# Patient Record
Sex: Female | Born: 1945 | Hispanic: No | Marital: Married | Smoking: Never smoker
Health system: Southern US, Community
[De-identification: ages and names within clinical notes are randomized; demographics above are authoritative.]

## PROBLEM LIST (undated history)

## (undated) DIAGNOSIS — J45909 Unspecified asthma, uncomplicated: Secondary | ICD-10-CM

## (undated) DIAGNOSIS — E119 Type 2 diabetes mellitus without complications: Secondary | ICD-10-CM

## (undated) DIAGNOSIS — E871 Hypo-osmolality and hyponatremia: Secondary | ICD-10-CM

## (undated) DIAGNOSIS — J449 Chronic obstructive pulmonary disease, unspecified: Secondary | ICD-10-CM

## (undated) DIAGNOSIS — D649 Anemia, unspecified: Secondary | ICD-10-CM

## (undated) DIAGNOSIS — I517 Cardiomegaly: Secondary | ICD-10-CM

## (undated) DIAGNOSIS — I10 Essential (primary) hypertension: Secondary | ICD-10-CM

## (undated) HISTORY — DX: Chronic obstructive pulmonary disease, unspecified: J44.9

## (undated) HISTORY — PX: TUBAL LIGATION: SHX77

## (undated) HISTORY — DX: Anemia, unspecified: D64.9

---

## 2012-12-24 ENCOUNTER — Inpatient Hospital Stay: Payer: Self-pay | Admitting: Internal Medicine

## 2012-12-24 LAB — BASIC METABOLIC PANEL
BUN: 32 mg/dL — ABNORMAL HIGH (ref 7–18)
BUN: 32 mg/dL — ABNORMAL HIGH (ref 7–18)
Calcium, Total: 8.5 mg/dL (ref 8.5–10.1)
Calcium, Total: 8 mg/dL — ABNORMAL LOW (ref 8.5–10.1)
Chloride: 91 mmol/L — ABNORMAL LOW (ref 98–107)
Chloride: 90 mmol/L — ABNORMAL LOW (ref 98–107)
Co2: 19 mmol/L — ABNORMAL LOW (ref 21–32)
Creatinine: 1.48 mg/dL — ABNORMAL HIGH (ref 0.60–1.30)
Creatinine: 1.49 mg/dL — ABNORMAL HIGH (ref 0.60–1.30)
EGFR (Non-African Amer.): 36 — ABNORMAL LOW
Potassium: 5.7 mmol/L — ABNORMAL HIGH (ref 3.5–5.1)
Sodium: 119 mmol/L — CL (ref 136–145)
Sodium: 117 mmol/L — CL (ref 136–145)

## 2012-12-24 LAB — CBC
HCT: 35.7 % (ref 35.0–47.0)
HGB: 12.1 g/dL (ref 12.0–16.0)
MCHC: 33.9 g/dL (ref 32.0–36.0)
Platelet: 294 10*3/uL (ref 150–440)
RBC: 4.57 10*6/uL (ref 3.80–5.20)
RDW: 13.8 % (ref 11.5–14.5)
WBC: 11.3 10*3/uL — ABNORMAL HIGH (ref 3.6–11.0)

## 2012-12-24 LAB — CK TOTAL AND CKMB (NOT AT ARMC): CK-MB: 26 ng/mL — ABNORMAL HIGH (ref 0.5–3.6)

## 2012-12-24 LAB — PRO B NATRIURETIC PEPTIDE: B-Type Natriuretic Peptide: 97 pg/mL (ref 0–125)

## 2012-12-25 LAB — CHLORIDE, URINE, RANDOM: Chloride, Urine Random: 30 mmol/L — ABNORMAL LOW (ref 55–125)

## 2012-12-25 LAB — BASIC METABOLIC PANEL
Anion Gap: 8 (ref 7–16)
BUN: 32 mg/dL — ABNORMAL HIGH (ref 7–18)
Creatinine: 1.56 mg/dL — ABNORMAL HIGH (ref 0.60–1.30)
EGFR (African American): 39 — ABNORMAL LOW
EGFR (Non-African Amer.): 34 — ABNORMAL LOW
Potassium: 5.7 mmol/L — ABNORMAL HIGH (ref 3.5–5.1)
Sodium: 118 mmol/L — CL (ref 136–145)

## 2012-12-25 LAB — TSH: Thyroid Stimulating Horm: 1.15 u[IU]/mL

## 2012-12-25 LAB — CK TOTAL AND CKMB (NOT AT ARMC)
CK, Total: 2984 U/L — ABNORMAL HIGH (ref 21–215)
CK-MB: 39.9 ng/mL — ABNORMAL HIGH (ref 0.5–3.6)

## 2012-12-25 LAB — URINALYSIS, COMPLETE
Bilirubin,UR: NEGATIVE
Ketone: NEGATIVE
RBC,UR: <1 /HPF (ref 0–5)
WBC UR: NONE SEEN /HPF (ref 0–5)

## 2012-12-25 LAB — LIPID PANEL
HDL Cholesterol: 26 mg/dL — ABNORMAL LOW (ref 40–60)
Ldl Cholesterol, Calc: 36 mg/dL (ref 0–100)
VLDL Cholesterol, Calc: 29 mg/dL (ref 5–40)

## 2012-12-25 LAB — CBC WITH DIFFERENTIAL/PLATELET
Eosinophil #: 0 10*3/uL (ref 0.0–0.7)
HCT: 29.6 % — ABNORMAL LOW (ref 35.0–47.0)
Lymphocyte %: 11.5 %
MCHC: 32 g/dL (ref 32.0–36.0)
Neutrophil #: 5 10*3/uL (ref 1.4–6.5)
Neutrophil %: 85.9 %
RBC: 3.77 10*6/uL — ABNORMAL LOW (ref 3.80–5.20)

## 2012-12-25 LAB — HEMOGLOBIN A1C: Hemoglobin A1C: 7.9 % — ABNORMAL HIGH (ref 4.2–6.3)

## 2012-12-25 LAB — PROTEIN / CREATININE RATIO, URINE
Creatinine, Urine: 31.2 mg/dL (ref 30.0–125.0)
Protein, Random Urine: 6 mg/dL (ref 0–12)

## 2012-12-25 LAB — CREATININE, URINE, RANDOM: Creatinine, Urine Random: 23.5 mg/dL — ABNORMAL LOW (ref 30.0–125.0)

## 2012-12-25 LAB — TROPONIN I: Troponin-I: <0.02 ng/mL

## 2012-12-25 LAB — SODIUM, URINE, RANDOM: Sodium, Urine Random: 26 mmol/L (ref 20–110)

## 2012-12-26 LAB — BASIC METABOLIC PANEL
BUN: 24 mg/dL — ABNORMAL HIGH (ref 7–18)
Calcium, Total: 6.9 mg/dL — CL (ref 8.5–10.1)
Co2: 16 mmol/L — ABNORMAL LOW (ref 21–32)
EGFR (African American): 56 — ABNORMAL LOW
Glucose: 389 mg/dL — ABNORMAL HIGH (ref 65–99)
Osmolality: 287 (ref 275–301)
Potassium: 4.7 mmol/L (ref 3.5–5.1)
Sodium: 133 mmol/L — ABNORMAL LOW (ref 136–145)

## 2012-12-29 LAB — CULTURE, BLOOD (SINGLE)

## 2012-12-31 LAB — CULTURE, BLOOD (SINGLE)

## 2014-10-01 NOTE — Discharge Summary (Signed)
PATIENT NAMEEEVA, Whitney Fleming MR#:  [id number] DATE OF BIRTH:  06/23/1945  DATE OF ADMISSION:  12/24/2012 DATE OF DISCHARGE:  12/27/2012  DISCHARGE DIAGNOSES:  1. Acute respiratory failure.  2. Acute tracheobronchitis.  3. Hyponatremia.  4. Acute renal failure.  5. Hyperkalemia.  6. Diabetes mellitus.  7. Hypertension.  8. Metabolic acidosis.   CONSULTANTS: Dr. Mortimer Fries of pulmonology and Dr. Candiss Norse of nephrology.   IMAGING STUDIES DONE: Include a CT scan of the chest without contrast showed interstitial fibrosis, mild bronchiectasis in right middle lobe.   Chest x-ray, PA and lateral, showed interstitial markings with COPD. No focal pneumonia.   ADMITTING HISTORY AND PHYSICAL: Please see detailed H and P dictated by me on 12/24/2012. In brief, 69 year old female patient with history of hypertension and diabetes presented to the hospital complaining of shortness of breath. The patient was found to have acute bronchitis with acute respiratory failure. Admitted to the hospitalist service.   HOSPITAL COURSE:  Acute bronchitis. The patient had acute tracheobronchitis. Was started on nebulizers, steroids, oxygen support with which she improved well and by the day of discharge on lung examination, the patient does not have any wheezing and is saturating 94% on room air. With the patient's history of tuberculosis, she had AFB ordered but with no sputum could not obtained. Dr. Mortimer Fries of pulmonology was consulted who saw the patient. Thought tuberculosis was extremely unlikely. A QuantiFERON the test has been ordered as outpatient as this is only done once a week in [facility].   The patient's blood sugar were severely high secondary to IV steroids. She is being transitioned to oral prednisone which should improve her blood sugars. Her home medication was continued. HbA1c was well controlled at 6.9.   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   The patient during her hospital stay, secondary to her concerns with medical insurance, has refused multiple medications. The patient was supposed to get an echocardiogram which she refused on the day of discharge. The patient has requested to be discharged back home. She would like to travel back to [place] as soon as possible and is being discharged home. I have discussed the case with the patient and her daughter at length, to follow up with local physician and they have mentioned they will follow up with Dr. Humphrey Rolls at [facility]. She has been given a lab slip to check a repeat BMP and QuantiFERON in 4 days after discharge.   DISCHARGE MEDICATIONS: Include: 1. Prednisone taper over 12 days.  2. Metformin 500 mg oral 2 times a day.  3. Atorvastatin 20 mg oral once a day.  4. Glimepiride 2 mg oral 2 times a day.  5. Aspirin 81 mg oral once a day.  6. Combivent Respimat 1 puff inhaled 4 times a day.  7. Albuterol 2 puffs inhaled every 4 hours as needed.  8. Levaquin 750 mg oral once a day for 5 days.  9. Lisinopril 10 mg oral once a day.   DISCHARGE INSTRUCTIONS: Low-sodium carbohydrate-controlled diet. Activity as tolerated. Follow up with [facility], Dr. Humphrey Rolls, in 1 week. The patient has been advised to return to the Emergency Room if her breathing worsens or has any fever.   Time spent on day of discharge in discharge activity was 49 minutes.   ____________________________ Leia Alf Dazani Norby, MD srs:gb D: 12/29/2012 16:27:56 ET T: 12/30/2012 04:35:14 ET JOB#:  [id number]  cc: Whitney Codd R. Laylah Riga, MD, <Dictator> Dr. Humphrey Rolls, [facility] Neita Carp MD ELECTRONICALLY SIGNED 12/30/2012 15:44

## 2014-10-01 NOTE — H&P (Signed)
PATIENT NAMEANWITA, Whitney Fleming MR#:  [id number] DATE OF BIRTH:  06/23/1945  DATE OF ADMISSION:  12/24/2012  PRIMARY CARE PHYSICIAN:  None.   HISTORY OF PRESENT ILLNESS:  A 69 year old [place] female patient, who recently traveled here a week back, and history of hypertension, diabetes, cardiomegaly and tuberculosis, presents to the Emergency Room complaining of 3 days of worsening shortness of breath, cough with clear white sputum. The patient has history of TB, was treated for one year in 2006. The patient just felt weak back then, and minimal shortness of breath, and was diagnosed with TB at that time. Presently she feels worse and different from then. She did have a case of severe bronchitis 5 years back, which resolved well. The patient does not have any congestive heart failure. She mentions she had cardiomegaly, chose not to get that treated.   She has significant diaphoresis at this time, complains of some chest tightness on coughing, and pleuritic chest pain in the lower part of the chest.   In the Emergency Room, the patient's chest x-ray shows changes of COPD, bilateral basilar atelectasis versus infiltrate. Sodium is significantly low at 117, creatinine 1.49, potassium 5.5, and patient is being admitted to the hospitalist service.  PAST MEDICAL HISTORY:   1.  Hypertension.  2.  Diabetes mellitus type 2.  3.  Cardiomegaly.  4.  TB.    PAST SURGICAL HISTORY:  None.   FAMILY HISTORY:  Family history of  TB.    SOCIAL HISTORY:  The patient does not smoke. No alcohol. No illicit drugs. Traveled to [place] from [place] one week back. She used to cook on wooden stoves many years back.   CODE STATUS:  FULL CODE.   REVIEW OF SYSTEMS:   CONSTITUTIONAL:  No fever, but complains of fatigue and weakness.  EYES:  No blurred vision, pain or redness.  EARS, NOSE, THROAT:   No tinnitus, ear pain, hearing loss.  RESPIRATORY:  Has cough, white sputum.  CARDIOVASCULAR:  Has pleuritic chest pain. No edema.   GASTROINTESTINAL:  No nausea, vomiting, diarrhea, abdominal pain.  GENITOURINARY:  No dysuria, hematuria, frequency.  ENDOCRINE:  No polyuria, nocturia, thyroid problems. HEMATOLOGY/LYMPHATIC:  No anemia, easy bruising, bleeding.  INTEGUMENTARY:  No acne, rash, lesions.  MUSCULOSKELETAL:  No back pain, arthritis.  NEUROLOGIC:  No focal numbness, weakness, dysarthria, seizures.  PSYCHIATRIC:  No anxiety or depression.   ALLERGIES:  No known drug allergies.   HOME MEDICATIONS INCLUDE: 1.  Atorvastatin 20 mg oral daily. 2.  Glimepiride  2 mg oral once a day.  3.  Hydrochlorothiazide/telmisartan 12.5/40, 1 tablet oral once a day.  4.  Metformin 500 mg oral 2 times a day.   PHYSICAL EXAMINATION: VITAL SIGNS: Temperature 98.3, pulse of 106, respirations 24, blood pressure 127/56, saturating 96% on room air.  GENERAL:  Morbidly obese female patient lying in bed in significant respiratory distress, with 1 to 2 word conversational dyspnea and continuous coughing.  PSYCHIATRIC:  Alert, oriented x 3, anxious.  HEENT: Atraumatic, normocephalic. Oral mucosa dry and pink. No oral ulcers or thrush. External ears and nose normal. No pallor. No icterus. Pupils are  equal and react to light.  NECK:  Supple. No thyromegaly. No palpable lymph nodes. Trachea midline. No carotid bruit, JVD.  CARDIOVASCULAR: S1, S2, tachycardic, without any murmurs. Peripheral pulses 2+. No edema.  RESPIRATORY:  Bilateral coarse inspiratory and expiratory breath sounds with wheezing. Good air entry on both sides.  GASTROINTESTINAL: Soft abdomen, nontender. Bowel sounds  present. No hepatosplenomegaly palpable.  SKIN:  Warm and dry. No petechiae, rash, ulcers.  MUSCULOSKELETAL:  No joint swelling, redness, effusion of the large joints. Normal muscle tone.  NEUROLOGICAL:  Motor strength 5/5 in upper and lower extremities. Sensation is intact all over. Cranial nerves II through XII intact.  LYMPHATIC:  No cervical,  supraclavicular lymphadenopathy.   LAB STUDIES: Show glucose of 200, BNP of 97, BUN 32, creatinine 1.49, sodium 117, potassium 5.5, chloride 90, bicarb of 20, GFR 35. Troponin less than 0.02. WBC 11.3, hemoglobin 12.3, platelets of 294, MCV 78, lactic acid 1.1.   Chest x-ray shows COPD with bilateral basilar atelectasis versus infiltrate. No pulmonary edema.   EKG shows sinus tachycardia at rate of 112, with no ST-T wave changes. Poor R-wave progression.   ASSESSMENT AND PLAN: 1.  Acute bronchitis with significant conversational dyspnea and cough. The patient has had multiple breathing treatments and IV steroids, and still continues to be severely short of breath, is extremely sick and can decompensate. Will admit the patient for IV steroids, nebulizers and antibiotics with Levaquin. Chest x-ray shows some basilar atelectasis versus infiltrate. Will get blood cultures and sputum cultures. The patient does have history of tuberculosis, and AFB smears have been ordered from the Emergency Room. The patient will be in isolation for TB, although there is low suspicion at this time, considering no focal findings on x-ray, no fever, and possibility of bilateral community-acquired pneumonia.   2.  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 The patient seems euvolemic at this time. Will get urine sodium, urine creatinine. Will consult Nephrology for further help. Will also get a CAT scan of the chest, with her history of TB. Likely from her lung issues. Will repeat labs in the morning.   3. Acute renal failure with hyperkalemia. Creatinine baseline is unknown, but considering the hyperkalemia, suspect patient has some acute renal failure with the sepsis. Will start her on gentle normal saline. No EKG changes on the hyperkalemia. Will repeat in the morning. Will also hold her telmisartan, which could be causing the hyperkalemia.   4.  Diabetes mellitus type 2. Continue the glimepiride from home and start sliding scale  insulin.   5.  Deep vein prophylaxis with heparin.   6.  Code status:  FULL CODE.   Time spent today on this case was 55 minutes.     ____________________________ Leia Alf Adrien Dietzman, MD srs:mr D: 12/24/2012 18:35:25 ET T: 12/24/2012 19:05:35 ET JOB#: [id number]  cc: Alveta Heimlich R. Sloane Junkin, MD, <Dictator> Neita Carp MD ELECTRONICALLY SIGNED 12/28/2012 23:42

## 2017-09-03 ENCOUNTER — Encounter: Payer: Self-pay | Admitting: Family Medicine

## 2017-09-03 ENCOUNTER — Ambulatory Visit: Payer: Self-pay | Admitting: Family Medicine

## 2017-09-03 VITALS — BP 152/63 | HR 91 | Temp 97.4°F | Wt 177.5 lb

## 2017-09-03 DIAGNOSIS — L600 Ingrowing nail: Secondary | ICD-10-CM

## 2017-09-03 NOTE — Progress Notes (Signed)
  Patient: Whitney Fleming Female    DOB: 06/23/1945   72 y.o.   MRN: [id number] Visit Date: 09/03/2017  Today's Provider: [facility]   No chief complaint on file.  Subjective:    Whitney Fleming is 72 y/o F with hx of HTN, DM, caridomegaly, TB who presents to clinic with concerns about finger inflammation and problems with respiration  Finger inflammation Pain L middle finger lateral to nail, onset 1 week ago. Mild yellow discharge onset today. no erythema of finger. Subjective fever 2 days ago, which was self-limited. No joint pains or inflammation. Hx of DM, but no hx of poorly healing wounds. Pt reports clipping nails recently before onset of sx.       Allergies not on file Previous Medications   No medications on file    Review of Systems  All other systems reviewed and are negative.   Social History   Tobacco Use  . Smoking status: Not on file  Substance Use Topics  . Alcohol use: Not on file   Objective:   There were no vitals taken for this visit.  Physical Exam  Constitutional: She is oriented to person, place, and time. She appears well-developed and well-nourished.  HENT:  Head: Normocephalic and atraumatic.  Eyes: Pupils are equal, round, and reactive to light. Conjunctivae are normal.  Neck: Normal range of motion.  Cardiovascular: Normal rate, regular rhythm and normal heart sounds. Exam reveals no gallop and no friction rub.  No murmur heard. Pulmonary/Chest: No respiratory distress. She has no wheezes.  CTAB, slightly increased WOB with short shallow breaths.   Musculoskeletal: Normal range of motion. She exhibits no edema.       Hands: Neurological: She is alert and oriented to person, place, and time.  Skin: Skin is warm and dry.  Psychiatric: She has a normal mood and affect.        Assessment & Plan:     Whitney Fleming is a 72 y/o F with DM here for evaluation of finger pain.  Ingrown fingernail On exam, finger pain likely 2/2 to ingrown nail.  No erythema or fever that would be concerning for worsening infection. No capability for resection of nail here in office, so will refer to another GP for nail removal. Recommended symptomatic treatment at home. F/u if worsening of sx, new onset fever, erythema.       [facility]

## 2017-09-06 ENCOUNTER — Telehealth: Payer: Self-pay

## 2017-09-06 NOTE — Telephone Encounter (Signed)
Lm we are still waiting to hear back from dr to see which clinic she can be seen in. We will let them know asap when we have an appt.

## 2017-09-25 ENCOUNTER — Inpatient Hospital Stay
Admission: EM | Admit: 2017-09-25 | Discharge: 2017-09-29 | DRG: 202 | Disposition: A | Discharge status: Home / Self Care | Payer: Medicaid Other | Attending: Internal Medicine | Admitting: Internal Medicine

## 2017-09-25 ENCOUNTER — Emergency Department: Payer: Medicaid Other

## 2017-09-25 ENCOUNTER — Encounter: Payer: Self-pay | Admitting: Emergency Medicine

## 2017-09-25 ENCOUNTER — Other Ambulatory Visit: Payer: Self-pay

## 2017-09-25 DIAGNOSIS — Z8611 Personal history of tuberculosis: Secondary | ICD-10-CM

## 2017-09-25 DIAGNOSIS — I10 Essential (primary) hypertension: Secondary | ICD-10-CM | POA: Diagnosis present

## 2017-09-25 DIAGNOSIS — J45909 Unspecified asthma, uncomplicated: Secondary | ICD-10-CM | POA: Diagnosis present

## 2017-09-25 DIAGNOSIS — R0603 Acute respiratory distress: Secondary | ICD-10-CM

## 2017-09-25 DIAGNOSIS — E871 Hypo-osmolality and hyponatremia: Secondary | ICD-10-CM | POA: Diagnosis present

## 2017-09-25 DIAGNOSIS — J209 Acute bronchitis, unspecified: Secondary | ICD-10-CM | POA: Diagnosis present

## 2017-09-25 DIAGNOSIS — E0821 Diabetes mellitus due to underlying condition with diabetic nephropathy: Secondary | ICD-10-CM

## 2017-09-25 DIAGNOSIS — E119 Type 2 diabetes mellitus without complications: Secondary | ICD-10-CM

## 2017-09-25 DIAGNOSIS — J4551 Severe persistent asthma with (acute) exacerbation: Principal | ICD-10-CM | POA: Diagnosis present

## 2017-09-25 DIAGNOSIS — I272 Pulmonary hypertension, unspecified: Secondary | ICD-10-CM | POA: Diagnosis present

## 2017-09-25 DIAGNOSIS — N179 Acute kidney failure, unspecified: Secondary | ICD-10-CM | POA: Diagnosis present

## 2017-09-25 DIAGNOSIS — E875 Hyperkalemia: Secondary | ICD-10-CM | POA: Diagnosis present

## 2017-09-25 DIAGNOSIS — I152 Hypertension secondary to endocrine disorders: Secondary | ICD-10-CM | POA: Diagnosis present

## 2017-09-25 DIAGNOSIS — E1165 Type 2 diabetes mellitus with hyperglycemia: Secondary | ICD-10-CM | POA: Diagnosis not present

## 2017-09-25 DIAGNOSIS — J9601 Acute respiratory failure with hypoxia: Secondary | ICD-10-CM | POA: Diagnosis present

## 2017-09-25 DIAGNOSIS — E785 Hyperlipidemia, unspecified: Secondary | ICD-10-CM | POA: Diagnosis present

## 2017-09-25 DIAGNOSIS — T380X5A Adverse effect of glucocorticoids and synthetic analogues, initial encounter: Secondary | ICD-10-CM | POA: Diagnosis not present

## 2017-09-25 HISTORY — DX: Essential (primary) hypertension: I10

## 2017-09-25 HISTORY — DX: Cardiomegaly: I51.7

## 2017-09-25 HISTORY — DX: Type 2 diabetes mellitus without complications: E11.9

## 2017-09-25 HISTORY — DX: Unspecified asthma, uncomplicated: J45.909

## 2017-09-25 LAB — CBC WITH DIFFERENTIAL/PLATELET
BASOS ABS: 0.1 10*3/uL (ref 0–0.1)
Basophils Relative: 1 %
Eosinophils Absolute: 0.3 10*3/uL (ref 0–0.7)
Eosinophils Relative: 2 %
HEMATOCRIT: 36.6 % (ref 35.0–47.0)
Hemoglobin: 12.1 g/dL (ref 12.0–16.0)
LYMPHS PCT: 20 %
Lymphs Abs: 2.6 10*3/uL (ref 1.0–3.6)
MCH: 26.7 pg (ref 26.0–34.0)
MCHC: 33.2 g/dL (ref 32.0–36.0)
MCV: 80.6 fL (ref 80.0–100.0)
Monocytes Absolute: 1.7 10*3/uL — ABNORMAL HIGH (ref 0.2–0.9)
Monocytes Relative: 13 %
NEUTROS ABS: 8.6 10*3/uL — AB (ref 1.4–6.5)
Neutrophils Relative %: 64 %
PLATELETS: 256 10*3/uL (ref 150–440)
RBC: 4.54 MIL/uL (ref 3.80–5.20)
RDW: 14.8 % — AB (ref 11.5–14.5)
WBC: 13.3 10*3/uL — AB (ref 3.6–11.0)

## 2017-09-25 LAB — BLOOD GAS, VENOUS
ACID-BASE DEFICIT: 2.2 mmol/L — AB (ref 0.0–2.0)
Bicarbonate: 24.2 mmol/L (ref 20.0–28.0)
FIO2: 0.21
O2 Saturation: 85.4 %
PCO2 VEN: 47 mmHg (ref 44.0–60.0)
PH VEN: 7.32 (ref 7.250–7.430)
Patient temperature: 37
pO2, Ven: 55 mmHg — ABNORMAL HIGH (ref 32.0–45.0)

## 2017-09-25 LAB — LACTIC ACID, PLASMA: LACTIC ACID, VENOUS: 1.3 mmol/L (ref 0.5–1.9)

## 2017-09-25 MED ORDER — IPRATROPIUM-ALBUTEROL 0.5-2.5 (3) MG/3ML IN SOLN
3.0000 mL | Freq: Once | RESPIRATORY_TRACT | Status: AC
  Administered 2017-09-25: 3 mL via RESPIRATORY_TRACT
  Filled 2017-09-25: qty 3

## 2017-09-25 MED ORDER — SODIUM CHLORIDE 0.9 % IV BOLUS
1000.0000 mL | Freq: Once | INTRAVENOUS | Status: AC
  Administered 2017-09-25: 1000 mL via INTRAVENOUS

## 2017-09-25 MED ORDER — IPRATROPIUM-ALBUTEROL 0.5-2.5 (3) MG/3ML IN SOLN
3.0000 mL | Freq: Once | RESPIRATORY_TRACT | Status: AC
  Administered 2017-09-26: 3 mL via RESPIRATORY_TRACT
  Filled 2017-09-25: qty 3

## 2017-09-25 MED ORDER — METHYLPREDNISOLONE SODIUM SUCC 125 MG IJ SOLR
125.0000 mg | Freq: Once | INTRAMUSCULAR | Status: AC
  Administered 2017-09-26: 125 mg via INTRAVENOUS
  Filled 2017-09-25: qty 2

## 2017-09-25 MED ORDER — SODIUM CHLORIDE 0.9 % IV SOLN
500.0000 mg | Freq: Once | INTRAVENOUS | Status: AC
  Administered 2017-09-25: 500 mg via INTRAVENOUS
  Filled 2017-09-25: qty 500

## 2017-09-25 MED ORDER — SODIUM CHLORIDE 0.9 % IV SOLN
1.0000 g | Freq: Once | INTRAVENOUS | Status: AC
  Administered 2017-09-25: 1 g via INTRAVENOUS
  Filled 2017-09-25: qty 10

## 2017-09-25 NOTE — ED Provider Notes (Addendum)
[facility] Emergency Department Provider Note  ____________________________________________   First MD Initiated Contact with Patient 09/25/17 2258     (approximate)  I have reviewed the triage vital signs and the nursing notes.   HISTORY  Chief Complaint Cough  Level 5 exemption history is limited by the patient's clinical condition  HPI Whitney Fleming is a 72 y.o. female who comes to the emergency department with several days of cough and shortness of breath.  According to the patient's daughter the patient has a past medical history of asthma and has had multiple episodes of pneumonia in the past.  She has had increasing cough and shortness of breath for the past 3 days or so.  Past Medical History:  Diagnosis Date  . Asthma   . Cardiomegaly   . Diabetes mellitus without complication ([place])   . Hypertension   . TB (tuberculosis)     Patient Active Problem List   Diagnosis Date Noted  . Asthma exacerbation 09/26/2017  . Acute respiratory failure with hypoxia ([place]) 09/26/2017  . HTN (hypertension) 09/26/2017  . Diabetes ([place]) 09/26/2017      Prior to Admission medications   Not on File    Allergies Patient has no known allergies.  Family History  Problem Relation Age of Onset  . Tuberculosis Other     Social History Social History   Tobacco Use  . Smoking status: Never Smoker  . Smokeless tobacco: Never Used  Substance Use Topics  . Alcohol use: Never    Frequency: Never  . Drug use: Never    Review of Systems Level 5 exemption history limited by the patient's clinical condition ____________________________________________   PHYSICAL EXAM:  VITAL SIGNS: ED Triage Vitals [09/25/17 2250]  Enc Vitals Group     BP (!) 168/87     Pulse Rate (!) 120     Resp (!) 22     Temp 98.5 F (36.9 C)     Temp Source Oral     SpO2 96 %     Weight 187 lb 6.3 oz (85 kg)     Height 5\' 1"  (1.549 m)     Head Circumference      Peak  Flow      Pain Score 0     Pain Loc      Pain Edu?      Excl. in [place]?     Constitutional: Appears critically ill with severe respiratory distress audible breath sounds using accessory muscles and ill-appearing Eyes: PERRL EOMI. Head: Atraumatic. Nose: No congestion/rhinnorhea. Mouth/Throat: No trismus Neck: No stridor.  Able to lie completely flat Cardiovascular: Tachycardic rate, regular rhythm. Grossly normal heart sounds.  Good peripheral circulation. Respiratory: Severe respiratory distress using accessory muscles with rhonchi throughout Gastrointestinal: Obese soft nontender Musculoskeletal: Legs are equal in size Neurologic:  No gross focal neurologic deficits are appreciated. Skin:  Skin is warm, dry and intact. No rash noted. Psychiatric: Anxious appearing    ____________________________________________   DIFFERENTIAL includes but not limited to  Pulmonary edema, pneumothorax, asthma exacerbation, influenza, pulmonary embolism ____________________________________________   LABS (all labs ordered are listed, but only abnormal results are displayed)  Labs Reviewed  COMPREHENSIVE METABOLIC PANEL - Abnormal; Notable for the following components:      Result Value   Sodium 129 (*)    Potassium 5.5 (*)    Chloride 97 (*)    Glucose, Bld 209 (*)    BUN 23 (*)    Creatinine, Ser 1.03 (*)  Calcium 8.6 (*)    GFR calc non Af Amer 53 (*)    All other components within normal limits  CBC WITH DIFFERENTIAL/PLATELET - Abnormal; Notable for the following components:   WBC 13.3 (*)    RDW 14.8 (*)    Neutro Abs 8.6 (*)    Monocytes Absolute 1.7 (*)    All other components within normal limits  BLOOD GAS, VENOUS - Abnormal; Notable for the following components:   pO2, Ven 55.0 (*)    Acid-base deficit 2.2 (*)    All other components within normal limits  CULTURE, BLOOD (ROUTINE X 2)  CULTURE, BLOOD (ROUTINE X 2)  BRAIN NATRIURETIC PEPTIDE  TROPONIN I  LACTIC ACID,  PLASMA  INFLUENZA PANEL BY PCR (TYPE A & B)  URINALYSIS, COMPLETE (UACMP) WITH MICROSCOPIC  LACTIC ACID, PLASMA  PROCALCITONIN    Lab work reviewed by me shows no hypercapnia.  Elevated white count is nonspecific and could be secondary to infection versus stress __________________________________________  EKG  ED ECG REPORT I, Darel Hong, the attending physician, personally viewed and interpreted this ECG.  Date: 09/26/2017 EKG Time:  Rate: 123 Rhythm: normal sinus rhythm QRS Axis: Rightward axis Intervals: normal ST/T Wave abnormalities: normal Narrative Interpretation: no evidence of acute ischemia  ____________________________________________  RADIOLOGY  Chest x-ray reviewed by me with no focal infiltrate and is consistent with chronic pulmonary hypertension ____________________________________________   PROCEDURES  Procedure(s) performed: no  .Critical Care Performed by: Darel Hong, MD Authorized by: Darel Hong, MD   Critical care provider statement:    Critical care time (minutes):  35   Critical care time was exclusive of:  Separately billable procedures and treating other patients   Critical care was necessary to treat or prevent imminent or life-threatening deterioration of the following conditions:  Respiratory failure   Critical care was time spent personally by me on the following activities:  Development of treatment plan with patient or surrogate, discussions with consultants, evaluation of patient's response to treatment, examination of patient, obtaining history from patient or surrogate, ordering and performing treatments and interventions, ordering and review of laboratory studies, ordering and review of radiographic studies, pulse oximetry, re-evaluation of patient's condition and review of old charts    Critical Care performed: Yes  Observation: no ____________________________________________   INITIAL IMPRESSION / ASSESSMENT AND  PLAN / ED COURSE  Pertinent labs & imaging results that were available during my care of the patient were reviewed by me and considered in my medical decision making (see chart for details).  The patient arrives in severe respiratory distress using accessory muscles.  Lung sounds are rhonchorous.  I immediately called respiratory for BiPAP.  I will cover her for community-acquired pneumonia in the meantime and one DuoNeb.  Broad labs including lactic acid and blood cultures are pending.      ___----------------------------------------- 11:57 PM on 09/25/2017 -----------------------------------------  After 30 minutes of BiPAP the patient feels significantly improved although with elevated work of breathing.  Her lungs now sound somewhat wheezy.  Chest x-ray with no focal infiltrate and suggestive only of chronic pulmonary hypertension.  I do think is reasonable to continue covering her with community-acquired antibiotics, however this likely represents more reactive airway disease than anything else.  2 more duo nebs now as well as Solu-Medrol and the patient will require inpatient admission given her BiPAP requirement and need for continued bronchodilation.  I discussed with the patient's daughter at bedside who provided translation and both the patient and daughter  verbalized understanding and agreement with the plan.  I then discussed with the hospitalist Dr. Jannifer Franklin who has graciously agreed to admit the patient to his service.  _________________________________________   FINAL CLINICAL IMPRESSION(S) / ED DIAGNOSES  Final diagnoses:  Acute respiratory distress  Severe persistent asthma with exacerbation  Hyponatremia      NEW MEDICATIONS STARTED DURING THIS VISIT:  New Prescriptions   No medications on file     Note:  This document was prepared using Dragon voice recognition software and may include unintentional dictation errors.     Darel Hong, MD 09/26/17 Ofilia Neas      Darel Hong, MD 09/26/17 [phone]

## 2017-09-25 NOTE — ED Triage Notes (Signed)
Pt presents to ED with wet sounding cough and wheezing for the past couple of days. Frequent cough present in triage. Symptoms worsen with exertion.

## 2017-09-26 ENCOUNTER — Inpatient Hospital Stay (HOSPITAL_COMMUNITY)
Admit: 2017-09-26 | Discharge: 2017-09-26 | Disposition: A | Discharge status: Home / Self Care | Payer: Medicaid Other | Attending: Internal Medicine | Admitting: Internal Medicine

## 2017-09-26 ENCOUNTER — Encounter: Payer: Self-pay | Admitting: Internal Medicine

## 2017-09-26 ENCOUNTER — Other Ambulatory Visit: Payer: Self-pay

## 2017-09-26 DIAGNOSIS — J209 Acute bronchitis, unspecified: Secondary | ICD-10-CM | POA: Diagnosis present

## 2017-09-26 DIAGNOSIS — E785 Hyperlipidemia, unspecified: Secondary | ICD-10-CM | POA: Diagnosis present

## 2017-09-26 DIAGNOSIS — R0603 Acute respiratory distress: Secondary | ICD-10-CM | POA: Diagnosis present

## 2017-09-26 DIAGNOSIS — E1165 Type 2 diabetes mellitus with hyperglycemia: Secondary | ICD-10-CM | POA: Diagnosis not present

## 2017-09-26 DIAGNOSIS — T380X5A Adverse effect of glucocorticoids and synthetic analogues, initial encounter: Secondary | ICD-10-CM | POA: Diagnosis not present

## 2017-09-26 DIAGNOSIS — J4521 Mild intermittent asthma with (acute) exacerbation: Secondary | ICD-10-CM | POA: Diagnosis not present

## 2017-09-26 DIAGNOSIS — E1159 Type 2 diabetes mellitus with other circulatory complications: Secondary | ICD-10-CM | POA: Diagnosis present

## 2017-09-26 DIAGNOSIS — E875 Hyperkalemia: Secondary | ICD-10-CM | POA: Diagnosis present

## 2017-09-26 DIAGNOSIS — E119 Type 2 diabetes mellitus without complications: Secondary | ICD-10-CM

## 2017-09-26 DIAGNOSIS — J4551 Severe persistent asthma with (acute) exacerbation: Secondary | ICD-10-CM | POA: Diagnosis present

## 2017-09-26 DIAGNOSIS — Z8611 Personal history of tuberculosis: Secondary | ICD-10-CM | POA: Diagnosis not present

## 2017-09-26 DIAGNOSIS — I10 Essential (primary) hypertension: Secondary | ICD-10-CM | POA: Diagnosis present

## 2017-09-26 DIAGNOSIS — N179 Acute kidney failure, unspecified: Secondary | ICD-10-CM | POA: Diagnosis present

## 2017-09-26 DIAGNOSIS — I272 Pulmonary hypertension, unspecified: Secondary | ICD-10-CM | POA: Diagnosis present

## 2017-09-26 DIAGNOSIS — J9601 Acute respiratory failure with hypoxia: Secondary | ICD-10-CM | POA: Diagnosis present

## 2017-09-26 DIAGNOSIS — J45909 Unspecified asthma, uncomplicated: Secondary | ICD-10-CM | POA: Diagnosis present

## 2017-09-26 DIAGNOSIS — E871 Hypo-osmolality and hyponatremia: Secondary | ICD-10-CM | POA: Diagnosis present

## 2017-09-26 LAB — COMPREHENSIVE METABOLIC PANEL
ALK PHOS: 49 U/L (ref 38–126)
ALT: 23 U/L (ref 14–54)
ALT: 21 U/L (ref 14–54)
ANION GAP: 5 (ref 5–15)
AST: 34 U/L (ref 15–41)
AST: 27 U/L (ref 15–41)
Albumin: 3.7 g/dL (ref 3.5–5.0)
Albumin: 3.3 g/dL — ABNORMAL LOW (ref 3.5–5.0)
Alkaline Phosphatase: 55 U/L (ref 38–126)
Anion gap: 10 (ref 5–15)
BUN: 23 mg/dL — AB (ref 6–20)
BUN: 21 mg/dL — AB (ref 6–20)
CALCIUM: 7.8 mg/dL — AB (ref 8.9–10.3)
CHLORIDE: 97 mmol/L — AB (ref 101–111)
CO2: 22 mmol/L (ref 22–32)
CO2: 21 mmol/L — ABNORMAL LOW (ref 22–32)
CREATININE: 1.03 mg/dL — AB (ref 0.44–1.00)
Calcium: 8.6 mg/dL — ABNORMAL LOW (ref 8.9–10.3)
Chloride: 102 mmol/L (ref 101–111)
Creatinine, Ser: 0.93 mg/dL (ref 0.44–1.00)
GFR calc Af Amer: >60 mL/min (ref >60)
GFR calc non Af Amer: 53 mL/min — ABNORMAL LOW (ref >60)
GFR, EST NON AFRICAN AMERICAN: 60 mL/min — AB (ref >60)
Glucose, Bld: 209 mg/dL — ABNORMAL HIGH (ref 65–99)
Glucose, Bld: 288 mg/dL — ABNORMAL HIGH (ref 65–99)
POTASSIUM: 6 mmol/L — AB (ref 3.5–5.1)
Potassium: 5.5 mmol/L — ABNORMAL HIGH (ref 3.5–5.1)
SODIUM: 129 mmol/L — AB (ref 135–145)
Sodium: 128 mmol/L — ABNORMAL LOW (ref 135–145)
Total Bilirubin: 0.5 mg/dL (ref 0.3–1.2)
Total Bilirubin: 0.4 mg/dL (ref 0.3–1.2)
Total Protein: 8 g/dL (ref 6.5–8.1)
Total Protein: 6.9 g/dL (ref 6.5–8.1)

## 2017-09-26 LAB — URINALYSIS, COMPLETE (UACMP) WITH MICROSCOPIC
BILIRUBIN URINE: NEGATIVE
GLUCOSE, UA: 50 mg/dL — AB
Hgb urine dipstick: NEGATIVE
KETONES UR: NEGATIVE mg/dL
LEUKOCYTES UA: NEGATIVE
Nitrite: NEGATIVE
Protein, ur: 30 mg/dL — AB
Specific Gravity, Urine: 1.006 (ref 1.005–1.030)
pH: 5 (ref 5.0–8.0)

## 2017-09-26 LAB — GLUCOSE, CAPILLARY
GLUCOSE-CAPILLARY: 222 mg/dL — AB (ref 65–99)
GLUCOSE-CAPILLARY: 326 mg/dL — AB (ref 65–99)
GLUCOSE-CAPILLARY: 251 mg/dL — AB (ref 65–99)
GLUCOSE-CAPILLARY: 203 mg/dL — AB (ref 65–99)
GLUCOSE-CAPILLARY: 159 mg/dL — AB (ref 65–99)
GLUCOSE-CAPILLARY: 113 mg/dL — AB (ref 65–99)
Glucose-Capillary: 259 mg/dL — ABNORMAL HIGH (ref 65–99)
Glucose-Capillary: 284 mg/dL — ABNORMAL HIGH (ref 65–99)
Glucose-Capillary: 265 mg/dL — ABNORMAL HIGH (ref 65–99)
Glucose-Capillary: 282 mg/dL — ABNORMAL HIGH (ref 65–99)
Glucose-Capillary: 282 mg/dL — ABNORMAL HIGH (ref 65–99)
Glucose-Capillary: 113 mg/dL — ABNORMAL HIGH (ref 65–99)
Glucose-Capillary: 360 mg/dL — ABNORMAL HIGH (ref 65–99)

## 2017-09-26 LAB — ECHOCARDIOGRAM COMPLETE
HEIGHTINCHES: 61 in
Weight: 2843.05 oz

## 2017-09-26 LAB — INFLUENZA PANEL BY PCR (TYPE A & B)
INFLAPCR: NEGATIVE
Influenza B By PCR: NEGATIVE

## 2017-09-26 LAB — CBC
HEMATOCRIT: 33 % — AB (ref 35.0–47.0)
Hemoglobin: 11 g/dL — ABNORMAL LOW (ref 12.0–16.0)
MCH: 27.1 pg (ref 26.0–34.0)
MCHC: 33.4 g/dL (ref 32.0–36.0)
MCV: 81.3 fL (ref 80.0–100.0)
PLATELETS: 192 10*3/uL (ref 150–440)
RBC: 4.06 MIL/uL (ref 3.80–5.20)
RDW: 14.8 % — AB (ref 11.5–14.5)
WBC: 6.4 10*3/uL (ref 3.6–11.0)

## 2017-09-26 LAB — PROCALCITONIN: Procalcitonin: <0.1 ng/mL

## 2017-09-26 LAB — POTASSIUM
POTASSIUM: 5.1 mmol/L (ref 3.5–5.1)
Potassium: 5.2 mmol/L — ABNORMAL HIGH (ref 3.5–5.1)
Potassium: 5.4 mmol/L — ABNORMAL HIGH (ref 3.5–5.1)

## 2017-09-26 LAB — BRAIN NATRIURETIC PEPTIDE: B Natriuretic Peptide: 92 pg/mL (ref 0.0–100.0)

## 2017-09-26 LAB — MRSA PCR SCREENING: MRSA BY PCR: NEGATIVE

## 2017-09-26 LAB — TROPONIN I

## 2017-09-26 LAB — LACTIC ACID, PLASMA: Lactic Acid, Venous: 0.9 mmol/L (ref 0.5–1.9)

## 2017-09-26 MED ORDER — INSULIN ASPART 100 UNIT/ML IV SOLN
10.0000 [IU] | Freq: Once | INTRAVENOUS | Status: AC
  Administered 2017-09-26: 10 [IU] via INTRAVENOUS
  Filled 2017-09-26: qty 0.1

## 2017-09-26 MED ORDER — ACETAMINOPHEN 650 MG RE SUPP: 650.0000 mg | Freq: Four times a day (QID) | RECTAL | Status: DC | PRN

## 2017-09-26 MED ORDER — DEXTROSE-NACL 5-0.45 % IV SOLN: INTRAVENOUS | Status: DC

## 2017-09-26 MED ORDER — INSULIN REGULAR BOLUS VIA INFUSION
0.0000 [IU] | Freq: Three times a day (TID) | INTRAVENOUS | Status: DC
  Administered 2017-09-26: 1.4 [IU] via INTRAVENOUS
  Filled 2017-09-26: qty 10

## 2017-09-26 MED ORDER — IPRATROPIUM-ALBUTEROL 0.5-2.5 (3) MG/3ML IN SOLN
3.0000 mL | RESPIRATORY_TRACT | Status: DC
  Administered 2017-09-26 – 2017-09-27 (×8): 3 mL via RESPIRATORY_TRACT
  Filled 2017-09-26 (×9): qty 3

## 2017-09-26 MED ORDER — SODIUM CHLORIDE 0.9 % IV SOLN
INTRAVENOUS | Status: DC
  Administered 2017-09-26: 08:00:00 via INTRAVENOUS

## 2017-09-26 MED ORDER — BUDESONIDE 0.5 MG/2ML IN SUSP
0.5000 mg | Freq: Two times a day (BID) | RESPIRATORY_TRACT | Status: DC
  Administered 2017-09-26 – 2017-09-29 (×6): 0.5 mg via RESPIRATORY_TRACT
  Filled 2017-09-26 (×8): qty 2

## 2017-09-26 MED ORDER — ONDANSETRON HCL 4 MG/2ML IJ SOLN
4.0000 mg | Freq: Four times a day (QID) | INTRAMUSCULAR | Status: DC | PRN
  Administered 2017-09-26 – 2017-09-27 (×2): 4 mg via INTRAVENOUS
  Filled 2017-09-26 (×2): qty 2

## 2017-09-26 MED ORDER — GUAIFENESIN 100 MG/5ML PO SOLN
5.0000 mL | ORAL | Status: DC | PRN
  Administered 2017-09-26: 100 mg via ORAL
  Filled 2017-09-26 (×2): qty 5

## 2017-09-26 MED ORDER — ENOXAPARIN SODIUM 40 MG/0.4ML ~~LOC~~ SOLN: 40.0000 mg | SUBCUTANEOUS | Status: DC

## 2017-09-26 MED ORDER — INSULIN ASPART 100 UNIT/ML ~~LOC~~ SOLN
0.0000 [IU] | SUBCUTANEOUS | Status: DC
  Administered 2017-09-26: 3 [IU] via SUBCUTANEOUS
  Filled 2017-09-26: qty 1

## 2017-09-26 MED ORDER — INSULIN ASPART 100 UNIT/ML ~~LOC~~ SOLN
0.0000 [IU] | Freq: Every day | SUBCUTANEOUS | Status: DC
Start: 2017-09-26 — End: 2017-09-26

## 2017-09-26 MED ORDER — HYDROCOD POLST-CPM POLST ER 10-8 MG/5ML PO SUER: 5.0000 mL | Freq: Two times a day (BID) | ORAL | Status: DC | PRN

## 2017-09-26 MED ORDER — SODIUM CHLORIDE 0.9 % IV SOLN
INTRAVENOUS | Status: DC
  Administered 2017-09-26: 2.7 [IU]/h via INTRAVENOUS
  Filled 2017-09-26 (×2): qty 1

## 2017-09-26 MED ORDER — ALPRAZOLAM 0.5 MG PO TABS
0.5000 mg | ORAL_TABLET | Freq: Every evening | ORAL | Status: DC | PRN
  Administered 2017-09-27: 0.5 mg via ORAL
  Filled 2017-09-26: qty 1

## 2017-09-26 MED ORDER — SODIUM POLYSTYRENE SULFONATE 15 GM/60ML PO SUSP
30.0000 g | Freq: Once | ORAL | Status: AC
  Administered 2017-09-26: 30 g via ORAL
  Filled 2017-09-26: qty 120

## 2017-09-26 MED ORDER — ALUM & MAG HYDROXIDE-SIMETH 200-200-20 MG/5ML PO SUSP
30.0000 mL | ORAL | Status: DC | PRN
  Filled 2017-09-26: qty 30

## 2017-09-26 MED ORDER — INSULIN ASPART 100 UNIT/ML ~~LOC~~ SOLN
0.0000 [IU] | Freq: Three times a day (TID) | SUBCUTANEOUS | Status: DC
  Administered 2017-09-27 (×2): 9 [IU] via SUBCUTANEOUS
  Filled 2017-09-26 (×2): qty 1

## 2017-09-26 MED ORDER — SODIUM CHLORIDE 0.9 % IV SOLN
1.0000 g | INTRAVENOUS | Status: DC
  Filled 2017-09-26: qty 10

## 2017-09-26 MED ORDER — FUROSEMIDE 10 MG/ML IJ SOLN
40.0000 mg | Freq: Once | INTRAMUSCULAR | Status: AC
  Administered 2017-09-26: 40 mg via INTRAVENOUS
  Filled 2017-09-26: qty 4

## 2017-09-26 MED ORDER — IPRATROPIUM-ALBUTEROL 0.5-2.5 (3) MG/3ML IN SOLN
3.0000 mL | RESPIRATORY_TRACT | Status: DC | PRN
  Administered 2017-09-28: 3 mL via RESPIRATORY_TRACT
  Filled 2017-09-26 (×2): qty 3

## 2017-09-26 MED ORDER — BUDESONIDE 0.25 MG/2ML IN SUSP
0.2500 mg | Freq: Two times a day (BID) | RESPIRATORY_TRACT | Status: DC
  Filled 2017-09-26: qty 2

## 2017-09-26 MED ORDER — SODIUM CHLORIDE 0.9 % IV SOLN
500.0000 mg | INTRAVENOUS | Status: DC
  Filled 2017-09-26: qty 500

## 2017-09-26 MED ORDER — SODIUM CHLORIDE 0.9 % IV SOLN
500.0000 mg | INTRAVENOUS | Status: DC
Start: 2017-09-26 — End: 2017-09-26
  Filled 2017-09-26: qty 500

## 2017-09-26 MED ORDER — GUAIFENESIN-CODEINE 100-10 MG/5ML PO SOLN
5.0000 mL | ORAL | Status: DC | PRN
  Administered 2017-09-26 – 2017-09-28 (×7): 5 mL via ORAL
  Filled 2017-09-26 (×7): qty 5

## 2017-09-26 MED ORDER — ONDANSETRON HCL 4 MG PO TABS: 4.0000 mg | ORAL_TABLET | Freq: Four times a day (QID) | ORAL | Status: DC | PRN

## 2017-09-26 MED ORDER — SODIUM CHLORIDE 0.9 % IV SOLN: 500.0000 mg | INTRAVENOUS | Status: DC

## 2017-09-26 MED ORDER — INSULIN ASPART 100 UNIT/ML ~~LOC~~ SOLN
0.0000 [IU] | Freq: Every day | SUBCUTANEOUS | Status: DC
Start: 2017-09-26 — End: 2017-09-27
  Administered 2017-09-26: 5 [IU] via SUBCUTANEOUS
  Filled 2017-09-26: qty 1

## 2017-09-26 MED ORDER — PNEUMOCOCCAL VAC POLYVALENT 25 MCG/0.5ML IJ INJ: 0.5000 mL | INJECTION | INTRAMUSCULAR | Status: DC

## 2017-09-26 MED ORDER — ACETAMINOPHEN 325 MG PO TABS
650.0000 mg | ORAL_TABLET | Freq: Four times a day (QID) | ORAL | Status: DC | PRN
  Administered 2017-09-26: 650 mg via ORAL
  Filled 2017-09-26: qty 2

## 2017-09-26 MED ORDER — METFORMIN HCL 500 MG PO TABS
500.0000 mg | ORAL_TABLET | Freq: Two times a day (BID) | ORAL | Status: DC
  Filled 2017-09-26 (×4): qty 1

## 2017-09-26 MED ORDER — DEXTROSE 50 % IV SOLN: 25.0000 mL | INTRAVENOUS | Status: DC | PRN

## 2017-09-26 MED ORDER — METHYLPREDNISOLONE SODIUM SUCC 40 MG IJ SOLR
40.0000 mg | Freq: Two times a day (BID) | INTRAMUSCULAR | Status: DC
  Administered 2017-09-26 – 2017-09-27 (×2): 40 mg via INTRAVENOUS
  Filled 2017-09-26 (×2): qty 1

## 2017-09-26 MED ORDER — BENZONATATE 100 MG PO CAPS
100.0000 mg | ORAL_CAPSULE | Freq: Three times a day (TID) | ORAL | Status: DC
  Administered 2017-09-26 – 2017-09-29 (×8): 100 mg via ORAL
  Filled 2017-09-26 (×8): qty 1

## 2017-09-26 MED ORDER — ENOXAPARIN SODIUM 40 MG/0.4ML ~~LOC~~ SOLN
40.0000 mg | SUBCUTANEOUS | Status: DC
  Administered 2017-09-26 – 2017-09-28 (×3): 40 mg via SUBCUTANEOUS
  Filled 2017-09-26 (×4): qty 0.4

## 2017-09-26 MED ORDER — DEXTROSE 50 % IV SOLN: 1.0000 | Freq: Once | INTRAVENOUS | Status: DC

## 2017-09-26 MED ORDER — AZITHROMYCIN 250 MG PO TABS
500.0000 mg | ORAL_TABLET | Freq: Every day | ORAL | Status: DC
  Administered 2017-09-26 – 2017-09-28 (×3): 500 mg via ORAL
  Filled 2017-09-26: qty 1
  Filled 2017-09-26 (×2): qty 2

## 2017-09-26 MED ORDER — INSULIN ASPART 100 UNIT/ML ~~LOC~~ SOLN: 0.0000 [IU] | Freq: Four times a day (QID) | SUBCUTANEOUS | Status: DC

## 2017-09-26 MED ORDER — METHYLPREDNISOLONE SODIUM SUCC 125 MG IJ SOLR
60.0000 mg | Freq: Four times a day (QID) | INTRAMUSCULAR | Status: DC
  Administered 2017-09-26: 60 mg via INTRAVENOUS
  Filled 2017-09-26: qty 2

## 2017-09-26 MED ORDER — INSULIN ASPART 100 UNIT/ML ~~LOC~~ SOLN: 0.0000 [IU] | Freq: Three times a day (TID) | SUBCUTANEOUS | Status: DC

## 2017-09-26 NOTE — Progress Notes (Signed)
Pharmacy Antibiotic Note  Whitney Fleming is a 72 y.o. female admitted on 09/25/2017 with pneumonia.  Pharmacy has been consulted for ceftriaxone dosing.  Plan: Ceftriaxone 1 gram q 24 hours ordered.  Height: 5\' 1"  (154.9 cm) Weight: 177 lb 11.1 oz (80.6 kg) IBW/kg (Calculated) : 47.8  Temp (24hrs), Avg:98.5 F (36.9 C), Min:98.5 F (36.9 C), Max:98.5 F (36.9 C)  Recent Labs  Lab 09/25/17 2259 09/25/17 2303 09/26/17 0148  WBC 13.3*  --   --   CREATININE 1.03*  --   --   LATICACIDVEN  --  1.3 0.9    Estimated Creatinine Clearance: 46.8 mL/min (A) (by C-G formula based on SCr of 1.03 mg/dL (H)).    No Known Allergies  Antimicrobials this admission: 4/17 azithromycin, ceftriaxone  >>    >>   Dose adjustments this admission:   Microbiology results: 4/17 BCx: pending  4/18 MRSA PCR: pending   Thank you for allowing pharmacy to be a part of this patient's care.  Keaten Mashek S 09/26/2017 3:32 AM

## 2017-09-26 NOTE — Progress Notes (Signed)
[facility] [place] NAME: Tekla Malachowski    MR#:  [id number]  DATE OF BIRTH:  Jan 29, 1946  SUBJECTIVE:  patient came in with increasing shortness of breath and dry hacking cough with intermittent product to flame. Currently on insulin drip due to elevated sugars. Daughter in the room. There is a lot better. Sets are more than 92% on room air.  REVIEW OF SYSTEMS:   Review of Systems  Constitutional: Negative for chills, fever and weight loss.  HENT: Negative for ear discharge, ear pain and nosebleeds.   Eyes: Negative for blurred vision, pain and discharge.  Respiratory: Positive for cough, sputum production and shortness of breath. Negative for wheezing and stridor.   Cardiovascular: Negative for chest pain, palpitations, orthopnea and PND.  Gastrointestinal: Negative for abdominal pain, diarrhea, nausea and vomiting.  Genitourinary: Negative for frequency and urgency.  Musculoskeletal: Negative for back pain and joint pain.  Neurological: Positive for weakness. Negative for sensory change, speech change and focal weakness.  Psychiatric/Behavioral: Negative for depression and hallucinations. The patient is not nervous/anxious.    Tolerating Diet:yes  DRUG ALLERGIES:  No Known Allergies  VITALS:  Blood pressure (!) 154/82, pulse (!) 122, temperature 98.7 F (37.1 C), temperature source Oral, resp. rate (!) 35, height 5\' 1"  (1.549 m), weight 80.6 kg (177 lb 11.1 oz), SpO2 93 %.  PHYSICAL EXAMINATION:   Physical Exam  GENERAL:  72 y.o.-year-old patient lying in the bed with no acute distress. Obese EYES: Pupils equal, round, reactive to light and accommodation. No scleral icterus. Extraocular muscles intact.  HEENT: Head atraumatic, normocephalic. Oropharynx and nasopharynx clear.  NECK:  Supple, no jugular venous distention. No thyroid enlargement, no tenderness.  LUNGS: distant breath sounds bilaterally, no wheezing, rales, rhonchi.  No use of accessory muscles of respiration.  CARDIOVASCULAR: S1, S2 normal. No murmurs, rubs, or gallops.  ABDOMEN: Soft, nontender, nondistended. Bowel sounds present. No organomegaly or mass.  EXTREMITIES: No cyanosis, clubbing or edema b/l.    NEUROLOGIC: Cranial nerves II through XII are intact. No focal Motor or sensory deficits b/l.   PSYCHIATRIC:  patient is alert and oriented x 3.  SKIN: No obvious rash, lesion, or ulcer.   LABORATORY PANEL:  CBC Recent Labs  Lab 09/26/17 0559  WBC 6.4  HGB 11.0*  HCT 33.0*  PLT 192    Chemistries  Recent Labs  Lab 09/26/17 0559 09/26/17 0959  NA 128*  --   K 6.0* 5.2*  CL 102  --   CO2 21*  --   GLUCOSE 288*  --   BUN 21*  --   CREATININE 0.93  --   CALCIUM 7.8*  --   AST 27  --   ALT 21  --   ALKPHOS 49  --   BILITOT 0.4  --    Cardiac Enzymes Recent Labs  Lab 09/25/17 2259  TROPONINI <0.03   RADIOLOGY:  Dg Chest Portable 1 View  Result Date: 09/25/2017 CLINICAL DATA:  What sounded cough and wheeze for the past several days. EXAM: PORTABLE CHEST 1 VIEW COMPARISON:  12/25/2012 FINDINGS: Mild pulmonary hyperinflation. Prominent pulmonary vasculature compatible with chronic pulmonary hypertension. Heart size is normal with aortic atherosclerosis. There is atelectasis at the lung bases. No effusion or pneumothorax. IMPRESSION: Prominent pulmonary vasculature consistent with chronic pulmonary hypertension, especially in light of hyperinflated lungs. No pulmonary consolidation or CHF. Electronically Signed   By: Ashley Royalty M.D.   On: 09/25/2017 23:28  ASSESSMENT AND PLAN:  Mileigh Tilley  is a 73 y.o. female who presents with progressive cough, shortness of breath and wheezing over the past 3 days.  1. Acute respiratory failure with hypoxia (HCC) -due to asthma, patient's breathing has improved on BiPAP.   -pt now weaned off bipap -sats  .92 5 on rA  2.  Asthma exacerbation -IV steroids, duo nebs.  No pneumonia seen on chest  x-ray -empiric po abxs and steroid taper -prn cough meds  3.  HTN (hypertension) -continue home meds  4.  Diabetes ([place]) -sliding scale insulin with corresponding glucose checks -patient currently is on insulin drip for hypoglycemia secondary to steroids. She is being weaned off. She takes oral metformin, glipizide and one more oral med (meds are from [place]0 She is not keen on taking insulin at home.   Case discussed with Care Management/Social Worker. Management plans discussed with the patient, family and they are in agreement.  CODE STATUS: full DVT Prophylaxis: lovenox  TOTAL TIME TAKING CARE OF THIS PATIENT: *30* minutes.  >50% time spent on counselling and coordination of care with pt and dter  POSSIBLE D/C IN 1-2 DAYS, DEPENDING ON CLINICAL CONDITION.  Note: This dictation was prepared with Dragon dictation along with smaller phrase technology. Any transcriptional errors that result from this process are unintentional.  Fritzi Mandes M.D on 09/26/2017 at 1:46 PM  Between 7am to 6pm - Pager - [phone]  After 6pm go to [email or web address] - password [email or web address]  [facility]  Office  [phone]  CC: Primary care physician; Patient, No Pcp PerPatient ID: [name], female   DOB: 17-Jun-1945, 72 y.o.   MRN: [id number]

## 2017-09-26 NOTE — Progress Notes (Signed)
Per patient, she is receiving her medications from [place]  Med list is as follows; unable to verify doses:   Tenebite M tablets, 1 tab po qam before breakfast  Metformin and Teneligliptin (DPP-4 inhibitor)   Amaryl MV2 tablets, 1 tab po before lunch and 1 tab po before dinner   Glimepiride, Metformin, and Voglibose (alpha-glucosidase inhibitor similar to  Acarbose)  Carca CR 20 tablets, 1 tab po after dinner  Carvedilol 20 mg  Eritel CH 40 tablets, 1 tab po after breakfast  Telmisartan and Chlorthalidone  Zyrova C capsules, 1 cap po after dinner   Rosuvastatin and Clopidogrel  Mashyne tablets, 1 tab po after lunch  Colecalciferol, ferrous ascorbate, folic acid, cyanocobalamin   Rabiprime DSR capsules, as needed ("SOS")  Domperidone (dopamine antagonist, does not cross BBB, antiemetic) and  rabeprazole (PPI)  Volken tablets, 1 tab po after breakfast  Multivitamin ?  Betavert 24 tablets, as needed ("SOS")  Betahistine 24 mg (histamine analog used in Meniere's disease)  Bro Zedex SF   Guaifenesin, terbutaline (beta-2 agonist), and bromhexine  Alerfix M tablets, as needed ("SOS")  Levocetirizine and montelukast   Triohale  Ciclesonide (corticosteroid), formoterol, and tiotropium  Alprax tablets 0.5  Alprazolam 0.5 mg

## 2017-09-26 NOTE — H&P (Signed)
[facility] at [place] NAME: Whitney Fleming    MR#:  [id number]  DATE OF BIRTH:  August 15, 1945  DATE OF ADMISSION:  09/25/2017  PRIMARY CARE PHYSICIAN: Patient, No Pcp Per   REQUESTING/REFERRING PHYSICIAN: Rifenbark, MD  CHIEF COMPLAINT:   Chief Complaint  Patient presents with  . Cough    HISTORY OF PRESENT ILLNESS:  Whitney Fleming  is a 72 y.o. female who presents with progressive cough, shortness of breath and wheezing over the past 3 days.  Patient does not speak English and is unable to contribute much information to her HPI except through family member at bedside serving as interpreter.  Initially when the patient arrived to the ED she was hypoxic, after initial treatment in the ED including BiPAP she is currently breathing much easier.  Workup is consistent with asthma exacerbation.  Hospitalist were called for admission  PAST MEDICAL HISTORY:   Past Medical History:  Diagnosis Date  . Asthma   . Cardiomegaly   . Diabetes mellitus without complication ([place])   . Hypertension   . TB (tuberculosis)      PAST SURGICAL HISTORY:   Past Surgical History:  Procedure Laterality Date  . NO PAST SURGERIES       SOCIAL HISTORY:   Social History   Tobacco Use  . Smoking status: Never Smoker  . Smokeless tobacco: Never Used  Substance Use Topics  . Alcohol use: Never    Frequency: Never     FAMILY HISTORY:   Family History  Problem Relation Age of Onset  . Tuberculosis Other      DRUG ALLERGIES:  No Known Allergies  MEDICATIONS AT HOME:   Prior to Admission medications   Not on File    REVIEW OF SYSTEMS:  Review of Systems  Constitutional: Negative for chills, fever, malaise/fatigue and weight loss.  HENT: Negative for ear pain, hearing loss and tinnitus.   Eyes: Negative for blurred vision, double vision, pain and redness.  Respiratory: Positive for cough, shortness of breath and wheezing. Negative for  hemoptysis.   Cardiovascular: Negative for chest pain, palpitations, orthopnea and leg swelling.  Gastrointestinal: Negative for abdominal pain, constipation, diarrhea, nausea and vomiting.  Genitourinary: Negative for dysuria, frequency and hematuria.  Musculoskeletal: Negative for back pain, joint pain and neck pain.  Skin:       No acne, rash, or lesions  Neurological: Negative for dizziness, tremors, focal weakness and weakness.  Endo/Heme/Allergies: Negative for polydipsia. Does not bruise/bleed easily.  Psychiatric/Behavioral: Negative for depression. The patient is not nervous/anxious and does not have insomnia.      VITAL SIGNS:   Vitals:   09/25/17 2250 09/26/17 0001  BP: (!) 168/87 122/68  Pulse: (!) 120   Resp: (!) 22 (!) 28  Temp: 98.5 F (36.9 C)   TempSrc: Oral   SpO2: 96%   Weight: 85 kg (187 lb 6.3 oz)   Height: 5\' 1"  (1.549 m)    Wt Readings from Last 3 Encounters:  09/25/17 85 kg (187 lb 6.3 oz)  09/03/17 80.5 kg (177 lb 8 oz)    PHYSICAL EXAMINATION:  Physical Exam  Vitals reviewed. Constitutional: She is oriented to person, place, and time. She appears well-developed and well-nourished. No distress.  HENT:  Head: Normocephalic and atraumatic.  Mouth/Throat: Oropharynx is clear and moist.  Eyes: Pupils are equal, round, and reactive to light. Conjunctivae and EOM are normal. No scleral icterus.  Neck: Normal range of motion. Neck  supple. No JVD present. No thyromegaly present.  Cardiovascular: Normal rate, regular rhythm and intact distal pulses. Exam reveals no gallop and no friction rub.  No murmur heard. Respiratory: She is in respiratory distress. She has wheezes. She has no rales.  GI: Soft. Bowel sounds are normal. She exhibits no distension. There is no tenderness.  Musculoskeletal: Normal range of motion. She exhibits no edema.  No arthritis, no gout  Lymphadenopathy:    She has no cervical adenopathy.  Neurological: She is alert and oriented  to person, place, and time. No cranial nerve deficit.  No dysarthria, no aphasia  Skin: Skin is warm and dry. No rash noted. No erythema.  Psychiatric: She has a normal mood and affect. Her behavior is normal. Judgment and thought content normal.    LABORATORY PANEL:   CBC Recent Labs  Lab 09/25/17 2259  WBC 13.3*  HGB 12.1  HCT 36.6  PLT 256   ------------------------------------------------------------------------------------------------------------------  Chemistries  Recent Labs  Lab 09/25/17 2259  NA 129*  K 5.5*  CL 97*  CO2 22  GLUCOSE 209*  BUN 23*  CREATININE 1.03*  CALCIUM 8.6*  AST 34  ALT 23  ALKPHOS 55  BILITOT 0.5   ------------------------------------------------------------------------------------------------------------------  Cardiac Enzymes Recent Labs  Lab 09/25/17 2259  TROPONINI <0.03   ------------------------------------------------------------------------------------------------------------------  RADIOLOGY:  Dg Chest Portable 1 View  Result Date: 09/25/2017 CLINICAL DATA:  What sounded cough and wheeze for the past several days. EXAM: PORTABLE CHEST 1 VIEW COMPARISON:  12/25/2012 FINDINGS: Mild pulmonary hyperinflation. Prominent pulmonary vasculature compatible with chronic pulmonary hypertension. Heart size is normal with aortic atherosclerosis. There is atelectasis at the lung bases. No effusion or pneumothorax. IMPRESSION: Prominent pulmonary vasculature consistent with chronic pulmonary hypertension, especially in light of hyperinflated lungs. No pulmonary consolidation or CHF. Electronically Signed   By: Ashley Royalty M.D.   On: 09/25/2017 23:28    EKG:   Orders placed or performed during the hospital encounter of 09/25/17  . ED EKG  . ED EKG  . EKG 12-Lead  . EKG 12-Lead    IMPRESSION AND PLAN:  Principal Problem:   Acute respiratory failure with hypoxia (HCC) -due to asthma, patient's breathing has improved on BiPAP.  She  will need to remain on BiPAP for now to ease her work of breathing, and can be weaned off as tolerated Active Problems:   Asthma exacerbation -IV steroids, duo nebs.  No pneumonia seen on chest x-ray   HTN (hypertension) -continue home meds   Diabetes ([place]) -sliding scale insulin with corresponding glucose checks  Chart review performed and case discussed with ED provider. Labs, imaging and/or ECG reviewed by provider and discussed with patient/family. Management plans discussed with the patient and/or family.  DVT PROPHYLAXIS: SubQ lovenox  GI PROPHYLAXIS: None  ADMISSION STATUS: Inpatient  CODE STATUS: Full  TOTAL TIME TAKING CARE OF THIS PATIENT: 50 minutes.   Eileene Kisling [place] 09/26/2017, 12:36 AM  [facility]  Office  [phone]  CC: Primary care physician; Patient, No Pcp Per  Note:  This document was prepared using Dragon voice recognition software and may include unintentional dictation errors.

## 2017-09-26 NOTE — Progress Notes (Signed)
PHARMACIST - PHYSICIAN COMMUNICATION  CONCERNING: Antibiotic IV to Oral Route Change Policy  RECOMMENDATION: This patient is receiving azithromycin by the intravenous route.  Based on criteria approved by the Pharmacy and Therapeutics Committee, the antibiotic(s) is/are being converted to the equivalent oral dose form(s).   DESCRIPTION: These criteria include:  Patient being treated for a respiratory tract infection, urinary tract infection, cellulitis or clostridium difficile associated diarrhea if on metronidazole  The patient is not neutropenic and does not exhibit a GI malabsorption state  The patient is eating (either orally or via tube) and/or has been taking other orally administered medications for a least 24 hours  The patient is improving clinically and has a Tmax < 100.5  If you have questions about this conversion, please contact the Pharmacy Department  []   [phone] )  [facility] [x]   [phone] )  [facility] []   [phone] )  [facility] []   [phone] )  [facility] []   [phone] )  [facility]   MLS  04.18.2019 1250

## 2017-09-26 NOTE — Progress Notes (Signed)
   09/26/17 1000  Clinical Encounter Type  Visited With Patient and family together  Visit Type Initial  Referral From Nurse  Consult/Referral To Chaplain  Spiritual Encounters  Spiritual Needs Emotional   CH received an OR to educate PT on AD. PT is currently unable to understand what an AD is. Family member stated that she didn't think it was necessary. Dix informed family to have nurse PG Bradshaw if needed.

## 2017-09-26 NOTE — Consult Note (Signed)
Name: Whitney Fleming MRN: [id number] DOB: 09/10/1945    ADMISSION DATE:  09/25/2017 CONSULTATION DATE: 09/25/2017  REFERRING MD : Dr. Jannifer Franklin   CHIEF COMPLAINT: Cough   BRIEF PATIENT DESCRIPTION:  72 yo female admitted with acute renal failure with hyperkalemia acute on chronic respiratory failure secondary to asthma exacerbation and pneumonia vs. URI requiring Bipap  SIGNIFICANT EVENTS  04/18-Pt admitted to stepdown unit   STUDIES:  None   HISTORY OF PRESENT ILLNESS:   This is a 72 yo female with a PMH of Tuberculosis, HTN, Diabetes Mellitus, Cardiomegaly, and Asthma.  She presented to [facility] ER on 04/17 with cough that started 3 days ago and worsening shortness of breath that started today.  She traveled to [place] several weeks ago, however she denies sick contacts, night sweats, fever, chills, or hemoptysis. In the ER pt in severe respiratory distress using accessory muscles to breath with rhoncorous breath sounds, therefore she was placed on Bipap.  CXR negative for pneumonia and Influenza PCR negative.  Lab results revealed Na+ 129, K+ 5.5, serum glucose 209, creatinine 1.03, BUN 23, lactic 1.3, PCT <0.10, and wbc 13.3.  She received iv abx and was subsequently admitted to the stepdown unit by hospitalist team for further workup and treatment.   PAST MEDICAL HISTORY :   has a past medical history of Asthma, Cardiomegaly, Diabetes mellitus without complication ([place]), Hypertension, and TB (tuberculosis).  has a past surgical history that includes No past surgeries. Prior to Admission medications   Not on File   No Known Allergies  FAMILY HISTORY:  family history includes Tuberculosis in her other. SOCIAL HISTORY:  reports that she has never smoked. She has never used smokeless tobacco. She reports that she does not drink alcohol or use drugs.  REVIEW OF SYSTEMS: Positives in BOLD  Constitutional: Negative for fever, chills, weight loss, malaise/fatigue and diaphoresis.  HENT:  Negative for hearing loss, ear pain, nosebleeds, congestion, sore throat, neck pain, tinnitus and ear discharge.   Eyes: Negative for blurred vision, double vision, photophobia, pain, discharge and redness.  Respiratory: cough, hemoptysis, sputum production, shortness of breath, wheezing and stridor.   Cardiovascular: Negative for chest pain, palpitations, orthopnea, claudication, leg swelling and PND.  Gastrointestinal: Negative for heartburn, nausea, vomiting, abdominal pain, diarrhea, constipation, blood in stool and melena.  Genitourinary: Negative for dysuria, urgency, frequency, hematuria and flank pain.  Musculoskeletal: Negative for myalgias, back pain, joint pain and falls.  Skin: Negative for itching and rash.  Neurological: Negative for dizziness, tingling, tremors, sensory change, speech change, focal weakness, seizures, loss of consciousness, weakness and headaches.  Endo/Heme/Allergies: Negative for environmental allergies and polydipsia. Does not bruise/bleed easily.  SUBJECTIVE:  Pt asking for Bipap mask to be removed  VITAL SIGNS: Temp:  [98.5 F (36.9 C)] 98.5 F (36.9 C) (04/17 2250) Pulse Rate:  [110-120] 110 (04/18 0100) Resp:  [22-28] 26 (04/18 0100) BP: (122-168)/(67-87) 133/76 (04/18 0100) SpO2:  [96 %-100 %] 100 % (04/18 0100) Weight:  [80.6 kg (177 lb 11.1 oz)-85 kg (187 lb 6.3 oz)] 80.6 kg (177 lb 11.1 oz) (04/18 0152)  PHYSICAL EXAMINATION: General: well developed, well nourished female, NAD on Bipap Neuro: alert and oriented, follows commands  HEENT: supple, no JVD Cardiovascular: sinus tach, no R/G Lungs: rhonchi throughout, even, non labored  Abdomen: +BS x4, soft, obese, non tender, non distended  Musculoskeletal: normal bulk and tone, no edema  Skin: intact no rashes or lesions   Recent Labs  Lab 09/25/17 2259  NA  129*  K 5.5*  CL 97*  CO2 22  BUN 23*  CREATININE 1.03*  GLUCOSE 209*   Recent Labs  Lab 09/25/17 2259  HGB 12.1  HCT 36.6    WBC 13.3*  PLT 256   Dg Chest Portable 1 View  Result Date: 09/25/2017 CLINICAL DATA:  What sounded cough and wheeze for the past several days. EXAM: PORTABLE CHEST 1 VIEW COMPARISON:  12/25/2012 FINDINGS: Mild pulmonary hyperinflation. Prominent pulmonary vasculature compatible with chronic pulmonary hypertension. Heart size is normal with aortic atherosclerosis. There is atelectasis at the lung bases. No effusion or pneumothorax. IMPRESSION: Prominent pulmonary vasculature consistent with chronic pulmonary hypertension, especially in light of hyperinflated lungs. No pulmonary consolidation or CHF. Electronically Signed   By: Ashley Royalty M.D.   On: 09/25/2017 23:28    ASSESSMENT / PLAN: Acute on chronic respiratory failure secondary to asthma exacerbation and pneumonia vs. URI  Acute renal failure with hyperkalemia  Hyponatremia  Hx: Diabetes Mellitus, Tuberculosis, and HTN  P: Prn Bipap for dyspnea and/or hypoxia Scheduled and prn bronchodilator therapy IV and nebulized steroids Trend WBC and monitor fever curve Trend PCT  Follow cultures  Continue empiric abx for now  Trend BMP  Replace electrolytes as indicated  Monitor UOP VTE px: subq lovenox Trend CBC  Monitor for s/sx of bleeding  Transfuse for hgb <7 SSI   Marda Stalker, [place] Pager [phone] (please enter 7 digits) PCCM Consult Pager [phone] (please enter 7 digits)

## 2017-09-26 NOTE — Progress Notes (Signed)
Patient's admission assessment completed with the use of an interpretor([id number])

## 2017-09-26 NOTE — Progress Notes (Signed)
*  PRELIMINARY RESULTS* Echocardiogram 2D Echocardiogram has been performed.  Whitney Fleming 09/26/2017, 2:57 PM

## 2017-09-26 NOTE — Clinical Social Work Note (Signed)
CSW consulted for providing information on applying for medicare/medicaid. Patient will be followed by Patient Financial Services while in hospital in order to address the insurance questions. Shela Leff MSW,LCSW [phone]

## 2017-09-27 DIAGNOSIS — J4521 Mild intermittent asthma with (acute) exacerbation: Secondary | ICD-10-CM

## 2017-09-27 LAB — GLUCOSE, CAPILLARY
GLUCOSE-CAPILLARY: 358 mg/dL — AB (ref 65–99)
Glucose-Capillary: 431 mg/dL — ABNORMAL HIGH (ref 65–99)
Glucose-Capillary: 412 mg/dL — ABNORMAL HIGH (ref 65–99)
Glucose-Capillary: 307 mg/dL — ABNORMAL HIGH (ref 65–99)

## 2017-09-27 LAB — BASIC METABOLIC PANEL
Anion gap: 9 (ref 5–15)
BUN: 26 mg/dL — AB (ref 6–20)
CHLORIDE: 98 mmol/L — AB (ref 101–111)
CO2: 25 mmol/L (ref 22–32)
CREATININE: 1.01 mg/dL — AB (ref 0.44–1.00)
Calcium: 7.9 mg/dL — ABNORMAL LOW (ref 8.9–10.3)
GFR calc Af Amer: >60 mL/min (ref >60)
GFR, EST NON AFRICAN AMERICAN: 54 mL/min — AB (ref >60)
Glucose, Bld: 369 mg/dL — ABNORMAL HIGH (ref 65–99)
Potassium: 4.6 mmol/L (ref 3.5–5.1)
SODIUM: 132 mmol/L — AB (ref 135–145)

## 2017-09-27 LAB — HEMOGLOBIN A1C
HEMOGLOBIN A1C: 8.1 % — AB (ref 4.8–5.6)
Mean Plasma Glucose: 185.77 mg/dL

## 2017-09-27 LAB — PHOSPHORUS: PHOSPHORUS: 3.9 mg/dL (ref 2.5–4.6)

## 2017-09-27 LAB — MAGNESIUM: Magnesium: 1.5 mg/dL — ABNORMAL LOW (ref 1.7–2.4)

## 2017-09-27 MED ORDER — INSULIN ASPART 100 UNIT/ML ~~LOC~~ SOLN
0.0000 [IU] | Freq: Three times a day (TID) | SUBCUTANEOUS | Status: DC
  Administered 2017-09-27: 15 [IU] via SUBCUTANEOUS
  Administered 2017-09-28: 8 [IU] via SUBCUTANEOUS
  Administered 2017-09-28: 5 [IU] via SUBCUTANEOUS
  Administered 2017-09-28: 15 [IU] via SUBCUTANEOUS
  Administered 2017-09-29: 5 [IU] via SUBCUTANEOUS
  Filled 2017-09-27 (×5): qty 1

## 2017-09-27 MED ORDER — INSULIN ASPART 100 UNIT/ML ~~LOC~~ SOLN
5.0000 [IU] | Freq: Once | SUBCUTANEOUS | Status: AC
  Administered 2017-09-27: 5 [IU] via SUBCUTANEOUS
  Filled 2017-09-27: qty 1

## 2017-09-27 MED ORDER — LEVALBUTEROL HCL 1.25 MG/0.5ML IN NEBU
1.2500 mg | INHALATION_SOLUTION | Freq: Three times a day (TID) | RESPIRATORY_TRACT | Status: DC
  Filled 2017-09-27 (×2): qty 0.5

## 2017-09-27 MED ORDER — LEVALBUTEROL HCL 1.25 MG/0.5ML IN NEBU
1.2500 mg | INHALATION_SOLUTION | Freq: Three times a day (TID) | RESPIRATORY_TRACT | Status: DC
  Administered 2017-09-27 – 2017-09-29 (×5): 1.25 mg via RESPIRATORY_TRACT
  Filled 2017-09-27 (×6): qty 0.5

## 2017-09-27 MED ORDER — INSULIN ASPART 100 UNIT/ML ~~LOC~~ SOLN
11.0000 [IU] | Freq: Once | SUBCUTANEOUS | Status: AC
  Administered 2017-09-27: 11 [IU] via SUBCUTANEOUS

## 2017-09-27 MED ORDER — INSULIN ASPART 100 UNIT/ML ~~LOC~~ SOLN
0.0000 [IU] | Freq: Every day | SUBCUTANEOUS | Status: DC
  Administered 2017-09-27 – 2017-09-28 (×2): 4 [IU] via SUBCUTANEOUS
  Filled 2017-09-27 (×2): qty 1

## 2017-09-27 MED ORDER — METFORMIN HCL 500 MG PO TABS
1000.0000 mg | ORAL_TABLET | Freq: Two times a day (BID) | ORAL | Status: DC
  Administered 2017-09-27 – 2017-09-29 (×4): 1000 mg via ORAL
  Filled 2017-09-27 (×4): qty 2

## 2017-09-27 MED ORDER — MAGNESIUM SULFATE 2 GM/50ML IV SOLN
2.0000 g | Freq: Once | INTRAVENOUS | Status: AC
  Administered 2017-09-27: 2 g via INTRAVENOUS
  Filled 2017-09-27: qty 50

## 2017-09-27 MED ORDER — DILTIAZEM HCL 30 MG PO TABS
30.0000 mg | ORAL_TABLET | Freq: Four times a day (QID) | ORAL | Status: DC
  Administered 2017-09-27 – 2017-09-29 (×8): 30 mg via ORAL
  Filled 2017-09-27 (×9): qty 1

## 2017-09-27 MED ORDER — GLIPIZIDE 10 MG PO TABS
10.0000 mg | ORAL_TABLET | Freq: Every day | ORAL | Status: DC
  Administered 2017-09-27 – 2017-09-29 (×3): 10 mg via ORAL
  Filled 2017-09-27 (×3): qty 1

## 2017-09-27 MED ORDER — PREDNISONE 50 MG PO TABS
50.0000 mg | ORAL_TABLET | Freq: Every day | ORAL | Status: DC
  Administered 2017-09-28 – 2017-09-29 (×2): 50 mg via ORAL
  Filled 2017-09-27 (×2): qty 1

## 2017-09-27 NOTE — Plan of Care (Signed)
  Problem: Spiritual Needs Goal: Ability to function at adequate level Outcome: Progressing   Problem: Education: Goal: Knowledge of General Education information will improve Outcome: Progressing   Problem: Health Behavior/Discharge Planning: Goal: Ability to manage health-related needs will improve Outcome: Progressing

## 2017-09-27 NOTE — Progress Notes (Addendum)
Inpatient Diabetes Program Recommendations  AACE/ADA: New Consensus Statement on Inpatient Glycemic Control (2015)  Target Ranges:  Prepandial:   less than 140 mg/dL      Peak postprandial:   less than 180 mg/dL (1-2 hours)      Critically ill patients:  140 - 180 mg/dL   Lab Results  Component Value Date   GLUCAP 358 (H) 09/27/2017   HGBA1C 7.9 (H) 12/25/2012    Review of Glycemic Control Results for Whitney Fleming, Whitney Fleming (MRN [id number]) as of 09/27/2017 09:24  Ref. Range 09/26/2017 17:32 09/26/2017 21:37 09/27/2017 07:24  Glucose-Capillary Latest Ref Range: 65 - 99 mg/dL 113 (H) 360 (H) 358 (H)   Diabetes history: DM2 Outpatient Diabetes medications: No home meds Current orders for Inpatient glycemic control: Glipizide 10 qd + Metformin 1 gm bid + Novolog sensitive correction tid + hs  Inpatient Diabetes Program Recommendations:   -Increase Novolog correction to moderate -Lantus 16 units qd while in hospital on steroids  Thank you, Bethena Roys E. Tykerria Mccubbins, RN, MSN, CDE  Diabetes Coordinator Inpatient Glycemic Control Team Team Pager [phone] (8am-5pm) 09/27/2017 9:33 AM

## 2017-09-27 NOTE — Progress Notes (Signed)
CRITICAL VALUE ALERT  Critical Value:  CBG 431  Date & Time Notied:  04/19/191225  Provider Notified: Dr. Ulysees Barns  Orders Received/Actions taken: 20 units novolog

## 2017-09-27 NOTE — Progress Notes (Signed)
[place] at [place] NAME: Whitney Fleming    MR#:  [id number]  DATE OF BIRTH:  Aug 30, 1945  SUBJECTIVE:  Patient is breathing is improved However blood sugars elevated due to steroids  REVIEW OF SYSTEMS:   Review of Systems  Constitutional: Negative for chills, fever and weight loss.  HENT: Negative for ear discharge, ear pain and nosebleeds.   Eyes: Negative for blurred vision, pain and discharge.  Respiratory: Positive for cough, sputum production and shortness of breath. Negative for wheezing and stridor.   Cardiovascular: Negative for chest pain, palpitations, orthopnea and PND.  Gastrointestinal: Negative for abdominal pain, diarrhea, nausea and vomiting.  Genitourinary: Negative for frequency and urgency.  Musculoskeletal: Negative for back pain and joint pain.  Neurological: Positive for weakness. Negative for sensory change, speech change and focal weakness.  Psychiatric/Behavioral: Negative for depression and hallucinations. The patient is not nervous/anxious.    Tolerating Diet:yes  DRUG ALLERGIES:  No Known Allergies  VITALS:  Blood pressure 130/67, pulse (!) 119, temperature 97.9 F (36.6 C), resp. rate 18, height 5\' 1"  (1.549 m), weight 80.6 kg (177 lb 11.1 oz), SpO2 95 %.  PHYSICAL EXAMINATION:   Physical Exam  GENERAL:  72 y.o.-year-old patient lying in the bed with no acute distress. Obese EYES: Pupils equal, round, reactive to light and accommodation. No scleral icterus. Extraocular muscles intact.  HEENT: Head atraumatic, normocephalic. Oropharynx and nasopharynx clear.  NECK:  Supple, no jugular venous distention. No thyroid enlargement, no tenderness.  LUNGS: distant breath sounds bilaterally, no wheezing, rales, rhonchi. No use of accessory muscles of respiration.  CARDIOVASCULAR: S1, S2 normal. No murmurs, rubs, or gallops.  ABDOMEN: Soft, nontender, nondistended. Bowel sounds present. No organomegaly or  mass.  EXTREMITIES: No cyanosis, clubbing or edema b/l.    NEUROLOGIC: Cranial nerves II through XII are intact. No focal Motor or sensory deficits b/l.   PSYCHIATRIC:  patient is alert and oriented x 3.  SKIN: No obvious rash, lesion, or ulcer.   LABORATORY PANEL:  CBC Recent Labs  Lab 09/26/17 0559  WBC 6.4  HGB 11.0*  HCT 33.0*  PLT 192    Chemistries  Recent Labs  Lab 09/26/17 0559  09/27/17 0433  NA 128*  --  132*  K 6.0*   < > 4.6  CL 102  --  98*  CO2 21*  --  25  GLUCOSE 288*  --  369*  BUN 21*  --  26*  CREATININE 0.93  --  1.01*  CALCIUM 7.8*  --  7.9*  MG  --   --  1.5*  AST 27  --   --   ALT 21  --   --   ALKPHOS 49  --   --   BILITOT 0.4  --   --    < > = values in this interval not displayed.   Cardiac Enzymes Recent Labs  Lab 09/25/17 2259  TROPONINI <0.03   RADIOLOGY:  Dg Chest Portable 1 View  Result Date: 09/25/2017 CLINICAL DATA:  What sounded cough and wheeze for the past several days. EXAM: PORTABLE CHEST 1 VIEW COMPARISON:  12/25/2012 FINDINGS: Mild pulmonary hyperinflation. Prominent pulmonary vasculature compatible with chronic pulmonary hypertension. Heart size is normal with aortic atherosclerosis. There is atelectasis at the lung bases. No effusion or pneumothorax. IMPRESSION: Prominent pulmonary vasculature consistent with chronic pulmonary hypertension, especially in light of hyperinflated lungs. No pulmonary consolidation or CHF. Electronically Signed   By:  Ashley Royalty M.D.   On: 09/25/2017 23:28   ASSESSMENT AND PLAN:  Whitney Fleming  is a 72 y.o. female who presents with progressive cough, shortness of breath and wheezing over the past 3 days.  1. Acute respiratory failure with hypoxia (HCC) -due to asthma, patient's breathing has improved  2.  Asthma exacerbation -continue IV steroids, continue duo nebs.   -empiric po abxs and steroid taper -prn cough meds   3.  HTN (hypertension) -continue home meds  4.  Diabetes ([place]) -sliding  scale insulin with corresponding glucose checks -Still elevated due to steroid -I have doubled patient's metformin have also started patient on glipizide  5.  Miscellaneous Lovenox for DVT proph  Case discussed with Care Management/Social Worker. Management plans discussed with the patient, family and they are in agreement.  CODE STATUS: full DVT Prophylaxis: lovenox  TOTAL TIME TAKING CARE OF THIS PATIENT: 30 minutes.  >50% time spent on counselling and coordination of care with pt and dter  POSSIBLE D/C IN 1-2 DAYS, DEPENDING ON CLINICAL CONDITION.  Note: This dictation was prepared with Dragon dictation along with smaller phrase technology. Any transcriptional errors that result from this process are unintentional.  Dustin Flock M.D on 09/27/2017 at 2:55 PM  Between 7am to 6pm - Pager - [phone]  After 6pm go to [email or web address] - password [email or web address]  [facility]  Office  [phone]  CC: Primary care physician; Patient, No Pcp PerPatient ID: Whitney Fleming, female   DOB: Sep 21, 1945, 72 y.o.   MRN: [id number]

## 2017-09-27 NOTE — Consult Note (Signed)
Name: Whitney Fleming MRN: [id number] DOB: 06/04/1946    ADMISSION DATE:  09/25/2017 CONSULTATION DATE: 09/25/2017  REFERRING MD : Dr. Jannifer Franklin   CHIEF COMPLAINT: Cough   BRIEF PATIENT DESCRIPTION:  72 yo female admitted with acute renal failure with hyperkalemia acute on chronic respiratory failure secondary to asthma exacerbation and pneumonia vs. URI requiring Bipap  SIGNIFICANT EVENTS  04/18-Pt admitted to stepdown unit   STUDIES:  None   HISTORY OF PRESENT ILLNESS:   This is a 72 yo female with a PMH of Tuberculosis, HTN, Diabetes Mellitus, Cardiomegaly, and Asthma.  She presented to [facility] ER on 04/17 with cough that started 3 days ago and worsening shortness of breath that started today.  She traveled to [place] several weeks ago, however she denies sick contacts, night sweats, fever, chills, or hemoptysis. In the ER pt in severe respiratory distress using accessory muscles to breath with rhoncorous breath sounds, therefore she was placed on Bipap.  CXR negative for pneumonia and Influenza PCR negative.  Lab results revealed Na+ 129, K+ 5.5, serum glucose 209, creatinine 1.03, BUN 23, lactic 1.3, PCT <0.10, and wbc 13.3.  She received iv abx and was subsequently admitted to the stepdown unit by hospitalist team for further workup and treatment.   SUBJECTIVE resp status improved K improved Less wheezing today sugars still elevated Off of biPAP No pain +nausea Daughter at bedside updated  REVIEW OF SYSTEMS: Positives in BOLD  Constitutional: Negative for fever, chills, weight loss, malaise/fatigue and diaphoresis.  HENT: Negative for hearing loss, ear pain, nosebleeds, congestion, sore throat, neck pain, tinnitus and ear discharge.   Eyes: Negative for blurred vision, double vision, photophobia, pain, discharge and redness.  Respiratory: cough, hemoptysis, sputum production, shortness of breath-improvedr.   All other ORS negative   VITAL SIGNS: Temp:  [97.4 F (36.3 C)-98.7 F  (37.1 C)] 97.4 F (36.3 C) (04/19 0155) Pulse Rate:  [98-134] 134 (04/19 0420) Resp:  [18-35] 23 (04/19 0420) BP: (125-154)/(56-90) 125/90 (04/19 0420) SpO2:  [93 %-99 %] 98 % (04/19 0420)  PHYSICAL EXAMINATION: General: well developed, well nourished female, NAD on Bipap Neuro: alert and oriented, follows commands  HEENT: supple, no JVD Cardiovascular: sinus tach, no R/G Lungs: rhonchi throughout, even, non labored  Abdomen: +BS x4, soft, obese, non tender, non distended  Musculoskeletal: normal bulk and tone, no edema  Skin: intact no rashes or lesions   Recent Labs  Lab 09/25/17 2259 09/26/17 0559  09/26/17 1611 09/26/17 2234 09/27/17 0433  NA 129* 128*  --   --   --  132*  K 5.5* 6.0*   < > 5.4* 5.1 4.6  CL 97* 102  --   --   --  98*  CO2 22 21*  --   --   --  25  BUN 23* 21*  --   --   --  26*  CREATININE 1.03* 0.93  --   --   --  1.01*  GLUCOSE 209* 288*  --   --   --  369*   < > = values in this interval not displayed.   Recent Labs  Lab 09/25/17 2259 09/26/17 0559  HGB 12.1 11.0*  HCT 36.6 33.0*  WBC 13.3* 6.4  PLT 256 192   Dg Chest Portable 1 View  Result Date: 09/25/2017 CLINICAL DATA:  What sounded cough and wheeze for the past several days. EXAM: PORTABLE CHEST 1 VIEW COMPARISON:  12/25/2012 FINDINGS: Mild pulmonary hyperinflation. Prominent pulmonary vasculature compatible  with chronic pulmonary hypertension. Heart size is normal with aortic atherosclerosis. There is atelectasis at the lung bases. No effusion or pneumothorax. IMPRESSION: Prominent pulmonary vasculature consistent with chronic pulmonary hypertension, especially in light of hyperinflated lungs. No pulmonary consolidation or CHF. Electronically Signed   By: Ashley Royalty M.D.   On: 09/25/2017 23:28    ASSESSMENT / PLAN: Acute on chronic respiratory failure secondary to asthma exacerbation and pneumonia vs. URI  Slowly improving Acute renal failure with hyperkalemia-resolving Hyponatremia    Hx: Diabetes Mellitus, Tuberculosis, and HTN  P: Scheduled and prn bronchodilator therapy IV and nebulized steroids Continue empiric abx for now  Trend BMP  Replace electrolytes as indicated  Monitor UOP VTE px: subq lovenox Trend CBC  Monitor for s/sx of bleeding  Transfuse for hgb <7 SSI    Can transfer to gen med floor today   Patient/Family are satisfied with Plan of action and management. All questions answered  Corrin Parker, M.D.  [facility]  Medical Director [place] Director [facility] Cardio-Pulmonary Department

## 2017-09-27 NOTE — Progress Notes (Signed)
CRITICAL VALUE ALERT  Critical Value:  CBG  Date & Time Notied:  09/27/17 1655  Provider Notified: Dr.Shreyang Posey Pronto  Orders Received/Actions taken: 20 units of novolog

## 2017-09-28 LAB — GLUCOSE, CAPILLARY
GLUCOSE-CAPILLARY: 414 mg/dL — AB (ref 65–99)
GLUCOSE-CAPILLARY: 351 mg/dL — AB (ref 65–99)
Glucose-Capillary: 234 mg/dL — ABNORMAL HIGH (ref 65–99)
Glucose-Capillary: 278 mg/dL — ABNORMAL HIGH (ref 65–99)
Glucose-Capillary: 319 mg/dL — ABNORMAL HIGH (ref 65–99)

## 2017-09-28 MED ORDER — LEVALBUTEROL HCL 1.25 MG/0.5ML IN NEBU: 1.2500 mg | INHALATION_SOLUTION | Freq: Three times a day (TID) | RESPIRATORY_TRACT | 12 refills | Status: DC

## 2017-09-28 MED ORDER — LEVALBUTEROL HCL 1.25 MG/0.5ML IN NEBU: 1.2500 mg | INHALATION_SOLUTION | Freq: Four times a day (QID) | RESPIRATORY_TRACT | 0 refills | Status: DC

## 2017-09-28 MED ORDER — AZITHROMYCIN 250 MG PO TABS: ORAL_TABLET | ORAL | 0 refills | Status: DC

## 2017-09-28 MED ORDER — INSULIN ASPART 100 UNIT/ML ~~LOC~~ SOLN
5.0000 [IU] | Freq: Once | SUBCUTANEOUS | Status: AC
  Administered 2017-09-28: 5 [IU] via SUBCUTANEOUS
  Filled 2017-09-28: qty 1

## 2017-09-28 MED ORDER — GUAIFENESIN-CODEINE 100-10 MG/5ML PO SOLN: 5.0000 mL | ORAL | 0 refills | Status: AC | PRN

## 2017-09-28 MED ORDER — GUAIFENESIN-CODEINE 100-10 MG/5ML PO SOLN: 5.0000 mL | ORAL | 0 refills | Status: DC | PRN

## 2017-09-28 MED ORDER — DOCUSATE SODIUM 100 MG PO CAPS
100.0000 mg | ORAL_CAPSULE | Freq: Two times a day (BID) | ORAL | Status: DC | PRN
  Administered 2017-09-28 – 2017-09-29 (×2): 100 mg via ORAL
  Filled 2017-09-28 (×2): qty 1

## 2017-09-28 NOTE — Care Management (Addendum)
Discharge to home today per Dr. Posey Pronto. Medicaid pending. Ms Mcqueary has been to the [facility] about 2 weeks ago per daughter. Discussed calling the [facility] and arranging another follow-up appointment Will update Lurena Nida. Discussed going to Medication Management.. The department is closed today. Dr. Posey Pronto will give 2 sets of prescriptions. Daughter states she can  buy 2 days of medication.  Shelbie Ammons RN MSN CCM Care Management [phone]

## 2017-09-28 NOTE — Progress Notes (Signed)
[place] at [place] NAME: Whitney Fleming    MR#:  [id number]  DATE OF BIRTH:  1945-08-06  SUBJECTIVE:  Patient's breathing is improved however her sugars were elevated into the 400s I was planning to discharge the patient patient family concern  REVIEW OF SYSTEMS:   Review of Systems  Constitutional: Negative for chills, fever and weight loss.  HENT: Negative for ear discharge, ear pain and nosebleeds.   Eyes: Negative for blurred vision, pain and discharge.  Respiratory: Positive for cough, sputum production and shortness of breath. Negative for wheezing and stridor.   Cardiovascular: Negative for chest pain, palpitations, orthopnea and PND.  Gastrointestinal: Negative for abdominal pain, diarrhea, nausea and vomiting.  Genitourinary: Negative for frequency and urgency.  Musculoskeletal: Negative for back pain and joint pain.  Neurological: Positive for weakness. Negative for sensory change, speech change and focal weakness.  Psychiatric/Behavioral: Negative for depression and hallucinations. The patient is not nervous/anxious.    Tolerating Diet:yes  DRUG ALLERGIES:  No Known Allergies  VITALS:  Blood pressure (!) 135/59, pulse (!) 102, temperature 98.4 F (36.9 C), temperature source Oral, resp. rate 18, height 5\' 1"  (1.549 m), weight 80.6 kg (177 lb 11.1 oz), SpO2 97 %.  PHYSICAL EXAMINATION:   Physical Exam  Pulmonary/Chest: She has wheezes.    GENERAL:  72 y.o.-year-old patient lying in the bed with no acute distress. Obese EYES: Pupils equal, round, reactive to light and accommodation. No scleral icterus. Extraocular muscles intact.  HEENT: Head atraumatic, normocephalic. Oropharynx and nasopharynx clear.  NECK:  Supple, no jugular venous distention. No thyroid enlargement, no tenderness.  LUNGS: distant breath sounds bilaterally, no wheezing, rales, rhonchi. No use of accessory muscles of respiration.  CARDIOVASCULAR:  S1, S2 normal. No murmurs, rubs, or gallops.  ABDOMEN: Soft, nontender, nondistended. Bowel sounds present. No organomegaly or mass.  EXTREMITIES: No cyanosis, clubbing or edema b/l.    NEUROLOGIC: Cranial nerves II through XII are intact. No focal Motor or sensory deficits b/l.   PSYCHIATRIC:  patient is alert and oriented x 3.  SKIN: No obvious rash, lesion, or ulcer.   LABORATORY PANEL:  CBC Recent Labs  Lab 09/26/17 0559  WBC 6.4  HGB 11.0*  HCT 33.0*  PLT 192    Chemistries  Recent Labs  Lab 09/26/17 0559  09/27/17 0433  NA 128*  --  132*  K 6.0*   < > 4.6  CL 102  --  98*  CO2 21*  --  25  GLUCOSE 288*  --  369*  BUN 21*  --  26*  CREATININE 0.93  --  1.01*  CALCIUM 7.8*  --  7.9*  MG  --   --  1.5*  AST 27  --   --   ALT 21  --   --   ALKPHOS 49  --   --   BILITOT 0.4  --   --    < > = values in this interval not displayed.   Cardiac Enzymes Recent Labs  Lab 09/25/17 2259  TROPONINI <0.03   RADIOLOGY:  No results found. ASSESSMENT AND PLAN:  Whitney Fleming  is a 72 y.o. female who presents with progressive cough, shortness of breath and wheezing over the past 3 days.  1. Acute respiratory failure with hypoxia (HCC) -due to asthma, patient's breathing has improved Wean down steroids  2.  Asthma exacerbation -continue IV steroids, continue duo nebs.   -Wean steroid -prn  cough meds   3.  HTN (hypertension) -continue home meds  4.  Diabetes ([place]) -sliding scale insulin with corresponding glucose checks -According to her daughter patient had Teah with sugar I have recommended not to eat or drink sugar containing food or drinks from home -I have doubled patient's metformin have also started patient on glipizide  5.  Miscellaneous Lovenox for DVT proph  Case discussed with Care Management/Social Worker. Management plans discussed with the patient, family and they are in agreement.  CODE STATUS: full DVT Prophylaxis: lovenox  TOTAL TIME TAKING CARE OF  THIS PATIENT: 30 minutes.  >50% time spent on counselling and coordination of care with pt and dter  POSSIBLE D/C IN 1-2 DAYS, DEPENDING ON CLINICAL CONDITION.  Note: This dictation was prepared with Dragon dictation along with smaller phrase technology. Any transcriptional errors that result from this process are unintentional.  Dustin Flock M.D on 09/28/2017 at 1:48 PM  Between 7am to 6pm - Pager - [phone]  After 6pm go to [email or web address] - password [email or web address]  [facility]  Office  [phone]  CC: Primary care physician; Patient, No Pcp PerPatient ID: Whitney Fleming, female   DOB: 08/31/1945, 72 y.o.   MRN: [id number]

## 2017-09-28 NOTE — Plan of Care (Signed)
  Problem: Education: Goal: Knowledge of General Education information will improve Outcome: Progressing   Problem: Health Behavior/Discharge Planning: Goal: Ability to manage health-related needs will improve Outcome: Progressing   Problem: Clinical Measurements: Goal: Will remain free from infection Outcome: Progressing   

## 2017-09-28 NOTE — Progress Notes (Signed)
CRITICAL VALUE ALERT  Critical Value:  CBG 414  Date & Time Notied:  04/20/191153  Provider Notified: Dr. Posey Pronto  Orders Received/Actions taken: 20 units of novolog

## 2017-09-28 NOTE — Progress Notes (Signed)
Patient up for discharge however CBG 351. Daughter concern to take pt home with blood sugars being elevated. Dr. Posey Pronto aware and orders to cancel d/c order. Will continue to monitor.

## 2017-09-29 LAB — GLUCOSE, CAPILLARY: GLUCOSE-CAPILLARY: 230 mg/dL — AB (ref 65–99)

## 2017-09-29 MED ORDER — HYDRALAZINE HCL 20 MG/ML IJ SOLN
5.0000 mg | Freq: Once | INTRAMUSCULAR | Status: AC
  Administered 2017-09-29: 5 mg via INTRAVENOUS
  Filled 2017-09-29: qty 1

## 2017-09-29 NOTE — Progress Notes (Signed)
Whitney Fleming to be D/C'd Home per MD order.  Discussed prescriptions and follow up appointments with the patient and daughter Prescriptions given to patient, medication list explained in detail. Pt and daughter verbalized understanding.  Allergies as of 09/29/2017   No Known Allergies     Medication List    TAKE these medications   azithromycin 250 MG tablet Commonly known as:  ZITHROMAX One day daily x 3 days   guaiFENesin-codeine 100-10 MG/5ML syrup Take 5 mLs by mouth every 4 (four) hours as needed for up to 2 days for cough.   levalbuterol 1.25 MG/0.5ML nebulizer solution Commonly known as:  XOPENEX Take 1.25 mg by nebulization every 6 (six) hours for 2 days.   PRESCRIPTION MEDICATION **Non-US market drug** Tenebite (teneligliptin)  1 tablet by mouth every morning   PRESCRIPTION MEDICATION **Non-US market drug** Amaryl MV2 (metformin/glipizide/voglibose) 1 tablet by mouth twice daily before meals   PRESCRIPTION MEDICATION **Non-US market drug** Carca CR20 (carvedilol controlled release) Take 1 tablet by mouth every evening   PRESCRIPTION MEDICATION **Non-US market drug** Eritel CH40 (telmesartan/chlorthaladone) Take 1 tablet by mouth every morning after breakfast   PRESCRIPTION MEDICATION **Non-US market drug** Zyrova C (rosuvastatin/clopidigrel) Take 1 capsule by mouth every evening   PRESCRIPTION MEDICATION **Non-US market drug** Mashyne (cholecalciferol) Take 1 tablet by mouth every day after lunch   PRESCRIPTION MEDICATION **Non-US market drug** Rabiprime DSR (rabeprazole/domperidone) Take 1 capsule by mouth every morning as directed   PRESCRIPTION MEDICATION **Non-US market drug** Volken (multivitamin with green tea extract) Take 1 tablet every morning   PRESCRIPTION MEDICATION **Non-US market drug** Betavert 24 (betahistine)  Take 1 tablet by mouth every morning as directed   PRESCRIPTION MEDICATION **Non-US market drug** Bro Zedex SF  (guaifenesin/terbutaline/bromhexine) liquid Take by mouth as directed   PRESCRIPTION MEDICATION **Non-US market drug** Alerfix M (levocetirizine/montelukast) Take 1 tablet by mouth every morning as directed   PRESCRIPTION MEDICATION **Non-US market drug** Alprax 0.5 (alprazolam) Take by mouth as directed   PRESCRIPTION MEDICATION **Non-US market drug** Triohale MDI (tiotropium/formoterol/ciclesonide)  Inhale as directed            Museum/gallery conservator  (From admission, onward)        Start     Ordered   09/26/17 1358  For home use only DME Nebulizer machine  Once    Question:  Patient needs a nebulizer to treat with the following condition  Answer:  COPD (chronic obstructive pulmonary disease) ([place])   09/26/17 1358      Vitals:   09/29/17 1011 09/29/17 1130  BP: (!) 171/66 (!) 154/60  Pulse: (!) 104 (!) 119  Resp: 18 18  Temp:    SpO2: 98% 100%    Tele box removed and returned. Skin clean, dry and intact without evidence of skin break down, no evidence of skin tears noted. IV catheter discontinued intact. Site without signs and symptoms of complications. Dressing and pressure applied. Pt denies pain at this time. No complaints noted.  An After Visit Summary was printed and given to the patient. Patient escorted via [place], and D/C home via private auto.  Whitney Fleming

## 2017-09-29 NOTE — Discharge Summary (Signed)
[place] at [facility], 72 y.o., DOB [birth year], MRN [id number]. Admission date: 09/25/2017 Discharge Date 09/29/2017 Primary MD Patient, No Pcp Per Admitting Physician Lance Coon, MD  Admission Diagnosis  Acute respiratory distress [R06.03] Hyponatremia [E87.1] Severe persistent asthma with exacerbation [J45.51] Acute respiratory failure with hypoxia ([place]) [J96.01]  Discharge Diagnosis   Principal Problem:   Acute respiratory failure with hypoxia (HCC) Acute asthma exacerbation Acute bronchitis Diabetes type 2 with elevated blood sugars due to steroid Essential hypertension Hyperlipidemia     Hospital Course Patient 72 year old [place] female who presented with progressive cough shortness of breath and wheezing.  Patient initially required BiPAP and subsequently taken off BiPAP.  She was treated for acute asthma exasperation with nebulizer therapy steroids and antibiotics.  She is now much better.  Her blood sugars were elevated due to steroids they are improving as well.            Consults  pulmonary/intensive care  Significant Tests:  See full reports for all details     Dg Chest Portable 1 View  Result Date: 09/25/2017 CLINICAL DATA:  What sounded cough and wheeze for the past several days. EXAM: PORTABLE CHEST 1 VIEW COMPARISON:  12/25/2012 FINDINGS: Mild pulmonary hyperinflation. Prominent pulmonary vasculature compatible with chronic pulmonary hypertension. Heart size is normal with aortic atherosclerosis. There is atelectasis at the lung bases. No effusion or pneumothorax. IMPRESSION: Prominent pulmonary vasculature consistent with chronic pulmonary hypertension, especially in light of hyperinflated lungs. No pulmonary consolidation or CHF. Electronically Signed   By: Ashley Royalty M.D.   On: 09/25/2017 23:28       Today   Subjective:   Whitney Fleming feeling much better shortness of breath improved Objective:   Blood  pressure (!) 154/60, pulse (!) 119, temperature 98.6 F (37 C), temperature source Oral, resp. rate 18, height 5\' 1"  (1.549 m), weight 80.6 kg (177 lb 11.1 oz), SpO2 100 %.  .  Intake/Output Summary (Last 24 hours) at 09/29/2017 1309 Last data filed at 09/29/2017 1003 Gross per 24 hour  Intake 360 ml  Output 0 ml  Net 360 ml    Exam VITAL SIGNS: Blood pressure (!) 154/60, pulse (!) 119, temperature 98.6 F (37 C), temperature source Oral, resp. rate 18, height 5\' 1"  (1.549 m), weight 80.6 kg (177 lb 11.1 oz), SpO2 100 %.  GENERAL:  72 y.o.-year-old patient lying in the bed with no acute distress.  EYES: Pupils equal, round, reactive to light and accommodation. No scleral icterus. Extraocular muscles intact.  HEENT: Head atraumatic, normocephalic. Oropharynx and nasopharynx clear.  NECK:  Supple, no jugular venous distention. No thyroid enlargement, no tenderness.  LUNGS: Normal breath sounds bilaterally, no wheezing, rales,rhonchi or crepitation. No use of accessory muscles of respiration.  CARDIOVASCULAR: S1, S2 normal. No murmurs, rubs, or gallops.  ABDOMEN: Soft, nontender, nondistended. Bowel sounds present. No organomegaly or mass.  EXTREMITIES: No pedal edema, cyanosis, or clubbing.  NEUROLOGIC: Cranial nerves II through XII are intact. Muscle strength 5/5 in all extremities. Sensation intact. Gait not checked.  PSYCHIATRIC: The patient is alert and oriented x 3.  SKIN: No obvious rash, lesion, or ulcer.   Data Review     CBC w Diff:  Lab Results  Component Value Date   WBC 6.4 09/26/2017   HGB 11.0 (L) 09/26/2017   HGB 9.5 (L) 12/25/2012   HCT 33.0 (L) 09/26/2017   HCT 29.6 (L) 12/25/2012   PLT 192 09/26/2017   PLT  191 12/25/2012   LYMPHOPCT 20 09/25/2017   LYMPHOPCT 11.5 12/25/2012   MONOPCT 13 09/25/2017   MONOPCT 2.3 12/25/2012   EOSPCT 2 09/25/2017   EOSPCT 0.0 12/25/2012   BASOPCT 1 09/25/2017   BASOPCT 0.3 12/25/2012   CMP:  Lab Results  Component Value  Date   NA 132 (L) 09/27/2017   NA 133 (L) 12/26/2012   K 4.6 09/27/2017   K 4.7 12/26/2012   CL 98 (L) 09/27/2017   CL 105 12/26/2012   CO2 25 09/27/2017   CO2 16 (L) 12/26/2012   BUN 26 (H) 09/27/2017   BUN 24 (H) 12/26/2012   CREATININE 1.01 (H) 09/27/2017   CREATININE 1.16 12/26/2012   PROT 6.9 09/26/2017   ALBUMIN 3.3 (L) 09/26/2017   BILITOT 0.4 09/26/2017   ALKPHOS 49 09/26/2017   AST 27 09/26/2017   ALT 21 09/26/2017  .  Micro Results Recent Results (from the past 240 hour(s))  Blood Culture (routine x 2)     Status: None (Preliminary result)   Collection Time: 09/25/17 11:06 PM  Result Value Ref Range Status   Specimen Description BLOOD BLOOD RIGHT WRIST  Final   Special Requests   Final    BOTTLES DRAWN AEROBIC AND ANAEROBIC Blood Culture results may not be optimal due to an excessive volume of blood received in culture bottles   Culture   Final    NO GROWTH 4 DAYS Performed at [facility], [address] [place], [place] [address]    Report Status PENDING  Incomplete  Blood Culture (routine x 2)     Status: None (Preliminary result)   Collection Time: 09/25/17 11:08 PM  Result Value Ref Range Status   Specimen Description LEFT ANTECUBITAL  Final   Special Requests   Final    BOTTLES DRAWN AEROBIC AND ANAEROBIC Blood Culture results may not be optimal due to an excessive volume of blood received in culture bottles   Culture   Final    NO GROWTH 4 DAYS Performed at [facility], [address] [place], [place] [address]    Report Status PENDING  Incomplete  MRSA PCR Screening     Status: None   Collection Time: 09/26/17  2:00 AM  Result Value Ref Range Status   MRSA by PCR NEGATIVE NEGATIVE Final    Comment:        The GeneXpert MRSA Assay (FDA approved for NASAL specimens only), is one component of a comprehensive MRSA colonization surveillance program. It is not intended to diagnose MRSA infection nor to guide or monitor  treatment for MRSA infections. Performed at [facility], [address] [place], [place] [address]         Code Status Orders  (From admission, onward)        Start     Ordered   09/26/17 0132  Full code  Continuous     09/26/17 0131    Code Status History    This patient has a current code status but no historical code status.            Discharge Medications   Allergies as of 09/29/2017   No Known Allergies     Medication List    TAKE these medications   azithromycin 250 MG tablet Commonly known as:  ZITHROMAX One day daily x 3 days   guaiFENesin-codeine 100-10 MG/5ML syrup Take 5 mLs by mouth every 4 (four) hours as needed for up to 2 days for  cough.   levalbuterol 1.25 MG/0.5ML nebulizer solution Commonly known as:  XOPENEX Take 1.25 mg by nebulization every 6 (six) hours for 2 days.   PRESCRIPTION MEDICATION **Non-US market drug** Tenebite (teneligliptin)  1 tablet by mouth every morning   PRESCRIPTION MEDICATION **Non-US market drug** Amaryl MV2 (metformin/glipizide/voglibose) 1 tablet by mouth twice daily before meals   PRESCRIPTION MEDICATION **Non-US market drug** Carca CR20 (carvedilol controlled release) Take 1 tablet by mouth every evening   PRESCRIPTION MEDICATION **Non-US market drug** Eritel CH40 (telmesartan/chlorthaladone) Take 1 tablet by mouth every morning after breakfast   PRESCRIPTION MEDICATION **Non-US market drug** Zyrova C (rosuvastatin/clopidigrel) Take 1 capsule by mouth every evening   PRESCRIPTION MEDICATION **Non-US market drug** Mashyne (cholecalciferol) Take 1 tablet by mouth every day after lunch   PRESCRIPTION MEDICATION **Non-US market drug** Rabiprime DSR (rabeprazole/domperidone) Take 1 capsule by mouth every morning as directed   PRESCRIPTION MEDICATION **Non-US market drug** Volken (multivitamin with green tea extract) Take 1 tablet every morning   PRESCRIPTION  MEDICATION **Non-US market drug** Betavert 24 (betahistine)  Take 1 tablet by mouth every morning as directed   PRESCRIPTION MEDICATION **Non-US market drug** Bro Zedex SF (guaifenesin/terbutaline/bromhexine) liquid Take by mouth as directed   PRESCRIPTION MEDICATION **Non-US market drug** Alerfix M (levocetirizine/montelukast) Take 1 tablet by mouth every morning as directed   PRESCRIPTION MEDICATION **Non-US market drug** Alprax 0.5 (alprazolam) Take by mouth as directed   PRESCRIPTION MEDICATION **Non-US market drug** Triohale MDI (tiotropium/formoterol/ciclesonide)  Inhale as directed            Museum/gallery conservator  (From admission, onward)        Start     Ordered   09/26/17 1358  For home use only DME Nebulizer machine  Once    Question:  Patient needs a nebulizer to treat with the following condition  Answer:  COPD (chronic obstructive pulmonary disease) ([place])   09/26/17 1358         Total Time in preparing paper work, data evaluation and todays exam - 52 minutes  Dustin Flock M.D on 09/29/2017 at 1:09 PM [employer]   Office  [phone]

## 2017-09-29 NOTE — Progress Notes (Signed)
Patient up for d/c however pt's b/p elevated this morning 171/66, HR 104. Dr. Posey Pronto notified and received orders for IV hydralazine 5 mg. Will administer and continue to monitor.

## 2017-09-30 LAB — CULTURE, BLOOD (ROUTINE X 2)
CULTURE: NO GROWTH
Culture: NO GROWTH

## 2017-10-10 ENCOUNTER — Ambulatory Visit: Payer: Medicaid Other | Admitting: Adult Health Nurse Practitioner

## 2017-10-10 VITALS — BP 134/62 | HR 90 | Temp 97.9°F | Wt 174.4 lb

## 2017-10-10 DIAGNOSIS — J4521 Mild intermittent asthma with (acute) exacerbation: Secondary | ICD-10-CM

## 2017-10-10 DIAGNOSIS — I10 Essential (primary) hypertension: Secondary | ICD-10-CM

## 2017-10-10 DIAGNOSIS — E0821 Diabetes mellitus due to underlying condition with diabetic nephropathy: Secondary | ICD-10-CM

## 2017-10-10 MED ORDER — ALBUTEROL SULFATE (2.5 MG/3ML) 0.083% IN NEBU: 2.5000 mg | INHALATION_SOLUTION | Freq: Four times a day (QID) | RESPIRATORY_TRACT | 12 refills | Status: DC | PRN

## 2017-10-10 NOTE — Progress Notes (Signed)
Patient: Whitney Fleming Female    DOB: 1946/03/30   72 y.o.   MRN: [id number] Visit Date: 10/10/2017  Today's Provider: Staci Acosta, NP   Chief Complaint  Patient presents with  . Establish Care    patient was in hospital for asthma   Subjective:    HPI      {Pt was hospitalized mid-April for asthma exacerbation. Symptoms have resolved at this time.  Using Xopenex nebulizer once daily.  Finished Azithromycin course.    DM:  A1c was 8.1.  Pt takes medications that she has received from [place].  No insulin to manage- taking fasting CBGs- averaging 150-250  HTN/HLD:  Taking medications as directed.     No Known Allergies Previous Medications   AZITHROMYCIN (ZITHROMAX) 250 MG TABLET    One day daily x 3 days   GUAIFENESIN-CODEINE (ROBITUSSIN AC) 100-10 MG/5ML SYRUP    Take 5 mLs by mouth 3 (three) times daily as needed for cough.   LEVALBUTEROL (XOPENEX) 1.25 MG/0.5ML NEBULIZER SOLUTION    Take 1.25 mg by nebulization every 6 (six) hours for 2 days.   PRESCRIPTION MEDICATION    **Non-US market drug** Tenebite (teneligliptin)  1 tablet by mouth every morning   PRESCRIPTION MEDICATION    **Non-US market drug** Amaryl MV2 (metformin/glipizide/voglibose) 1 tablet by mouth twice daily before meals   PRESCRIPTION MEDICATION    **Non-US market drug** Carca CR20 (carvedilol controlled release) Take 1 tablet by mouth every evening   PRESCRIPTION MEDICATION    **Non-US market drug** Eritel CH40 (telmesartan/chlorthaladone) Take 1 tablet by mouth every morning after breakfast   PRESCRIPTION MEDICATION    **Non-US market drug** Zyrova C (rosuvastatin/clopidigrel) Take 1 capsule by mouth every evening   PRESCRIPTION MEDICATION    **Non-US market drug** Mashyne (cholecalciferol) Take 1 tablet by mouth every day after lunch   PRESCRIPTION MEDICATION    **Non-US market drug** Rabiprime DSR (rabeprazole/domperidone) Take 1 capsule by mouth every morning as directed   PRESCRIPTION MEDICATION    **Non-US market drug** Volken (multivitamin with green tea extract) Take 1 tablet every morning   PRESCRIPTION MEDICATION    **Non-US market drug** Betavert 24 (betahistine)  Take 1 tablet by mouth every morning as directed   PRESCRIPTION MEDICATION    **Non-US market drug** Bro Zedex SF (guaifenesin/terbutaline/bromhexine) liquid Take by mouth as directed   PRESCRIPTION MEDICATION    **Non-US market drug** Alerfix M (levocetirizine/montelukast) Take 1 tablet by mouth every morning as directed   PRESCRIPTION MEDICATION    **Non-US market drug** Alprax 0.5 (alprazolam) Take by mouth as directed   PRESCRIPTION MEDICATION    **Non-US market drug** Triohale MDI (tiotropium/formoterol/ciclesonide)  Inhale as directed    Review of Systems  Social History   Tobacco Use  . Smoking status: Never Smoker  . Smokeless tobacco: Never Used  Substance Use Topics  . Alcohol use: Never    Frequency: Never   Objective:   BP 134/62   Pulse 90   Temp 97.9 F (36.6 C)   Wt 174 lb 6.4 oz (79.1 kg)   BMI 32.95 kg/m   Physical Exam  Constitutional: She appears well-developed and well-nourished.  Cardiovascular: Normal rate, regular rhythm and normal heart sounds.  Pulmonary/Chest: Effort normal and breath sounds normal.  Mild upper airway wheezing noted.   Abdominal: Soft. Bowel sounds are normal.  Skin: Skin is warm and dry.        Assessment & Plan:        Asthma:  Xopenex  changed to Albuterol due to cost.  Monitor for exacerbation.  If patient has another exacerbation will add daily maintenance therapy.  FU precautions given.   DM:  Encourage diabetic diet and exercise.  Continue current medication regimen.   HTN:  Controlled. Goal BP <140/90.  Continue current medication regimen.  Encourage low salt diet and exercise.   HLD:   Continue current regimen.  Encourage low cholesterol, low fat diet and exercise.   Reviewed recent labs and  patient would like to continue to take medications from her [place]- will convert medications when she completes her current prescriptions.           Staci Acosta, NP   [facility]

## 2017-10-24 ENCOUNTER — Ambulatory Visit: Payer: Medicaid Other | Admitting: Adult Health Nurse Practitioner

## 2017-10-24 VITALS — BP 131/65 | HR 81 | Temp 97.5°F | Wt 174.0 lb

## 2017-10-24 DIAGNOSIS — J4521 Mild intermittent asthma with (acute) exacerbation: Secondary | ICD-10-CM

## 2017-10-24 MED ORDER — ALBUTEROL SULFATE HFA 108 (90 BASE) MCG/ACT IN AERS: 1.0000 | INHALATION_SPRAY | Freq: Four times a day (QID) | RESPIRATORY_TRACT | 11 refills | Status: DC | PRN

## 2017-10-24 NOTE — Progress Notes (Signed)
Patient: Whitney Fleming Female    DOB: Feb 28, 1946   72 y.o.   MRN: [id number] Visit Date: 10/24/2017  Today's Provider: Staci Acosta, NP   Chief Complaint  Patient presents with  . Follow-up    difficulty breathing, lower pain back  . COPD   Subjective:    HPI   Pt is continuing to wheeze- especially with exertion - just walking around the house.  Some SOB as well with exertion.   Using nebulizer with no relief.  Taking Salbutamol 176mcg inhaler with relief.      No Known Allergies Previous Medications   ALBUTEROL (PROVENTIL) (2.5 MG/3ML) 0.083% NEBULIZER SOLUTION    Take 3 mLs (2.5 mg total) by nebulization every 6 (six) hours as needed for wheezing or shortness of breath.   AZITHROMYCIN (ZITHROMAX) 250 MG TABLET    One day daily x 3 days   GUAIFENESIN-CODEINE (ROBITUSSIN AC) 100-10 MG/5ML SYRUP    Take 5 mLs by mouth 3 (three) times daily as needed for cough.   PRESCRIPTION MEDICATION    **Non-US market drug** Tenebite (teneligliptin)  1 tablet by mouth every morning   PRESCRIPTION MEDICATION    **Non-US market drug** Amaryl MV2 (metformin/glipizide/voglibose) 1 tablet by mouth twice daily before meals   PRESCRIPTION MEDICATION    **Non-US market drug** Carca CR20 (carvedilol controlled release) Take 1 tablet by mouth every evening   PRESCRIPTION MEDICATION    **Non-US market drug** Eritel CH40 (telmesartan/chlorthaladone) Take 1 tablet by mouth every morning after breakfast   PRESCRIPTION MEDICATION    **Non-US market drug** Zyrova C (rosuvastatin/clopidigrel) Take 1 capsule by mouth every evening   PRESCRIPTION MEDICATION    **Non-US market drug** Mashyne (cholecalciferol) Take 1 tablet by mouth every day after lunch   PRESCRIPTION MEDICATION    **Non-US market drug** Rabiprime DSR (rabeprazole/domperidone) Take 1 capsule by mouth every morning as directed   PRESCRIPTION MEDICATION    **Non-US market drug** Volken (multivitamin with green tea extract) Take 1  tablet every morning   PRESCRIPTION MEDICATION    **Non-US market drug** Betavert 24 (betahistine)  Take 1 tablet by mouth every morning as directed   PRESCRIPTION MEDICATION    **Non-US market drug** Bro Zedex SF (guaifenesin/terbutaline/bromhexine) liquid Take by mouth as directed   PRESCRIPTION MEDICATION    **Non-US market drug** Alerfix M (levocetirizine/montelukast) Take 1 tablet by mouth every morning as directed   PRESCRIPTION MEDICATION    **Non-US market drug** Alprax 0.5 (alprazolam) Take by mouth as directed   PRESCRIPTION MEDICATION    **Non-US market drug** Triohale MDI (tiotropium/formoterol/ciclesonide)  Inhale as directed    Review of Systems  Respiratory: Positive for wheezing.     Social History   Tobacco Use  . Smoking status: Never Smoker  . Smokeless tobacco: Never Used  Substance Use Topics  . Alcohol use: Never    Frequency: Never   Objective:   BP 131/65   Pulse 81   Temp (!) 97.5 F (36.4 C) (Oral)   Wt 174 lb (78.9 kg)   BMI 32.88 kg/m   Physical Exam  Constitutional: She appears well-developed and well-nourished.  Cardiovascular: Normal rate, regular rhythm and normal heart sounds.  Pulmonary/Chest: Effort normal. She has wheezes.        Assessment & Plan:        Albuterol inaler Rx.  Discussed PNA vaccine.  If symptoms persist will consider daily maintenance medications.  FU in 4 weeks. - May add Spiriva at that time.   Discussed FU precautions.  Staci Acosta, NP   [facility]

## 2017-11-05 ENCOUNTER — Emergency Department
Admission: EM | Admit: 2017-11-05 | Discharge: 2017-11-05 | Disposition: A | Discharge status: Home / Self Care | Payer: Medicaid Other | Attending: Emergency Medicine | Admitting: Emergency Medicine

## 2017-11-05 ENCOUNTER — Emergency Department: Payer: Medicaid Other

## 2017-11-05 ENCOUNTER — Other Ambulatory Visit: Payer: Self-pay

## 2017-11-05 DIAGNOSIS — E875 Hyperkalemia: Secondary | ICD-10-CM

## 2017-11-05 DIAGNOSIS — I10 Essential (primary) hypertension: Secondary | ICD-10-CM | POA: Diagnosis not present

## 2017-11-05 DIAGNOSIS — R0602 Shortness of breath: Secondary | ICD-10-CM | POA: Diagnosis not present

## 2017-11-05 DIAGNOSIS — J40 Bronchitis, not specified as acute or chronic: Secondary | ICD-10-CM | POA: Diagnosis not present

## 2017-11-05 LAB — CBC
HCT: 37.3 % (ref 35.0–47.0)
HEMOGLOBIN: 12.3 g/dL (ref 12.0–16.0)
MCH: 27.4 pg (ref 26.0–34.0)
MCHC: 33 g/dL (ref 32.0–36.0)
MCV: 83.2 fL (ref 80.0–100.0)
Platelets: 237 10*3/uL (ref 150–440)
RBC: 4.49 MIL/uL (ref 3.80–5.20)
RDW: 15.2 % — ABNORMAL HIGH (ref 11.5–14.5)
WBC: 11.5 10*3/uL — AB (ref 3.6–11.0)

## 2017-11-05 LAB — BASIC METABOLIC PANEL
ANION GAP: 7 (ref 5–15)
BUN: 17 mg/dL (ref 6–20)
CALCIUM: 8.8 mg/dL — AB (ref 8.9–10.3)
CHLORIDE: 106 mmol/L (ref 101–111)
CO2: 22 mmol/L (ref 22–32)
Creatinine, Ser: 0.84 mg/dL (ref 0.44–1.00)
GFR calc non Af Amer: >60 mL/min (ref >60)
Glucose, Bld: 165 mg/dL — ABNORMAL HIGH (ref 65–99)
Potassium: 6.2 mmol/L — ABNORMAL HIGH (ref 3.5–5.1)
Sodium: 135 mmol/L (ref 135–145)

## 2017-11-05 LAB — POTASSIUM: Potassium: 5.6 mmol/L — ABNORMAL HIGH (ref 3.5–5.1)

## 2017-11-05 MED ORDER — AZITHROMYCIN 500 MG PO TABS
500.0000 mg | ORAL_TABLET | Freq: Once | ORAL | Status: AC
  Administered 2017-11-05: 500 mg via ORAL
  Filled 2017-11-05: qty 1

## 2017-11-05 MED ORDER — AZITHROMYCIN 250 MG PO TABS: 250.0000 mg | ORAL_TABLET | Freq: Every day | ORAL | 0 refills | Status: AC

## 2017-11-05 MED ORDER — ALBUTEROL SULFATE (2.5 MG/3ML) 0.083% IN NEBU
5.0000 mg | INHALATION_SOLUTION | Freq: Once | RESPIRATORY_TRACT | Status: AC
  Administered 2017-11-05: 5 mg via RESPIRATORY_TRACT

## 2017-11-05 MED ORDER — ALBUTEROL SULFATE (2.5 MG/3ML) 0.083% IN NEBU
INHALATION_SOLUTION | RESPIRATORY_TRACT | Status: AC
  Administered 2017-11-05: 5 mg via RESPIRATORY_TRACT
  Filled 2017-11-05: qty 3

## 2017-11-05 MED ORDER — PREDNISONE 20 MG PO TABS: 40.0000 mg | ORAL_TABLET | Freq: Every day | ORAL | 0 refills | Status: DC

## 2017-11-05 MED ORDER — IPRATROPIUM-ALBUTEROL 0.5-2.5 (3) MG/3ML IN SOLN
3.0000 mL | Freq: Once | RESPIRATORY_TRACT | Status: AC
  Administered 2017-11-05: 3 mL via RESPIRATORY_TRACT
  Filled 2017-11-05: qty 3

## 2017-11-05 MED ORDER — PREDNISONE 20 MG PO TABS
40.0000 mg | ORAL_TABLET | Freq: Once | ORAL | Status: AC
  Administered 2017-11-05: 40 mg via ORAL
  Filled 2017-11-05: qty 2

## 2017-11-05 MED ORDER — SODIUM CHLORIDE 0.9 % IV SOLN
1.0000 g | Freq: Once | INTRAVENOUS | Status: AC
  Administered 2017-11-05: 1 g via INTRAVENOUS
  Filled 2017-11-05: qty 10

## 2017-11-05 MED ORDER — SODIUM CHLORIDE 0.9 % IV BOLUS
1000.0000 mL | Freq: Once | INTRAVENOUS | Status: AC
  Administered 2017-11-05: 1000 mL via INTRAVENOUS

## 2017-11-05 NOTE — ED Triage Notes (Signed)
Pt arrived via POV with daughter with reports of shortness of breath worse since last night. Pt has hx of COPD and has been using nebs with minimal relief of wheezing.  Coughing occasionally that is minimal productive.  Pt frequently clearing voice.   Pt does not speak [place].

## 2017-11-05 NOTE — ED Provider Notes (Signed)
[facility] Emergency Department Provider Note  ____________________________________________  Time seen: Approximately 5:28 PM  I have reviewed the triage vital signs and the nursing notes.   HISTORY  Chief Complaint Shortness of Breath  The patient does not speak any English but is here with her daughter, who has provided translation as well as HPI.  HPI Ila Slezak is a 72 y.o. female with a history of asthma, TB, HTN, DM, presenting for shortness of breath.  The patient describes 2 days of an occasionally productive cough with wheezing and shortness of breath.  The symptoms are worse with exertion.  She has not been having any fever or chills, nausea vomiting or diarrhea.  She has not had any lower extremity swelling or calf pain.  She has been taking nebulizer treatments every 6 hours and an MDI in between without any significant improvement.  The patient was discharged from the hospital 09/29/2017 after an asthma exacerbation with acute respiratory failure; she completed antibiotic and prednisone treatments.  The patient does not have a primary care physician or pulmonologist yet.   Past Medical History:  Diagnosis Date  . Asthma   . Cardiomegaly   . Diabetes mellitus without complication ([place])   . Hypertension   . TB (tuberculosis)     Patient Active Problem List   Diagnosis Date Noted  . Asthma exacerbation 09/26/2017  . Acute respiratory failure with hypoxia ([place]) 09/26/2017  . HTN (hypertension) 09/26/2017  . Diabetes ([place]) 09/26/2017    Past Surgical History:  Procedure Laterality Date  . NO PAST SURGERIES      Current Outpatient Rx  . Order #: [id number] Class: Normal  . Order #: [id number] Class: Normal  . Order #: [id number] Class: Print  . Order #: [id number] Class: Historical Med  . Order #: [id number] Class: Historical Med  . Order #: [id number] Class: Historical Med  . Order #: [id number] Class: Historical Med  . Order #: [id number] Class:  Historical Med  . Order #: [id number] Class: Historical Med  . Order #: [id number] Class: Historical Med  . Order #: [id number] Class: Historical Med  . Order #: [id number] Class: Historical Med  . Order #: [id number] Class: Historical Med  . Order #: [id number] Class: Historical Med  . Order #: [id number] Class: Historical Med  . Order #: [id number] Class: Historical Med  . Order #: [id number] Class: Historical Med    Allergies Patient has no known allergies.  Family History  Problem Relation Age of Onset  . Tuberculosis Other     Social History Social History   Tobacco Use  . Smoking status: Never Smoker  . Smokeless tobacco: Never Used  Substance Use Topics  . Alcohol use: Never    Frequency: Never  . Drug use: Never    Review of Systems Constitutional: No fever/chills.  No lightheadedness or syncope.  No diaphoresis. Eyes: No visual changes.  No eye discharge. ENT: No sore throat. No congestion or rhinorrhea. Cardiovascular: Denies chest pain. Denies palpitations. Respiratory: Positive wheezing and shortness of breath.  Positive occasional reactive cough. Gastrointestinal: No abdominal pain.  No nausea, no vomiting.  No diarrhea.  No constipation. Genitourinary: Negative for dysuria. Musculoskeletal: Negative for back pain.  No lower extremity swelling or calf pain Skin: Negative for rash. Neurological: Negative for headaches. No focal numbness, tingling or weakness.     ____________________________________________   PHYSICAL EXAM:  VITAL SIGNS: ED Triage Vitals [11/05/17 1406]  Enc Vitals Group     BP (!) 157/45     Pulse Rate 91     Resp 20  Temp 98 F (36.7 C)     Temp Source Oral     SpO2 99 %     Weight 180 lb (81.6 kg)     Height 5\' 2"  (1.575 m)     Head Circumference      Peak Flow      Pain Score 0     Pain Loc      Pain Edu?      Excl. in [place]?     Constitutional: Patient is alert and seems to answer questions appropriately although there is a  language barrier as described above.  She is nontoxic in appearance. Eyes: Conjunctivae are normal.  EOMI. No scleral icterus. Head: Atraumatic. Nose: No congestion/rhinnorhea. Mouth/Throat: Mucous membranes are moist.  Neck: No stridor.  Supple.  No JVD.  No meningismus. Cardiovascular: Normal rate, regular rhythm. No murmurs, rubs or gallops.  Respiratory: The patient has mild tachypnea without significant accessory muscle use or retractions.  She does have expiratory greater than inspiratory wheezing without rales or rhonchi bilaterally.  She is able to speak in 5-6 word sentences without difficulty. Gastrointestinal: Obese.  Soft, nontender and nondistended.  No guarding or rebound.  No peritoneal signs. Musculoskeletal: No LE edema. No ttp in the calves or palpable cords.  Negative Homan's sign. Neurologic: alert.    Face and smile are symmetric.  EOMI.  Moves all extremities well. Skin:  Skin is warm, dry and intact. No rash noted. ____________________________________________   LABS (all labs ordered are listed, but only abnormal results are displayed)  Labs Reviewed  BASIC METABOLIC PANEL - Abnormal; Notable for the following components:      Result Value   Potassium 6.2 (*)    Glucose, Bld 165 (*)    Calcium 8.8 (*)    All other components within normal limits  CBC - Abnormal; Notable for the following components:   WBC 11.5 (*)    RDW 15.2 (*)    All other components within normal limits   ____________________________________________  EKG  ED ECG REPORT I, Eula Listen, the attending physician, personally viewed and interpreted this ECG.   Date: 11/05/2017  EKG Time: 1420  Rate: 85  Rhythm: normal sinus rhythm  Axis: leftward  Intervals:none  ST&T Change: No STEMI  ____________________________________________  RADIOLOGY  Dg Chest 2 View  Result Date: 11/05/2017 CLINICAL DATA:  Worsening shortness of breath since last night. History of COPD with  current symptoms not responsive to medication. Minimally productive cough. EXAM: CHEST - 2 VIEW COMPARISON:  Portable chest x-ray of September 25, 2017 FINDINGS: The lungs are adequately inflated. The interstitial markings are coarse. There is no alveolar infiltrate. There is no pleural effusion. The heart is top-normal in size. The pulmonary vascularity is prominent centrally on the right but stable. There is calcification in the wall of the aortic arch. The bony thorax is unremarkable. IMPRESSION: Chronic bronchitic changes with possible superimposed acute bronchitis. No alveolar pneumonia nor CHF. Thoracic aortic atherosclerosis. Electronically Signed   By: David  Martinique M.D.   On: 11/05/2017 17:01    ____________________________________________   PROCEDURES  Procedure(s) performed: None  Procedures  Critical Care performed: No ____________________________________________   INITIAL IMPRESSION / ASSESSMENT AND PLAN / ED COURSE  Pertinent labs & imaging results that were available during my care of the patient were reviewed by me and considered in my medical decision making (see chart for details).  72 y.o. female with a history of asthma presenting with shortness of breath and wheezing,  intermittently productive cough with any fevers and chills.  Overall, the patient is hemodynamically stable.  Here, she is mildly tachypneic with some wheezing, but I do not hear any rales on her examination and her chest x-ray does not show any acute infiltrate.  Her chest x-ray and clinical findings are most consistent with asthma exacerbation or acute bronchitis.  Other etiologies including heart failure are less likely.  She did have a BNP which was normal during her last admission and has no known history of CHF.  ACS or MI is very unlikely and the patient's EKG does not have any ischemic changes or arrhythmias.  The patient has no evidence of DVT, no tachycardia, no hypoxia so PE is very unlikely.  We will have  the nurse perform an ambulatory oxygen saturation evaluation, and will initiate steroid medications, and azithromycin as well as a DuoNeb for presumed bronchitis with asthma exacerbation treatment.  I have had a long discussion with the patient's daughter about her findings today, and her daughter did express some concern about hypoglycemia in the setting of steroids during her last hospitalization.  We will use slightly lower dosing at 40 mg, and I have encouraged her to establish a primary care physician to discuss diabetes management in the setting of acute asthma treatment with steroids.  Discussed the risks and the benefits of actively using steroids to decrease inflammation in the lungs, and we will proceed with this plan.  ----------------------------------------- 9:08 PM on 11/05/2017 -----------------------------------------  Patient is work-up and evaluation in the emergency department has been reassuring.  There is no evidence of infiltrate on her chest x-ray, and she did not become hypoxic with exercise; she was able to maintain oxygen saturations in the mid to high 90s.  Her VBG is also reassuring.  Initially, she did have a potassium of 6.2 and after intravenous fluids it is now 5.6; is possible that this is due to dehydration.  There are no EKG changes associated with her hyperkalemia and I have asked her to have her potassium rechecked tomorrow and her primary care office.  At this time, the patient is safe for discharge home.  She will go home with steroid medications, antibiotics, and instructions to follow-up closely with a primary care physician.  She will also follow-up with the pulmonary specialist as well.  Return precautions were discussed. ____________________________________________  FINAL CLINICAL IMPRESSION(S) / ED DIAGNOSES  Final diagnoses:  None         NEW MEDICATIONS STARTED DURING THIS VISIT:  New Prescriptions   No medications on file      Eula Listen, MD 11/05/17 2109

## 2017-11-05 NOTE — Discharge Instructions (Addendum)
Please call the [facility] clinic to schedule an appointment to have your potassium rechecked tomorrow.  In addition, you will need to establish a primary care physician to manage your medical problems.  Please also make a follow-up appointment with the pulmonologist.  These finish the entire course of antibiotics and steroid medications, even if you are feeling better.  You may talk to your primary care physician about elevated blood sugars if those occur due to the steroid medication.  Drink plenty of fluids stay well-hydrated.  Return to the emergency department if you develop severe pain, shortness of breath, fever, lightheadedness or fainting, or any other symptoms concerning to you.

## 2017-11-07 ENCOUNTER — Institutional Professional Consult (permissible substitution): Payer: Self-pay | Admitting: Internal Medicine

## 2017-11-07 NOTE — Progress Notes (Deleted)
The patient is a 72 yo female 72 y.o. female with a history of asthma, TB, HTN, DM. She was recently admitted to the hospital for 4 daysin April 2019 for AE asthma. She presented to the ED again 2 days ago with AE asthma. She was treated with steroids, abx, and asked to follow up with pulmonary.   Imaging personally reviewed, CXR 11/05/17; changes of chronic bronchitis, right heart enlargement.

## 2017-11-08 LAB — BLOOD GAS, VENOUS
ACID-BASE DEFICIT: 4.5 mmol/L — AB (ref 0.0–2.0)
BICARBONATE: 22.1 mmol/L (ref 20.0–28.0)
PATIENT TEMPERATURE: 37
PH VEN: 7.29 (ref 7.250–7.430)
pCO2, Ven: 46 mmHg (ref 44.0–60.0)

## 2017-11-12 NOTE — Progress Notes (Signed)
[facility] Consultation      Assessment and Plan:  Asthma/chronic bronchitis, with multiple exacerbations of the last few months. - Severe persistent asthma with persistent wheezing. - Elevated eosinophil count of 300. - Continue triple inhaler, current prescription is from [place], will prescribe Trelegy inhaler, hopefully will be covered.  Start Singulair, asked to use nebulizers 3 times daily. -If symptoms continue, will consider checking allergy profile, or IgE, currently the patient is just off prednisone therefore would be little utility in checking it at this time.  Could consider pulmonary function testing or spirometry, however given patient's lack of understanding of English doubtful that she could successfully complete a meaningful test.  Dyspnea on exertion. - Secondary to above. - Patient has multiple medical problems and is on a long list of medications, I highly recommended establishing care with a primary care physician.  Meds ordered this encounter  Medications  . albuterol (PROVENTIL) (2.5 MG/3ML) 0.083% nebulizer solution    Sig: Take 3 mLs (2.5 mg total) by nebulization 3 (three) times daily.    Dispense:  75 mL    Refill:  12  . montelukast (SINGULAIR) 10 MG tablet    Sig: Take 1 tablet (10 mg total) by mouth at bedtime.    Dispense:  30 tablet    Refill:  2  . Fluticasone-Umeclidin-Vilant (TRELEGY ELLIPTA) 100-62.5-25 MCG/INH AEPB    Sig: Inhale 1 puff into the lungs daily.    Dispense:  1 each    Refill:  11   Return in about 3 months (around 02/13/2018).   Date: 11/13/2017  MRN# [id number] Whitney Fleming May 29, 1946  Referring Physician: Self  Whitney Fleming is a 72 y.o. old female seen in consultation for chief complaint of:    Chief Complaint  Patient presents with  . Consult    Self referral: pt takes most medications from [place]  . Bronchitis    cleared after Prednisone/Abx  . Cough    cleared  . Shortness of Breath    stable  . Chest  Pain    occasional    HPI:   The patient is a 72 yo. female with a history of asthma, TB, HTN, DM. She was recently admitted to the hospital for 4 days in April 2019 for AE asthma. She presented to the ED again 2 days ago with AE asthma. She was treated with steroids, abx, and asked to follow up with pulmonary. [place] triple LABA/LAMA/steroid combo inhaler.  She is from the state of [place], however she is now living with her family here in [place], and plans to stay here. Patient is here with her daughter who gives some of the history.  She has a triple inhaler which she takes 2 puffs once per day, and she has an albuterol inhaler but it causes her nausea. She uses a albuterol neb once per day.  Currently the breathign is better than it was since her ED visit, she is currently on abx and steroids and feels that she is doing better with this.   She has been to the ER to the hospital twice over past 2 months. She has no known allergies, she has occasional runny nose, occasional reflux. No pets, she has never been tested for sleep apnea. She is not sleepy during the day, she does snore at night.  She has never been a smoker.   Imaging personally reviewed, CXR 11/05/17; changes of chronic bronchitis,  right heart enlargement.  **CBC 09/25/17; Abs eos count 300.   PMHX:   Past Medical History:  Diagnosis Date  . Asthma   . Cardiomegaly   . Diabetes mellitus without complication ([place])   . Hypertension   . TB (tuberculosis)    Surgical Hx:  Past Surgical History:  Procedure Laterality Date  . NO PAST SURGERIES     Family Hx:  Family History  Problem Relation Age of Onset  . Tuberculosis Other    Social Hx:   Social History   Tobacco Use  . Smoking status: Never Smoker  . Smokeless tobacco: Never Used  Substance Use Topics  . Alcohol use: Never    Frequency: Never  . Drug use: Never   Medication:    Current  Outpatient Medications:  .  albuterol (PROVENTIL HFA;VENTOLIN HFA) 108 (90 Base) MCG/ACT inhaler, Inhale 1-2 puffs into the lungs every 6 (six) hours as needed for wheezing or shortness of breath., Disp: 1 Inhaler, Rfl: 11 .  albuterol (PROVENTIL) (2.5 MG/3ML) 0.083% nebulizer solution, Take 3 mLs (2.5 mg total) by nebulization every 6 (six) hours as needed for wheezing or shortness of breath., Disp: 75 mL, Rfl: 12 .  guaiFENesin-codeine (ROBITUSSIN AC) 100-10 MG/5ML syrup, Take 5 mLs by mouth 3 (three) times daily as needed for cough., Disp: , Rfl:  .  PRESCRIPTION MEDICATION, **Non-US market drug** Tenebite (teneligliptin)  1 tablet by mouth every morning, Disp: , Rfl:  .  PRESCRIPTION MEDICATION, **Non-US market drug** Amaryl MV2 (metformin/glipizide/voglibose) 1 tablet by mouth twice daily before meals, Disp: , Rfl:  .  PRESCRIPTION MEDICATION, **Non-US market drug** Carca CR20 (carvedilol controlled release) Take 1 tablet by mouth every evening, Disp: , Rfl:  .  PRESCRIPTION MEDICATION, **Non-US market drug** Eritel CH40 (telmesartan/chlorthaladone) Take 1 tablet by mouth every morning after breakfast, Disp: , Rfl:  .  PRESCRIPTION MEDICATION, **Non-US market drug** Zyrova C (rosuvastatin/clopidigrel) Take 1 capsule by mouth every evening, Disp: , Rfl:  .  PRESCRIPTION MEDICATION, **Non-US market drug** Mashyne (cholecalciferol) Take 1 tablet by mouth every day after lunch, Disp: , Rfl:  .  PRESCRIPTION MEDICATION, **Non-US market drug** Rabiprime DSR (rabeprazole/domperidone) Take 1 capsule by mouth every morning as directed, Disp: , Rfl:  .  PRESCRIPTION MEDICATION, **Non-US market drug** Volken (multivitamin with green tea extract) Take 1 tablet every morning, Disp: , Rfl:  .  PRESCRIPTION MEDICATION, **Non-US market drug** Betavert 24 (betahistine)  Take 1 tablet by mouth every morning as directed, Disp: , Rfl:  .  PRESCRIPTION MEDICATION, **Non-US market drug** Bro Zedex SF  (guaifenesin/terbutaline/bromhexine) liquid Take by mouth as directed, Disp: , Rfl:  .  PRESCRIPTION MEDICATION, **Non-US market drug** Alerfix M (levocetirizine/montelukast) Take 1 tablet by mouth every morning as directed, Disp: , Rfl:  .  PRESCRIPTION MEDICATION, **Non-US market drug** Alprax 0.5 (alprazolam) Take by mouth as directed, Disp: , Rfl:  .  PRESCRIPTION MEDICATION, **Non-US market drug** Triohale MDI (tiotropium/formoterol/ciclesonide)  Inhale as directed, Disp: , Rfl:    Allergies:  Patient has no known allergies.  Review of Systems: Gen:  Denies  fever, sweats, chills HEENT: Denies blurred vision, double vision. bleeds, sore throat Cvc:  No dizziness, chest pain. Resp:   Denies cough or sputum production, shortness of breath Gi: Denies swallowing difficulty, stomach pain. Gu:  Denies bladder incontinence, burning urine Ext:   No Joint pain, stiffness. Skin: No skin rash,  hives  Endoc:  No polyuria, polydipsia. Psych: No depression, insomnia. Other:  All other systems were  reviewed with the patient and were negative other that what is mentioned in the HPI.   Physical Examination:   VS: BP 120/62 (BP Location: Right Arm, Cuff Size: Large)   Pulse 100   Resp 16   Ht 5\' 2"  (1.575 m)   Wt 173 lb (78.5 kg)   SpO2 99%   BMI 31.64 kg/m   General Appearance: No distress  Neuro:without focal findings,  speech normal,  HEENT: PERRLA, EOM intact.   Pulmonary: normal breath sounds, No wheezing.  CardiovascularNormal S1,S2.  No m/r/g.   Abdomen: Benign, Soft, non-tender. Renal:  No costovertebral tenderness  GU:  No performed at this time. Endoc: No evident thyromegaly, no signs of acromegaly. Skin:   warm, no rashes, no ecchymosis  Extremities: normal, no cyanosis, clubbing.  Other findings:    LABORATORY PANEL:   CBC No results for input(s): WBC, HGB, HCT, PLT in the last 168  hours. ------------------------------------------------------------------------------------------------------------------  Chemistries  No results for input(s): NA, K, CL, CO2, GLUCOSE, BUN, CREATININE, CALCIUM, MG, AST, ALT, ALKPHOS, BILITOT in the last 168 hours.  Invalid input(s): GFRCGP ------------------------------------------------------------------------------------------------------------------  Cardiac Enzymes No results for input(s): TROPONINI in the last 168 hours. ------------------------------------------------------------  RADIOLOGY:  No results found.     Thank  you for the consultation and for allowing [facility] to assist in the care of your patient. Our recommendations are noted above.  Please contact us if we can be of further service.   Marda Stalker, MD.  Board Certified in Internal Medicine, Pulmonary Medicine, [place], and Sleep Medicine.  [facility] Office Number: [phone]  Patricia Pesa, M.D.  Merton Border, M.D  11/13/2017

## 2017-11-13 ENCOUNTER — Encounter: Payer: Self-pay | Admitting: Internal Medicine

## 2017-11-13 ENCOUNTER — Ambulatory Visit (INDEPENDENT_AMBULATORY_CARE_PROVIDER_SITE_OTHER): Payer: Medicaid Other | Admitting: Internal Medicine

## 2017-11-13 VITALS — BP 120/62 | HR 100 | Resp 16 | Ht 62.0 in | Wt 173.0 lb

## 2017-11-13 DIAGNOSIS — J4550 Severe persistent asthma, uncomplicated: Secondary | ICD-10-CM | POA: Diagnosis not present

## 2017-11-13 DIAGNOSIS — R0609 Other forms of dyspnea: Secondary | ICD-10-CM

## 2017-11-13 DIAGNOSIS — J449 Chronic obstructive pulmonary disease, unspecified: Secondary | ICD-10-CM | POA: Diagnosis not present

## 2017-11-13 MED ORDER — FLUTICASONE-UMECLIDIN-VILANT 100-62.5-25 MCG/INH IN AEPB: 1.0000 | INHALATION_SPRAY | Freq: Every day | RESPIRATORY_TRACT | 11 refills | Status: DC

## 2017-11-13 MED ORDER — MONTELUKAST SODIUM 10 MG PO TABS: 10.0000 mg | ORAL_TABLET | Freq: Every day | ORAL | 2 refills | Status: DC

## 2017-11-13 MED ORDER — ALBUTEROL SULFATE (2.5 MG/3ML) 0.083% IN NEBU: 2.5000 mg | INHALATION_SOLUTION | Freq: Three times a day (TID) | RESPIRATORY_TRACT | 12 refills | Status: DC

## 2017-11-13 NOTE — Patient Instructions (Addendum)
Use albuterol nebulizer at least twice per day.  Will start singulair Her primary care doctor I would recommend [facility] [place] for [facility] in [place]

## 2017-11-18 ENCOUNTER — Telehealth: Payer: Self-pay | Admitting: Internal Medicine

## 2017-11-18 MED ORDER — FLUTICASONE-UMECLIDIN-VILANT 100-62.5-25 MCG/INH IN AEPB: 1.0000 | INHALATION_SPRAY | Freq: Every day | RESPIRATORY_TRACT | 11 refills | Status: DC

## 2017-11-18 MED ORDER — ALBUTEROL SULFATE (2.5 MG/3ML) 0.083% IN NEBU: 2.5000 mg | INHALATION_SOLUTION | Freq: Three times a day (TID) | RESPIRATORY_TRACT | 3 refills | Status: DC

## 2017-11-18 NOTE — Telephone Encounter (Signed)
Pt no longer uses med management. Rx resent to [facility].

## 2017-11-18 NOTE — Telephone Encounter (Signed)
Patient daughter calling States that at last appointment there was suppose to be a prescription sent in for a nebulizer, an inhaler, as well as a medication they were unsure of the name  [facility] states they have never received  Please send in another prescription to [facility] on [address]

## 2017-11-20 ENCOUNTER — Institutional Professional Consult (permissible substitution): Payer: Self-pay | Admitting: Internal Medicine

## 2017-11-21 ENCOUNTER — Telehealth: Payer: Self-pay | Admitting: *Deleted

## 2017-11-21 ENCOUNTER — Ambulatory Visit: Payer: Medicaid Other

## 2017-11-21 MED ORDER — FLUTICASONE FUROATE-VILANTEROL 200-25 MCG/INH IN AEPB: 1.0000 | INHALATION_SPRAY | Freq: Every day | RESPIRATORY_TRACT | 2 refills | Status: DC

## 2017-11-21 NOTE — Telephone Encounter (Signed)
Patient's insurance [place] [employer] will not cover Trelegy as first line of therapy.  Breo 200 will be sent to pharmacy. Patient's daughter notified.

## 2017-12-02 ENCOUNTER — Emergency Department: Payer: Medicaid Other

## 2017-12-02 ENCOUNTER — Inpatient Hospital Stay
Admission: EM | Admit: 2017-12-02 | Discharge: 2017-12-05 | DRG: 190 | Disposition: A | Discharge status: Home Health | Payer: Medicaid Other | Attending: Internal Medicine | Admitting: Internal Medicine

## 2017-12-02 DIAGNOSIS — J4521 Mild intermittent asthma with (acute) exacerbation: Secondary | ICD-10-CM

## 2017-12-02 DIAGNOSIS — J9601 Acute respiratory failure with hypoxia: Secondary | ICD-10-CM

## 2017-12-02 DIAGNOSIS — J441 Chronic obstructive pulmonary disease with (acute) exacerbation: Secondary | ICD-10-CM | POA: Diagnosis not present

## 2017-12-02 DIAGNOSIS — Z7951 Long term (current) use of inhaled steroids: Secondary | ICD-10-CM | POA: Diagnosis not present

## 2017-12-02 DIAGNOSIS — J9600 Acute respiratory failure, unspecified whether with hypoxia or hypercapnia: Secondary | ICD-10-CM | POA: Diagnosis present

## 2017-12-02 DIAGNOSIS — E872 Acidosis: Secondary | ICD-10-CM | POA: Diagnosis present

## 2017-12-02 DIAGNOSIS — J9602 Acute respiratory failure with hypercapnia: Secondary | ICD-10-CM | POA: Diagnosis present

## 2017-12-02 DIAGNOSIS — E871 Hypo-osmolality and hyponatremia: Secondary | ICD-10-CM | POA: Diagnosis not present

## 2017-12-02 DIAGNOSIS — E1165 Type 2 diabetes mellitus with hyperglycemia: Secondary | ICD-10-CM | POA: Diagnosis present

## 2017-12-02 DIAGNOSIS — I10 Essential (primary) hypertension: Secondary | ICD-10-CM | POA: Diagnosis present

## 2017-12-02 DIAGNOSIS — J45901 Unspecified asthma with (acute) exacerbation: Secondary | ICD-10-CM | POA: Diagnosis present

## 2017-12-02 DIAGNOSIS — E875 Hyperkalemia: Secondary | ICD-10-CM | POA: Diagnosis present

## 2017-12-02 DIAGNOSIS — R0603 Acute respiratory distress: Secondary | ICD-10-CM | POA: Diagnosis not present

## 2017-12-02 DIAGNOSIS — R110 Nausea: Secondary | ICD-10-CM

## 2017-12-02 DIAGNOSIS — J9690 Respiratory failure, unspecified, unspecified whether with hypoxia or hypercapnia: Secondary | ICD-10-CM | POA: Diagnosis present

## 2017-12-02 DIAGNOSIS — J9811 Atelectasis: Secondary | ICD-10-CM | POA: Diagnosis not present

## 2017-12-02 DIAGNOSIS — R0602 Shortness of breath: Secondary | ICD-10-CM | POA: Diagnosis not present

## 2017-12-02 HISTORY — DX: Hypo-osmolality and hyponatremia: E87.1

## 2017-12-02 LAB — CBC WITH DIFFERENTIAL/PLATELET
BASOS ABS: 0.2 10*3/uL — AB (ref 0–0.1)
Basophils Relative: 1 %
EOS ABS: 1.4 10*3/uL — AB (ref 0–0.7)
Eosinophils Relative: 8 %
HCT: 40 % (ref 35.0–47.0)
HEMOGLOBIN: 13.4 g/dL (ref 12.0–16.0)
LYMPHS ABS: 4.4 10*3/uL — AB (ref 1.0–3.6)
Lymphocytes Relative: 28 %
MCH: 27.8 pg (ref 26.0–34.0)
MCHC: 33.4 g/dL (ref 32.0–36.0)
MCV: 83.2 fL (ref 80.0–100.0)
Monocytes Absolute: 1.3 10*3/uL — ABNORMAL HIGH (ref 0.2–0.9)
Monocytes Relative: 8 %
NEUTROS PCT: 55 %
Neutro Abs: 8.9 10*3/uL — ABNORMAL HIGH (ref 1.4–6.5)
Platelets: 303 10*3/uL (ref 150–440)
RBC: 4.8 MIL/uL (ref 3.80–5.20)
RDW: 14.3 % (ref 11.5–14.5)
WBC: 16.2 10*3/uL — AB (ref 3.6–11.0)

## 2017-12-02 LAB — BLOOD GAS, VENOUS
ACID-BASE DEFICIT: 5.1 mmol/L — AB (ref 0.0–2.0)
Bicarbonate: 24.6 mmol/L (ref 20.0–28.0)
DELIVERY SYSTEMS: POSITIVE
FIO2: 0.3
O2 Saturation: 94.2 %
PATIENT TEMPERATURE: 37
pCO2, Ven: 66 mmHg — ABNORMAL HIGH (ref 44.0–60.0)
pH, Ven: 7.18 — CL (ref 7.250–7.430)
pO2, Ven: 89 mmHg — ABNORMAL HIGH (ref 32.0–45.0)

## 2017-12-02 LAB — COMPREHENSIVE METABOLIC PANEL
ALT: 19 U/L (ref 14–54)
AST: 24 U/L (ref 15–41)
Albumin: 4.3 g/dL (ref 3.5–5.0)
Alkaline Phosphatase: 70 U/L (ref 38–126)
Anion gap: 10 (ref 5–15)
BILIRUBIN TOTAL: 0.6 mg/dL (ref 0.3–1.2)
BUN: 15 mg/dL (ref 6–20)
CO2: 20 mmol/L — ABNORMAL LOW (ref 22–32)
CREATININE: 0.96 mg/dL (ref 0.44–1.00)
Calcium: 8.6 mg/dL — ABNORMAL LOW (ref 8.9–10.3)
Chloride: 102 mmol/L (ref 101–111)
GFR calc Af Amer: >60 mL/min (ref >60)
GFR, EST NON AFRICAN AMERICAN: 57 mL/min — AB (ref >60)
Glucose, Bld: 209 mg/dL — ABNORMAL HIGH (ref 65–99)
Potassium: 6 mmol/L — ABNORMAL HIGH (ref 3.5–5.1)
Sodium: 132 mmol/L — ABNORMAL LOW (ref 135–145)
TOTAL PROTEIN: 8.3 g/dL — AB (ref 6.5–8.1)

## 2017-12-02 LAB — TROPONIN I

## 2017-12-02 LAB — GLUCOSE, CAPILLARY: Glucose-Capillary: 300 mg/dL — ABNORMAL HIGH (ref 65–99)

## 2017-12-02 LAB — BRAIN NATRIURETIC PEPTIDE: B NATRIURETIC PEPTIDE 5: 86 pg/mL (ref 0.0–100.0)

## 2017-12-02 MED ORDER — BISACODYL 5 MG PO TBEC
5.0000 mg | DELAYED_RELEASE_TABLET | Freq: Every day | ORAL | Status: DC | PRN
  Administered 2017-12-05: 07:00:00 5 mg via ORAL
  Filled 2017-12-02: qty 1

## 2017-12-02 MED ORDER — MAGNESIUM SULFATE 2 GM/50ML IV SOLN
INTRAVENOUS | Status: AC
  Administered 2017-12-02: 2 g via INTRAVENOUS
  Filled 2017-12-02: qty 50

## 2017-12-02 MED ORDER — ALPRAZOLAM 0.25 MG PO TABS: 0.2500 mg | ORAL_TABLET | Freq: Every evening | ORAL | Status: DC | PRN

## 2017-12-02 MED ORDER — METHYLPREDNISOLONE SODIUM SUCC 125 MG IJ SOLR
INTRAMUSCULAR | Status: AC
  Administered 2017-12-02: 125 mg via INTRAVENOUS
  Filled 2017-12-02: qty 2

## 2017-12-02 MED ORDER — MONTELUKAST SODIUM 10 MG PO TABS
10.0000 mg | ORAL_TABLET | Freq: Every day | ORAL | Status: DC
  Administered 2017-12-03 – 2017-12-04 (×3): 10 mg via ORAL
  Filled 2017-12-02 (×3): qty 1

## 2017-12-02 MED ORDER — DOCUSATE SODIUM 100 MG PO CAPS
100.0000 mg | ORAL_CAPSULE | Freq: Two times a day (BID) | ORAL | Status: DC
  Administered 2017-12-04 – 2017-12-05 (×2): 100 mg via ORAL
  Filled 2017-12-02 (×3): qty 1

## 2017-12-02 MED ORDER — ACETAMINOPHEN 325 MG PO TABS
650.0000 mg | ORAL_TABLET | Freq: Four times a day (QID) | ORAL | Status: DC | PRN
  Administered 2017-12-03: 650 mg via ORAL
  Filled 2017-12-02: qty 2

## 2017-12-02 MED ORDER — DOXYCYCLINE HYCLATE 100 MG PO TABS: 100.0000 mg | ORAL_TABLET | Freq: Two times a day (BID) | ORAL | Status: DC

## 2017-12-02 MED ORDER — ONDANSETRON HCL 4 MG/2ML IJ SOLN
4.0000 mg | Freq: Once | INTRAMUSCULAR | Status: AC
  Administered 2017-12-02: 4 mg via INTRAVENOUS
  Filled 2017-12-02: qty 2

## 2017-12-02 MED ORDER — IPRATROPIUM-ALBUTEROL 0.5-2.5 (3) MG/3ML IN SOLN
3.0000 mL | Freq: Once | RESPIRATORY_TRACT | Status: AC
  Administered 2017-12-02: 3 mL via RESPIRATORY_TRACT

## 2017-12-02 MED ORDER — INSULIN ASPART 100 UNIT/ML ~~LOC~~ SOLN
0.0000 [IU] | Freq: Every day | SUBCUTANEOUS | Status: DC
  Administered 2017-12-03: 3 [IU] via SUBCUTANEOUS
  Filled 2017-12-02: qty 1

## 2017-12-02 MED ORDER — CARVEDILOL 3.125 MG PO TABS
6.2500 mg | ORAL_TABLET | Freq: Two times a day (BID) | ORAL | Status: DC
  Administered 2017-12-03: 6.25 mg via ORAL
  Filled 2017-12-02: qty 1

## 2017-12-02 MED ORDER — SODIUM CHLORIDE 0.9 % IV SOLN
1.0000 g | INTRAVENOUS | Status: DC
  Administered 2017-12-03: 1 g via INTRAVENOUS
  Filled 2017-12-02: qty 10

## 2017-12-02 MED ORDER — ONDANSETRON HCL 4 MG/2ML IJ SOLN
4.0000 mg | Freq: Four times a day (QID) | INTRAMUSCULAR | Status: DC | PRN
  Administered 2017-12-03: 4 mg via INTRAVENOUS
  Filled 2017-12-02: qty 2

## 2017-12-02 MED ORDER — TRAZODONE HCL 50 MG PO TABS: 25.0000 mg | ORAL_TABLET | Freq: Every evening | ORAL | Status: DC | PRN

## 2017-12-02 MED ORDER — MAGNESIUM SULFATE 2 GM/50ML IV SOLN
2.0000 g | Freq: Once | INTRAVENOUS | Status: AC
  Administered 2017-12-02: 2 g via INTRAVENOUS

## 2017-12-02 MED ORDER — SODIUM CHLORIDE 0.9 % IV SOLN
Freq: Once | INTRAVENOUS | Status: AC
  Administered 2017-12-03: via INTRAVENOUS

## 2017-12-02 MED ORDER — ROSUVASTATIN CALCIUM 20 MG PO TABS
20.0000 mg | ORAL_TABLET | Freq: Every day | ORAL | Status: DC
  Administered 2017-12-03 – 2017-12-04 (×2): 20 mg via ORAL
  Filled 2017-12-02: qty 2
  Filled 2017-12-02 (×2): qty 1

## 2017-12-02 MED ORDER — ONDANSETRON HCL 4 MG PO TABS: 4.0000 mg | ORAL_TABLET | Freq: Four times a day (QID) | ORAL | Status: DC | PRN

## 2017-12-02 MED ORDER — ALBUTEROL SULFATE (2.5 MG/3ML) 0.083% IN NEBU
2.5000 mg | INHALATION_SOLUTION | RESPIRATORY_TRACT | Status: DC
  Administered 2017-12-02: 2.5 mg via RESPIRATORY_TRACT

## 2017-12-02 MED ORDER — METHYLPREDNISOLONE SODIUM SUCC 125 MG IJ SOLR
125.0000 mg | Freq: Once | INTRAMUSCULAR | Status: AC
  Administered 2017-12-02: 125 mg via INTRAVENOUS

## 2017-12-02 MED ORDER — METHYLPREDNISOLONE SODIUM SUCC 125 MG IJ SOLR: 125.0000 mg | Freq: Once | INTRAMUSCULAR | Status: DC

## 2017-12-02 MED ORDER — DOXYCYCLINE HYCLATE 100 MG PO TABS
100.0000 mg | ORAL_TABLET | Freq: Once | ORAL | Status: AC
  Administered 2017-12-02: 100 mg via ORAL
  Filled 2017-12-02: qty 1

## 2017-12-02 MED ORDER — METHYLPREDNISOLONE SODIUM SUCC 125 MG IJ SOLR: 80.0000 mg | Freq: Two times a day (BID) | INTRAMUSCULAR | Status: DC

## 2017-12-02 MED ORDER — CLOPIDOGREL BISULFATE 75 MG PO TABS
75.0000 mg | ORAL_TABLET | Freq: Every day | ORAL | Status: DC
  Administered 2017-12-03 – 2017-12-05 (×3): 75 mg via ORAL
  Filled 2017-12-02 (×3): qty 1

## 2017-12-02 MED ORDER — INSULIN ASPART 100 UNIT/ML ~~LOC~~ SOLN
0.0000 [IU] | Freq: Three times a day (TID) | SUBCUTANEOUS | Status: DC
  Filled 2017-12-02: qty 1

## 2017-12-02 MED ORDER — MAGNESIUM SULFATE 2 GM/50ML IV SOLN: 2.0000 g | Freq: Once | INTRAVENOUS | Status: DC

## 2017-12-02 MED ORDER — HYDROCODONE-ACETAMINOPHEN 5-325 MG PO TABS: 1.0000 | ORAL_TABLET | ORAL | Status: DC | PRN

## 2017-12-02 MED ORDER — HEPARIN SODIUM (PORCINE) 5000 UNIT/ML IJ SOLN
5000.0000 [IU] | Freq: Three times a day (TID) | INTRAMUSCULAR | Status: DC
  Administered 2017-12-03 – 2017-12-05 (×7): 5000 [IU] via SUBCUTANEOUS
  Filled 2017-12-02 (×7): qty 1

## 2017-12-02 MED ORDER — ACETAMINOPHEN 650 MG RE SUPP: 650.0000 mg | Freq: Four times a day (QID) | RECTAL | Status: DC | PRN

## 2017-12-02 MED ORDER — IPRATROPIUM-ALBUTEROL 0.5-2.5 (3) MG/3ML IN SOLN
3.0000 mL | Freq: Four times a day (QID) | RESPIRATORY_TRACT | Status: DC
  Administered 2017-12-03 – 2017-12-04 (×5): 3 mL via RESPIRATORY_TRACT
  Filled 2017-12-02 (×7): qty 3

## 2017-12-02 MED ORDER — FLUTICASONE FUROATE-VILANTEROL 200-25 MCG/INH IN AEPB
1.0000 | INHALATION_SPRAY | Freq: Every day | RESPIRATORY_TRACT | Status: DC
  Administered 2017-12-03 – 2017-12-05 (×3): 1 via RESPIRATORY_TRACT
  Filled 2017-12-02: qty 28

## 2017-12-02 MED ORDER — IPRATROPIUM-ALBUTEROL 0.5-2.5 (3) MG/3ML IN SOLN
RESPIRATORY_TRACT | Status: AC
  Administered 2017-12-02: 3 mL via RESPIRATORY_TRACT
  Filled 2017-12-02: qty 9

## 2017-12-02 NOTE — ED Notes (Signed)
Pt brought in by family with diff breathing x 1 hour.  No chest pain.  Hx copd. Pt reports nausea.  Pt speaks no english.  Family with pt.  md at bedside.  Iv started and labs sent.  Pt placed on bipap stat after breathing treatments.  meds given.  Xray done.

## 2017-12-02 NOTE — ED Provider Notes (Signed)
[facility] Emergency Department Provider Note    First MD Initiated Contact with Patient 12/02/17 1953     (approximate)  I have reviewed the triage vital signs and the nursing notes.   HISTORY  Chief Complaint Respiratory Distress    HPI Whitney Fleming is a 72 y.o. female with a history of reported COPD not any home oxygen presents to the ER with worsening shortness of breath of the past 1 hour.  States that she has been using her Pulmicort and rescue inhaler more frequently over the past 2 days but became suddenly worse this evening.  Patient arrives in moderate respiratory distress hypoxic to the low 80s and some fairly tachypneic with diminished breath sounds bilaterally.  Patient medially placed on nonrebreather and administered albuterol and DuoNeb therapy and this was done while awaiting placement on BiPAP.  Patient denies any chest pain.  No recent fevers or cough.  Portal he was recently put on steroids two weeks ago.    Past Medical History:  Diagnosis Date  . Asthma   . Cardiomegaly   . Diabetes mellitus without complication ([place])   . Hypertension   . TB (tuberculosis)    Family History  Problem Relation Age of Onset  . Tuberculosis Other    Past Surgical History:  Procedure Laterality Date  . NO PAST SURGERIES     Patient Active Problem List   Diagnosis Date Noted  . Asthma exacerbation 09/26/2017  . Acute respiratory failure with hypoxia ([place]) 09/26/2017  . HTN (hypertension) 09/26/2017  . Diabetes ([place]) 09/26/2017      Prior to Admission medications   Medication Sig Start Date End Date Taking? Authorizing Provider  albuterol (PROVENTIL HFA;VENTOLIN HFA) 108 (90 Base) MCG/ACT inhaler Inhale 1-2 puffs into the lungs every 6 (six) hours as needed for wheezing or shortness of breath. 10/24/17   Doles-Johnson, Teah, NP  albuterol (PROVENTIL) (2.5 MG/3ML) 0.083% nebulizer solution Take 3 mLs (2.5 mg total) by nebulization every 6 (six)  hours as needed for wheezing or shortness of breath. 10/10/17   Doles-Johnson, Teah, NP  albuterol (PROVENTIL) (2.5 MG/3ML) 0.083% nebulizer solution Take 3 mLs (2.5 mg total) by nebulization 3 (three) times daily. J44.9 11/18/17   [name], MD  fluticasone furoate-vilanterol (BREO ELLIPTA) 200-25 MCG/INH AEPB Inhale 1 puff into the lungs daily. 11/21/17   [name], MD  Fluticasone-Umeclidin-Vilant (TRELEGY ELLIPTA) 100-62.5-25 MCG/INH AEPB Inhale 1 puff into the lungs daily. 11/18/17   [name], MD  guaiFENesin-codeine (ROBITUSSIN AC) 100-10 MG/5ML syrup Take 5 mLs by mouth 3 (three) times daily as needed for cough.    [provider]  montelukast (SINGULAIR) 10 MG tablet Take 1 tablet (10 mg total) by mouth at bedtime. 11/13/17 11/13/18  [name], MD  PRESCRIPTION MEDICATION **Non-US market drug** Tenebite (teneligliptin)  1 tablet by mouth every morning    [provider]  PRESCRIPTION MEDICATION **Non-US market drug** Amaryl MV2 (metformin/glipizide/voglibose) 1 tablet by mouth twice daily before meals    [provider]  PRESCRIPTION MEDICATION **Non-US market drug** Carca CR20 (carvedilol controlled release) Take 1 tablet by mouth every evening    [provider]  PRESCRIPTION MEDICATION **Non-US market drug** Eritel CH40 (telmesartan/chlorthaladone) Take 1 tablet by mouth every morning after breakfast    [provider]  PRESCRIPTION MEDICATION **Non-US market drug** Zyrova C (rosuvastatin/clopidigrel) Take 1 capsule by mouth every evening    [provider]  PRESCRIPTION MEDICATION **Non-US market drug** Mashyne (cholecalciferol) Take 1 tablet  by mouth every day after lunch    [provider]  PRESCRIPTION MEDICATION **Non-US market drug** Rabiprime DSR (rabeprazole/domperidone) Take 1 capsule by mouth every morning as directed    [provider]  PRESCRIPTION  MEDICATION **Non-US market drug** Volken (multivitamin with green tea extract) Take 1 tablet every morning    [provider]  PRESCRIPTION MEDICATION **Non-US market drug** Betavert 24 (betahistine)  Take 1 tablet by mouth every morning as directed    [provider]  PRESCRIPTION MEDICATION **Non-US market drug** Bro Zedex SF (guaifenesin/terbutaline/bromhexine) liquid Take by mouth as directed    [provider]  PRESCRIPTION MEDICATION **Non-US market drug** Alerfix M (levocetirizine/montelukast) Take 1 tablet by mouth every morning as directed    [provider]  PRESCRIPTION MEDICATION **Non-US market drug** Alprax 0.5 (alprazolam) Take by mouth as directed    [provider]  PRESCRIPTION MEDICATION **Non-US market drug** Triohale MDI (tiotropium/formoterol/ciclesonide)  Inhale as directed    [provider]    Allergies Patient has no known allergies.    Social History Social History   Tobacco Use  . Smoking status: Never Smoker  . Smokeless tobacco: Never Used  Substance Use Topics  . Alcohol use: Never    Frequency: Never  . Drug use: Never    Review of Systems Patient denies headaches, rhinorrhea, blurry vision, numbness, shortness of breath, chest pain, edema, cough, abdominal pain, nausea, vomiting, diarrhea, dysuria, fevers, rashes or hallucinations unless otherwise stated above in HPI. ____________________________________________   PHYSICAL EXAM:  VITAL SIGNS: Vitals:   12/02/17 2040 12/02/17 2100  BP: (!) 111/49 (!) 110/59  Pulse: (!) 114 (!) 110  Resp: (!) 26   Temp:    SpO2: 100% 100%   Constitutional: Alert and oriented in acute resp distress Eyes: Conjunctivae are normal.  Head: Atraumatic. Nose: No congestion/rhinnorhea. Mouth/Throat: Mucous membranes are moist.   Neck: No stridor. Painless ROM.  Cardiovascular: tachycardic rate, regular rhythm. Grossly normal heart sounds.  Good  peripheral circulation. Respiratory: profoundly tachypnic with + use of accessory muscles and expiratory wheeze Gastrointestinal: Soft and nontender. No distention. No abdominal bruits. No CVA tenderness. Genitourinary: deferred Musculoskeletal: No lower extremity tenderness nor edema.  No joint effusions. Neurologic:  Normal speech and language. No gross focal neurologic deficits are appreciated. No facial droop Skin:  Skin is cool, dry and intact. No rash noted. Psychiatric: anxious appearing  ____________________________________________   LABS (all labs ordered are listed, but only abnormal results are displayed)  Results for orders placed or performed during the hospital encounter of 12/02/17 (from the past 24 hour(s))  Troponin I     Status: None   Collection Time: 12/02/17  8:00 PM  Result Value Ref Range   Troponin I <0.03 <0.03 ng/mL  Blood gas, venous     Status: Abnormal   Collection Time: 12/02/17  8:00 PM  Result Value Ref Range   FIO2 0.30    Delivery systems BILEVEL POSITIVE AIRWAY PRESSURE    pH, Ven 7.18 (LL) 7.250 - 7.430   pCO2, Ven 66 (H) 44.0 - 60.0 mmHg   pO2, Ven 89.0 (H) 32.0 - 45.0 mmHg   Bicarbonate 24.6 20.0 - 28.0 mmol/L   Acid-base deficit 5.1 (H) 0.0 - 2.0 mmol/L   O2 Saturation 94.2 %   Patient temperature 37.0    Collection site VEIN    Sample type VENOUS   CBC with Differential/Platelet     Status: Abnormal   Collection Time: 12/02/17  8:00 PM  Result Value Ref Range   WBC 16.2 (H) 3.6 - 11.0 K/uL   RBC 4.80 3.80 - 5.20 MIL/uL   Hemoglobin 13.4 12.0 - 16.0 g/dL   HCT 40.0 35.0 - 47.0 %   MCV 83.2 80.0 - 100.0 fL   MCH 27.8 26.0 - 34.0 pg   MCHC 33.4 32.0 - 36.0 g/dL   RDW 14.3 11.5 - 14.5 %   Platelets 303 150 - 440 K/uL   Neutrophils Relative % 55 %   Neutro Abs 8.9 (H) 1.4 - 6.5 K/uL   Lymphocytes Relative 28 %   Lymphs Abs 4.4 (H) 1.0 - 3.6 K/uL   Monocytes Relative 8 %   Monocytes Absolute 1.3 (H) 0.2 - 0.9 K/uL   Eosinophils  Relative 8 %   Eosinophils Absolute 1.4 (H) 0 - 0.7 K/uL   Basophils Relative 1 %   Basophils Absolute 0.2 (H) 0 - 0.1 K/uL  Comprehensive metabolic panel     Status: Abnormal   Collection Time: 12/02/17  8:00 PM  Result Value Ref Range   Sodium 132 (L) 135 - 145 mmol/L   Potassium 6.0 (H) 3.5 - 5.1 mmol/L   Chloride 102 101 - 111 mmol/L   CO2 20 (L) 22 - 32 mmol/L   Glucose, Bld 209 (H) 65 - 99 mg/dL   BUN 15 6 - 20 mg/dL   Creatinine, Ser 0.96 0.44 - 1.00 mg/dL   Calcium 8.6 (L) 8.9 - 10.3 mg/dL   Total Protein 8.3 (H) 6.5 - 8.1 g/dL   Albumin 4.3 3.5 - 5.0 g/dL   AST 24 15 - 41 U/L   ALT 19 14 - 54 U/L   Alkaline Phosphatase 70 38 - 126 U/L   Total Bilirubin 0.6 0.3 - 1.2 mg/dL   GFR calc non Af Amer 57 (L) >60 mL/min   GFR calc Af Amer >60 >60 mL/min   Anion gap 10 5 - 15  Brain natriuretic peptide     Status: None   Collection Time: 12/02/17  8:00 PM  Result Value Ref Range   B Natriuretic Peptide 86.0 0.0 - 100.0 pg/mL   ____________________________________________  EKG My review and personal interpretation at Time: 20:00   Indication: sob  Rate: 125  Rhythm: sinus Axis: normal Other: poor r wave progression, no stemi, nonspecific st abn likely rate dependent ____________________________________________  RADIOLOGY  I personally reviewed all radiographic images ordered to evaluate for the above acute complaints and reviewed radiology reports and findings.  These findings were personally discussed with the patient.  Please see medical record for radiology report.  ____________________________________________   PROCEDURES  Procedure(s) performed:  .Critical Care Performed by: Merlyn Lot, MD Authorized by: Merlyn Lot, MD   Critical care provider statement:    Critical care time (minutes):  35   Critical care time was exclusive of:  Separately billable procedures and treating other patients   Critical care was necessary to treat or prevent imminent  or life-threatening deterioration of the following conditions:  Respiratory failure   Critical care was time spent personally by me on the following activities:  Development of treatment plan with patient or surrogate, discussions with consultants, evaluation of patient's response to treatment, examination of patient, obtaining history from patient or surrogate, ordering and performing treatments and interventions, ordering and review of laboratory studies, ordering and review of radiographic studies, pulse oximetry, re-evaluation of patient's condition and review of old charts      Critical Care performed: yes ____________________________________________  INITIAL IMPRESSION / ASSESSMENT AND PLAN / ED COURSE  Pertinent labs & imaging results that were available during my care of the patient were reviewed by me and considered in my medical decision making (see chart for details).   DDX: Asthma, copd, CHF, pna, ptx, malignancy, Pe, anemia   Whitney Fleming is a 72 y.o. who presents to the ED with acute respiratory distress with hypoxia as described above.  Patient with history of COPD and based on her presentation I suspect severe COPD exacerbation.  Patient placed on BiPAP for respiratory support with improvement in her symptomatology.  Given IV Solu-Medrol as well as IV magnesium and 0 nebulizer treatments.  Blood gas does show mild acidosis with mild hypercapnia.  Chest x-ray shows no pneumothorax.  The patient will be placed on continuous pulse oximetry and telemetry for monitoring.  Laboratory evaluation will be sent to evaluate for the above complaints.     Clinical Course as of Dec 03 2122  Mon Dec 02, 2017  2014 Patient reassessed and feels much improved.     [PR]  2113 Blood work does show leukocytosis but patient also recently on steroids.  No evidence of focal infiltrate and she is afebrile.  Symptoms significantly improving with nebulizers and steroids.  Patient feels much more  comfortable at this point but still on BiPAP.  She is still tachypneic to 25 on BiPAP do feel she will need longer.  For continued respiratory support and will need admission to stepdown unit.   [PR]    Clinical Course User Index [PR] Merlyn Lot, MD     As part of my medical decision making, I reviewed the following data within the [place] notes reviewed and incorporated, Labs reviewed, notes from prior ED visits.   ____________________________________________   FINAL CLINICAL IMPRESSION(S) / ED DIAGNOSES  Final diagnoses:  Acute respiratory failure with hypoxia (H[facility])  COPD exacerbation ([facility])      NEW MEDICATIONS STARTED DURING THIS VISIT:  New Prescriptions   No medications on file     Note:  This document was prepared using Dragon voice recognition software and may include unintentional dictation errors.    Merlyn Lot, MD 12/02/17 2124

## 2017-12-02 NOTE — ED Notes (Signed)
md in with pt again . Pt alert.  Sinus tach  On monitor.

## 2017-12-02 NOTE — ED Notes (Signed)
Report called to iris rn ccu nurse

## 2017-12-02 NOTE — ED Notes (Signed)
Pt reports breathing easier since placed on bipap.  Pt alert.  Iv in place   Family with pt   Sinus tach on monitor.

## 2017-12-02 NOTE — H&P (Signed)
[facility] [place] NAME: Whitney Fleming    MR#:  [id number]  DATE OF BIRTH:  15-Feb-1946  DATE OF ADMISSION:  12/02/2017  PRIMARY CARE PHYSICIAN: [name], MD   REQUESTING/REFERRING PHYSICIAN:   CHIEF COMPLAINT:   Chief Complaint  Patient presents with  . Respiratory Distress    HISTORY OF PRESENT ILLNESS: Whitney Fleming  is a 72 y.o. female with a known history of asthma, COPD, diabetes type 2, hypertension. Patient is currently in respiratory distress, on BiPAP, unable to provide history.  Most of the information was taken from reviewing the medical chart and from discussion with emergency room physician and the family. Patient was brought to emergency room for acute onset of shortness of breath and productive cough with yellow sputum in the past 2 days, gradually getting worse, despite using nebulizer treatment at home.  No fever/chills, no chest pain. At the arrival to emergency room patient was noted to be in moderate respiratory distress, tachypneic and hypoxic with oxygen saturation in low 80s.  She was started on DuoNeb's and placed on BiPAP with improvement in her symptoms. Blood test done emergency room showed mild respiratory acidosis and hypercapnia with pH at 7.18 and PCO2 66.  Potassium level is elevated at 6.  Troponin level is lower than 0.03.  WBC is elevated at 16.2.  Patient has been taking prednisone and the past 2 weeks. Chest x-ray, reviewed by myself, shows bibasilar atelectasis.  EKG shows sinus tachycardia with heart rate at 125, no acute, ischemic ST-T changes. Patient is admitted for further evaluation and treatment.   PAST MEDICAL HISTORY:   Past Medical History:  Diagnosis Date  . Asthma   . Cardiomegaly   . Diabetes mellitus without complication ([place])   . Hypertension   . TB (tuberculosis)     PAST SURGICAL HISTORY:  Past Surgical History:  Procedure Laterality Date  . NO PAST SURGERIES       SOCIAL HISTORY:  Social History   Tobacco Use  . Smoking status: Never Smoker  . Smokeless tobacco: Never Used  Substance Use Topics  . Alcohol use: Never    Frequency: Never    FAMILY HISTORY:  Family History  Problem Relation Age of Onset  . Tuberculosis Other     DRUG ALLERGIES: No Known Allergies  REVIEW OF SYSTEMS:   Unable to obtain, due to patient being in respiratory distress and on BiPAP.  MEDICATIONS AT HOME:  Prior to Admission medications   Medication Sig Start Date End Date Taking? Authorizing Provider  albuterol (PROVENTIL HFA;VENTOLIN HFA) 108 (90 Base) MCG/ACT inhaler Inhale 1-2 puffs into the lungs every 6 (six) hours as needed for wheezing or shortness of breath. 10/24/17  Yes Doles-Johnson, Teah, NP  albuterol (PROVENTIL) (2.5 MG/3ML) 0.083% nebulizer solution Take 3 mLs (2.5 mg total) by nebulization every 6 (six) hours as needed for wheezing or shortness of breath. 10/10/17  Yes Doles-Johnson, Teah, NP  fluticasone furoate-vilanterol (BREO ELLIPTA) 200-25 MCG/INH AEPB Inhale 1 puff into the lungs daily. 11/21/17  Yes [name], MD  montelukast (SINGULAIR) 10 MG tablet Take 1 tablet (10 mg total) by mouth at bedtime. 11/13/17 11/13/18 Yes [name], MD  PRESCRIPTION MEDICATION **Non-US market drug** Tenebite (teneligliptin)  1 tablet by mouth every morning   Yes [provider]  PRESCRIPTION MEDICATION **Non-US market drug** Amaryl MV2 (metformin/glipizide/voglibose) 1 tablet by mouth twice daily before meals   Yes [provider]  PRESCRIPTION  MEDICATION **Non-US market drug** Carca CR20 (carvedilol controlled release) Take 1 tablet by mouth every evening   Yes [provider]  PRESCRIPTION MEDICATION **Non-US market drug** Eritel CH40 (telmesartan/chlorthaladone) Take 1 tablet by mouth every morning after breakfast   Yes [provider]  PRESCRIPTION MEDICATION **Non-US market drug** Zyrova C  (rosuvastatin/clopidigrel) Take 1 capsule by mouth every evening   Yes [provider]  PRESCRIPTION MEDICATION **Non-US market drug** Mashyne (cholecalciferol) Take 1 tablet by mouth every day after lunch   Yes [provider]  PRESCRIPTION MEDICATION **Non-US market drug** Rabiprime DSR (rabeprazole/domperidone) Take 1 capsule by mouth every morning as directed   Yes [provider]  PRESCRIPTION MEDICATION **Non-US market drug** Volken (multivitamin with green tea extract) Take 1 tablet every morning   Yes [provider]  PRESCRIPTION MEDICATION **Non-US market drug** Betavert 24 (betahistine)  Take 1 tablet by mouth every morning as directed   Yes [provider]  PRESCRIPTION MEDICATION **Non-US market drug** Bro Zedex SF (guaifenesin/terbutaline/bromhexine) liquid Take by mouth as directed   Yes [provider]  PRESCRIPTION MEDICATION **Non-US market drug** Alerfix M (levocetirizine/montelukast) Take 1 tablet by mouth every morning as directed   Yes [provider]  PRESCRIPTION MEDICATION **Non-US market drug** Alprax 0.5 (alprazolam) Take by mouth as directed   Yes [provider]  PRESCRIPTION MEDICATION **Non-US market drug** Triohale MDI (tiotropium/formoterol/ciclesonide)  Inhale as directed   Yes [provider]  Fluticasone-Umeclidin-Vilant (TRELEGY ELLIPTA) 100-62.5-25 MCG/INH AEPB Inhale 1 puff into the lungs daily. Patient not taking: Reported on 12/02/2017 11/18/17   [name], MD      PHYSICAL EXAMINATION:   VITAL SIGNS: Blood pressure (!) 110/59, pulse (!) 110, temperature 98.3 F (36.8 C), temperature source Oral, resp. rate (!) 26, SpO2 100 %.  GENERAL:  72 y.o.-year-old patient lying in the bed with moderate respiratory distress.  On BiPAP. EYES: Pupils equal, round, reactive to light and accommodation. No scleral icterus.  HEENT: Head atraumatic, normocephalic.  Oropharynx and nasopharynx clear.  NECK:  Supple, no jugular venous distention. No thyroid enlargement, no tenderness.  LUNGS: Patient is very tachypneic.  On auscultation, reduced breath sounds and wheezing noted bilaterally.  Positive use of accessory muscles of respiration.  CARDIOVASCULAR: S1, S2 normal. No S3/S4.  ABDOMEN: Soft, nontender, nondistended. Bowel sounds present. No organomegaly or mass.  EXTREMITIES: No pedal edema, cyanosis, or clubbing.  NEUROLOGIC: Patient is moving all her extremities, no focal weakness appreciated. PSYCHIATRIC: The patient is alert and oriented x 3.  SKIN: No obvious rash, lesion, or ulcer.   LABORATORY PANEL:   CBC Recent Labs  Lab 12/02/17 2000  WBC 16.2*  HGB 13.4  HCT 40.0  PLT 303  MCV 83.2  MCH 27.8  MCHC 33.4  RDW 14.3  LYMPHSABS 4.4*  MONOABS 1.3*  EOSABS 1.4*  BASOSABS 0.2*   ------------------------------------------------------------------------------------------------------------------  Chemistries  Recent Labs  Lab 12/02/17 2000  NA 132*  K 6.0*  CL 102  CO2 20*  GLUCOSE 209*  BUN 15  CREATININE 0.96  CALCIUM 8.6*  AST 24  ALT 19  ALKPHOS 70  BILITOT 0.6   ------------------------------------------------------------------------------------------------------------------ CrCl cannot be calculated (Unknown ideal weight.). ------------------------------------------------------------------------------------------------------------------ No results for input(s): TSH, T4TOTAL, T3FREE, THYROIDAB in the last 72 hours.  Invalid input(s): FREET3   Coagulation profile No results for input(s): INR, PROTIME in the last 168 hours. ------------------------------------------------------------------------------------------------------------------- No results for input(s): DDIMER in the last 72  hours. -------------------------------------------------------------------------------------------------------------------  Cardiac Enzymes Recent Labs  Lab  12/02/17 2000  TROPONINI <0.03   ------------------------------------------------------------------------------------------------------------------ Invalid input(s): POCBNP  ---------------------------------------------------------------------------------------------------------------  Urinalysis    Component Value Date/Time   COLORURINE STRAW (A) 09/26/2017 0642   APPEARANCEUR CLEAR (A) 09/26/2017 0642   APPEARANCEUR Clear 12/25/2012 0411   LABSPEC 1.006 09/26/2017 0642   LABSPEC 1.006 12/25/2012 0411   PHURINE 5.0 09/26/2017 0642   GLUCOSEU 50 (A) 09/26/2017 0642   GLUCOSEU >=500 12/25/2012 0411   HGBUR NEGATIVE 09/26/2017 0642   BILIRUBINUR NEGATIVE 09/26/2017 0642   BILIRUBINUR Negative 12/25/2012 0411   KETONESUR NEGATIVE 09/26/2017 0642   PROTEINUR 30 (A) 09/26/2017 0642   NITRITE NEGATIVE 09/26/2017 0642   LEUKOCYTESUR NEGATIVE 09/26/2017 0642   LEUKOCYTESUR Negative 12/25/2012 0411     RADIOLOGY: Dg Chest Portable 1 View  Result Date: 12/02/2017 CLINICAL DATA:  Respiratory distress EXAM: PORTABLE CHEST 1 VIEW COMPARISON:  11/05/2017 FINDINGS: Normal heart size. Low lung volumes. Bibasilar atelectasis. No pneumothorax or pleural effusion. IMPRESSION: Bibasilar atelectasis Electronically Signed   By: [name] M.D.   On: 12/02/2017 20:16    EKG: Orders placed or performed during the hospital encounter of 12/02/17  . ED EKG within 10 minutes  . ED EKG within 10 minutes  . ED EKG  . ED EKG  . EKG 12-Lead  . EKG 12-Lead    IMPRESSION AND PLAN:   1.  Acute respiratory failure with hypoxia and hypercapnia, secondary to asthma/COPD exacerbation.  Will admit patient to stepdown unit.  Continue treatment with BiPAP, DuoNeb, and oxygen as needed to maintain oxygen saturation 92-95%.  We will start  antibiotics, doxycycline and its ceftriaxone for COPD exacerbation.  We will repeat ABG.  Continue to monitor clinically closely. 2.  Acute respiratory acidosis, see treatment as above under #1. 3.  Acute asthma/COPD exacerbation, see treatment as above under #1. 4.  Hyperkalemia, without EKG changes.  We will hold  ACEIs/ARBs.  We will start gentle IV hydration and repeat potassium level. 5.  Diabetes type 2.  We will hold p.o. meds for now.  Continue low-carb diet and monitor blood sugars before meals and at bedtime.  Will use insulin treatment during the hospital stay. 6.  Hypertension, stable.  We will continue most home BP medications, but will avoid ACEIs/ARBs, due to hyperkalemia  All the records are reviewed and case discussed with ED and ICU providers. Management plans discussed with the patient, family and they are in agreement.  CODE STATUS: Code Status History    Date Active Date Inactive Code Status Order ID Comments User Context   09/26/2017 0131 09/29/2017 1501 Full Code [id number]  Lance Coon, MD Inpatient       TOTAL TIME TAKING CARE OF THIS PATIENT: 50 minutes.    Amelia Jo M.D on 12/02/2017 at 10:21 PM  Between 7am to 6pm - Pager - [phone]  After 6pm go to [email or web address] - password [email or web address] [facility]  Office  [phone]  CC: Primary care physician; [name], MD

## 2017-12-02 NOTE — ED Triage Notes (Signed)
Pt arrived to ED with respiratory distress from home, accompanied by daughter. Daughter reports pt has had breathing difficulty x1 hour and no relief from home nebulizer treatments. Pt has hx/o COPD. MD at bedside. Respiratory called.

## 2017-12-03 ENCOUNTER — Other Ambulatory Visit: Payer: Self-pay

## 2017-12-03 DIAGNOSIS — J9602 Acute respiratory failure with hypercapnia: Secondary | ICD-10-CM

## 2017-12-03 DIAGNOSIS — E875 Hyperkalemia: Secondary | ICD-10-CM

## 2017-12-03 DIAGNOSIS — J45901 Unspecified asthma with (acute) exacerbation: Secondary | ICD-10-CM

## 2017-12-03 DIAGNOSIS — J9601 Acute respiratory failure with hypoxia: Secondary | ICD-10-CM

## 2017-12-03 LAB — BLOOD GAS, ARTERIAL
Acid-base deficit: 3.9 mmol/L — ABNORMAL HIGH (ref 0.0–2.0)
Bicarbonate: 22.6 mmol/L (ref 20.0–28.0)
Delivery systems: POSITIVE
Expiratory PAP: 6
FIO2: 0.3
Inspiratory PAP: 12
O2 Saturation: 97.3 %
PH ART: 7.3 — AB (ref 7.350–7.450)
Patient temperature: 37
RATE: 8 resp/min
pCO2 arterial: 46 mmHg (ref 32.0–48.0)
pO2, Arterial: 103 mmHg (ref 83.0–108.0)

## 2017-12-03 LAB — MRSA PCR SCREENING: MRSA by PCR: NEGATIVE

## 2017-12-03 LAB — GLUCOSE, CAPILLARY
GLUCOSE-CAPILLARY: 348 mg/dL — AB (ref 70–99)
GLUCOSE-CAPILLARY: 414 mg/dL — AB (ref 70–99)
GLUCOSE-CAPILLARY: 341 mg/dL — AB (ref 70–99)
GLUCOSE-CAPILLARY: 239 mg/dL — AB (ref 70–99)
GLUCOSE-CAPILLARY: 121 mg/dL — AB (ref 70–99)
GLUCOSE-CAPILLARY: 137 mg/dL — AB (ref 70–99)
Glucose-Capillary: 126 mg/dL — ABNORMAL HIGH (ref 70–99)
Glucose-Capillary: 141 mg/dL — ABNORMAL HIGH (ref 70–99)
Glucose-Capillary: 148 mg/dL — ABNORMAL HIGH (ref 70–99)
Glucose-Capillary: 174 mg/dL — ABNORMAL HIGH (ref 70–99)
Glucose-Capillary: 185 mg/dL — ABNORMAL HIGH (ref 70–99)
Glucose-Capillary: 220 mg/dL — ABNORMAL HIGH (ref 70–99)
Glucose-Capillary: 284 mg/dL — ABNORMAL HIGH (ref 70–99)
Glucose-Capillary: 413 mg/dL — ABNORMAL HIGH (ref 70–99)
Glucose-Capillary: 526 mg/dL (ref 70–99)

## 2017-12-03 LAB — BASIC METABOLIC PANEL
Anion gap: 10 (ref 5–15)
BUN: 19 mg/dL (ref 8–23)
CHLORIDE: 101 mmol/L (ref 98–111)
CO2: 19 mmol/L — ABNORMAL LOW (ref 22–32)
CREATININE: 0.84 mg/dL (ref 0.44–1.00)
Calcium: 7.9 mg/dL — ABNORMAL LOW (ref 8.9–10.3)
GFR calc Af Amer: >60 mL/min (ref >60)
GFR calc non Af Amer: >60 mL/min (ref >60)
Glucose, Bld: 370 mg/dL — ABNORMAL HIGH (ref 70–99)
Potassium: 6.1 mmol/L — ABNORMAL HIGH (ref 3.5–5.1)
SODIUM: 130 mmol/L — AB (ref 135–145)

## 2017-12-03 LAB — COMPREHENSIVE METABOLIC PANEL
ALBUMIN: 3.7 g/dL (ref 3.5–5.0)
ALT: 16 U/L (ref 0–44)
ANION GAP: 12 (ref 5–15)
AST: 19 U/L (ref 15–41)
Alkaline Phosphatase: 55 U/L (ref 38–126)
BUN: 17 mg/dL (ref 8–23)
CO2: 18 mmol/L — AB (ref 22–32)
Calcium: 8.4 mg/dL — ABNORMAL LOW (ref 8.9–10.3)
Chloride: 101 mmol/L (ref 98–111)
Creatinine, Ser: 0.95 mg/dL (ref 0.44–1.00)
GFR calc non Af Amer: 58 mL/min — ABNORMAL LOW (ref >60)
GLUCOSE: 318 mg/dL — AB (ref 70–99)
POTASSIUM: 7.1 mmol/L — AB (ref 3.5–5.1)
SODIUM: 131 mmol/L — AB (ref 135–145)
Total Bilirubin: 0.5 mg/dL (ref 0.3–1.2)
Total Protein: 6.8 g/dL (ref 6.5–8.1)

## 2017-12-03 LAB — PHOSPHORUS: Phosphorus: 3.6 mg/dL (ref 2.5–4.6)

## 2017-12-03 LAB — CBC
HCT: 36.2 % (ref 35.0–47.0)
Hemoglobin: 12.2 g/dL (ref 12.0–16.0)
MCH: 28 pg (ref 26.0–34.0)
MCHC: 33.6 g/dL (ref 32.0–36.0)
MCV: 83.2 fL (ref 80.0–100.0)
PLATELETS: 249 10*3/uL (ref 150–440)
RBC: 4.35 MIL/uL (ref 3.80–5.20)
RDW: 14.4 % (ref 11.5–14.5)
WBC: 8 10*3/uL (ref 3.6–11.0)

## 2017-12-03 LAB — LACTIC ACID, PLASMA: Lactic Acid, Venous: 1.7 mmol/L (ref 0.5–1.9)

## 2017-12-03 LAB — POTASSIUM
Potassium: 5.6 mmol/L — ABNORMAL HIGH (ref 3.5–5.1)
Potassium: 4.8 mmol/L (ref 3.5–5.1)

## 2017-12-03 LAB — PROCALCITONIN

## 2017-12-03 LAB — MAGNESIUM: Magnesium: 1.9 mg/dL (ref 1.7–2.4)

## 2017-12-03 MED ORDER — INSULIN REGULAR HUMAN 100 UNIT/ML IJ SOLN
10.0000 [IU] | Freq: Once | INTRAMUSCULAR | Status: AC
Start: 2017-12-03 — End: 2017-12-03
  Administered 2017-12-03: 10 [IU] via INTRAVENOUS
  Filled 2017-12-03 (×3): qty 0.1

## 2017-12-03 MED ORDER — METHYLPREDNISOLONE SODIUM SUCC 125 MG IJ SOLR: 60.0000 mg | Freq: Two times a day (BID) | INTRAMUSCULAR | Status: DC

## 2017-12-03 MED ORDER — METHYLPREDNISOLONE SODIUM SUCC 125 MG IJ SOLR
60.0000 mg | Freq: Three times a day (TID) | INTRAMUSCULAR | Status: DC
  Administered 2017-12-03: 60 mg via INTRAVENOUS
  Filled 2017-12-03: qty 2

## 2017-12-03 MED ORDER — SODIUM CHLORIDE 0.45 % IV SOLN
INTRAVENOUS | Status: DC
  Administered 2017-12-03: 18:00:00 via INTRAVENOUS

## 2017-12-03 MED ORDER — INSULIN DETEMIR 100 UNIT/ML ~~LOC~~ SOLN
10.0000 [IU] | Freq: Two times a day (BID) | SUBCUTANEOUS | Status: DC
  Administered 2017-12-04: 10 [IU] via SUBCUTANEOUS
  Filled 2017-12-03 (×2): qty 0.1

## 2017-12-03 MED ORDER — INSULIN REGULAR BOLUS VIA INFUSION
0.0000 [IU] | Freq: Three times a day (TID) | INTRAVENOUS | Status: DC
  Filled 2017-12-03: qty 10

## 2017-12-03 MED ORDER — DEXTROSE 50 % IV SOLN: 25.0000 mL | INTRAVENOUS | Status: DC | PRN

## 2017-12-03 MED ORDER — INSULIN REGULAR HUMAN 100 UNIT/ML IJ SOLN: 10.0000 [IU] | Freq: Once | INTRAMUSCULAR | Status: AC

## 2017-12-03 MED ORDER — INSULIN ASPART 100 UNIT/ML ~~LOC~~ SOLN: 3.0000 [IU] | SUBCUTANEOUS | Status: DC

## 2017-12-03 MED ORDER — GUAIFENESIN-DM 100-10 MG/5ML PO SYRP
5.0000 mL | ORAL_SOLUTION | ORAL | Status: DC | PRN
  Administered 2017-12-03 – 2017-12-05 (×5): 5 mL via ORAL
  Filled 2017-12-03 (×7): qty 5

## 2017-12-03 MED ORDER — SODIUM CHLORIDE 0.9 % IV SOLN
INTRAVENOUS | Status: DC
  Administered 2017-12-03: 4.7 [IU]/h via INTRAVENOUS
  Filled 2017-12-03: qty 1

## 2017-12-03 MED ORDER — ALUM & MAG HYDROXIDE-SIMETH 200-200-20 MG/5ML PO SUSP
30.0000 mL | Freq: Four times a day (QID) | ORAL | Status: DC | PRN
  Administered 2017-12-03: 30 mL via ORAL
  Filled 2017-12-03 (×3): qty 30

## 2017-12-03 MED ORDER — SODIUM CHLORIDE 0.9 % IV SOLN
INTRAVENOUS | Status: DC
  Filled 2017-12-03: qty 1

## 2017-12-03 MED ORDER — SODIUM CHLORIDE 0.9 % IV SOLN
500.0000 mg | INTRAVENOUS | Status: DC
  Administered 2017-12-04: 09:00:00 500 mg via INTRAVENOUS
  Filled 2017-12-03: qty 500

## 2017-12-03 MED ORDER — LEVOFLOXACIN IN D5W 250 MG/50ML IV SOLN: 250.0000 mg | INTRAVENOUS | Status: DC

## 2017-12-03 MED ORDER — DEXTROSE 50 % IV SOLN: 1.0000 | Freq: Once | INTRAVENOUS | Status: AC

## 2017-12-03 MED ORDER — PROMETHAZINE HCL 25 MG/ML IJ SOLN
6.2500 mg | Freq: Four times a day (QID) | INTRAMUSCULAR | Status: DC | PRN
  Administered 2017-12-03 – 2017-12-04 (×2): 6.25 mg via INTRAVENOUS
  Filled 2017-12-03 (×2): qty 1

## 2017-12-03 MED ORDER — LEVOFLOXACIN IN D5W 500 MG/100ML IV SOLN
500.0000 mg | Freq: Once | INTRAVENOUS | Status: AC
  Administered 2017-12-03: 500 mg via INTRAVENOUS
  Filled 2017-12-03: qty 100

## 2017-12-03 MED ORDER — METHYLPREDNISOLONE SODIUM SUCC 40 MG IJ SOLR
40.0000 mg | Freq: Two times a day (BID) | INTRAMUSCULAR | Status: DC
  Administered 2017-12-03 – 2017-12-04 (×2): 40 mg via INTRAVENOUS
  Filled 2017-12-03 (×2): qty 1

## 2017-12-03 MED ORDER — DEXTROSE 50 % IV SOLN
50.0000 mL | Freq: Once | INTRAVENOUS | Status: AC
  Administered 2017-12-03: 50 mL via INTRAVENOUS
  Filled 2017-12-03: qty 50

## 2017-12-03 NOTE — Progress Notes (Signed)
Spoke with Burman Nieves, NP regarding patient's potassium level of 6.  Orders placed by NP.  Will continue to monitor.

## 2017-12-03 NOTE — Progress Notes (Signed)
ANTIBIOTIC CONSULT NOTE - INITIAL  Pharmacy Consult for Levaquin  Indication: COPD exacerbation  No Known Allergies  Patient Measurements: Height: 5\' 1"  (154.9 cm) Weight: 170 lb 13.7 oz (77.5 kg) IBW/kg (Calculated) : 47.8 Adjusted Body Weight:   Vital Signs: Temp: 97.7 F (36.5 C) (06/24 2313) Temp Source: Axillary (06/24 2313) BP: 167/90 (06/25 0500) Pulse Rate: 116 (06/25 0500) Intake/Output from previous day: 06/24 0701 - 06/25 0700 In: 100 [IV Piggyback:100] Out: -  Intake/Output from this shift: Total I/O In: 100 [IV Piggyback:100] Out: -   Labs: Recent Labs    12/02/17 2000  WBC 16.2*  HGB 13.4  PLT 303  CREATININE 0.96   Estimated Creatinine Clearance: 49.2 mL/min (by C-G formula based on SCr of 0.96 mg/dL). No results for input(s): VANCOTROUGH, VANCOPEAK, VANCORANDOM, GENTTROUGH, GENTPEAK, GENTRANDOM, TOBRATROUGH, TOBRAPEAK, TOBRARND, AMIKACINPEAK, AMIKACINTROU, AMIKACIN in the last 72 hours.   Microbiology: No results found for this or any previous visit (from the past 720 hour(s)).  Medical History: Past Medical History:  Diagnosis Date  . Asthma   . Cardiomegaly   . Diabetes mellitus without complication ([place])   . Hypertension   . TB (tuberculosis)     Medications:  Medications Prior to Admission  Medication Sig Dispense Refill Last Dose  . albuterol (PROVENTIL HFA;VENTOLIN HFA) 108 (90 Base) MCG/ACT inhaler Inhale 1-2 puffs into the lungs every 6 (six) hours as needed for wheezing or shortness of breath. 1 Inhaler 11 prn at prn  . albuterol (PROVENTIL) (2.5 MG/3ML) 0.083% nebulizer solution Take 3 mLs (2.5 mg total) by nebulization every 6 (six) hours as needed for wheezing or shortness of breath. 75 mL 12 prn at prn  . fluticasone furoate-vilanterol (BREO ELLIPTA) 200-25 MCG/INH AEPB Inhale 1 puff into the lungs daily. 60 each 2 unknown at unknown  . montelukast (SINGULAIR) 10 MG tablet Take 1 tablet (10 mg total) by mouth at bedtime. 30  tablet 2 unknown at unknown  . PRESCRIPTION MEDICATION **Non-US market drug** Tenebite (teneligliptin)  1 tablet by mouth every morning   unknown at unknown  . PRESCRIPTION MEDICATION **Non-US market drug** Amaryl MV2 (metformin/glipizide/voglibose) 1 tablet by mouth twice daily before meals   unknown at unknonw  . PRESCRIPTION MEDICATION **Non-US market drug** Carca CR20 (carvedilol controlled release) Take 1 tablet by mouth every evening   unknown at unknown  . PRESCRIPTION MEDICATION **Non-US market drug** Eritel CH40 (telmesartan/chlorthaladone) Take 1 tablet by mouth every morning after breakfast   unknown at unknown  . PRESCRIPTION MEDICATION **Non-US market drug** Zyrova C (rosuvastatin/clopidigrel) Take 1 capsule by mouth every evening   unknown at unknown  . PRESCRIPTION MEDICATION **Non-US market drug** Mashyne (cholecalciferol) Take 1 tablet by mouth every day after lunch   unknown at unknown  . PRESCRIPTION MEDICATION **Non-US market drug** Rabiprime DSR (rabeprazole/domperidone) Take 1 capsule by mouth every morning as directed   unknonw at unknown  . PRESCRIPTION MEDICATION **Non-US market drug** Volken (multivitamin with green tea extract) Take 1 tablet every morning   unknown at unknown  . PRESCRIPTION MEDICATION **Non-US market drug** Betavert 24 (betahistine)  Take 1 tablet by mouth every morning as directed   unknown at unknown  . PRESCRIPTION MEDICATION **Non-US market drug** Bro Zedex SF (guaifenesin/terbutaline/bromhexine) liquid Take by mouth as directed   unknown at unknown  . PRESCRIPTION MEDICATION **Non-US market drug** Alerfix M (levocetirizine/montelukast) Take 1 tablet by mouth every morning as directed   unknown at unknown  . PRESCRIPTION MEDICATION **Non-US market drug** Alprax 0.5 (alprazolam)  Take by mouth as directed   unknown at unknown  . PRESCRIPTION MEDICATION **Non-US market drug** Triohale MDI (tiotropium/formoterol/ciclesonide)  Inhale  as directed   unknown at unknown  . Fluticasone-Umeclidin-Vilant (TRELEGY ELLIPTA) 100-62.5-25 MCG/INH AEPB Inhale 1 puff into the lungs daily. (Patient not taking: Reported on 12/02/2017) 1 each 11 Not Taking at Unknown time   Assessment: CrCl = 49.2 ml/min  Goal of Therapy:  resolution of infection  Plan:  Expected duration 7 days with resolution of temperature and/or normalization of WBC   Will order Levaquin 500 mg IV X 1 to start on 6/25 @ 0545 followed by levaquin 250 mg IV Q24H to start 6/26 @ 0545.   Chayna Surratt D 12/03/2017,5:34 AM

## 2017-12-03 NOTE — Progress Notes (Signed)
Spoke with Burman Nieves, NP, told verbally to give D50 amp and 10 units of insulin only if pt's potassium level was still elevated.  Will continue to monitor.

## 2017-12-03 NOTE — Care Management (Addendum)
Confidential message left for patient's daughter Sherron Flemings [phone] to check on Trilogy and other DME in the home. RNCM will follow. Update at 68A: RNCM received text message from patient's daughter stating that patient only has a nebulizer at home- no Bipap and no Oxygen. [name] feels home O2 would help patient. [name] states that she lives with patient; independent with mobility but gets short of breath and feels a walker would help her. Rolling walker requested from Dr. Benjie Karvonen and Corene Cornea with [facility] (to deliver at time of discharge). Her PCP is with [facility] [phone]- pending appointment for establishment. 01/08/18 at 245P. They cannot make a sooner appointment. She is established with [facility]- Ramashundran.  [name] drives her to appointments. She feels home Bipap or trilogy would help patient- sent to [facility] with [facility] for review. Update 1130A: patient not longer requiring supplemental O2 and did not qualify for home bipap. Daughter has been updated. RNCM understand that patient receives some of her medications from [place]. She is not on home insulin per daughter.

## 2017-12-03 NOTE — Progress Notes (Signed)
Pt taken off bipap, tolerating well at this time, RN notified. Will continue to monitor

## 2017-12-03 NOTE — Progress Notes (Signed)
Pharmacy Antibiotic Note  Whitney Fleming is a 72 y.o. female admitted on 12/02/2017 with COPD exacerbation.  Pharmacy has been consulted for azithromycin dosing.  Plan: Per AM ICU rounds, will discontinue Levaquin and start azithromycin 500mg  IV q24h on 6/26 at 1000.   Height: 5\' 1"  (154.9 cm) Weight: 170 lb 13.7 oz (77.5 kg) IBW/kg (Calculated) : 47.8  Temp (24hrs), Avg:98 F (36.7 C), Min:97.7 F (36.5 C), Max:98.3 F (36.8 C)  Recent Labs  Lab 12/02/17 2000 12/03/17 0005 12/03/17 0552  WBC 16.2*  --  8.0  CREATININE 0.96 0.95 0.84  LATICACIDVEN  --  1.7  --     Estimated Creatinine Clearance: 56.2 mL/min (by C-G formula based on SCr of 0.84 mg/dL).    No Known Allergies  Antimicrobials this admission: 6/24 doxycycline x1 6/25 ceftriaxone x1 6/25 levofloxacin x1 6/25 azithromycin >>    Microbiology results: 6/24 BCx: no growth <12 hours 6/25 MRSA PCR: negative  Thank you for allowing pharmacy to be a part of this patient's care.  Tyson Babinski 12/03/2017 12:55 PM

## 2017-12-03 NOTE — Consult Note (Signed)
PULMONARY / CRITICAL CARE MEDICINE   Name: Whitney Fleming MRN: [id number] DOB: 1945-09-09    ADMISSION DATE:  12/02/2017   CONSULTATION DATE:  12/03/2017  REFERRING MD: Dr. Duane Boston  Reason: Acute hypoxic respiratory failure  HISTORY OF PRESENT ILLNESS:   This is a 72 year old female with a history of asthma, type 2 diabetes, hypertension and TB who presented to the ED with worsening shortness of breath that started today.  Patient is not a native [place] speaker hence history is obtained from patient's daughter.  Her daughter, patient started requiring more of her rescue inhaler over the past 2 days.  This evening she became suddenly short of breath hence she was brought to the ED for evaluation.  At the ED, patient was hypoxic with SPO2 in the 80s.  She was equally tachypneic and was wheezing with profoundly diminished breath sounds.  She was placed on BiPAP and started on nebulizer treatments.  Her chest x-ray was negative.  Labs showed a WBC of 16 K, potassium level of 6.0, glucose of 209, sodium of 132.  Her VBG showed a pH of 7.18, PCO2 of 66 and a PO2 of 88.  She is being admitted to the ICU for further management of acute respiratory failure.  Patient is being [facility] by Dr. Ashby Dawes.  This is her third exacerbation since April of this year.  During her last visit with pulmonologist, she was prescribed trilogy trelegy Ellipta, Singulair and Proventil.  Patient also has medications that she brought from a brought but is unclear if she is still using these medications.  PAST MEDICAL HISTORY :  She  has a past medical history of Asthma, Cardiomegaly, Diabetes mellitus without complication ([place]), Hypertension, and TB (tuberculosis).  PAST SURGICAL HISTORY: She  has a past surgical history that includes No past surgeries.  No Known Allergies  No current facility-administered medications on file prior to encounter.    Current Outpatient Medications on File Prior to  Encounter  Medication Sig  . albuterol (PROVENTIL HFA;VENTOLIN HFA) 108 (90 Base) MCG/ACT inhaler Inhale 1-2 puffs into the lungs every 6 (six) hours as needed for wheezing or shortness of breath.  Marland Kitchen albuterol (PROVENTIL) (2.5 MG/3ML) 0.083% nebulizer solution Take 3 mLs (2.5 mg total) by nebulization every 6 (six) hours as needed for wheezing or shortness of breath.  . fluticasone furoate-vilanterol (BREO ELLIPTA) 200-25 MCG/INH AEPB Inhale 1 puff into the lungs daily.  . montelukast (SINGULAIR) 10 MG tablet Take 1 tablet (10 mg total) by mouth at bedtime.  Marland Kitchen PRESCRIPTION MEDICATION **Non-US market drug** Tenebite (teneligliptin)  1 tablet by mouth every morning  . PRESCRIPTION MEDICATION **Non-US market drug** Amaryl MV2 (metformin/glipizide/voglibose) 1 tablet by mouth twice daily before meals  . PRESCRIPTION MEDICATION **Non-US market drug** Carca CR20 (carvedilol controlled release) Take 1 tablet by mouth every evening  . PRESCRIPTION MEDICATION **Non-US market drug** Eritel CH40 (telmesartan/chlorthaladone) Take 1 tablet by mouth every morning after breakfast  . PRESCRIPTION MEDICATION **Non-US market drug** Zyrova C (rosuvastatin/clopidigrel) Take 1 capsule by mouth every evening  . PRESCRIPTION MEDICATION **Non-US market drug** Mashyne (cholecalciferol) Take 1 tablet by mouth every day after lunch  . PRESCRIPTION MEDICATION **Non-US market drug** Rabiprime DSR (rabeprazole/domperidone) Take 1 capsule by mouth every morning as directed  . PRESCRIPTION MEDICATION **Non-US market drug** Volken (multivitamin with green tea extract) Take 1 tablet every morning  . PRESCRIPTION MEDICATION **Non-US market drug** Betavert 24 (betahistine)  Take 1 tablet by mouth every morning as directed  .  PRESCRIPTION MEDICATION **Non-US market drug** Bro Zedex SF (guaifenesin/terbutaline/bromhexine) liquid Take by mouth as directed  . PRESCRIPTION MEDICATION **Non-US market drug** Alerfix M  (levocetirizine/montelukast) Take 1 tablet by mouth every morning as directed  . PRESCRIPTION MEDICATION **Non-US market drug** Alprax 0.5 (alprazolam) Take by mouth as directed  . PRESCRIPTION MEDICATION **Non-US market drug** Triohale MDI (tiotropium/formoterol/ciclesonide)  Inhale as directed  . Fluticasone-Umeclidin-Vilant (TRELEGY ELLIPTA) 100-62.5-25 MCG/INH AEPB Inhale 1 puff into the lungs daily. (Patient not taking: Reported on 12/02/2017)    FAMILY HISTORY:  Her indicated that the status of her other is unknown.   SOCIAL HISTORY: She  reports that she has never smoked. She has never used smokeless tobacco. She reports that she does not drink alcohol or use drugs.  REVIEW OF SYSTEMS:   Unable to obtain as patient is on continuous BiPAP and still tachypneic  SUBJECTIVE:   VITAL SIGNS: BP (!) 167/90   Pulse (!) 116   Temp 97.7 F (36.5 C) (Axillary)   Resp (!) 26   Ht 5\' 1"  (1.549 m)   Wt 170 lb 13.7 oz (77.5 kg)   SpO2 100%   BMI 32.28 kg/m   HEMODYNAMICS:    VENTILATOR SETTINGS: FiO2 (%):  [30 %] 30 %  INTAKE / OUTPUT: No intake/output data recorded.  PHYSICAL EXAMINATION: General: Moderate respiratory distress Neuro: Awake, following commands, moves all extremities HEENT: PERRLA, trachea midline, no JVD Cardiovascular: Apical pulse tachycardic, regular, S1-S2, no murmur regurg or gallop, +2 pulses bilaterally, no edema  Respiratory: Moderate respiratory distress, bilateral breath sounds diminished in all lung fields, mild expiratory wheezes Abdomen: Nondistended, normal bowel sounds in all 4 quadrants, palpation reveals no organomegaly Musculoskeletal: No joint deformities, positive range of motion in upper and lower extremities Skin: Warm and dry  LABS:  BMET Recent Labs  Lab 12/02/17 2000  NA 132*  K 6.0*  CL 102  CO2 20*  BUN 15  CREATININE 0.96  GLUCOSE 209*    Electrolytes Recent Labs  Lab 12/02/17 2000 12/03/17 0005  CALCIUM  8.6*  --   MG  --  1.9  PHOS  --  3.6    CBC Recent Labs  Lab 12/02/17 2000  WBC 16.2*  HGB 13.4  HCT 40.0  PLT 303    Coag's No results for input(s): APTT, INR in the last 168 hours.  Sepsis Markers Recent Labs  Lab 12/03/17 0005  LATICACIDVEN 1.7    ABG Recent Labs  Lab 12/03/17 0307  PHART 7.30*  PCO2ART 46  PO2ART 103    Liver Enzymes Recent Labs  Lab 12/02/17 2000  AST 24  ALT 19  ALKPHOS 70  BILITOT 0.6  ALBUMIN 4.3    Cardiac Enzymes Recent Labs  Lab 12/02/17 2000  TROPONINI <0.03    Glucose Recent Labs  Lab 12/02/17 2350  GLUCAP 300*    Imaging Dg Chest Portable 1 View  Result Date: 12/02/2017 CLINICAL DATA:  Respiratory distress EXAM: PORTABLE CHEST 1 VIEW COMPARISON:  11/05/2017 FINDINGS: Normal heart size. Low lung volumes. Bibasilar atelectasis. No pneumothorax or pleural effusion. IMPRESSION: Bibasilar atelectasis Electronically Signed   By: [name] M.D.   On: 12/02/2017 20:16    STUDIES:  Last echo was September 26, 2017: LV EF: 55% -   60%  CULTURES: Blood cultures x2  ANTIBIOTICS: Levaquin for COPD exacerbation Ceftriaxone and doxycycline discontinued  DISCUSSION: 72 year old female presenting with acute hypoxic/hypercarbic respiratory failure secondary to acute COPD exacerbation  ASSESSMENT  Acute hypoxic/hypercarbic respiratory failure  Acute asthma/COPD exacerbation Hyperkalemia   PLAN Continuous BiPAP and titrate to nasal cannula as tolerated IV steroids Nebulized bronchodilators ABG PRN Levaquin for COPD exacerbation Inhaled corticosteroids Monitor and correct electrolyte imbalances Resume all home medications GI and DVT prophylaxis  FAMILY  - Updates: Patient and daughter updated at [place] ANP-BC Pulmonary and Critical Care Medicine [facility] Pager [phone] or [phone]  NB: This document was prepared using Dragon voice recognition software and may include  unintentional dictation errors.    12/03/2017, 5:23 AM

## 2017-12-03 NOTE — Progress Notes (Signed)
Notified Dr. Celesta Aver of critical potassium level 7.1. New orders received, see EMAR.

## 2017-12-03 NOTE — Progress Notes (Signed)
Inpatient Diabetes Program Recommendations  AACE/ADA: New Consensus Statement on Inpatient Glycemic Control (2015)  Target Ranges:  Prepandial:   less than 140 mg/dL      Peak postprandial:   less than 180 mg/dL (1-2 hours)      Critically ill patients:  140 - 180 mg/dL   Results for GERALINE, HALBERSTADT (MRN [id number]) as of 12/03/2017 09:29  Ref. Range 12/02/2017 23:50 12/03/2017 06:26 12/03/2017 08:04 12/03/2017 09:20  Glucose-Capillary Latest Ref Range: 70 - 99 mg/dL 300 (H)  3 units NOVOLOG  348 (H) 526 (HH)  10 units REGULAR at 7:44am + 50 ml D50%  IV Insulin Drip started 413 (H)       IV Insulin Drip    Admit with: Acute hypoxic respiratory failure due to COPD  History: DM  Home DM Meds: **Non-US market drug**       Amaryl MV2 (metformin/glipizide/voglibose)       1 tablet by mouth twice daily before meals       **Non-US market drug**      Tenebite (teneligliptin)       1 tablet by mouth every morning  Current Insulin Orders: Novolog Sensitive Correction Scale/ SSI (0-9 units) TID AC + HS      IV Insulin Drip     Patient received 125 mg Solumedrol at 8pm last night.  Now has orders for Solumedrol 60 mg Q8 hours.     MD- While patient on IV Insulin Drip, please discontinue orders for Novolog Sensitive Correction Scale/ SSI (0-9 units) TID AC + HS for now  Note patient has orders for solid PO diet.  If patient allowed to eat while on the IV Insulin drip, please make sure the nurses are using the Carbohydrate Coverage function of the GlucoStabilizer to cover the carbohydrates patients consumes with meals while on the IV Insulin drip.     --Will follow patient during hospitalization--  Wyn Quaker RN, MSN, CDE Diabetes Coordinator Inpatient Glycemic Control Team Team Pager: [phone] (8a-5p)

## 2017-12-03 NOTE — Progress Notes (Signed)
Dr. Celesta Aver gave order to NOT give coreg this morning.

## 2017-12-03 NOTE — Progress Notes (Signed)
Chaplain received OR for AD. Chaplain went to visit PT and PT did not speak [place].Chaplain ask front desk who was the nurse and the desk clerk said [place], but she is at lunch. Chaplain explain she was there for OR for AD. Chaplain asked what time would [place] return and nurse at desk said she should be on the way back. Chaplain said she would be back.

## 2017-12-03 NOTE — Progress Notes (Signed)
RN made Dr. Celesta Aver aware that patient has persistent cough.  MD gave order for PRN cough med.

## 2017-12-03 NOTE — Progress Notes (Addendum)
Whitney Fleming NAME: Whitney Fleming    MR#:  [id number]  DATE OF BIRTH:  19-Feb-1946  SUBJECTIVE:   Patient is off BiPAP.  Family is asking if patient can go home today.  Shortness of breath has improved denies wheezing  REVIEW OF SYSTEMS:    Review of Systems  Constitutional: Negative for fever, chills weight loss HENT: Negative for ear pain, nosebleeds, congestion, facial swelling, rhinorrhea, neck pain, neck stiffness and ear discharge.   Respiratory: Negative for cough, shortness of breath, wheezing  Cardiovascular: Negative for chest pain, palpitations and leg swelling.  Gastrointestinal: Negative for heartburn, abdominal pain, vomiting, diarrhea or consitpation Genitourinary: Negative for dysuria, urgency, frequency, hematuria Musculoskeletal: Negative for back pain or joint pain Neurological: Negative for dizziness, seizures, syncope, focal weakness,  numbness and headaches.  Hematological: Does not bruise/bleed easily.  Psychiatric/Behavioral: Negative for hallucinations, confusion, dysphoric mood    Tolerating Diet: yes      DRUG ALLERGIES:  No Known Allergies  VITALS:  Blood pressure (!) 153/71, pulse (!) 115, temperature 98.1 F (36.7 C), temperature source Axillary, resp. rate (!) 26, height 5\' 1"  (1.549 m), weight 77.5 kg (170 lb 13.7 oz), SpO2 92 %.  PHYSICAL EXAMINATION:  Constitutional: Appears well-developed and well-nourished. No distress. HENT: Normocephalic. Marland Kitchen Oropharynx is clear and moist.  Eyes: Conjunctivae and EOM are normal. PERRLA, no scleral icterus.  Neck: Normal ROM. Neck supple. No JVD. No tracheal deviation. CVS: RRR, S1/S2 +, no murmurs, no gallops, no carotid bruit.  Pulmonary: Effort and breath sounds normal, no stridor, rhonchi, wheezes, rales.  Abdominal: Soft. BS +,  no distension, tenderness, rebound or guarding.  Musculoskeletal: Normal range of motion. No edema and no tenderness.  Neuro:  Alert. CN 2-12 grossly intact. No focal deficits. Skin: Skin is warm and dry. No rash noted. Psychiatric: Normal mood and affect.      LABORATORY PANEL:   CBC Recent Labs  Lab 12/03/17 0552  WBC 8.0  HGB 12.2  HCT 36.2  PLT 249   ------------------------------------------------------------------------------------------------------------------  Chemistries  Recent Labs  Lab 12/03/17 0005 12/03/17 0552  NA 131* 130*  K 7.1* 6.1*  CL 101 101  CO2 18* 19*  GLUCOSE 318* 370*  BUN 17 19  CREATININE 0.95 0.84  CALCIUM 8.4* 7.9*  MG 1.9  --   AST 19  --   ALT 16  --   ALKPHOS 55  --   BILITOT 0.5  --    ------------------------------------------------------------------------------------------------------------------  Cardiac Enzymes Recent Labs  Lab 12/02/17 2000  TROPONINI <0.03   ------------------------------------------------------------------------------------------------------------------  RADIOLOGY:  Dg Chest Portable 1 View  Result Date: 12/02/2017 CLINICAL DATA:  Respiratory distress EXAM: PORTABLE CHEST 1 VIEW COMPARISON:  11/05/2017 FINDINGS: Normal heart size. Low lung volumes. Bibasilar atelectasis. No pneumothorax or pleural effusion. IMPRESSION: Bibasilar atelectasis Electronically Signed   By: Whitney Fleming M.D.   On: 12/02/2017 20:16     ASSESSMENT AND PLAN:   72 year old female with a history of diabetes, essential hypertension and asthma who presents with shortness of breath.   1.  Acute hypoxic/hypercarbic respiratory failure in the setting of asthma exacerbation:  Patient is off BiPAP and on room air  2.  Asthma exacerbation: Continue azithromycin, bronchodilators and wean IV steroids  3  Diabetes: Currently on insulin drip  4.  Essential hypertension: Continue Coreg  5.  Hyponatremia: Corrects to 133 with elevated blood sugar Repeat in a.m.  6.  Hyperkalemia: This should be  treated with Kayexalate, insulin dextrose  Probably  transfer to floor later this afternoon with physical therapy consultation  Management plans discussed with the patient and family and they are in agreement.  CODE STATUS: Full  TOTAL TIME TAKING CARE OF THIS PATIENT: 29 minutes.     POSSIBLE D/C 1 to 2 days, DEPENDING ON CLINICAL CONDITION.   Whitney Fleming M.D on 12/03/2017 at 12:05 PM  Between 7am to 6pm - Pager - [phone] After 6pm go to [email or web address] - password [email or web address] [facility]  Office  [phone]  CC: Primary care physician; Whitney Crews, MD  Note: This dictation was prepared with Dragon dictation along with smaller phrase technology. Any transcriptional errors that result from this process are unintentional.

## 2017-12-03 NOTE — Progress Notes (Signed)
Family Meeting Note  Advance Directive:no  Today a meeting took place with the Patient.and family   The following clinical team members were present during this meeting:MD  The following were discussed:Patient's diagnosis acute respiratory failure with asthma exacerbation: , Patient's progosis: Unable to determine and Goals for treatment: Full Code  Additional follow-up to be provided: Chaplain consultation to start advanced directives  Time spent during discussion: 16 minutes  Aide Wojnar, MD

## 2017-12-04 ENCOUNTER — Inpatient Hospital Stay: Payer: Medicaid Other

## 2017-12-04 ENCOUNTER — Encounter: Payer: Self-pay | Admitting: Internal Medicine

## 2017-12-04 LAB — GLUCOSE, CAPILLARY
GLUCOSE-CAPILLARY: 368 mg/dL — AB (ref 70–99)
GLUCOSE-CAPILLARY: 424 mg/dL — AB (ref 70–99)
Glucose-Capillary: 150 mg/dL — ABNORMAL HIGH (ref 70–99)
Glucose-Capillary: 166 mg/dL — ABNORMAL HIGH (ref 70–99)
Glucose-Capillary: 192 mg/dL — ABNORMAL HIGH (ref 70–99)
Glucose-Capillary: 195 mg/dL — ABNORMAL HIGH (ref 70–99)
Glucose-Capillary: 323 mg/dL — ABNORMAL HIGH (ref 70–99)
Glucose-Capillary: 359 mg/dL — ABNORMAL HIGH (ref 70–99)
Glucose-Capillary: 413 mg/dL — ABNORMAL HIGH (ref 70–99)

## 2017-12-04 LAB — BASIC METABOLIC PANEL
ANION GAP: 12 (ref 5–15)
BUN: 21 mg/dL (ref 8–23)
CHLORIDE: 95 mmol/L — AB (ref 98–111)
CO2: 23 mmol/L (ref 22–32)
Calcium: 8.5 mg/dL — ABNORMAL LOW (ref 8.9–10.3)
Creatinine, Ser: 1.11 mg/dL — ABNORMAL HIGH (ref 0.44–1.00)
GFR calc Af Amer: 56 mL/min — ABNORMAL LOW (ref >60)
GFR calc non Af Amer: 48 mL/min — ABNORMAL LOW (ref >60)
GLUCOSE: 249 mg/dL — AB (ref 70–99)
POTASSIUM: 5.3 mmol/L — AB (ref 3.5–5.1)
Sodium: 130 mmol/L — ABNORMAL LOW (ref 135–145)

## 2017-12-04 MED ORDER — INSULIN DETEMIR 100 UNIT/ML ~~LOC~~ SOLN
15.0000 [IU] | Freq: Every day | SUBCUTANEOUS | Status: DC
  Administered 2017-12-04 – 2017-12-05 (×2): 15 [IU] via SUBCUTANEOUS
  Filled 2017-12-04 (×2): qty 0.15

## 2017-12-04 MED ORDER — INSULIN ASPART 100 UNIT/ML ~~LOC~~ SOLN
20.0000 [IU] | Freq: Once | SUBCUTANEOUS | Status: AC
  Administered 2017-12-04: 18:00:00 20 [IU] via SUBCUTANEOUS
  Filled 2017-12-04: qty 1

## 2017-12-04 MED ORDER — METHYLPREDNISOLONE SODIUM SUCC 40 MG IJ SOLR
40.0000 mg | Freq: Every day | INTRAMUSCULAR | Status: DC
  Administered 2017-12-05: 08:00:00 40 mg via INTRAVENOUS
  Filled 2017-12-04: qty 1

## 2017-12-04 MED ORDER — DEXTROSE 50 % IV SOLN
50.0000 mL | Freq: Once | INTRAVENOUS | Status: AC
  Administered 2017-12-04: 09:00:00 50 mL via INTRAVENOUS
  Filled 2017-12-04: qty 50

## 2017-12-04 MED ORDER — AZITHROMYCIN 500 MG PO TABS
500.0000 mg | ORAL_TABLET | Freq: Every day | ORAL | Status: DC
  Administered 2017-12-05: 08:00:00 500 mg via ORAL
  Filled 2017-12-04: qty 1

## 2017-12-04 MED ORDER — INSULIN ASPART 100 UNIT/ML IV SOLN: 4.0000 [IU] | Freq: Three times a day (TID) | INTRAVENOUS | Status: DC

## 2017-12-04 MED ORDER — LEVALBUTEROL HCL 1.25 MG/0.5ML IN NEBU
1.2500 mg | INHALATION_SOLUTION | Freq: Four times a day (QID) | RESPIRATORY_TRACT | Status: DC
  Administered 2017-12-04 – 2017-12-05 (×5): 1.25 mg via RESPIRATORY_TRACT
  Filled 2017-12-04 (×6): qty 0.5

## 2017-12-04 MED ORDER — SODIUM POLYSTYRENE SULFONATE 15 GM/60ML PO SUSP
30.0000 g | Freq: Once | ORAL | Status: AC
  Administered 2017-12-04: 09:00:00 30 g via ORAL
  Filled 2017-12-04: qty 120

## 2017-12-04 MED ORDER — INSULIN ASPART 100 UNIT/ML ~~LOC~~ SOLN
0.0000 [IU] | Freq: Three times a day (TID) | SUBCUTANEOUS | Status: DC
  Administered 2017-12-04: 20 [IU] via SUBCUTANEOUS
  Administered 2017-12-05: 4 [IU] via SUBCUTANEOUS
  Administered 2017-12-05: 12:00:00 20 [IU] via SUBCUTANEOUS
  Filled 2017-12-04 (×3): qty 1

## 2017-12-04 MED ORDER — INSULIN ASPART 100 UNIT/ML ~~LOC~~ SOLN
4.0000 [IU] | Freq: Three times a day (TID) | SUBCUTANEOUS | Status: DC
  Administered 2017-12-04 – 2017-12-05 (×3): 4 [IU] via SUBCUTANEOUS
  Filled 2017-12-04 (×3): qty 1

## 2017-12-04 MED ORDER — CARVEDILOL 3.125 MG PO TABS
12.5000 mg | ORAL_TABLET | Freq: Two times a day (BID) | ORAL | Status: DC
  Administered 2017-12-04 – 2017-12-05 (×3): 12.5 mg via ORAL
  Filled 2017-12-04 (×3): qty 4

## 2017-12-04 MED ORDER — INSULIN REGULAR HUMAN 100 UNIT/ML IJ SOLN
10.0000 [IU] | Freq: Once | INTRAMUSCULAR | Status: AC
  Administered 2017-12-04: 09:00:00 10 [IU] via INTRAVENOUS
  Filled 2017-12-04 (×2): qty 0.1

## 2017-12-04 NOTE — Plan of Care (Signed)
VSS. Denies pain, nausea improved per pt. No emesis. Fair appetite. In am potassium 5.3, Kayexalate given. Pt had 3xBM during the shift. Pt c/o of abdomen feels bloated, but improved post having BM. Ambulated independently to bathroom.

## 2017-12-04 NOTE — Progress Notes (Signed)
Inpatient Diabetes Program Recommendations  AACE/ADA: New Consensus Statement on Inpatient Glycemic Control (2019)  Target Ranges:  Prepandial:   less than 140 mg/dL      Peak postprandial:   less than 180 mg/dL (1-2 hours)      Critically ill patients:  140 - 180 mg/dL  Results for MELAYA, HOSELTON (MRN [id number]) as of 12/04/2017 10:14  Ref. Range 12/03/2017 23:33 12/04/2017 00:50 12/04/2017 01:51 12/04/2017 02:27 12/04/2017 03:33 12/04/2017 08:31  Glucose-Capillary Latest Ref Range: 70 - 99 mg/dL 174 (H) 192 (H)  Levemir 10 units @ 1:01am 166 (H) 150 (H) 195 (H) 323 (H)  Regular 10 units IV @ 9:24  Results for LAISA, LARRICK (MRN [id number]) as of 12/04/2017 10:14  Ref. Range 09/27/2017 04:32  Hemoglobin A1C Latest Ref Range: 4.8 - 5.6 % 8.1 (H)   Review of Glycemic Control  Diabetes history: DM2 Outpatient Diabetes medications:  **Non-US market drug**                             Amaryl MV2 (metformin/glipizide/voglibose)                             1 tablet by mouth twice daily before meals                             **Non-US market drug**                            Tenebite (teneligliptin)                             1 tablet by mouth every morning  Current orders for Inpatient glycemic control: Novolog 0-20 units TID with meals; Solumedrol 40 mg Q12H  Inpatient Diabetes Program Recommendations:  Insulin - Basal: Noted patient received one time order of Levemir 10 units at 1:01 am today. Please consider ordering Levemir 15 units QHS. Correction (SSI): Please consider ordering Novolog 0-5 units QHS for bedtime correction scale. Insulin - Meal Coverage: If steroids are continued, please consider ordering Novolog 5 units TID with meals for meal coverage if patient eats at least 50% of meals.  Thanks, Barnie Alderman, RN, MSN, CDE Diabetes Coordinator Inpatient Diabetes Program [phone] (Team Pager from 8am to 5pm)

## 2017-12-04 NOTE — Progress Notes (Signed)
CRITICAL VALUE ALERT  Critical Value:  CBG 413. 2nd recheck CBG 424. CBG checked post dinner.  Date & Time Notied:  12/04/17 1805  Provider Notified: Dr Dustin Flock  Orders Received/Actions taken: Per MD to give 20units novolog once. Also to give 4units novolog ordered with meals.

## 2017-12-04 NOTE — Progress Notes (Signed)
* [facility]     Assessment and Plan:  Acute Asthma Exacerbation, doing better.  Acute hypoxic and hypercapnic respiratory failure. Recurrent exacerbations, at high risk of rehospitalization and complications.  - Converted to inhaled and oral steroids at the time of discharge with long-acting beta agonist. - Follow-up outpatient in 1 to 2 weeks.    Date: 12/04/2017  MRN# [id number] Whitney Fleming [birth year]   Whitney Fleming is a 72 y.o. old female seen in follow up for chief complaint of  Chief Complaint  Patient presents with  . Respiratory Distress     HPI:   Patient has been on IV azithromycin, Solu-Medrol 40 mg every 12, Xopenex nebulizer. Currently she is doing better, no new complaints.   Medication:    Current Facility-Administered Medications:  .  acetaminophen (TYLENOL) tablet 650 mg, 650 mg, Oral, Q6H PRN, 650 mg at 12/03/17 2205 **OR** acetaminophen (TYLENOL) suppository 650 mg, 650 mg, Rectal, Q6H PRN, Amelia Jo, MD .  ALPRAZolam Duanne Moron) tablet 0.25 mg, 0.25 mg, Oral, QHS PRN, Amelia Jo, MD .  alum & mag hydroxide-simeth (MAALOX/MYLANTA) 200-200-20 MG/5ML suspension 30 mL, 30 mL, Oral, Q6H PRN, Lafayette Dragon, MD, 30 mL at 12/03/17 1727 .  azithromycin (ZITHROMAX) 500 mg in sodium chloride 0.9 % 250 mL IVPB, 500 mg, Intravenous, Q24H, Lafayette Dragon, MD .  bisacodyl (DULCOLAX) EC tablet 5 mg, 5 mg, Oral, Daily PRN, Amelia Jo, MD .  carvedilol (COREG) tablet 12.5 mg, 12.5 mg, Oral, BID WC, Mody, Sital, MD .  clopidogrel (PLAVIX) tablet 75 mg, 75 mg, Oral, Daily, Amelia Jo, MD, 75 mg at 12/03/17 1035 .  dextrose 50 % solution 25 mL, 25 mL, Intravenous, PRN, Mody, Sital, MD .  dextrose 50 % solution 50 mL, 50 mL, Intravenous, Once, Mody, Sital, MD .  docusate sodium (COLACE) capsule 100 mg, 100 mg, Oral, BID, Amelia Jo, MD .  fluticasone furoate-vilanterol (BREO ELLIPTA) 200-25 MCG/INH 1 puff, 1 puff, Inhalation, Daily, Amelia Jo, MD, 1 puff at 12/03/17 1036 .  guaiFENesin-dextromethorphan (ROBITUSSIN DM) 100-10 MG/5ML syrup 5 mL, 5 mL, Oral, Q4H PRN, Lafayette Dragon, MD, 5 mL at 12/04/17 0321 .  heparin injection 5,000 Units, 5,000 Units, Subcutaneous, Q8H, Amelia Jo, MD, 5,000 Units at 12/04/17 [phone]  HYDROcodone-acetaminophen (NORCO/VICODIN) 5-325 MG per tablet 1-2 tablet, 1-2 tablet, Oral, Q4H PRN, Amelia Jo, MD .  insulin regular (NOVOLIN R,HUMULIN R) 100 units/mL injection 10 Units, 10 Units, Intravenous, Once, Mody, Sital, MD .  levalbuterol (XOPENEX) nebulizer solution 1.25 mg, 1.25 mg, Nebulization, Q6H, Mody, Sital, MD, 1.25 mg at 12/04/17 0810 .  magnesium sulfate IVPB 2 g 50 mL, 2 g, Intravenous, Once, Merlyn Lot, MD .  methylPREDNISolone sodium succinate (SOLU-MEDROL) 40 mg/mL injection 40 mg, 40 mg, Intravenous, Q12H, Lafayette Dragon, MD, 40 mg at 12/03/17 2207 .  montelukast (SINGULAIR) tablet 10 mg, 10 mg, Oral, QHS, Amelia Jo, MD, 10 mg at 12/03/17 2206 .  ondansetron (ZOFRAN) tablet 4 mg, 4 mg, Oral, Q6H PRN **OR** ondansetron (ZOFRAN) injection 4 mg, 4 mg, Intravenous, Q6H PRN, Amelia Jo, MD, 4 mg at 12/03/17 1816 .  promethazine (PHENERGAN) injection 6.25 mg, 6.25 mg, Intravenous, Q6H PRN, Awilda Bill, NP, 6.25 mg at 12/04/17 0448 .  rosuvastatin (CRESTOR) tablet 20 mg, 20 mg, Oral, q1800, Amelia Jo, MD, 20 mg at 12/03/17 1708 .  sodium polystyrene (KAYEXALATE) 15 GM/60ML suspension 30 g, 30 g, Oral, Once, Mody, Sital, MD .  traZODone (DESYREL) tablet  25 mg, 25 mg, Oral, QHS PRN, Amelia Jo, MD   Allergies:  Patient has no known allergies.  Review of Systems:  Constitutional: Feels well. Cardiovascular: No chest pain.  Pulmonary: Denies dyspnea.   The remainder of systems were reviewed and were found to be negative other than what is documented in the HPI.    Physical Examination:   VS: BP (!) 150/54   Pulse 83   Temp 98.1 F (36.7 C) (Oral)    Resp 20   Ht 5\' 1"  (1.549 m)   Wt 169 lb 12.8 oz (77 kg)   SpO2 96%   BMI 32.08 kg/m   General Appearance: No distress  Neuro:without focal findings, mental status, speech normal, alert and oriented HEENT: PERRLA, EOM intact Pulmonary: No wheezing, No rales  CardiovascularNormal S1,S2.  No m/r/g.  Abdomen: Benign, Soft, non-tender, No masses Renal:  No costovertebral tenderness  GU:  No performed at this time. Endoc: No evident thyromegaly, no signs of acromegaly or Cushing features Skin:   warm, no rashes, no ecchymosis  Extremities: normal, no cyanosis, clubbing.    LABORATORY PANEL:   CBC Recent Labs  Lab 12/03/17 0552  WBC 8.0  HGB 12.2  HCT 36.2  PLT 249   ------------------------------------------------------------------------------------------------------------------  Chemistries  Recent Labs  Lab 12/03/17 0005  12/04/17 0522  NA 131*   < > 130*  K 7.1*   < > 5.3*  CL 101   < > 95*  CO2 18*   < > 23  GLUCOSE 318*   < > 249*  BUN 17   < > 21  CREATININE 0.95   < > 1.11*  CALCIUM 8.4*   < > 8.5*  MG 1.9  --   --   AST 19  --   --   ALT 16  --   --   ALKPHOS 55  --   --   BILITOT 0.5  --   --    < > = values in this interval not displayed.   ------------------------------------------------------------------------------------------------------------------  Cardiac Enzymes Recent Labs  Lab 12/02/17 2000  TROPONINI <0.03   ------------------------------------------------------------  RADIOLOGY:   No results found for this or any previous visit. Results for orders placed during the hospital encounter of 11/05/17  DG Chest 2 View   Narrative CLINICAL DATA:  Worsening shortness of breath since last night. History of COPD with current symptoms not responsive to medication. Minimally productive cough.  EXAM: CHEST - 2 VIEW  COMPARISON:  Portable chest x-ray of September 25, 2017  FINDINGS: The lungs are adequately inflated. The interstitial  markings are coarse. There is no alveolar infiltrate. There is no pleural effusion. The heart is top-normal in size. The pulmonary vascularity is prominent centrally on the right but stable. There is calcification in the wall of the aortic arch. The bony thorax is unremarkable.  IMPRESSION: Chronic bronchitic changes with possible superimposed acute bronchitis. No alveolar pneumonia nor CHF.  Thoracic aortic atherosclerosis.   Electronically Signed   By: David  Martinique M.D.   On: 11/05/2017 17:01    ------------------------------------------------------------------------------------------------------------------  Thank  you for allowing [facility] to assist in the care of your patient. Our recommendations are noted above.  Please contact us if we can be of further service.   Marda Stalker, M.D., F.C.C.P.  Board Certified in Internal Medicine, Pulmonary Medicine, [place], and Sleep Medicine.  [facility] Office Number: [phone]  12/05/2017

## 2017-12-04 NOTE — Progress Notes (Signed)
[facility] NAME: Whitney Fleming    MR#:  [id number]  DATE OF BIRTH:  13-Mar-1946  SUBJECTIVE:   Patient had nausea this am denies abdmonal pain SOB better wants to eat Feels weak Last BM yesterday  REVIEW OF SYSTEMS:    Review of Systems  Constitutional: Negative for fever, chills weight loss HENT: Negative for ear pain, nosebleeds, congestion, facial swelling, rhinorrhea, neck pain, neck stiffness and ear discharge.   Respiratory: Negative for cough, shortness of breath, wheezing  Cardiovascular: Negative for chest pain, palpitations and leg swelling.  Gastrointestinal: Negative for heartburn, abdominal pain, vomiting, diarrhea or consitpation Genitourinary: Negative for dysuria, urgency, frequency, hematuria Musculoskeletal: Negative for back pain or joint pain Neurological: Negative for dizziness, seizures, syncope, focal weakness,  numbness and headaches.  Hematological: Does not bruise/bleed easily.  Psychiatric/Behavioral: Negative for hallucinations, confusion, dysphoric mood    Tolerating Diet: yes      DRUG ALLERGIES:  No Known Allergies  VITALS:  Blood pressure (!) 155/77, pulse (!) 108, temperature (!) 97.3 F (36.3 C), temperature source Oral, resp. rate 18, height 5\' 1"  (1.549 m), weight 77.3 kg (170 lb 6.7 oz), SpO2 95 %.  PHYSICAL EXAMINATION:  Constitutional: Appears well-developed and well-nourished. No distress. HENT: Normocephalic. Marland Kitchen Oropharynx is clear and moist.  Eyes: Conjunctivae and EOM are normal. PERRLA, no scleral icterus.  Neck: Normal ROM. Neck supple. No JVD. No tracheal deviation. CVS: RRR, S1/S2 +, no murmurs, no gallops, no carotid bruit.  Pulmonary: Effort and breath sounds normal, no stridor, rhonchi, wheezes, rales.  Abdominal: Soft. BS +,  no distension, tenderness, rebound or guarding.  Musculoskeletal: Normal range of motion. No edema and no tenderness.  Neuro: Alert. CN 2-12  grossly intact. No focal deficits. Skin: Skin is warm and dry. No rash noted. Psychiatric: Normal mood and affect.      LABORATORY PANEL:   CBC Recent Labs  Lab 12/03/17 0552  WBC 8.0  HGB 12.2  HCT 36.2  PLT 249   ------------------------------------------------------------------------------------------------------------------  Chemistries  Recent Labs  Lab 12/03/17 0005  12/04/17 0522  NA 131*   < > 130*  K 7.1*   < > 5.3*  CL 101   < > 95*  CO2 18*   < > 23  GLUCOSE 318*   < > 249*  BUN 17   < > 21  CREATININE 0.95   < > 1.11*  CALCIUM 8.4*   < > 8.5*  MG 1.9  --   --   AST 19  --   --   ALT 16  --   --   ALKPHOS 55  --   --   BILITOT 0.5  --   --    < > = values in this interval not displayed.   ------------------------------------------------------------------------------------------------------------------  Cardiac Enzymes Recent Labs  Lab 12/02/17 2000  TROPONINI <0.03   ------------------------------------------------------------------------------------------------------------------  RADIOLOGY:  Dg Chest Portable 1 View  Result Date: 12/02/2017 CLINICAL DATA:  Respiratory distress EXAM: PORTABLE CHEST 1 VIEW COMPARISON:  11/05/2017 FINDINGS: Normal heart size. Low lung volumes. Bibasilar atelectasis. No pneumothorax or pleural effusion. IMPRESSION: Bibasilar atelectasis Electronically Signed   By: [name] M.D.   On: 12/02/2017 20:16     ASSESSMENT AND PLAN:   72 year old female with a history of diabetes, essential hypertension and asthma who presents with shortness of breath.   1.  Acute hypoxic/hypercarbic respiratory failure in the setting of asthma exacerbation:  Patient  is off BiPAP and on room air  2.  Asthma exacerbation: Continue azithromycin, bronchodilators and wean IV steroids with plan to change to oral in am Change to [facility] given elevated heart rate   3  Diabetes: Start levemir. Cont SII with ADA diet  4.  Essential  hypertension: Continue Coreg (increased dose due to sinus tachycardia)  5.  Hyponatremia:This is chronic and stable  6.  Hyperkalemia: Treated this am with insulin, dextrose and kayexalate Recheck in am ARB on HOLD   7.  Nausea: Check KUB   Await PT consult for d/c planning Management plans discussed with the patient and family and they are in agreement.  CODE STATUS: Full  TOTAL TIME TAKING CARE OF THIS PATIENT: 24 minutes.   D/w family   POSSIBLE D/C tomorrow, DEPENDING ON CLINICAL CONDITION.   Rayce Brahmbhatt M.D on 12/04/2017 at 11:54 AM  Between 7am to 6pm - Pager - [phone] After 6pm go to [email or web address] - password [email or web address] [facility]  Office  [phone]  CC: Primary care physician; [name], MD  Note: This dictation was prepared with Dragon dictation along with smaller phrase technology. Any transcriptional errors that result from this process are unintentional.

## 2017-12-04 NOTE — Progress Notes (Addendum)
PT Cancellation Note  Patient Details Name: Whitney Fleming MRN: [id number] DOB: [birth year]   Cancelled Treatment:    Reason Eval/Treat Not Completed: Other (comment). Consult received and chart reviewed. Pt with elevated K+ at 5.3 and elevated HR at this time. Not appropriate for PT intervention this AM. May re-attempt in PM. Update- Pt continues to have elevated HR at 109bpm. Not on telemetry at this time. Will re-attempt next date.   Kirandeep Fariss 12/04/2017, 10:09 AM  Greggory Stallion, PT, DPT [phone]

## 2017-12-05 LAB — GLUCOSE, CAPILLARY
GLUCOSE-CAPILLARY: 383 mg/dL — AB (ref 70–99)
Glucose-Capillary: 198 mg/dL — ABNORMAL HIGH (ref 70–99)

## 2017-12-05 LAB — BASIC METABOLIC PANEL
Anion gap: 11 (ref 5–15)
BUN: 24 mg/dL — AB (ref 8–23)
CALCIUM: 8.3 mg/dL — AB (ref 8.9–10.3)
CO2: 23 mmol/L (ref 22–32)
CREATININE: 0.8 mg/dL (ref 0.44–1.00)
Chloride: 100 mmol/L (ref 98–111)
GFR calc Af Amer: >60 mL/min (ref >60)
GLUCOSE: 179 mg/dL — AB (ref 70–99)
Potassium: 4.1 mmol/L (ref 3.5–5.1)
Sodium: 134 mmol/L — ABNORMAL LOW (ref 135–145)

## 2017-12-05 MED ORDER — FLUTICASONE FUROATE-VILANTEROL 200-25 MCG/INH IN AEPB: 1.0000 | INHALATION_SPRAY | Freq: Every day | RESPIRATORY_TRACT | 0 refills | Status: DC

## 2017-12-05 MED ORDER — LEVALBUTEROL HCL 1.25 MG/0.5ML IN NEBU: 1.2500 mg | INHALATION_SOLUTION | Freq: Four times a day (QID) | RESPIRATORY_TRACT | 0 refills | Status: DC | PRN

## 2017-12-05 MED ORDER — PREDNISONE 10 MG PO TABS: 10.0000 mg | ORAL_TABLET | Freq: Every day | ORAL | 0 refills | Status: DC

## 2017-12-05 MED ORDER — AZITHROMYCIN 500 MG PO TABS: 500.0000 mg | ORAL_TABLET | Freq: Every day | ORAL | 0 refills | Status: AC

## 2017-12-05 NOTE — Discharge Summary (Signed)
[place] [facility] [place] NAME: Whitney Fleming    MR#:  [id number]  DATE OF BIRTH:  [birth year]  DATE OF ADMISSION:  12/02/2017 ADMITTING PHYSICIAN: Amelia Jo, MD  DATE OF DISCHARGE: 12/05/2017  PRIMARY CARE PHYSICIAN: [name], MD    ADMISSION DIAGNOSIS:  COPD exacerbation ([place]) [J44.1] Acute respiratory failure with hypoxia ([place]) [J96.01]  DISCHARGE DIAGNOSIS:  Active Problems:   Acute respiratory failure ([place])   SECONDARY DIAGNOSIS:   Past Medical History:  Diagnosis Date  . Asthma   . Cardiomegaly   . Diabetes mellitus without complication ([place])   . Hypertension   . Hyponatremia   . TB (tuberculosis)     HOSPITAL COURSE:   72 year old female with a history of diabetes, essential hypertension and asthma who presents with shortness of breath.   1.  Acute hypoxic/hypercarbic respiratory failure in the setting of asthma exacerbation:  Patient is weaned off of BiPAP and on room air  2.  Asthma exacerbation: She will continue azithromycin.  Due to elevated heart rates I changed her to Xopenex which she will have a prescription for.  She will continue with inhalers.  She will follow-up with Dr. Ashby Dawes in 1 week.  She is also referred to neuro rehab.    3  Diabetes: We will resume her outpatient medications from [place].  4.  Essential hypertension: Will continue her outpatient medications from [place].  5.  Hyponatremia: This has improved  6.  Hyperkalemia: Treated this am with insulin, dextrose and kayexalate   Outpatient palliative care and pulmonary rehab consult requested. DISCHARGE CONDITIONS AND DIET:   Stable for discharge on heart healthy diabetic diet  CONSULTS OBTAINED:    DRUG ALLERGIES:  No Known Allergies  DISCHARGE MEDICATIONS:   Allergies as of 12/05/2017   No Known Allergies     Medication List    STOP taking these medications   albuterol (2.5 MG/3ML) 0.083% nebulizer  solution Commonly known as:  PROVENTIL   albuterol 108 (90 Base) MCG/ACT inhaler Commonly known as:  PROVENTIL HFA;VENTOLIN HFA   Fluticasone-Umeclidin-Vilant 100-62.5-25 MCG/INH Aepb Commonly known as:  TRELEGY ELLIPTA     TAKE these medications   azithromycin 500 MG tablet Commonly known as:  ZITHROMAX Take 1 tablet (500 mg total) by mouth daily for 2 days. Start taking on:  12/06/2017   fluticasone furoate-vilanterol 200-25 MCG/INH Aepb Commonly known as:  BREO ELLIPTA Inhale 1 puff into the lungs daily.   levalbuterol 1.25 MG/0.5ML nebulizer solution Commonly known as:  XOPENEX Take 1.25 mg by nebulization every 6 (six) hours as needed for wheezing or shortness of breath.   montelukast 10 MG tablet Commonly known as:  SINGULAIR Take 1 tablet (10 mg total) by mouth at bedtime.   predniSONE 10 MG tablet Commonly known as:  DELTASONE Take 1 tablet (10 mg total) by mouth daily with breakfast. 40 mg PO (ORAL)  x 2 days 30 mg PO  (ORAL)  x 2 days 20 mg PO  (ORAL) x 2 days 10 mg PO  (ORAL) x 2 days then stop   PRESCRIPTION MEDICATION **Non-US market drug** Tenebite (teneligliptin)  1 tablet by mouth every morning   PRESCRIPTION MEDICATION **Non-US market drug** Amaryl MV2 (metformin/glipizide/voglibose) 1 tablet by mouth twice daily before meals   PRESCRIPTION MEDICATION **Non-US market drug** Carca CR20 (carvedilol controlled release) Take 1 tablet by mouth every evening   PRESCRIPTION MEDICATION **Non-US market drug** Eritel CH40 (telmesartan/chlorthaladone) Take 1 tablet by mouth every  morning after breakfast   PRESCRIPTION MEDICATION **Non-US market drug** Zyrova C (rosuvastatin/clopidigrel) Take 1 capsule by mouth every evening   PRESCRIPTION MEDICATION **Non-US market drug** Mashyne (cholecalciferol) Take 1 tablet by mouth every day after lunch   PRESCRIPTION MEDICATION **Non-US market drug** Rabiprime DSR (rabeprazole/domperidone) Take 1 capsule  by mouth every morning as directed   PRESCRIPTION MEDICATION **Non-US market drug** Volken (multivitamin with green tea extract) Take 1 tablet every morning   PRESCRIPTION MEDICATION **Non-US market drug** Betavert 24 (betahistine)  Take 1 tablet by mouth every morning as directed   PRESCRIPTION MEDICATION **Non-US market drug** Bro Zedex SF (guaifenesin/terbutaline/bromhexine) liquid Take by mouth as directed   PRESCRIPTION MEDICATION **Non-US market drug** Alerfix M (levocetirizine/montelukast) Take 1 tablet by mouth every morning as directed   PRESCRIPTION MEDICATION **Non-US market drug** Alprax 0.5 (alprazolam) Take by mouth as directed   PRESCRIPTION MEDICATION **Non-US market drug** Triohale MDI (tiotropium/formoterol/ciclesonide)  Inhale as directed            Museum/gallery conservator  (From admission, onward)        Start     Ordered   12/03/17 0945  For home use only DME Walker rolling  Once    Question:  Patient needs a walker to treat with the following condition  Answer:  Difficulty walking   12/03/17 0945        Today   CHIEF COMPLAINT:   Patient doing well this morning.  No shortness of breath or wheezing   VITAL SIGNS:  Blood pressure (!) 150/54, pulse 83, temperature 98.1 F (36.7 C), temperature source Oral, resp. rate 20, height 5\' 1"  (1.549 m), weight 77 kg (169 lb 12.8 oz), SpO2 96 %.   REVIEW OF SYSTEMS:  Review of Systems  Constitutional: Negative.  Negative for chills, fever and malaise/fatigue.  HENT: Negative.  Negative for ear discharge, ear pain, hearing loss, nosebleeds and sore throat.   Eyes: Negative.  Negative for blurred vision and pain.  Respiratory: Negative.  Negative for cough, hemoptysis, shortness of breath and wheezing.   Cardiovascular: Negative.  Negative for chest pain, palpitations and leg swelling.  Gastrointestinal: Negative.  Negative for abdominal pain, blood in stool, diarrhea, nausea and  vomiting.  Genitourinary: Negative.  Negative for dysuria.  Musculoskeletal: Negative.  Negative for back pain.  Skin: Negative.   Neurological: Negative for dizziness, tremors, speech change, focal weakness, seizures and headaches.  Endo/Heme/Allergies: Negative.  Does not bruise/bleed easily.  Psychiatric/Behavioral: Negative.  Negative for depression, hallucinations and suicidal ideas.     PHYSICAL EXAMINATION:  GENERAL:  72 y.o.-year-old patient lying in the bed with no acute distress.  NECK:  Supple, no jugular venous distention. No thyroid enlargement, no tenderness.  LUNGS: Normal breath sounds bilaterally, no wheezing, rales,rhonchi  No use of accessory muscles of respiration.  CARDIOVASCULAR: S1, S2 normal. No murmurs, rubs, or gallops.  ABDOMEN: Soft, non-tender, non-distended. Bowel sounds present. No organomegaly or mass.  EXTREMITIES: No pedal edema, cyanosis, or clubbing.  PSYCHIATRIC: The patient is alert and oriented x 3.  SKIN: No obvious rash, lesion, or ulcer.   DATA REVIEW:   CBC Recent Labs  Lab 12/03/17 0552  WBC 8.0  HGB 12.2  HCT 36.2  PLT 249    Chemistries  Recent Labs  Lab 12/03/17 0005  12/05/17 0545  NA 131*   < > 134*  K 7.1*   < > 4.1  CL 101   < > 100  CO2 18*   < >  23  GLUCOSE 318*   < > 179*  BUN 17   < > 24*  CREATININE 0.95   < > 0.80  CALCIUM 8.4*   < > 8.3*  MG 1.9  --   --   AST 19  --   --   ALT 16  --   --   ALKPHOS 55  --   --   BILITOT 0.5  --   --    < > = values in this interval not displayed.    Cardiac Enzymes Recent Labs  Lab 12/02/17 2000  TROPONINI <0.03    Microbiology Results  @MICRORSLT48 @  RADIOLOGY:  Dg Abd 1 View  Result Date: 12/04/2017 CLINICAL DATA:  Nausea EXAM: ABDOMEN - 1 VIEW COMPARISON:  None. FINDINGS: Diffusely gas distended small bowel and gas distended cecum. No pathologically dilated small bowel is identified. IMPRESSION: Gas distended small bowel and cecum without evidence of  small-bowel obstruction. Electronically Signed   By: Ulyses Jarred M.D.   On: 12/04/2017 15:38      Allergies as of 12/05/2017   No Known Allergies     Medication List    STOP taking these medications   albuterol (2.5 MG/3ML) 0.083% nebulizer solution Commonly known as:  PROVENTIL   albuterol 108 (90 Base) MCG/ACT inhaler Commonly known as:  PROVENTIL HFA;VENTOLIN HFA   Fluticasone-Umeclidin-Vilant 100-62.5-25 MCG/INH Aepb Commonly known as:  TRELEGY ELLIPTA     TAKE these medications   azithromycin 500 MG tablet Commonly known as:  ZITHROMAX Take 1 tablet (500 mg total) by mouth daily for 2 days. Start taking on:  12/06/2017   fluticasone furoate-vilanterol 200-25 MCG/INH Aepb Commonly known as:  BREO ELLIPTA Inhale 1 puff into the lungs daily.   levalbuterol 1.25 MG/0.5ML nebulizer solution Commonly known as:  XOPENEX Take 1.25 mg by nebulization every 6 (six) hours as needed for wheezing or shortness of breath.   montelukast 10 MG tablet Commonly known as:  SINGULAIR Take 1 tablet (10 mg total) by mouth at bedtime.   predniSONE 10 MG tablet Commonly known as:  DELTASONE Take 1 tablet (10 mg total) by mouth daily with breakfast. 40 mg PO (ORAL)  x 2 days 30 mg PO  (ORAL)  x 2 days 20 mg PO  (ORAL) x 2 days 10 mg PO  (ORAL) x 2 days then stop   PRESCRIPTION MEDICATION **Non-US market drug** Tenebite (teneligliptin)  1 tablet by mouth every morning   PRESCRIPTION MEDICATION **Non-US market drug** Amaryl MV2 (metformin/glipizide/voglibose) 1 tablet by mouth twice daily before meals   PRESCRIPTION MEDICATION **Non-US market drug** Carca CR20 (carvedilol controlled release) Take 1 tablet by mouth every evening   PRESCRIPTION MEDICATION **Non-US market drug** Eritel CH40 (telmesartan/chlorthaladone) Take 1 tablet by mouth every morning after breakfast   PRESCRIPTION MEDICATION **Non-US market drug** Zyrova C (rosuvastatin/clopidigrel) Take 1 capsule by  mouth every evening   PRESCRIPTION MEDICATION **Non-US market drug** Mashyne (cholecalciferol) Take 1 tablet by mouth every day after lunch   PRESCRIPTION MEDICATION **Non-US market drug** Rabiprime DSR (rabeprazole/domperidone) Take 1 capsule by mouth every morning as directed   PRESCRIPTION MEDICATION **Non-US market drug** Volken (multivitamin with green tea extract) Take 1 tablet every morning   PRESCRIPTION MEDICATION **Non-US market drug** Betavert 24 (betahistine)  Take 1 tablet by mouth every morning as directed   PRESCRIPTION MEDICATION **Non-US market drug** Bro Zedex SF (guaifenesin/terbutaline/bromhexine) liquid Take by mouth as directed   PRESCRIPTION MEDICATION **Non-US market drug** Alerfix M (levocetirizine/montelukast) Take 1  tablet by mouth every morning as directed   PRESCRIPTION MEDICATION **Non-US market drug** Alprax 0.5 (alprazolam) Take by mouth as directed   PRESCRIPTION MEDICATION **Non-US market drug** Triohale MDI (tiotropium/formoterol/ciclesonide)  Inhale as directed            Museum/gallery conservator  (From admission, onward)        Start     Ordered   12/03/17 0945  For home use only DME Walker rolling  Once    Question:  Patient needs a walker to treat with the following condition  Answer:  Difficulty walking   12/03/17 0945          Management plans discussed with the patient and daughter and she is in agreement. Stable for discharge   Patient should follow up with pcp  CODE STATUS:     Code Status Orders  (From admission, onward)        Start     Ordered   12/02/17 2313  Full code  Continuous     12/02/17 2312    Code Status History    Date Active Date Inactive Code Status Order ID Comments User Context   09/26/2017 0131 09/29/2017 1501 Full Code [id number]  Lance Coon, MD Inpatient      TOTAL TIME TAKING CARE OF THIS PATIENT: 38 minutes.    Note: This dictation was prepared with Dragon  dictation along with smaller phrase technology. Any transcriptional errors that result from this process are unintentional.  Brando Taves M.D on 12/05/2017 at 11:16 AM  Between 7am to 6pm - Pager - [phone] After 6pm go to [email or web address] - password [email or web address] [facility]  Office  [phone]  CC: Primary care physician; [name], MD

## 2017-12-05 NOTE — Progress Notes (Addendum)
Pt a&ox4. VS stable. IVs removed. Discharge instructions given to daughter and patient with explanations and visual aids provided. No questions unanswered. Pt and daughter stated understanding. Pt given 4 prescriptions and new medication information. Understanding stated. Daughter to transport home.  Kyla Balzarine, LPN

## 2017-12-05 NOTE — Progress Notes (Signed)
Inpatient Diabetes Program Recommendations  AACE/ADA: New Consensus Statement on Inpatient Glycemic Control (2019)  Target Ranges:  Prepandial:   less than 140 mg/dL      Peak postprandial:   less than 180 mg/dL (1-2 hours)      Critically ill patients:  140 - 180 mg/dL   Results for JACKELYNN, HOSIE (MRN [id number]) as of 12/05/2017 09:24  Ref. Range 12/04/2017 12:27 12/04/2017 17:48 12/04/2017 17:52 12/04/2017 21:01 12/05/2017 07:37  Glucose-Capillary Latest Ref Range: 70 - 99 mg/dL 368 (H)  Novolog 20 units  Levemir 15 units 413 (H)   424 (H)  Novolog 24 units 359 (H) 198 (H)  Novolog 8 units  Results for LUJEAN, EBRIGHT (MRN [id number]) as of 12/04/2017 10:14  Ref. Range 12/03/2017 23:33 12/04/2017 00:50 12/04/2017 01:51 12/04/2017 02:27 12/04/2017 03:33 12/04/2017 08:31  Glucose-Capillary Latest Ref Range: 70 - 99 mg/dL 174 (H) 192 (H)  Levemir 10 units @ 1:01am 166 (H) 150 (H) 195 (H) 323 (H)  Regular 10 units IV @ 9:24  Results for LILIAH, DORIAN (MRN [id number]) as of 12/04/2017 10:14  Ref. Range 09/27/2017 04:32  Hemoglobin A1C Latest Ref Range: 4.8 - 5.6 % 8.1 (H)   Review of Glycemic Control  Diabetes history: DM2 Outpatient Diabetes medications:  **Non-US market drug**                             Amaryl MV2 (metformin/glipizide/voglibose)                             1 tablet by mouth twice daily before meals                             **Non-US market drug**                            Tenebite (teneligliptin)                             1 tablet by mouth every morning  Current orders for Inpatient glycemic control: Levemir 15 units daily, Novolog 0-20 units TID with meals, Novolog 4 units TID with meals; Solumedrol 40 mg daily  Inpatient Diabetes Program Recommendations:  Correction (SSI): Please consider ordering Novolog 0-5 units QHS for bedtime correction scale. Insulin - Meal Coverage: If steroids are continued, please consider increasing meal coverage to Novolog 6 units TID with meals  if patient eats at least 50% of meals.  NOTE: Noted Solumedrol decreased from 40 mg BID to daily on 12/04/17. Bedtime glucose 359 mg/dl last night but no Novolog given since no bedtime correction is ordered.   Thanks, Barnie Alderman, RN, MSN, CDE Diabetes Coordinator Inpatient Diabetes Program [phone] (Team Pager from 8am to 5pm)

## 2017-12-05 NOTE — Evaluation (Addendum)
Physical Therapy Evaluation Patient Details Name: Whitney Fleming MRN: [id number] DOB: [birth year] Today's Date: 12/05/2017   History of Present Illness  Pt admitted for ARF. HIstory includes asthma, DM, HTN, and TB. Pt speaks [place]. No interpreter available at this time. Stay complicated by transfer to CCU secondary to need for bipap. Pt with complaints with SOB symptoms.   Clinical Impression  Pt is a pleasant 72 year old female who was admitted for ARF. Pt performs bed mobility with mod I and transfers/ambulation with cga and RW. Attempted language line, however language unavailable. Due to language barrier, unable to obtain clear history, therefore called daughter after session to obtain history. Per daughter pt usually ambulates independently. Pt demonstrates deficits with endurance/balance. Pt would benefit from use of RW at this time to decrease fall risk. Would benefit from skilled PT to address above deficits and promote optimal return to PLOF. Recommend transition to [facility] upon discharge from acute hospitalization.       Follow Up Recommendations Home health PT    Equipment Recommendations  Rolling walker with 5" wheels    Recommendations for Other Services       Precautions / Restrictions Precautions Precautions: Fall Restrictions Weight Bearing Restrictions: No      Mobility  Bed Mobility Overal bed mobility: Modified Independent             General bed mobility comments: safe technique with ease of transfer  Transfers Overall transfer level: Needs assistance Equipment used: Rolling walker (2 wheeled) Transfers: Sit to/from Stand Sit to Stand: Min guard         General transfer comment: pt initially tries to get up without assistance. Needs cues for safe technique. Once standing, able to use RW with upright posture. RW used safely  Ambulation/Gait Ambulation/Gait assistance: Min Conservator, museum/gallery (Feet): 125 Feet Assistive device: Rolling walker (2  wheeled) Gait Pattern/deviations: Step-through pattern     General Gait Details: ambulated in hallway with RW with cga. Followed with chair due to language barrier. No SOB symptoms noted with O2 sats at 96% post exertion. HR elevated to 115bpm post ambulation.  Stairs            Wheelchair Mobility    Modified Rankin (Stroke Patients Only)       Balance Overall balance assessment: Needs assistance Sitting-balance support: Feet supported;No upper extremity supported Sitting balance-Leahy Scale: Good     Standing balance support: Bilateral upper extremity supported Standing balance-Leahy Scale: Good                               Pertinent Vitals/Pain Pain Assessment: No/denies pain    Home Living Family/patient expects to be discharged to:: Private residence Living Arrangements: Children Available Help at Discharge: Family;Available 24 hours/day Type of Home: House       Home Layout: One level Home Equipment: None      Prior Function Level of Independence: Independent               Hand Dominance        Extremity/Trunk Assessment   Upper Extremity Assessment Upper Extremity Assessment: Overall WFL for tasks assessed    Lower Extremity Assessment Lower Extremity Assessment: Overall WFL for tasks assessed       Communication   Communication: No difficulties  Cognition Arousal/Alertness: Awake/alert Behavior During Therapy: Impulsive Overall Cognitive Status: No family/caregiver present to determine baseline cognitive functioning  General Comments      Exercises Other Exercises Other Exercises: Pt performed supine ther-ex on B LE including SLRs, ankle pumps, and heel slides. ALl ther-ex performed x 10 reps with supervision. Demonstration given prior to performance.    Assessment/Plan    PT Assessment Patient needs continued PT services  PT Problem List Decreased  balance;Decreased activity tolerance;Decreased mobility;Decreased safety awareness;Decreased knowledge of use of DME       PT Treatment Interventions Gait training;DME instruction;Therapeutic exercise;Balance training    PT Goals (Current goals can be found in the Care Plan section)  Acute Rehab PT Goals Patient Stated Goal: unable to state PT Goal Formulation: With family Time For Goal Achievement: 12/19/17 Potential to Achieve Goals: Good    Frequency Min 2X/week   Barriers to discharge        Co-evaluation               AM-PAC PT "6 Clicks" Daily Activity  Outcome Measure Difficulty turning over in bed (including adjusting bedclothes, sheets and blankets)?: None Difficulty moving from lying on back to sitting on the side of the bed? : None Difficulty sitting down on and standing up from a chair with arms (e.g., wheelchair, bedside commode, etc,.)?: Unable Help needed moving to and from a bed to chair (including a wheelchair)?: A Little Help needed walking in hospital room?: A Little Help needed climbing 3-5 steps with a railing? : A Little 6 Click Score: 18    End of Session Equipment Utilized During Treatment: Gait belt Activity Tolerance: Patient tolerated treatment well Patient left: in chair;with chair alarm set Nurse Communication: Mobility status PT Visit Diagnosis: Unsteadiness on feet (R26.81);Difficulty in walking, not elsewhere classified (R26.2)    Time: 9604-5409 PT Time Calculation (min) (ACUTE ONLY): 23 min   Charges:   PT Evaluation $PT Eval Low Complexity: 1 Low PT Treatments $Therapeutic Exercise: 8-22 mins   PT G Codes:        Greggory Stallion, PT, DPT [phone]   Gwendolyn Nishi 12/05/2017, 2:28 PM

## 2017-12-05 NOTE — Care Management (Signed)
Discharge to home today per Dr. Benjie Karvonen. Physical therapy is recommending services in the home. Spoke with daughter, Sherron Flemings [phone]). [facility] for [place] services and rolling walker Shelbie Ammons RN MSN CCM Care Management [phone]

## 2017-12-06 ENCOUNTER — Telehealth: Payer: Self-pay

## 2017-12-06 ENCOUNTER — Other Ambulatory Visit: Payer: Self-pay | Admitting: *Deleted

## 2017-12-06 ENCOUNTER — Telehealth: Payer: Self-pay | Admitting: Internal Medicine

## 2017-12-06 MED ORDER — LEVALBUTEROL HCL 1.25 MG/0.5ML IN NEBU: 1.2500 mg | INHALATION_SOLUTION | Freq: Four times a day (QID) | RESPIRATORY_TRACT | 5 refills | Status: DC | PRN

## 2017-12-06 MED ORDER — MONTELUKAST SODIUM 10 MG PO TABS: 10.0000 mg | ORAL_TABLET | Freq: Every day | ORAL | 2 refills | Status: DC

## 2017-12-06 NOTE — Telephone Encounter (Signed)
Patient daughter calling to schedule hospital follow up States patient does not have any medication yet from the hospital  Would like to speak with nurse for medication clarification  Please call to discuss

## 2017-12-06 NOTE — Telephone Encounter (Signed)
Transition Care Management Follow-up Telephone Call  How have you been since you were released from the hospital? Pt states she is doing ok, was not able to get Xopenex and Breo elipta inhaler due to insurance. Pt has had some wheezing, but not as bad as previously. Declines SOB, fever, pain and n/v/d.  Do you understand why you were in the hospital? yes  Do you have a copy of your discharge instructions Yes Do you understand the discharge instrcutions? yes  Where were you discharged to? Home  Do you have support at home? Yes    Items Reviewed:  Medications obtained Yes  Medications reviewed: Yes  Dietary changes reviewed: yes, diabetic diet  Home Health? N/A  DME ordered at discharge obtained? NA  Medical supplies: NA    Functional Questionnaire:   Activities of Daily Living (ADLs):   She states they are independent in the following: ambulation, bathing and hygiene, feeding, continence, grooming, toileting, dressing and medication management States they require assistance with the following: N/A  Any transportation issues/concerns?: no, daughter drives  Any patient concerns? Yes, unable to get Xopenex and Bre Elipta inhaler due to not being covered by insurance.  Confirmed importance and date/time of follow-up visits scheduled with PCP: yes for 01/08/18  Confirm appointment scheduled with specialist? Yes  Confirmed with patient if condition begins to worsen call PCP or If it's emergency go to the ER.

## 2017-12-06 NOTE — Telephone Encounter (Signed)
Left message to call back  

## 2017-12-06 NOTE — Telephone Encounter (Signed)
  Memory Dance will have to be changed to covered alternative Dulera or Symbicort. Breo PA sent to clinical pharmacist 24 hrs before decision [place] Medicaid Call ID [id number]  Levalbuterol 1.25 mg approved for 1 year [id number]  Singulair 10 mg Approved

## 2017-12-06 NOTE — Telephone Encounter (Signed)
Pt daughter returning our call

## 2017-12-07 LAB — CULTURE, BLOOD (ROUTINE X 2)
Culture: NO GROWTH
Culture: NO GROWTH

## 2017-12-09 MED ORDER — BUDESONIDE-FORMOTEROL FUMARATE 160-4.5 MCG/ACT IN AERO: 2.0000 | INHALATION_SPRAY | Freq: Two times a day (BID) | RESPIRATORY_TRACT | 6 refills | Status: DC

## 2017-12-09 NOTE — Telephone Encounter (Signed)
May change to symbicort 160, 2 puffs twice daily, rinse mouth after use.

## 2017-12-09 NOTE — Telephone Encounter (Signed)
Pt daughter returning our call  °Please call back °  °

## 2017-12-09 NOTE — Telephone Encounter (Signed)
Left message for daughter that inhaler has been changed and sent to pharmacy.

## 2017-12-09 NOTE — Telephone Encounter (Signed)
rx sent to [facility]

## 2017-12-09 NOTE — Telephone Encounter (Signed)
Patients daughter aware

## 2017-12-13 ENCOUNTER — Inpatient Hospital Stay: Payer: Medicaid Other | Admitting: Internal Medicine

## 2017-12-14 DIAGNOSIS — E119 Type 2 diabetes mellitus without complications: Secondary | ICD-10-CM | POA: Diagnosis not present

## 2017-12-14 DIAGNOSIS — J9601 Acute respiratory failure with hypoxia: Secondary | ICD-10-CM | POA: Diagnosis not present

## 2017-12-14 DIAGNOSIS — Z7984 Long term (current) use of oral hypoglycemic drugs: Secondary | ICD-10-CM | POA: Diagnosis not present

## 2017-12-14 DIAGNOSIS — Z8611 Personal history of tuberculosis: Secondary | ICD-10-CM | POA: Diagnosis not present

## 2017-12-14 DIAGNOSIS — E871 Hypo-osmolality and hyponatremia: Secondary | ICD-10-CM | POA: Diagnosis not present

## 2017-12-14 DIAGNOSIS — I10 Essential (primary) hypertension: Secondary | ICD-10-CM | POA: Diagnosis not present

## 2017-12-14 DIAGNOSIS — J9602 Acute respiratory failure with hypercapnia: Secondary | ICD-10-CM | POA: Diagnosis not present

## 2017-12-14 DIAGNOSIS — J441 Chronic obstructive pulmonary disease with (acute) exacerbation: Secondary | ICD-10-CM | POA: Diagnosis not present

## 2017-12-14 DIAGNOSIS — J45901 Unspecified asthma with (acute) exacerbation: Secondary | ICD-10-CM | POA: Diagnosis not present

## 2017-12-16 ENCOUNTER — Telehealth: Payer: Self-pay | Admitting: Family Medicine

## 2017-12-16 NOTE — Telephone Encounter (Signed)
Pulm could help with Xopenex and Breo, yes.  I have not seen this patient yet, so I cannot order these for her.  Thanks!  [name], MD, MPH [facility] 12/16/2017 9:02 AM

## 2017-12-16 NOTE — Telephone Encounter (Signed)
Ok to giver verbal orders?

## 2017-12-16 NOTE — Telephone Encounter (Signed)
OK to give verbal orders. This patient has not been seen yet, so maybe we need to move up new patient/hospital f/u appt.  [name], MD, MPH [facility] 12/16/2017 4:52 PM

## 2017-12-16 NOTE — Telephone Encounter (Signed)
Butch Penny with [facility] is requesting verbal orders for in home nursing services for COPD teaching & medication instructions as follows:  Once a week for 1 week (1W1) Twice a week for 2 weeks (2W2)  Please advise. Thanks TNP

## 2017-12-17 ENCOUNTER — Telehealth: Payer: Self-pay

## 2017-12-17 ENCOUNTER — Telehealth: Payer: Self-pay | Admitting: Internal Medicine

## 2017-12-17 ENCOUNTER — Inpatient Hospital Stay: Payer: Medicaid Other | Admitting: Internal Medicine

## 2017-12-17 DIAGNOSIS — J9601 Acute respiratory failure with hypoxia: Secondary | ICD-10-CM | POA: Diagnosis not present

## 2017-12-17 DIAGNOSIS — E871 Hypo-osmolality and hyponatremia: Secondary | ICD-10-CM | POA: Diagnosis not present

## 2017-12-17 DIAGNOSIS — Z8611 Personal history of tuberculosis: Secondary | ICD-10-CM | POA: Diagnosis not present

## 2017-12-17 DIAGNOSIS — J45901 Unspecified asthma with (acute) exacerbation: Secondary | ICD-10-CM | POA: Diagnosis not present

## 2017-12-17 DIAGNOSIS — Z7984 Long term (current) use of oral hypoglycemic drugs: Secondary | ICD-10-CM | POA: Diagnosis not present

## 2017-12-17 DIAGNOSIS — J9602 Acute respiratory failure with hypercapnia: Secondary | ICD-10-CM | POA: Diagnosis not present

## 2017-12-17 DIAGNOSIS — E119 Type 2 diabetes mellitus without complications: Secondary | ICD-10-CM | POA: Diagnosis not present

## 2017-12-17 DIAGNOSIS — J441 Chronic obstructive pulmonary disease with (acute) exacerbation: Secondary | ICD-10-CM | POA: Diagnosis not present

## 2017-12-17 DIAGNOSIS — I10 Essential (primary) hypertension: Secondary | ICD-10-CM | POA: Diagnosis not present

## 2017-12-17 NOTE — Telephone Encounter (Signed)
Verbal orders given and appointment moved to 12/19/2017.

## 2017-12-17 NOTE — Telephone Encounter (Signed)
VM left to offer to schedule visit with Palliative care

## 2017-12-17 NOTE — Telephone Encounter (Signed)
Verbal given 

## 2017-12-17 NOTE — Telephone Encounter (Signed)
Received return call from patient's daughter to schedule visit with Palliative Care. Visit scheduled for 12/24/17

## 2017-12-17 NOTE — Telephone Encounter (Signed)
Elder Cyphers with [facility] calling to get a verbal on PT for patient She states patient does have PCP but hasn't seen them yet. Would like to see if we can do it  She would like to see patient Twice a week for 2 weeks   Please call back with advise

## 2017-12-17 NOTE — Telephone Encounter (Signed)
ok 

## 2017-12-18 DIAGNOSIS — J9602 Acute respiratory failure with hypercapnia: Secondary | ICD-10-CM | POA: Diagnosis not present

## 2017-12-18 DIAGNOSIS — J9601 Acute respiratory failure with hypoxia: Secondary | ICD-10-CM | POA: Diagnosis not present

## 2017-12-18 DIAGNOSIS — Z8611 Personal history of tuberculosis: Secondary | ICD-10-CM | POA: Diagnosis not present

## 2017-12-18 DIAGNOSIS — E119 Type 2 diabetes mellitus without complications: Secondary | ICD-10-CM | POA: Diagnosis not present

## 2017-12-18 DIAGNOSIS — I10 Essential (primary) hypertension: Secondary | ICD-10-CM | POA: Diagnosis not present

## 2017-12-18 DIAGNOSIS — J45901 Unspecified asthma with (acute) exacerbation: Secondary | ICD-10-CM | POA: Diagnosis not present

## 2017-12-18 DIAGNOSIS — J441 Chronic obstructive pulmonary disease with (acute) exacerbation: Secondary | ICD-10-CM | POA: Diagnosis not present

## 2017-12-18 DIAGNOSIS — Z7984 Long term (current) use of oral hypoglycemic drugs: Secondary | ICD-10-CM | POA: Diagnosis not present

## 2017-12-18 DIAGNOSIS — E871 Hypo-osmolality and hyponatremia: Secondary | ICD-10-CM | POA: Diagnosis not present

## 2017-12-19 ENCOUNTER — Ambulatory Visit (INDEPENDENT_AMBULATORY_CARE_PROVIDER_SITE_OTHER): Payer: Medicaid Other | Admitting: Family Medicine

## 2017-12-19 ENCOUNTER — Encounter: Payer: Self-pay | Admitting: Family Medicine

## 2017-12-19 VITALS — BP 132/60 | HR 112 | Temp 97.6°F | Resp 24 | Ht <= 58 in | Wt 174.0 lb

## 2017-12-19 DIAGNOSIS — J4531 Mild persistent asthma with (acute) exacerbation: Secondary | ICD-10-CM | POA: Diagnosis not present

## 2017-12-19 DIAGNOSIS — J9601 Acute respiratory failure with hypoxia: Secondary | ICD-10-CM

## 2017-12-19 DIAGNOSIS — I10 Essential (primary) hypertension: Secondary | ICD-10-CM | POA: Diagnosis not present

## 2017-12-19 DIAGNOSIS — E1121 Type 2 diabetes mellitus with diabetic nephropathy: Secondary | ICD-10-CM

## 2017-12-19 DIAGNOSIS — K219 Gastro-esophageal reflux disease without esophagitis: Secondary | ICD-10-CM | POA: Diagnosis not present

## 2017-12-19 DIAGNOSIS — E785 Hyperlipidemia, unspecified: Secondary | ICD-10-CM | POA: Diagnosis not present

## 2017-12-19 DIAGNOSIS — E871 Hypo-osmolality and hyponatremia: Secondary | ICD-10-CM | POA: Diagnosis not present

## 2017-12-19 DIAGNOSIS — E876 Hypokalemia: Secondary | ICD-10-CM | POA: Diagnosis not present

## 2017-12-19 MED ORDER — EMPAGLIFLOZIN 25 MG PO TABS: 25.0000 mg | ORAL_TABLET | Freq: Every day | ORAL | 2 refills | Status: DC

## 2017-12-19 MED ORDER — METFORMIN HCL 1000 MG PO TABS: 1000.0000 mg | ORAL_TABLET | Freq: Two times a day (BID) | ORAL | 3 refills | Status: DC

## 2017-12-19 MED ORDER — LOSARTAN POTASSIUM-HCTZ 50-12.5 MG PO TABS: 1.0000 | ORAL_TABLET | Freq: Every day | ORAL | 3 refills | Status: DC

## 2017-12-19 MED ORDER — PREDNISONE 20 MG PO TABS: 40.0000 mg | ORAL_TABLET | Freq: Every day | ORAL | 0 refills | Status: DC

## 2017-12-19 MED ORDER — CARVEDILOL 12.5 MG PO TABS: 12.5000 mg | ORAL_TABLET | Freq: Two times a day (BID) | ORAL | 3 refills | Status: DC

## 2017-12-19 NOTE — Progress Notes (Signed)
Patient: Whitney Fleming, Female    DOB: 1945/11/02, 72 y.o.   MRN: [id number] Visit Date: 12/20/2017  Today's Provider: Lavon Paganini, MD   I, Martha Clan, CMA, am acting as scribe for Lavon Paganini, MD.  Chief Complaint  Patient presents with  . New Patient (Initial Visit)   Subjective:    Establish Care Whitney Fleming is a 72 y.o. female who presents today as a new pt to establish care. She feels fairly well. Pt started feeling worse since stopping prednisone. She reports exercising yes; walks around house. She reports she is sleeping well.  Patient moved here from [place] about 1 month ago and brought 48-month supply of all of her medications from [place].  Amongst her medications, she has tramadol which she states she takes for pain, but the pink pills, which are paracetamol or Tylenol seem to help her more.  She also has alprazolam 0.5 mg pills.  She states she takes half of 1 of these nightly as needed for sleep.  Her daughter did not know that she had a sleeping pill and does not want her to be taking this.  Hospitalized from 12/02/2017 to 12/05/2017.  She was hospitalized for acute respiratory failure with hypoxia and hypercarbia secondary to asthma exacerbation.  She was initially treated with BiPAP, IV steroids, azithromycin, and scheduled albuterol.  Her home Spiriva was continued.  She has a follow-up appointment with pulmonology next week.  She is finished her course of azithromycin.  She is finished her prednisone taper.  She was prescribed Xopenex on discharge due to tachycardia with albuterol and she has been unable to get this.  She has not had a rescue inhaler since discharge.  She is only been taking Spiriva as needed as she did not know this was meant to be taking consistently.  She is also noted to have hyponatremia and hyperkalemia during hospitalization that improved prior to discharge.  She has a home health nurse and physical therapist that are visiting her.  She  was also referred to neuro rehab on discharge.  She was weaned to room air prior to discharge.  She is having wheezing, cough, and shortness of breath.  She denies any fevers.  She is also taking her Singulair daily as prescribed.  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  HTN: - Medications: Carvedilol extended release 20 mg daily, telmisartan/chlorthalidone 40-12.5 mg daily - Compliance: Good - Checking BP at home: No - Denies any SOB, CP, vision changes, LE edema, medication SEs, or symptoms of hypotension  Hyperlipidemia: Patient is taking a combo pill of rosuvastatin and Plavix that she was prescribed in [place].  She denies any previous CHF, heart attack, CAD, stroke.  She is not sure why she is on Plavix.  Her daughter believes this may be prescribed frequently with statins in [place] for prevention.  T2DM - Checking BG at home: No - Medications: Tineligliptin, metformin, glipizide,voglibose - Compliance: She is prescribed her metformin glipizide combo twice daily, she is only taking once daily, otherwise good - eye exam: Unsure - foot exam: Unsure - microalbumin: N/A on ARB - denies symptoms of hypoglycemia, polyuria, polydipsia, numbness extremities, foot ulcers/trauma   GERD: Taking Rabeprazole/domperidone daily with good relief of symptoms.  They are not sure if she is ever had an EGD or colonoscopy.  They deny any history of PUD. -----------------------------------------------------------------   Review of Systems  Constitutional: Negative.   HENT: Negative.   Eyes: Negative.  Respiratory: Positive for cough, shortness of breath and wheezing. Negative for apnea, choking, chest tightness and stridor.   Cardiovascular: Positive for leg swelling. Negative for chest pain and palpitations.  Gastrointestinal: Negative.   Endocrine: Negative.   Genitourinary: Negative.   Musculoskeletal: Negative.   Skin: Negative.   Allergic/Immunologic: Negative.   Neurological: Negative.     Hematological: Negative.   Psychiatric/Behavioral: Negative.     Social History      She  reports that she has never smoked. She has never used smokeless tobacco. She reports that she does not drink alcohol or use drugs.       Social History   Socioeconomic History  . Marital status: Married    Spouse name: Not on file  . Number of children: 4  . Years of education: Not on file  . Highest education level: Not on file  Occupational History  . Occupation: homemaker  Social Needs  . Financial resource strain: Not on file  . Food insecurity:    Worry: Not on file    Inability: Not on file  . Transportation needs:    Medical: Not on file    Non-medical: Not on file  Tobacco Use  . Smoking status: Never Smoker  . Smokeless tobacco: Never Used  Substance and Sexual Activity  . Alcohol use: Never    Frequency: Never  . Drug use: Never  . Sexual activity: Not Currently  Lifestyle  . Physical activity:    Days per week: Not on file    Minutes per session: Not on file  . Stress: Not on file  Relationships  . Social connections:    Talks on phone: Not on file    Gets together: Not on file    Attends religious service: Not on file    Active member of club or organization: Not on file    Attends meetings of clubs or organizations: Not on file    Relationship status: Not on file  Other Topics Concern  . Not on file  Social History Narrative  . Not on file    Past Medical History:  Diagnosis Date  . Asthma   . Cardiomegaly   . Diabetes mellitus without complication ([place])   . Hypertension   . Hyponatremia   . TB (tuberculosis)      Patient Active Problem List   Diagnosis Date Noted  . Acute respiratory failure ([place]) 12/02/2017  . Asthma exacerbation 09/26/2017  . Acute respiratory failure with hypoxia ([place]) 09/26/2017  . HTN (hypertension) 09/26/2017  . Diabetes ([place]) 09/26/2017    Past Surgical History:  Procedure Laterality Date  . TUBAL LIGATION       Family History        Family Status  Relation Name Status  . Other  (Not Specified)  . Mother  (Not Specified)  . Father  (Not Specified)  . Neg Hx  (Not Specified)        Her family history includes Asthma in her father; Healthy in her mother; Heart disease in her father; Tuberculosis in her other. There is no history of Cancer.      No Known Allergies   Current Outpatient Medications:  .  budesonide-formoterol (SYMBICORT) 160-4.5 MCG/ACT inhaler, Inhale 2 puffs into the lungs 2 (two) times daily., Disp: 1 Inhaler, Rfl: 6 .  montelukast (SINGULAIR) 10 MG tablet, Take 1 tablet (10 mg total) by mouth at bedtime., Disp: 30 tablet, Rfl: 2 .  PRESCRIPTION MEDICATION, **Non-US market drug** Tenebite (teneligliptin)  1 tablet by mouth every morning, Disp: , Rfl:  .  PRESCRIPTION MEDICATION, **Non-US market drug** Amaryl MV2 (metformin/glipizide/voglibose) 1 tablet by mouth twice daily before meals, Disp: , Rfl:  .  PRESCRIPTION MEDICATION, **Non-US market drug** Carca CR20 (carvedilol controlled release) Take 1 tablet by mouth every evening, Disp: , Rfl:  .  PRESCRIPTION MEDICATION, **Non-US market drug** E[place]el CH40 (telmesartan/chlorthaladone) Take 1 tablet by mouth every morning after breakfast, Disp: , Rfl:  .  PRESCRIPTION MEDICATION, **Non-US market drug** Zyrova C (rosuvastatin/clopidigrel) Take 1 capsule by mouth every evening, Disp: , Rfl:  .  PRESCRIPTION MEDICATION, **Non-US market drug** Mashyne (cholecalciferol) Take 1 tablet by mouth every day after lunch, Disp: , Rfl:  .  PRESCRIPTION MEDICATION, **Non-US market drug** Rabiprime DSR (rabeprazole/domperidone) Take 1 capsule by mouth every morning as directed, Disp: , Rfl:  .  PRESCRIPTION MEDICATION, **Non-US market drug** Volken (multivitamin with green tea extract) Take 1 tablet every morning, Disp: , Rfl:  .  PRESCRIPTION MEDICATION, **Non-US market drug** Betavert 24 (betahistine)  Take 1 tablet by mouth every morning as  directed, Disp: , Rfl:  .  PRESCRIPTION MEDICATION, **Non-US market drug** Bro Zedex SF (guaifenesin/terbutaline/bromhexine) liquid Take by mouth as directed, Disp: , Rfl:  .  PRESCRIPTION MEDICATION, **Non-US market drug** Alerfix M (levocetirizine/montelukast) Take 1 tablet by mouth every morning as directed, Disp: , Rfl:  .  PRESCRIPTION MEDICATION, **Non-US market drug** Alprax 0.5 (alprazolam) Take by mouth as directed, Disp: , Rfl:  .  PRESCRIPTION MEDICATION, **Non-US market drug** Triohale MDI (tiotropium/formoterol/ciclesonide)  Inhale as directed, Disp: , Rfl:  .  carvedilol (COREG) 12.5 MG tablet, Take 1 tablet (12.5 mg total) by mouth 2 (two) times daily with a meal., Disp: 60 tablet, Rfl: 3 .  empagliflozin (JARDIANCE) 25 MG TABS tablet, Take 25 mg by mouth daily., Disp: 30 tablet, Rfl: 2 .  losartan-hydrochlorothiazide (HYZAAR) 50-12.5 MG tablet, Take 1 tablet by mouth daily., Disp: 90 tablet, Rfl: 3 .  metFORMIN (GLUCOPHAGE) 1000 MG tablet, Take 1 tablet (1,000 mg total) by mouth 2 (two) times daily with a meal., Disp: 180 tablet, Rfl: 3 .  predniSONE (DELTASONE) 20 MG tablet, Take 2 tablets (40 mg total) by mouth daily with breakfast for 7 days., Disp: 14 tablet, Rfl: 0   Patient Care Team: [name], MD as PCP - General (Family Medicine)      Objective:   Vitals: BP 132/60 (BP Location: Left Arm, Patient Position: Sitting, Cuff Size: Large)   Pulse (!) 112   Temp 97.6 F (36.4 C) (Oral)   Resp (!) 24   Ht 4\' 10"  (1.473 m)   Wt 174 lb (78.9 kg)   SpO2 97%   BMI 36.37 kg/m    Vitals:   12/19/17 1444  BP: 132/60  Pulse: (!) 112  Resp: (!) 24  Temp: 97.6 F (36.4 C)  TempSrc: Oral  SpO2: 97%  Weight: 174 lb (78.9 kg)  Height: 4\' 10"  (1.473 m)     Physical Exam  Constitutional: She is oriented to person, place, and time. She appears well-developed and well-nourished. No distress.  HENT:  Head: Normocephalic and atraumatic.  Right Ear: External ear  normal.  Left Ear: External ear normal.  Nose: Nose normal.  Mouth/Throat: Oropharynx is clear and moist.  Eyes: Pupils are equal, round, and reactive to light. Conjunctivae and EOM are normal. No scleral icterus.  Neck: Neck supple. No thyromegaly present.  Cardiovascular: Regular rhythm, normal heart sounds and intact distal pulses. Tachycardia  present.  No murmur heard. Pulmonary/Chest:  Initially with tachypnea and decreased breath sounds in b/l bases with wheezing.  After albuterol, slowed RR, diffuse wheezing but no decreased breath sounds.  Abdominal: Soft. Bowel sounds are normal. She exhibits no distension. There is no tenderness. There is no rebound and no guarding.  Musculoskeletal: She exhibits no edema or deformity.  Lymphadenopathy:    She has no cervical adenopathy.  Neurological: She is alert and oriented to person, place, and time.  Skin: Skin is warm and dry. Capillary refill takes less than 2 seconds. No rash noted.  Psychiatric: She has a normal mood and affect. Her behavior is normal.  Vitals reviewed.    Depression Screen PHQ 2/9 Scores 12/19/2017  PHQ - 2 Score 0      Assessment & Plan:    Problem List Items Addressed This Visit      Cardiovascular and Mediastinum   HTN (hypertension)    Well-controlled today Patient would like to transition to medication she can get here off of her medications from [place] Will start [place] 12.5mg  BID and losartan-HCTZ daily in lieu of her current medications Check BMP      Relevant Medications   losartan-hydrochlorothiazide (HYZAAR) 50-12.5 MG tablet   carvedilol (COREG) 12.5 MG tablet     Respiratory   Asthma exacerbation    Initially with tachypnea and decreased breath sounds Given albuterol in room and then with better air movement and slowed RR Does still have diffuse wheezing Encouraged to take Symbicort BID every day as controller and not rescue Use xopenex as rescue prn Treat with prednisone burst Has  pulm f/u next week Return precautions discussed      Relevant Medications   predniSONE (DELTASONE) 20 MG tablet   Acute respiratory failure with hypoxia ([place])    Resolved with no hypoxia today Continue to monitor, especially with asthma exacerbation Has f/u appt with Pulm next week        Digestive   GERD (gastroesophageal reflux disease)    Continue PPI therapy Will convert to omeprazole or other [place] PPI when she runs out of home meds from [place]        Endocrine   Diabetes ([place]) - Primary    Uncontrolled with last A1c 7.9 Too early to recheck A1c, but glucoses have been elevated, especially in the setting of steroids Continue metformin 1000 mg twice daily She is also on glipizide and a DPP 4 from [place] We will stop these and start Jardiance Her BMP reveals appropriate creatinine clearance for Jardiance therapy We can consider GLP-1 in the future Would reserve DPP 4 or sulfonylurea for alternative therapy if these do not improve her A1c, especially given her hypoglycemia risk as she is elderly and has other comorbidities At next visit, we will recheck A1c and update screenings and vaccinations      Relevant Medications   losartan-hydrochlorothiazide (HYZAAR) 50-12.5 MG tablet   metFORMIN (GLUCOPHAGE) 1000 MG tablet   empagliflozin (JARDIANCE) 25 MG TABS tablet     Other   Hyperlipidemia    Continue home meds from [place] of rosuvastatin and clopidogrel Patient adamantly denies that she has had any strokes, CAD, ACS, so I am not sure why she is taking Plavix currently I will try to email her physician in [place] to get more information and medical records to determine why she is taking Plavix According to her daughter, she may be taking this for primary prevention      Relevant Medications  losartan-hydrochlorothiazide (HYZAAR) 50-12.5 MG tablet   carvedilol (COREG) 12.5 MG tablet    Other Visit Diagnoses    Hyponatremia       Relevant Orders   Basic Metabolic Panel  (BMET)   Hypokalemia       Relevant Orders   Basic Metabolic Panel (BMET)       Return in about 1 month (around 01/16/2018) for BP and DM f/u.   The entirety of the information documented in the History of Present Illness, Review of Systems and Physical Exam were personally obtained by me. Portions of this information were initially documented by Raquel Sarna Ratchford, CMA and reviewed by me for thoroughness and accuracy.    [name], MD, MPH [facility] 12/23/2017 1:24 PM

## 2017-12-19 NOTE — Patient Instructions (Signed)

## 2017-12-20 DIAGNOSIS — K219 Gastro-esophageal reflux disease without esophagitis: Secondary | ICD-10-CM | POA: Insufficient documentation

## 2017-12-20 DIAGNOSIS — E785 Hyperlipidemia, unspecified: Secondary | ICD-10-CM

## 2017-12-20 DIAGNOSIS — E1169 Type 2 diabetes mellitus with other specified complication: Secondary | ICD-10-CM | POA: Insufficient documentation

## 2017-12-20 NOTE — Assessment & Plan Note (Addendum)
Well-controlled today Patient would like to transition to medication she can get here off of her medications from [place] Will start Coreg 12.5mg  BID and losartan-HCTZ daily in lieu of her current medications Check BMP

## 2017-12-21 DIAGNOSIS — E871 Hypo-osmolality and hyponatremia: Secondary | ICD-10-CM | POA: Diagnosis not present

## 2017-12-21 DIAGNOSIS — J9601 Acute respiratory failure with hypoxia: Secondary | ICD-10-CM | POA: Diagnosis not present

## 2017-12-21 DIAGNOSIS — J45901 Unspecified asthma with (acute) exacerbation: Secondary | ICD-10-CM | POA: Diagnosis not present

## 2017-12-21 DIAGNOSIS — I10 Essential (primary) hypertension: Secondary | ICD-10-CM | POA: Diagnosis not present

## 2017-12-21 DIAGNOSIS — J441 Chronic obstructive pulmonary disease with (acute) exacerbation: Secondary | ICD-10-CM | POA: Diagnosis not present

## 2017-12-21 DIAGNOSIS — Z7984 Long term (current) use of oral hypoglycemic drugs: Secondary | ICD-10-CM | POA: Diagnosis not present

## 2017-12-21 DIAGNOSIS — J9602 Acute respiratory failure with hypercapnia: Secondary | ICD-10-CM | POA: Diagnosis not present

## 2017-12-21 DIAGNOSIS — E119 Type 2 diabetes mellitus without complications: Secondary | ICD-10-CM | POA: Diagnosis not present

## 2017-12-21 DIAGNOSIS — Z8611 Personal history of tuberculosis: Secondary | ICD-10-CM | POA: Diagnosis not present

## 2017-12-23 NOTE — Assessment & Plan Note (Signed)
Initially with tachypnea and decreased breath sounds Given albuterol in room and then with better air movement and slowed RR Does still have diffuse wheezing Encouraged to take Symbicort BID every day as controller and not rescue Use xopenex as rescue prn Treat with prednisone burst Has pulm f/u next week Return precautions discussed

## 2017-12-23 NOTE — Assessment & Plan Note (Signed)
Resolved with no hypoxia today Continue to monitor, especially with asthma exacerbation Has f/u appt with Pulm next week

## 2017-12-23 NOTE — Assessment & Plan Note (Signed)
Continue home meds from [place] of rosuvastatin and clopidogrel Patient adamantly denies that she has had any strokes, CAD, ACS, so I am not sure why she is taking Plavix currently I will try to email her physician in [place] to get more information and medical records to determine why she is taking Plavix According to her daughter, she may be taking this for primary prevention

## 2017-12-23 NOTE — Assessment & Plan Note (Signed)
Uncontrolled with last A1c 7.9 Too early to recheck A1c, but glucoses have been elevated, especially in the setting of steroids Continue metformin 1000 mg twice daily She is also on glipizide and a DPP 4 from [place] We will stop these and start Jardiance Her BMP reveals appropriate creatinine clearance for Jardiance therapy We can consider GLP-1 in the future Would reserve DPP 4 or sulfonylurea for alternative therapy if these do not improve her A1c, especially given her hypoglycemia risk as she is elderly and has other comorbidities At next visit, we will recheck A1c and update screenings and vaccinations

## 2017-12-23 NOTE — Progress Notes (Signed)
* [facility]     Assessment and Plan:  Asthma/chronic bronchitis, with multiple exacerbations of the last few months. - Severe persistent asthma with persistent wheezing. - Elevated eosinophil count of 300. - Continue triple inhaler with spacer, current prescription is from [place],  asked to use nebulizers 3 times daily.  --Start singulair.  ---Will check RAST and Ige testing  --Depending upon results of blood work will need to start biologic agent if indicated and covered by insurance. --patient has had multiple exacerbations and hospitalizations for AEAsthma, and is high risk of decompensation, rehospitalization, death due to severe asthma.    Dyspnea on exertion. - Secondary to above. - Patient has multiple medical problems and is on a long list of medications, I highly recommended establishing care with a primary care physician.  Orders Placed This Encounter  Procedures  . Allergens w/Total IgE Area 2   Meds ordered this encounter  Medications  . montelukast (SINGULAIR) 10 MG tablet    Sig: Take 1 tablet (10 mg total) by mouth at bedtime.    Dispense:  30 tablet    Refill:  3  . AMBULATORY NON FORMULARY MEDICATION    Sig: Medication Name: Aero chamber use as directed. JA:S50.539    Dispense:  1 each    Refill:  0   Return in about 3 months (around 03/26/2018).   Date: 12/23/2017  MRN# [id number] Whitney Fleming 04/13/1946   Whitney Fleming is a 72 y.o. old female seen in follow up for chief complaint of  Chief Complaint  Patient presents with  . Hospitalization Follow-up    SOB w/activity: NP cough: wheezing   HPI:   The patient is a 72 yo. female with a history of asthma, TB, HTN, DM,  admitted to the hospital for 4 days in April 2019 for AE asthma.   She is from the state of [place], however she is now living with her family here in [place], and plans to stay here. Today she continues to have flare ups and frequent hospitalizations.  She was  demonstated inhaler technique today, she did this poorly and was given a spacer.   Imaging personally reviewed, CXR 11/05/17; changes of chronic bronchitis, right heart enlargement.  **CBC 09/25/17; Abs eos count 300.    Medication:    Current Outpatient Medications:  .  budesonide-formoterol (SYMBICORT) 160-4.5 MCG/ACT inhaler, Inhale 2 puffs into the lungs 2 (two) times daily., Disp: 1 Inhaler, Rfl: 6 .  carvedilol (COREG) 12.5 MG tablet, Take 1 tablet (12.5 mg total) by mouth 2 (two) times daily with a meal., Disp: 60 tablet, Rfl: 3 .  empagliflozin (JARDIANCE) 25 MG TABS tablet, Take 25 mg by mouth daily., Disp: 30 tablet, Rfl: 2 .  losartan-hydrochlorothiazide (HYZAAR) 50-12.5 MG tablet, Take 1 tablet by mouth daily., Disp: 90 tablet, Rfl: 3 .  metFORMIN (GLUCOPHAGE) 1000 MG tablet, Take 1 tablet (1,000 mg total) by mouth 2 (two) times daily with a meal., Disp: 180 tablet, Rfl: 3 .  montelukast (SINGULAIR) 10 MG tablet, Take 1 tablet (10 mg total) by mouth at bedtime., Disp: 30 tablet, Rfl: 2 .  predniSONE (DELTASONE) 20 MG tablet, Take 2 tablets (40 mg total) by mouth daily with breakfast for 7 days., Disp: 14 tablet, Rfl: 0 .  PRESCRIPTION MEDICATION, **Non-US market drug** Tenebite (teneligliptin)  1 tablet by mouth every morning, Disp: , Rfl:  .  PRESCRIPTION MEDICATION, **Non-US market drug** Amaryl MV2 (metformin/glipizide/voglibose) 1 tablet by mouth twice daily before meals, Disp: ,  Rfl:  .  PRESCRIPTION MEDICATION, **Non-US market drug** Carca CR20 (carvedilol controlled release) Take 1 tablet by mouth every evening, Disp: , Rfl:  .  PRESCRIPTION MEDICATION, **Non-US market drug** Eritel CH40 (telmesartan/chlorthaladone) Take 1 tablet by mouth every morning after breakfast, Disp: , Rfl:  .  PRESCRIPTION MEDICATION, **Non-US market drug** Zyrova C (rosuvastatin/clopidigrel) Take 1 capsule by mouth every evening, Disp: , Rfl:  .  PRESCRIPTION MEDICATION, **Non-US market drug** Mashyne  (cholecalciferol) Take 1 tablet by mouth every day after lunch, Disp: , Rfl:  .  PRESCRIPTION MEDICATION, **Non-US market drug** Rabiprime DSR (rabeprazole/domperidone) Take 1 capsule by mouth every morning as directed, Disp: , Rfl:  .  PRESCRIPTION MEDICATION, **Non-US market drug** Volken (multivitamin with green tea extract) Take 1 tablet every morning, Disp: , Rfl:  .  PRESCRIPTION MEDICATION, **Non-US market drug** Betavert 24 (betahistine)  Take 1 tablet by mouth every morning as directed, Disp: , Rfl:  .  PRESCRIPTION MEDICATION, **Non-US market drug** Bro Zedex SF (guaifenesin/terbutaline/bromhexine) liquid Take by mouth as directed, Disp: , Rfl:    Allergies:  Patient has no known allergies.  Review of Systems:  Constitutional: Feels well. Cardiovascular: No chest pain.  Pulmonary: Denies hemoptysis The remainder of systems were reviewed and were found to be negative other than what is documented in the HPI.   Physical Examination:   VS: BP (!) 118/58 (BP Location: Right Arm, Cuff Size: Normal)   Pulse 98   Ht 4\' 10"  (1.473 m)   Wt 173 lb (78.5 kg)   SpO2 95%   BMI 36.16 kg/m   General Appearance: No distress  Neuro:without focal findings, mental status, speech normal, alert and oriented HEENT: PERRLA, EOM intact Pulmonary: scattered wheezing.   CardiovascularNormal S1,S2.  No m/r/g.  Abdomen: Benign, Soft, non-tender, No masses Renal:  No costovertebral tenderness  GU:  No performed at this time. Endoc: No evident thyromegaly, no signs of acromegaly or Cushing features Skin:   warm, no rashes, no ecchymosis  Extremities: normal, no cyanosis, clubbing.       LABORATORY PANEL:   CBC No results for input(s): WBC, HGB, HCT, PLT in the last 168 hours. ------------------------------------------------------------------------------------------------------------------  Chemistries  No results for input(s): NA, K, CL, CO2, GLUCOSE, BUN, CREATININE, CALCIUM, MG, AST,  ALT, ALKPHOS, BILITOT in the last 168 hours.  Invalid input(s): GFRCGP ------------------------------------------------------------------------------------------------------------------  Cardiac Enzymes No results for input(s): TROPONINI in the last 168 hours. ------------------------------------------------------------  RADIOLOGY:   No results found for this or any previous visit. Results for orders placed during the hospital encounter of 11/05/17  DG Chest 2 View   Narrative CLINICAL DATA:  Worsening shortness of breath since last night. History of COPD with current symptoms not responsive to medication. Minimally productive cough.  EXAM: CHEST - 2 VIEW  COMPARISON:  Portable chest x-ray of September 25, 2017  FINDINGS: The lungs are adequately inflated. The interstitial markings are coarse. There is no alveolar infiltrate. There is no pleural effusion. The heart is top-normal in size. The pulmonary vascularity is prominent centrally on the right but stable. There is calcification in the wall of the aortic arch. The bony thorax is unremarkable.  IMPRESSION: Chronic bronchitic changes with possible superimposed acute bronchitis. No alveolar pneumonia nor CHF.  Thoracic aortic atherosclerosis.   Electronically Signed   By: David  Martinique M.D.   On: 11/05/2017 17:01    ------------------------------------------------------------------------------------------------------------------  Thank  you for allowing [facility]  Pulmonary, Critical Care to assist in the care of your patient. Our  recommendations are noted above.  Please contact us if we can be of further service.   Marda Stalker, M.D., F.C.C.P.  Board Certified in Internal Medicine, Pulmonary Medicine, [place], and Sleep Medicine.  [facility] Office Number: [phone]  12/23/2017

## 2017-12-23 NOTE — Assessment & Plan Note (Signed)
Continue PPI therapy Will convert to omeprazole or other [place] PPI when she runs out of home meds from [place]

## 2017-12-24 ENCOUNTER — Other Ambulatory Visit
Admission: RE | Admit: 2017-12-24 | Discharge: 2017-12-24 | Disposition: A | Discharge status: Home / Self Care | Payer: Medicaid Other | Source: Ambulatory Visit | Attending: Internal Medicine | Admitting: Internal Medicine

## 2017-12-24 ENCOUNTER — Encounter: Payer: Self-pay | Admitting: Internal Medicine

## 2017-12-24 ENCOUNTER — Other Ambulatory Visit: Payer: Self-pay | Admitting: Hospice and Palliative Medicine

## 2017-12-24 ENCOUNTER — Ambulatory Visit (INDEPENDENT_AMBULATORY_CARE_PROVIDER_SITE_OTHER): Payer: Medicaid Other | Admitting: Internal Medicine

## 2017-12-24 ENCOUNTER — Telehealth: Payer: Self-pay | Admitting: Internal Medicine

## 2017-12-24 VITALS — BP 118/58 | HR 98 | Ht <= 58 in | Wt 173.0 lb

## 2017-12-24 DIAGNOSIS — I10 Essential (primary) hypertension: Secondary | ICD-10-CM | POA: Diagnosis not present

## 2017-12-24 DIAGNOSIS — E119 Type 2 diabetes mellitus without complications: Secondary | ICD-10-CM | POA: Diagnosis not present

## 2017-12-24 DIAGNOSIS — R0609 Other forms of dyspnea: Secondary | ICD-10-CM | POA: Diagnosis not present

## 2017-12-24 DIAGNOSIS — Z7984 Long term (current) use of oral hypoglycemic drugs: Secondary | ICD-10-CM | POA: Diagnosis not present

## 2017-12-24 DIAGNOSIS — J9602 Acute respiratory failure with hypercapnia: Secondary | ICD-10-CM | POA: Diagnosis not present

## 2017-12-24 DIAGNOSIS — J9601 Acute respiratory failure with hypoxia: Secondary | ICD-10-CM | POA: Diagnosis not present

## 2017-12-24 DIAGNOSIS — E871 Hypo-osmolality and hyponatremia: Secondary | ICD-10-CM | POA: Diagnosis not present

## 2017-12-24 DIAGNOSIS — J45909 Unspecified asthma, uncomplicated: Secondary | ICD-10-CM | POA: Diagnosis not present

## 2017-12-24 DIAGNOSIS — J441 Chronic obstructive pulmonary disease with (acute) exacerbation: Secondary | ICD-10-CM | POA: Diagnosis not present

## 2017-12-24 DIAGNOSIS — J45901 Unspecified asthma with (acute) exacerbation: Secondary | ICD-10-CM | POA: Diagnosis not present

## 2017-12-24 DIAGNOSIS — J4550 Severe persistent asthma, uncomplicated: Secondary | ICD-10-CM | POA: Diagnosis not present

## 2017-12-24 DIAGNOSIS — Z8611 Personal history of tuberculosis: Secondary | ICD-10-CM | POA: Diagnosis not present

## 2017-12-24 MED ORDER — MONTELUKAST SODIUM 10 MG PO TABS: 10.0000 mg | ORAL_TABLET | Freq: Every day | ORAL | 3 refills | Status: DC

## 2017-12-24 MED ORDER — AMBULATORY NON FORMULARY MEDICATION: 0 refills | Status: AC

## 2017-12-24 NOTE — Patient Instructions (Addendum)
Will check RAST and IgE (allergy test) for asthma.  Continue inhaler 2 puffs daily.  Will start montelukast, after doing blood test.

## 2017-12-24 NOTE — Telephone Encounter (Signed)
Spoke with [facility] tech. Montelukast was picked up on 12/06/17 and will be available again in 4 days. New rx for Xopenex called in with 10 refills. Patient's daughter to be notified.

## 2017-12-24 NOTE — Telephone Encounter (Signed)
°*  STAT* If patient is at the pharmacy, call can be transferred to refill team.   1. Which medications need to be refilled? (please list name of each medication and dose if known)    xopenex nebulizer ? rx dose   2. Which pharmacy/location (including street and city if local pharmacy) is medication to be sent to? [place]   3. Do they need a 30 day or 90 day supply? [place]

## 2017-12-26 ENCOUNTER — Other Ambulatory Visit: Payer: Medicaid Other | Admitting: Hospice and Palliative Medicine

## 2017-12-26 ENCOUNTER — Other Ambulatory Visit: Payer: Self-pay | Admitting: Family Medicine

## 2017-12-26 DIAGNOSIS — Z7984 Long term (current) use of oral hypoglycemic drugs: Secondary | ICD-10-CM | POA: Diagnosis not present

## 2017-12-26 DIAGNOSIS — J441 Chronic obstructive pulmonary disease with (acute) exacerbation: Secondary | ICD-10-CM | POA: Diagnosis not present

## 2017-12-26 DIAGNOSIS — E876 Hypokalemia: Secondary | ICD-10-CM | POA: Diagnosis not present

## 2017-12-26 DIAGNOSIS — Z515 Encounter for palliative care: Secondary | ICD-10-CM

## 2017-12-26 DIAGNOSIS — Z8611 Personal history of tuberculosis: Secondary | ICD-10-CM | POA: Diagnosis not present

## 2017-12-26 DIAGNOSIS — J9602 Acute respiratory failure with hypercapnia: Secondary | ICD-10-CM | POA: Diagnosis not present

## 2017-12-26 DIAGNOSIS — J45901 Unspecified asthma with (acute) exacerbation: Secondary | ICD-10-CM | POA: Diagnosis not present

## 2017-12-26 DIAGNOSIS — E871 Hypo-osmolality and hyponatremia: Secondary | ICD-10-CM | POA: Diagnosis not present

## 2017-12-26 DIAGNOSIS — J9601 Acute respiratory failure with hypoxia: Secondary | ICD-10-CM | POA: Diagnosis not present

## 2017-12-26 DIAGNOSIS — E119 Type 2 diabetes mellitus without complications: Secondary | ICD-10-CM | POA: Diagnosis not present

## 2017-12-26 DIAGNOSIS — I10 Essential (primary) hypertension: Secondary | ICD-10-CM | POA: Diagnosis not present

## 2017-12-26 LAB — ALLERGENS W/TOTAL IGE AREA 2
Alternaria Alternata IgE: <0.1 kU/L
Bermuda Grass IgE: <0.1 kU/L
Cockroach, German IgE: <0.1 kU/L
Cottonwood IgE: <0.1 kU/L
IGE (IMMUNOGLOBULIN E), SERUM: 5 [IU]/mL — AB (ref 6–495)
Johnson Grass IgE: <0.1 kU/L
Pecan, Hickory IgE: <0.1 kU/L
Penicillium Chrysogen IgE: <0.1 kU/L
Pigweed, Rough IgE: <0.1 kU/L
Ragweed, Short IgE: <0.1 kU/L
Timothy Grass IgE: <0.1 kU/L
White Mulberry IgE: <0.1 kU/L

## 2017-12-26 MED ORDER — HYDROCHLOROTHIAZIDE 12.5 MG PO TABS: 12.5000 mg | ORAL_TABLET | Freq: Every day | ORAL | 3 refills | Status: DC

## 2017-12-26 MED ORDER — LOSARTAN POTASSIUM 50 MG PO TABS: 50.0000 mg | ORAL_TABLET | Freq: Every day | ORAL | 3 refills | Status: DC

## 2017-12-26 NOTE — Progress Notes (Signed)
PALLIATIVE CARE CONSULT VISIT   PATIENT NAME: Whitney Fleming DOB: [birth year] MRN: [id number]  PRIMARY CARE PROVIDER:   [name], MD  REFERRING PROVIDER:  [name], MD [address] [place], [place] [address]  RESPONSIBLE PARTY:   Self  ASSESSMENT:     I met with patient, husband, and daughter in the home. Patient nor husband were able to communicate with me due to a language barrier. Patient moved here several months ago from [place] to live with her daughter. There is a plan for her to stay permanently in the [place], although patient may move to another state to live with another daughter.   Patient has been followed by home health nursing and physical therapy. Family feel she has improved with their care. PT is discontinuing services as of today. Would patient be appropriate for pulmonary rehab? I discussed this option with PT who thought that it might be advantageous for patient.   At baseline, patient is mostly functionally independent. She is limited at times by wheezing and shortness of breath. However, she is ambulatory without use of an assistive device. She has had no recent falls. Her husband and daughter are in the home to assist if needed.   Daughter had multiple questions regarding patient's medication regimen. I answered these to the best of my ability after reviewing PCP's note. However, I did advise daughter to follow up with PCP regarding patient's medications. It appears that patient is on multiple medications from [place] and that these will be transitioned to [place] available meds after completion of current home supply.   Patient has no acute or distressing symptoms during today's visit. She is comfortable appearing.   Patient has no advance directives in the home. I reviewed these with daughter and left a copy in the home that family may complete if interested.   RECOMMENDATIONS and PLAN:  1. Follow up with PCP 2. Recommend completion of  advance directives.  3. Continue supportive care.   I spent 60 minutes providing this consultation,  from 1000 to 1100. More than 50% of the time in this consultation was spent coordinating communication.   HISTORY OF PRESENT ILLNESS:  Whitney Fleming is a 72 y.o. year old female with multiple medical problems including COPD, DM, HTN, and h/o TB, who was recently hospitalized 12/02/17 to 12/05/17 with COPD exacerbation. This was patient's second hospitlization in two months for same. Patient was discharged home with home health. Palliative Care was asked to help address goals of care.   CODE STATUS: FC  PPS: 40% HOSPICE ELIGIBILITY/DIAGNOSIS: NO  PAST MEDICAL HISTORY:  Past Medical History:  Diagnosis Date  . Asthma   . Cardiomegaly   . Diabetes mellitus without complication ([place])   . Hypertension   . Hyponatremia   . TB (tuberculosis)     SOCIAL HX:  Social History   Tobacco Use  . Smoking status: Never Smoker  . Smokeless tobacco: Never Used  Substance Use Topics  . Alcohol use: Never    Frequency: Never    ALLERGIES: No Known Allergies   PERTINENT MEDICATIONS:  Outpatient Encounter Medications as of 12/26/2017  Medication Sig  . AMBULATORY NON FORMULARY MEDICATION Medication Name: Aero chamber use as directed. DX:J45.909  . budesonide-formoterol (SYMBICORT) 160-4.5 MCG/ACT inhaler Inhale 2 puffs into the lungs 2 (two) times daily.  . carvedilol (COREG) 12.5 MG tablet Take 1 tablet (12.5 mg total) by mouth 2 (two) times daily with a meal.  . empagliflozin (JARDIANCE)  25 MG TABS tablet Take 25 mg by mouth daily.  Marland Kitchen losartan-hydrochlorothiazide (HYZAAR) 50-12.5 MG tablet Take 1 tablet by mouth daily.  . metFORMIN (GLUCOPHAGE) 1000 MG tablet Take 1 tablet (1,000 mg total) by mouth 2 (two) times daily with a meal.  . montelukast (SINGULAIR) 10 MG tablet Take 1 tablet (10 mg total) by mouth at bedtime.  Marland Kitchen PRESCRIPTION MEDICATION **Non-US market drug** Tenebite (teneligliptin)  1  tablet by mouth every morning  . PRESCRIPTION MEDICATION **Non-US market drug** Amaryl MV2 (metformin/glipizide/voglibose) 1 tablet by mouth twice daily before meals  . PRESCRIPTION MEDICATION **Non-US market drug** Carca CR20 (carvedilol controlled release) Take 1 tablet by mouth every evening  . PRESCRIPTION MEDICATION **Non-US market drug** Eritel CH40 (telmesartan/chlorthaladone) Take 1 tablet by mouth every morning after breakfast  . PRESCRIPTION MEDICATION **Non-US market drug** Zyrova C (rosuvastatin/clopidigrel) Take 1 capsule by mouth every evening  . PRESCRIPTION MEDICATION **Non-US market drug** Mashyne (cholecalciferol) Take 1 tablet by mouth every day after lunch  . PRESCRIPTION MEDICATION **Non-US market drug** Rabiprime DSR (rabeprazole/domperidone) Take 1 capsule by mouth every morning as directed  . PRESCRIPTION MEDICATION **Non-US market drug** Volken (multivitamin with green tea extract) Take 1 tablet every morning  . PRESCRIPTION MEDICATION **Non-US market drug** Betavert 24 (betahistine)  Take 1 tablet by mouth every morning as directed  . PRESCRIPTION MEDICATION **Non-US market drug** Bro Zedex SF (guaifenesin/terbutaline/bromhexine) liquid Take by mouth as directed   No facility-administered encounter medications on file as of 12/26/2017.     PHYSICAL EXAM:   General: NAD, frail appearing, obese Cardiovascular: regular rate and rhythm Pulmonary: clear ant/post fields Abdomen: soft, nontender, + bowel sounds GU: no suprapubic tenderness Extremities: no edema, no joint deformities Skin: no rashes Neurological: Weakness but otherwise nonfocal  Irean Hong, NP

## 2017-12-27 ENCOUNTER — Telehealth: Payer: Self-pay

## 2017-12-27 DIAGNOSIS — E875 Hyperkalemia: Secondary | ICD-10-CM

## 2017-12-27 LAB — BASIC METABOLIC PANEL
BUN/Creatinine Ratio: 18 (ref 12–28)
BUN: 19 mg/dL (ref 8–27)
CALCIUM: 8.7 mg/dL (ref 8.7–10.3)
CO2: 16 mmol/L — AB (ref 20–29)
Chloride: 89 mmol/L — ABNORMAL LOW (ref 96–106)
Creatinine, Ser: 1.04 mg/dL — ABNORMAL HIGH (ref 0.57–1.00)
GFR calc Af Amer: 62 mL/min/{1.73_m2} (ref >59)
GFR, EST NON AFRICAN AMERICAN: 53 mL/min/{1.73_m2} — AB (ref >59)
Glucose: 185 mg/dL — ABNORMAL HIGH (ref 65–99)
POTASSIUM: 5.6 mmol/L — AB (ref 3.5–5.2)
SODIUM: 128 mmol/L — AB (ref 134–144)

## 2017-12-27 NOTE — Telephone Encounter (Signed)
-----   Message from [name], MD sent at 12/27/2017  2:29 PM EDT ----- Fairly stable kidney function.  Potassium is elevated again.  Please stop any multivitamins or other potassium supplement that she may be taking.  Let us recheck renal function panel next week to recheck potassium.  [name], MD, MPH [facility] 12/27/2017 2:29 PM

## 2017-12-27 NOTE — Telephone Encounter (Signed)
lmtcb

## 2017-12-28 DIAGNOSIS — J441 Chronic obstructive pulmonary disease with (acute) exacerbation: Secondary | ICD-10-CM | POA: Diagnosis not present

## 2017-12-28 DIAGNOSIS — E871 Hypo-osmolality and hyponatremia: Secondary | ICD-10-CM | POA: Diagnosis not present

## 2017-12-28 DIAGNOSIS — Z7984 Long term (current) use of oral hypoglycemic drugs: Secondary | ICD-10-CM | POA: Diagnosis not present

## 2017-12-28 DIAGNOSIS — I10 Essential (primary) hypertension: Secondary | ICD-10-CM | POA: Diagnosis not present

## 2017-12-28 DIAGNOSIS — J9601 Acute respiratory failure with hypoxia: Secondary | ICD-10-CM | POA: Diagnosis not present

## 2017-12-28 DIAGNOSIS — E119 Type 2 diabetes mellitus without complications: Secondary | ICD-10-CM | POA: Diagnosis not present

## 2017-12-28 DIAGNOSIS — Z8611 Personal history of tuberculosis: Secondary | ICD-10-CM | POA: Diagnosis not present

## 2017-12-28 DIAGNOSIS — J9602 Acute respiratory failure with hypercapnia: Secondary | ICD-10-CM | POA: Diagnosis not present

## 2017-12-28 DIAGNOSIS — J45901 Unspecified asthma with (acute) exacerbation: Secondary | ICD-10-CM | POA: Diagnosis not present

## 2018-01-01 ENCOUNTER — Telehealth: Payer: Self-pay | Admitting: Family Medicine

## 2018-01-01 NOTE — Telephone Encounter (Signed)
Pt's daughter Malachi Paradise is requesting call back to discuss what medications pt should and shouldn't be taking. Please advise. Thanks TNP

## 2018-01-02 NOTE — Telephone Encounter (Signed)
lmtcb

## 2018-01-02 NOTE — Telephone Encounter (Signed)
Pt's daughter wants to call to discuss medication

## 2018-01-02 NOTE — Telephone Encounter (Signed)
Daughter advised of results. Lab ordered and pt will get this done ASAP.

## 2018-01-08 ENCOUNTER — Ambulatory Visit: Payer: Medicaid Other | Admitting: Family Medicine

## 2018-01-09 ENCOUNTER — Ambulatory Visit: Payer: Medicaid Other

## 2018-01-09 NOTE — Telephone Encounter (Signed)
This has been discussed with daughter.

## 2018-01-14 ENCOUNTER — Ambulatory Visit (INDEPENDENT_AMBULATORY_CARE_PROVIDER_SITE_OTHER): Payer: Medicaid Other | Admitting: Family Medicine

## 2018-01-14 ENCOUNTER — Ambulatory Visit: Payer: Self-pay | Admitting: Family Medicine

## 2018-01-14 ENCOUNTER — Encounter: Payer: Self-pay | Admitting: Family Medicine

## 2018-01-14 VITALS — BP 112/50 | HR 90 | Temp 97.4°F | Resp 24 | Wt 174.0 lb

## 2018-01-14 DIAGNOSIS — Z7902 Long term (current) use of antithrombotics/antiplatelets: Secondary | ICD-10-CM | POA: Diagnosis not present

## 2018-01-14 DIAGNOSIS — E1121 Type 2 diabetes mellitus with diabetic nephropathy: Secondary | ICD-10-CM

## 2018-01-14 DIAGNOSIS — Z23 Encounter for immunization: Secondary | ICD-10-CM | POA: Diagnosis not present

## 2018-01-14 DIAGNOSIS — R39198 Other difficulties with micturition: Secondary | ICD-10-CM | POA: Diagnosis not present

## 2018-01-14 DIAGNOSIS — I10 Essential (primary) hypertension: Secondary | ICD-10-CM | POA: Diagnosis not present

## 2018-01-14 DIAGNOSIS — E785 Hyperlipidemia, unspecified: Secondary | ICD-10-CM

## 2018-01-14 DIAGNOSIS — Z1159 Encounter for screening for other viral diseases: Secondary | ICD-10-CM | POA: Diagnosis not present

## 2018-01-14 DIAGNOSIS — J4541 Moderate persistent asthma with (acute) exacerbation: Secondary | ICD-10-CM

## 2018-01-14 LAB — POCT URINALYSIS DIPSTICK
Bilirubin, UA: NEGATIVE
Blood, UA: NEGATIVE
Glucose, UA: NEGATIVE
KETONES UA: NEGATIVE
Leukocytes, UA: NEGATIVE
NITRITE UA: NEGATIVE
PROTEIN UA: NEGATIVE
Spec Grav, UA: <=1.005 — AB (ref 1.010–1.025)
Urobilinogen, UA: 0.2 E.U./dL
pH, UA: 5 (ref 5.0–8.0)

## 2018-01-14 LAB — POCT GLYCOSYLATED HEMOGLOBIN (HGB A1C)
Est. average glucose Bld gHb Est-mCnc: 206
HEMOGLOBIN A1C: 8.8 % — AB (ref 4.0–5.6)

## 2018-01-14 MED ORDER — PREDNISONE 20 MG PO TABS: 40.0000 mg | ORAL_TABLET | Freq: Every day | ORAL | 0 refills | Status: AC

## 2018-01-14 NOTE — Progress Notes (Signed)
Patient: Whitney Fleming Female    DOB: [birth year]   72 y.o.   MRN: [id number] Visit Date: 01/14/2018  Today's Provider: Lavon Paganini, MD   I, Martha Clan, CMA, am acting as scribe for Lavon Paganini, MD.  Chief Complaint  Patient presents with  . Diabetes   Subjective:    HPI      Diabetes Mellitus Type II, Follow-up:   Lab Results  Component Value Date   HGBA1C 8.8 (A) 01/14/2018   HGBA1C 8.1 (H) 09/27/2017   HGBA1C 7.9 (H) 12/25/2012    Last seen for diabetes 1 months ago.  Management since then includes continuing Metformin 1000 mg po BID. D/C Glipizide and DPP 4, and start Jardiance. She reports good compliance with treatment.  She has not started any of the meds prescribed at last visit.  She is still taking meds from [place] and is confused about what to take and what to stop She is not having side effects. Current symptoms include none and have been stable. Home blood sugar records: postprandial range: typically in 170s  Episodes of hypoglycemia? no   Current Insulin Regimen: n/a Most Recent Eye Exam: unknown Weight trend: stable Prior visit with dietician: no Current diet: in general, a "healthy" diet   Current exercise: none  Pertinent Labs:    Component Value Date/Time   CHOL 91 12/25/2012 0154   TRIG 143 12/25/2012 0154   HDL 26 (L) 12/25/2012 0154   LDLCALC 36 12/25/2012 0154   CREATININE 1.04 (H) 12/26/2017 0837   CREATININE 1.16 12/26/2012 0545    Wt Readings from Last 3 Encounters:  01/14/18 174 lb (78.9 kg)  12/24/17 173 lb (78.5 kg)  12/19/17 174 lb (78.9 kg)    ------------------------------------------------------------------------ Pt mentions she has difficulty passing urine. She has been prescribed HCTZ, but is unaware if she is taking this. She states she is not drinking "much" water. About 1.5 L daily.  She denies urinary urgency, frequency, fever, hematuria, abdominal pain.  She does continue to take  antihypertensives from [place] as well, other than Coreg which she switched to [place] version.  She is also taking Crestor/Plavix combo.  They were told that she needs to continue Plavix indefinitely.   No Known Allergies   Current Outpatient Medications:  .  AMBULATORY NON FORMULARY MEDICATION, Medication Name: Aero chamber use as directed. 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  montelukast (SINGULAIR) 10 MG tablet, Take 1 tablet (10 mg total) by mouth at bedtime., Disp: 30 tablet, Rfl: 3 .  PRESCRIPTION MEDICATION, **Non-US market drug** Zyrova C (rosuvastatin/clopidigrel) Take 1 capsule by mouth every evening, Disp: , Rfl:  .  budesonide-formoterol (SYMBICORT) 160-4.5 MCG/ACT inhaler, Inhale 2 puffs into the lungs 2 (two) times daily., Disp: 1 Inhaler, Rfl: 6 .  carvedilol (COREG) 12.5 MG tablet, Take 1 tablet (12.5 mg total) by mouth 2 (two) times daily with a meal., Disp: 60 tablet, Rfl: 3 .  empagliflozin (JARDIANCE) 25 MG TABS tablet, Take 25 mg by mouth daily., Disp: 30 tablet, Rfl: 2 .  losartan (COZAAR) 50 MG tablet, Take 1 tablet (50 mg total) by mouth daily., Disp: 90 tablet, Rfl: 3 .  metFORMIN (GLUCOPHAGE) 1000 MG tablet, Take 1 tablet (1,000 mg total) by mouth 2 (two) times daily with a meal., Disp: 180 tablet, Rfl: 3 .  predniSONE (DELTASONE) 20 MG tablet, Take 2 tablets (40 mg total) by mouth daily with breakfast for 7 days., Disp: 14 tablet, Rfl: 0  Review  of Systems  Constitutional: Positive for fatigue.  Respiratory: Positive for shortness of breath and wheezing.   Cardiovascular: Positive for leg swelling. Negative for chest pain (sometimes chest pain is present).  Gastrointestinal: Positive for abdominal distention.  Endocrine: Negative for polydipsia and polyuria.  Genitourinary: Positive for decreased urine volume and difficulty urinating. Negative for frequency, hematuria, menstrual problem and urgency.  Neurological: Negative for dizziness.  Psychiatric/Behavioral: Positive  for sleep disturbance.    Social History   Tobacco Use  . Smoking status: Never Smoker  . Smokeless tobacco: Never Used  Substance Use Topics  . Alcohol use: Never    Frequency: Never   Objective:   BP (!) 112/50 (BP Location: Left Arm, Patient Position: Sitting, Cuff Size: Large)   Pulse 90   Temp (!) 97.4 F (36.3 C) (Oral)   Resp (!) 24   Wt 174 lb (78.9 kg)   SpO2 98%   BMI 36.37 kg/m  Vitals:   01/14/18 1056  BP: (!) 112/50  Pulse: 90  Resp: (!) 24  Temp: (!) 97.4 F (36.3 C)  TempSrc: Oral  SpO2: 98%  Weight: 174 lb (78.9 kg)     Physical Exam  Constitutional: She is oriented to person, place, and time. She appears well-developed and well-nourished. No distress.  HENT:  Head: Normocephalic and atraumatic.  Right Ear: External ear normal.  Left Ear: External ear normal.  Nose: Nose normal.  Mouth/Throat: Oropharynx is clear and moist. No oropharyngeal exudate.  Eyes: Pupils are equal, round, and reactive to light. Conjunctivae are normal.  Neck: Neck supple. No thyromegaly present.  Cardiovascular: Normal rate, regular rhythm, normal heart sounds and intact distal pulses.  No murmur heard. Pulmonary/Chest: Effort normal. No respiratory distress. She has wheezes (diffusely).  Abdominal: Soft. Bowel sounds are normal. She exhibits no distension. There is no tenderness.  Musculoskeletal: She exhibits no edema or deformity.  Lymphadenopathy:    She has no cervical adenopathy.  Neurological: She is alert and oriented to person, place, and time. No cranial nerve deficit.  Skin: Skin is warm and dry. Capillary refill takes less than 2 seconds. No rash noted.  Psychiatric: She has a normal mood and affect. Her behavior is normal.     Results for orders placed or performed in visit on 01/14/18  POCT glycosylated hemoglobin (Hb A1C)  Result Value Ref Range   Hemoglobin A1C 8.8 (A) 4.0 - 5.6 %   Est. average glucose Bld gHb Est-mCnc 206   POCT urinalysis  dipstick  Result Value Ref Range   Color, UA     Clarity, UA     Glucose, UA Negative Negative   Bilirubin, UA Negative    Ketones, UA Negative    Spec Grav, UA <=1.005 (A) 1.010 - 1.025   Blood, UA Negative    pH, UA 5.0 5.0 - 8.0   Protein, UA Negative Negative   Urobilinogen, UA 0.2 0.2 or 1.0 E.U./dL   Nitrite, UA Negative    Leukocytes, UA Negative Negative   Appearance     Odor         Assessment & Plan:   Problem List Items Addressed This Visit      Cardiovascular and Mediastinum   HTN (hypertension)    Slightly hypotensive today We will DC her medications from [place] Continue Coreg at current dose Start losartan 50 mg daily We will hold HCTZ pending next blood pressure check Reviewed recent metabolic panel        Respiratory  Asthma exacerbation    Patient with tachypnea initially As she sits, this does improve She does have known dyspnea on exertion She still does have diffuse wheezing She is taking her Symbicort as prescribed We will treat again with prednisone burst She is followed by pulmonology and undergoing allergy testing Also continue Singulair      Relevant Medications   predniSONE (DELTASONE) 20 MG tablet     Endocrine   Diabetes ([place]) - Primary    Uncontrolled with A1c 8.8 today She has been reportedly taking her medications from [place] with good compliance, but it is unclear to me exactly what she is taking which she is not taking We will stop all of her medications from [place] Start metformin 1000 mg twice daily and Jardiance 25 mg daily instead We can consider GLP-1 in the future Her last BMP reveals appropriate creatinine clearance for Jardiance therapy Would reserve DPP 4 or sulfonylurea for alternative therapy given her risk of hypoglycemia with her comorbidities and age Advised to check fasting glucoses daily Referral to ophthalmology for eye exam      Relevant Orders   POCT glycosylated hemoglobin (Hb A1C) (Completed)    Ambulatory referral to Ophthalmology   Pneumococcal polysaccharide vaccine 23-valent greater than or equal to 2yo subcutaneous/IM (Completed)     Other   Hyperlipidemia    Recheck fasting lipid panel Continue her rosuvastatin and clopidogrel combo pill from [place] I have not gotten any information about why she is taking Plavix, but they are adamant that she needs to stay on this, so we will continue at this time      Relevant Orders   Lipid panel    Other Visit Diagnoses    Long term (current) use of antithrombotics/antiplatelets       Relevant Orders   CBC   Need for hepatitis C screening test       Relevant Orders   Hepatitis C Antibody   Decreased urine stream       Relevant Orders   POCT urinalysis dipstick (Completed)   CULTURE, URINE COMPREHENSIVE       Return in about 6 weeks (around 02/25/2018) for diabetes and BP f/u.   Patient declines breast and colon cancer screening.  She and her daughter state that they do not wish to have more interventions because they do not think they would seek treatment if cancer was found.   The entirety of the information documented in the History of Present Illness, Review of Systems and Physical Exam were personally obtained by me. Portions of this information were initially documented by Raquel Sarna Ratchford, CMA and reviewed by me for thoroughness and accuracy.    [name], MD, MPH [facility] 01/15/2018 9:00 AM

## 2018-01-14 NOTE — Patient Instructions (Signed)

## 2018-01-15 ENCOUNTER — Encounter: Payer: Self-pay | Admitting: Family Medicine

## 2018-01-15 NOTE — Assessment & Plan Note (Signed)
Slightly hypotensive today We will DC her medications from [place] Continue Coreg at current dose Start losartan 50 mg daily We will hold HCTZ pending next blood pressure check Reviewed recent metabolic panel

## 2018-01-15 NOTE — Assessment & Plan Note (Signed)
Patient with tachypnea initially As she sits, this does improve She does have known dyspnea on exertion She still does have diffuse wheezing She is taking her Symbicort as prescribed We will treat again with prednisone burst She is followed by pulmonology and undergoing allergy testing Also continue Singulair

## 2018-01-15 NOTE — Assessment & Plan Note (Addendum)
Uncontrolled with A1c 8.8 today She has been reportedly taking her medications from [place] with good compliance, but it is unclear to me exactly what she is taking which she is not taking We will stop all of her medications from [place] Start metformin 1000 mg twice daily and Jardiance 25 mg daily instead We can consider GLP-1 in the future Her last BMP reveals appropriate creatinine clearance for Jardiance therapy Would reserve DPP 4 or sulfonylurea for alternative therapy given her risk of hypoglycemia with her comorbidities and age Advised to check fasting glucoses daily Referral to ophthalmology for eye exam

## 2018-01-15 NOTE — Assessment & Plan Note (Signed)
Recheck fasting lipid panel Continue her rosuvastatin and clopidogrel combo pill from [place] I have not gotten any information about why she is taking Plavix, but they are adamant that she needs to stay on this, so we will continue at this time

## 2018-01-17 ENCOUNTER — Telehealth: Payer: Self-pay

## 2018-01-17 DIAGNOSIS — E785 Hyperlipidemia, unspecified: Secondary | ICD-10-CM | POA: Diagnosis not present

## 2018-01-17 DIAGNOSIS — D649 Anemia, unspecified: Secondary | ICD-10-CM | POA: Diagnosis not present

## 2018-01-17 DIAGNOSIS — Z7902 Long term (current) use of antithrombotics/antiplatelets: Secondary | ICD-10-CM | POA: Diagnosis not present

## 2018-01-17 DIAGNOSIS — Z1159 Encounter for screening for other viral diseases: Secondary | ICD-10-CM | POA: Diagnosis not present

## 2018-01-17 LAB — CULTURE, URINE COMPREHENSIVE

## 2018-01-17 NOTE — Telephone Encounter (Signed)
-----   Message from [name], MD sent at 01/17/2018  2:36 PM EDT ----- Urine culture without growth of any bacteria.  [name], MD, MPH [facility] 01/17/2018 2:36 PM

## 2018-01-17 NOTE — Telephone Encounter (Signed)
Left message advising daughter. OK per DPR.

## 2018-01-18 LAB — LIPID PANEL
CHOLESTEROL TOTAL: 110 mg/dL (ref 100–199)
Chol/HDL Ratio: 3 ratio (ref 0.0–4.4)
HDL: 37 mg/dL — ABNORMAL LOW (ref >39)
LDL Calculated: 52 mg/dL (ref 0–99)
Triglycerides: 103 mg/dL (ref 0–149)
VLDL Cholesterol Cal: 21 mg/dL (ref 5–40)

## 2018-01-18 LAB — HEPATITIS C ANTIBODY

## 2018-01-18 LAB — CBC
HEMATOCRIT: 28.1 % — AB (ref 34.0–46.6)
HEMOGLOBIN: 9.4 g/dL — AB (ref 11.1–15.9)
MCH: 27 pg (ref 26.6–33.0)
MCHC: 33.5 g/dL (ref 31.5–35.7)
MCV: 81 fL (ref 79–97)
Platelets: 410 10*3/uL (ref 150–450)
RBC: 3.48 x10E6/uL — ABNORMAL LOW (ref 3.77–5.28)
RDW: 14.5 % (ref 12.3–15.4)
WBC: 11.1 10*3/uL — AB (ref 3.4–10.8)

## 2018-01-20 ENCOUNTER — Telehealth: Payer: Self-pay

## 2018-01-20 ENCOUNTER — Ambulatory Visit: Payer: Self-pay | Admitting: Family Medicine

## 2018-01-20 NOTE — Progress Notes (Signed)
Labs added on through [facility].

## 2018-01-20 NOTE — Telephone Encounter (Signed)
Pt's daughter advised. She declines GI referral for scopes. Labs have been added on.

## 2018-01-20 NOTE — Telephone Encounter (Signed)
-----   Message from [name], MD sent at 01/20/2018  9:47 AM EDT ----- Cholesterol is in a great range.  Good cholesterol, HDL, is lower than we like to see it.  Exercise can increase this.  Negative hepatitis C screening  Blood counts show significant anemia.  Hemoglobin is dropped from 12.2-9.4 within the last 6 weeks.  Has the patient noticed any bleeding in her urine, stool, vaginally or otherwise?  I would like [place] to add on iron panel, B12, folate.  Patient and her daughter discussed in the last time that she does not want a lot of interventions.  I wonder if she would consider seeing gastroenterology for possible scopes to determine cause of the bleeding if this was necessary.  [name], MD, MPH [facility] 01/20/2018 9:47 AM

## 2018-01-21 ENCOUNTER — Telehealth: Payer: Self-pay

## 2018-01-21 DIAGNOSIS — D649 Anemia, unspecified: Secondary | ICD-10-CM

## 2018-01-21 LAB — SPECIMEN STATUS REPORT

## 2018-01-21 LAB — IRON AND TIBC
IRON: 58 ug/dL (ref 27–139)
Iron Saturation: 19 % (ref 15–55)
Total Iron Binding Capacity: 303 ug/dL (ref 250–450)
UIBC: 245 ug/dL (ref 118–369)

## 2018-01-21 LAB — B12 AND FOLATE PANEL
Folate: 13.8 ng/mL (ref >3.0)
Vitamin B-12: 514 pg/mL (ref 232–1245)

## 2018-01-21 LAB — FERRITIN: FERRITIN: 203 ng/mL — AB (ref 15–150)

## 2018-01-21 NOTE — Telephone Encounter (Signed)
Pt's daughter advised. Agrees to hematology referral.

## 2018-01-21 NOTE — Telephone Encounter (Signed)
-----   Message from [name], MD sent at 01/21/2018  8:19 AM EDT ----- Normal iron, B12, and folate levels.  These are not the cause of the anemia.  I recommend that the patient see a hematologist for further work-up for her anemia.  Let me know if she agrees and I will place the referral.  Bacigalupo, Dionne Bucy, MD, MPH [facility] 01/21/2018 8:19 AM

## 2018-01-21 NOTE — Telephone Encounter (Signed)
Referral placed.  [name], MD, MPH [facility] 01/21/2018 12:01 PM

## 2018-01-27 ENCOUNTER — Telehealth: Payer: Self-pay | Admitting: Family Medicine

## 2018-01-27 NOTE — Telephone Encounter (Signed)
Pt's daughter is calling saying her mom is having very low energy level.  She wants to continue on the steroids, low dose to help her feel better  She uses [facility]  Thanks Con Memos

## 2018-01-27 NOTE — Telephone Encounter (Signed)
The steroids should only be used when she is as being an exacerbation of her asthma.  This should not be used to help with her energy level.  Given her multiple chronic illnesses and her age, it is not unexpected for her to have a low energy level.  [name], MD, MPH [facility] 01/27/2018 11:11 AM

## 2018-01-27 NOTE — Telephone Encounter (Signed)
They can discuss that with her pulmonologist.  I do not think that she needs this and it will worsen her diabetes.  [name], MD, MPH [facility] 01/27/2018 1:35 PM

## 2018-01-27 NOTE — Telephone Encounter (Signed)
Patient's daughter advised. She states they have a family friend that is a physician, who visited the family this weekend. That physician suggested a low dose of steroids to use as a maintenance medication for SOB on exertion due to asthma, and low energy levels. Please advise.

## 2018-01-27 NOTE — Telephone Encounter (Signed)
Patient's daughter advised.  

## 2018-01-28 ENCOUNTER — Telehealth: Payer: Self-pay | Admitting: Internal Medicine

## 2018-01-28 MED ORDER — PREDNISONE 10 MG PO TABS: 20.0000 mg | ORAL_TABLET | Freq: Every day | ORAL | 0 refills | Status: AC

## 2018-01-28 NOTE — Telephone Encounter (Signed)
Patient daughter calling stating that PCP told them to call us for patient is having breathing issues again. Would like to know about maybe starting steroids again.  Last time she was on that, it was only 20 mg steroids a day   She would like [place] to use the [facility] on [address] if we are able to send this in Please advise

## 2018-01-28 NOTE — Telephone Encounter (Signed)
Prednisone sent to pharmacy. Called and left message for pts daughter. In order to proceed with Nucala patient will need to sign authorizations. Nothing further needed at this time.

## 2018-01-28 NOTE — Telephone Encounter (Signed)
Can do prednisone 20 mg daily for 5 days. Need to pre-auth her for nucala, or other injectable, whichever is covered by her insurance.

## 2018-01-29 ENCOUNTER — Other Ambulatory Visit: Payer: Self-pay | Admitting: Internal Medicine

## 2018-01-29 MED ORDER — LEVALBUTEROL HCL 1.25 MG/3ML IN NEBU: 1.2500 mg | INHALATION_SOLUTION | Freq: Four times a day (QID) | RESPIRATORY_TRACT | 5 refills | Status: DC | PRN

## 2018-01-29 NOTE — Telephone Encounter (Signed)
LMTCB x 1 

## 2018-01-29 NOTE — Telephone Encounter (Signed)
Patient daughter returning our call ° °Please call back ° °

## 2018-01-29 NOTE — Telephone Encounter (Signed)
[facility] asking for refill of Xopenex. This medication was on patient list but since pcp visit it was removed.

## 2018-01-29 NOTE — Telephone Encounter (Signed)
Patient daughter returning call  °

## 2018-01-29 NOTE — Telephone Encounter (Signed)
Pts daughter will come by office next week to sign consent to start Nucala process. Form will be placed at front desk. Nothing further further needed.

## 2018-01-30 ENCOUNTER — Inpatient Hospital Stay: Payer: Medicaid Other | Admitting: Oncology

## 2018-02-04 ENCOUNTER — Encounter: Payer: Self-pay | Admitting: Oncology

## 2018-02-04 ENCOUNTER — Inpatient Hospital Stay: Payer: Medicaid Other

## 2018-02-04 ENCOUNTER — Inpatient Hospital Stay: Payer: Medicaid Other | Attending: Oncology | Admitting: Oncology

## 2018-02-04 VITALS — BP 107/86 | HR 96 | Temp 97.8°F | Resp 18 | Ht <= 58 in | Wt 168.5 lb

## 2018-02-04 DIAGNOSIS — E785 Hyperlipidemia, unspecified: Secondary | ICD-10-CM

## 2018-02-04 DIAGNOSIS — I517 Cardiomegaly: Secondary | ICD-10-CM | POA: Insufficient documentation

## 2018-02-04 DIAGNOSIS — Z8611 Personal history of tuberculosis: Secondary | ICD-10-CM | POA: Insufficient documentation

## 2018-02-04 DIAGNOSIS — Z7984 Long term (current) use of oral hypoglycemic drugs: Secondary | ICD-10-CM | POA: Insufficient documentation

## 2018-02-04 DIAGNOSIS — R0602 Shortness of breath: Secondary | ICD-10-CM | POA: Diagnosis not present

## 2018-02-04 DIAGNOSIS — E1165 Type 2 diabetes mellitus with hyperglycemia: Secondary | ICD-10-CM | POA: Diagnosis not present

## 2018-02-04 DIAGNOSIS — E871 Hypo-osmolality and hyponatremia: Secondary | ICD-10-CM

## 2018-02-04 DIAGNOSIS — K219 Gastro-esophageal reflux disease without esophagitis: Secondary | ICD-10-CM

## 2018-02-04 DIAGNOSIS — I10 Essential (primary) hypertension: Secondary | ICD-10-CM | POA: Diagnosis not present

## 2018-02-04 DIAGNOSIS — J45909 Unspecified asthma, uncomplicated: Secondary | ICD-10-CM | POA: Diagnosis not present

## 2018-02-04 DIAGNOSIS — R5383 Other fatigue: Secondary | ICD-10-CM | POA: Insufficient documentation

## 2018-02-04 DIAGNOSIS — Z79899 Other long term (current) drug therapy: Secondary | ICD-10-CM | POA: Diagnosis not present

## 2018-02-04 DIAGNOSIS — D649 Anemia, unspecified: Secondary | ICD-10-CM | POA: Insufficient documentation

## 2018-02-04 NOTE — Progress Notes (Signed)
Hematology/Oncology Consult note [facility] Telephone:([phone] [phone]  Patient Care Team: [name], MD as PCP - General (Family Medicine)   Name of the patient: Whitney Fleming  [id number]  [birth year]    Reason for referral- anemia   Referring physician- Dr. Brita Romp  Date of visit: 02/04/18   History of presenting illness-patient is a 72 year old female with a past medical history significant for hypertension, hyperlipidemia diabetes and asthma.  She has been referred to [place] for evaluation of anemia.  Patient's baseline hemoglobin has been around 11-12 this year.  However the most recent hemoglobin on 01/17/2018 was 9.4/28.1.  White count was mildly elevated at 11.1 and platelets were normal at 410.  Of note she has had mild microcytosis and her MCV ranges between 78-81.  Recent B12 and folate were within normal limits.  Patient has had intermittent hyperkalemia in the past.  Iron studies from August 2019 showed iron saturation of 90%.  Serum iron was normal at 58 and TIBC normal at 303.  Last hemoglobin A1c in April 2019 was elevated at 8.1.  Patient is here with her daughter-in-law today and mainly speaks [place] and Hindi.  I was able to converse with her in Hindi and obtain further history.  Patient reports feeling fatigued and does get short of breath when she walks from one room to the other.  Her main issue has been asthma for which she has been on intermittent steroids which is also been creating issues with her blood sugar control.  Patient feels better when she is on steroids and when she comes off it she starts feeling short of breath again.  She ideally wants to remain on continuous steroids  ECOG PS- 2  Pain scale- 0   Review of systems- Review of Systems  Constitutional: Positive for malaise/fatigue. Negative for chills, fever and weight loss.  HENT: Negative for congestion, ear discharge and nosebleeds.   Eyes: Negative  for blurred vision.  Respiratory: Positive for shortness of breath. Negative for cough, hemoptysis, sputum production and wheezing.   Cardiovascular: Negative for chest pain, palpitations, orthopnea and claudication.  Gastrointestinal: Negative for abdominal pain, blood in stool, constipation, diarrhea, heartburn, melena, nausea and vomiting.  Genitourinary: Negative for dysuria, flank pain, frequency, hematuria and urgency.  Musculoskeletal: Negative for back pain, joint pain and myalgias.  Skin: Negative for rash.  Neurological: Negative for dizziness, tingling, focal weakness, seizures, weakness and headaches.  Endo/Heme/Allergies: Does not bruise/bleed easily.  Psychiatric/Behavioral: Negative for depression and suicidal ideas. The patient does not have insomnia.     No Known Allergies  Patient Active Problem List   Diagnosis Date Noted  . Hyperlipidemia 12/20/2017  . GERD (gastroesophageal reflux disease) 12/20/2017  . Asthma exacerbation 09/26/2017  . Acute respiratory failure with hypoxia ([place]) 09/26/2017  . HTN (hypertension) 09/26/2017  . Diabetes ([place]) 09/26/2017     Past Medical History:  Diagnosis Date  . Asthma   . Cardiomegaly   . Diabetes mellitus without complication ([place])   . Hypertension   . Hyponatremia   . TB (tuberculosis)      Past Surgical History:  Procedure Laterality Date  . TUBAL LIGATION      Social History   Socioeconomic History  . Marital status: Married    Spouse name: Not on file  . Number of children: 4  . Years of education: Not on file  . Highest education level: Not on file  Occupational History  . Occupation: homemaker  Social  Needs  . Financial resource strain: Not on file  . Food insecurity:    Worry: Not on file    Inability: Not on file  . Transportation needs:    Medical: Not on file    Non-medical: Not on file  Tobacco Use  . Smoking status: Never Smoker  . Smokeless tobacco: Never Used  Substance and Sexual  Activity  . Alcohol use: Never    Frequency: Never  . Drug use: Never  . Sexual activity: Not Currently  Lifestyle  . Physical activity:    Days per week: Not on file    Minutes per session: Not on file  . Stress: Not on file  Relationships  . Social connections:    Talks on phone: Not on file    Gets together: Not on file    Attends religious service: Not on file    Active member of club or organization: Not on file    Attends meetings of clubs or organizations: Not on file    Relationship status: Not on file  . Intimate partner violence:    Fear of current or ex partner: Not on file    Emotionally abused: Not on file    Physically abused: Not on file    Forced sexual activity: Not on file  Other Topics Concern  . Not on file  Social History Narrative  . Not on file     Family History  Problem Relation Age of Onset  . Tuberculosis Other   . Healthy Mother   . Heart disease Father   . Asthma Father   . Cancer Neg Hx      Current Outpatient Medications:  .  AMBULATORY NON FORMULARY MEDICATION, Medication Name: Aero chamber use as directed. 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  budesonide-formoterol (SYMBICORT) 160-4.5 MCG/ACT inhaler, Inhale 2 puffs into the lungs 2 (two) times daily., Disp: 1 Inhaler, Rfl: 6 .  carvedilol (COREG) 12.5 MG tablet, Take 1 tablet (12.5 mg total) by mouth 2 (two) times daily with a meal., Disp: 60 tablet, Rfl: 3 .  empagliflozin (JARDIANCE) 25 MG TABS tablet, Take 25 mg by mouth daily., Disp: 30 tablet, Rfl: 2 .  levalbuterol (XOPENEX) 1.25 MG/3ML nebulizer solution, Take 1.25 mg by nebulization every 6 (six) hours as needed for wheezing. DX:J44.9, Disp: 216 mL, Rfl: 5 .  losartan (COZAAR) 50 MG tablet, Take 1 tablet (50 mg total) by mouth daily., Disp: 90 tablet, Rfl: 3 .  metFORMIN (GLUCOPHAGE) 1000 MG tablet, Take 1 tablet (1,000 mg total) by mouth 2 (two) times daily with a meal., Disp: 180 tablet, Rfl: 3 .  montelukast (SINGULAIR) 10 MG  tablet, Take 1 tablet (10 mg total) by mouth at bedtime., Disp: 30 tablet, Rfl: 3 .  PRESCRIPTION MEDICATION, **Non-US market drug** Zyrova C (rosuvastatin/clopidigrel) Take 1 capsule by mouth every evening, Disp: , Rfl:    Physical exam:  Vitals:   02/04/18 1129  BP: 107/86  Pulse: 96  Resp: 18  Temp: 97.8 F (36.6 C)  TempSrc: Tympanic  SpO2: 98%  Weight: 168 lb 8 oz (76.4 kg)  Height: '4\' 10"'$  (1.473 m)   Physical Exam  Constitutional: She is oriented to person, place, and time. She appears well-developed and well-nourished.  She is sitting in a wheelchair.  Appears in no acute distress  HENT:  Head: Normocephalic and atraumatic.  Eyes: Pupils are equal, round, and reactive to light. EOM are normal.  Neck: Normal range of motion.  Cardiovascular: Normal  rate, regular rhythm and normal heart sounds.  Pulmonary/Chest: Effort normal and breath sounds normal.  Abdominal: Soft. Bowel sounds are normal. She exhibits no distension. There is no tenderness.  No palpable splenomegaly  Lymphadenopathy:  No palpable cervical, supraclavicular, axillary or inguinal adenopathy   Neurological: She is alert and oriented to person, place, and time.  Skin: Skin is warm and dry.       CMP Latest Ref Rng & Units 12/26/2017  Glucose 65 - 99 mg/dL 185(H)  BUN 8 - 27 mg/dL 19  Creatinine 0.57 - 1.00 mg/dL 1.04(H)  Sodium 134 - 144 mmol/L 128(L)  Potassium 3.5 - 5.2 mmol/L 5.6(H)  Chloride 96 - 106 mmol/L 89(L)  CO2 20 - 29 mmol/L 16(L)  Calcium 8.7 - 10.3 mg/dL 8.7  Total Protein 6.5 - 8.1 g/dL -  Total Bilirubin 0.3 - 1.2 mg/dL -  Alkaline Phos 38 - 126 U/L -  AST 15 - 41 U/L -  ALT 0 - 44 U/L -   CBC Latest Ref Rng & Units 01/17/2018  WBC 3.4 - 10.8 x10E3/uL 11.1(H)  Hemoglobin 11.1 - 15.9 g/dL 9.4(L)  Hematocrit 34.0 - 46.6 % 28.1(L)  Platelets 150 - 450 x10E3/uL 410    Assessment and plan- Patient is a 72 y.o. female referred for normocytic anemia  Patient has had mild  microcytosis in the past but her recent MCV was 81.  Iron studies B12 and folate was within normal limits.  Renal functions have been normal recently.  Today I will check a CBC with differential, technologist smear review, ferritin, myeloma panel, serum free light chains, TSH, reticulocyte count, haptoglobin and ESR.  Her anemia could be due to chronic disease from her underlying comorbidities.  Her last hemoglobin A1c was also suggestive of uncontrolled diabetes.  Patient does not desire invasive interventions at this time.  Therefore unless her anemia is severe I will hold off on bone marrow biopsy.   I will see her back in 2 weeks time to discuss the results of her blood work and further management  Thank you for this kind referral and the opportunity to participate in the care of this patient   Visit Diagnosis 1. Normocytic anemia     Dr. Randa Evens, MD, MPH [facility] [phone] 02/04/2018  1:08 PM

## 2018-02-04 NOTE — Progress Notes (Signed)
The patient has agreed to use her daugther to interpret for her. I had the daughter to sign a waiver form today ( which her daughter has agreed to sign) The patient is stable and alert with visit today.

## 2018-02-05 ENCOUNTER — Inpatient Hospital Stay: Payer: Medicaid Other

## 2018-02-05 ENCOUNTER — Other Ambulatory Visit: Payer: Self-pay

## 2018-02-05 DIAGNOSIS — D649 Anemia, unspecified: Secondary | ICD-10-CM

## 2018-02-05 LAB — TECHNOLOGIST SMEAR REVIEW

## 2018-02-05 LAB — RETICULOCYTES
RBC.: 4.12 MIL/uL (ref 3.80–5.20)
RETIC COUNT ABSOLUTE: 98.9 10*3/uL (ref 19.0–183.0)
RETIC CT PCT: 2.4 % (ref 0.4–3.1)

## 2018-02-05 LAB — COMPREHENSIVE METABOLIC PANEL
ALBUMIN: 3.6 g/dL (ref 3.5–5.0)
ALT: 17 U/L (ref 0–44)
ANION GAP: 10 (ref 5–15)
AST: 21 U/L (ref 15–41)
Alkaline Phosphatase: 69 U/L (ref 38–126)
BUN: 24 mg/dL — ABNORMAL HIGH (ref 8–23)
CHLORIDE: 95 mmol/L — AB (ref 98–111)
CO2: 19 mmol/L — ABNORMAL LOW (ref 22–32)
Calcium: 8.6 mg/dL — ABNORMAL LOW (ref 8.9–10.3)
Creatinine, Ser: 1.17 mg/dL — ABNORMAL HIGH (ref 0.44–1.00)
GFR calc non Af Amer: 45 mL/min — ABNORMAL LOW (ref >60)
GFR, EST AFRICAN AMERICAN: 52 mL/min — AB (ref >60)
GLUCOSE: 410 mg/dL — AB (ref 70–99)
Potassium: 5 mmol/L (ref 3.5–5.1)
SODIUM: 124 mmol/L — AB (ref 135–145)
Total Bilirubin: 0.8 mg/dL (ref 0.3–1.2)
Total Protein: 6.5 g/dL (ref 6.5–8.1)

## 2018-02-05 LAB — CBC WITH DIFFERENTIAL/PLATELET
Basophils Absolute: 0.1 10*3/uL (ref 0–0.1)
Basophils Relative: 1 %
Eosinophils Absolute: 0.6 10*3/uL (ref 0–0.7)
Eosinophils Relative: 6 %
HEMATOCRIT: 34.1 % — AB (ref 35.0–47.0)
HEMOGLOBIN: 11.5 g/dL — AB (ref 12.0–16.0)
LYMPHS ABS: 2.4 10*3/uL (ref 1.0–3.6)
Lymphocytes Relative: 21 %
MCH: 27.8 pg (ref 26.0–34.0)
MCHC: 33.6 g/dL (ref 32.0–36.0)
MCV: 82.8 fL (ref 80.0–100.0)
MONOS PCT: 6 %
Monocytes Absolute: 0.7 10*3/uL (ref 0.2–0.9)
NEUTROS ABS: 7.7 10*3/uL — AB (ref 1.4–6.5)
NEUTROS PCT: 66 %
Platelets: 185 10*3/uL (ref 150–440)
RBC: 4.12 MIL/uL (ref 3.80–5.20)
RDW: 15.5 % — ABNORMAL HIGH (ref 11.5–14.5)
WBC: 11.5 10*3/uL — ABNORMAL HIGH (ref 3.6–11.0)

## 2018-02-05 LAB — FERRITIN: Ferritin: 80 ng/mL (ref 11–307)

## 2018-02-05 LAB — SEDIMENTATION RATE: SED RATE: 36 mm/h — AB (ref 0–30)

## 2018-02-05 LAB — TSH: TSH: 1.959 u[IU]/mL (ref 0.350–4.500)

## 2018-02-06 ENCOUNTER — Telehealth: Payer: Self-pay

## 2018-02-06 ENCOUNTER — Telehealth: Payer: Self-pay | Admitting: *Deleted

## 2018-02-06 LAB — MULTIPLE MYELOMA PANEL, SERUM
ALBUMIN/GLOB SERPL: 1.3 (ref 0.7–1.7)
ALPHA 1: 0.2 g/dL (ref 0.0–0.4)
Albumin SerPl Elph-Mcnc: 3.2 g/dL (ref 2.9–4.4)
Alpha2 Glob SerPl Elph-Mcnc: 0.9 g/dL (ref 0.4–1.0)
B-Globulin SerPl Elph-Mcnc: 0.8 g/dL (ref 0.7–1.3)
Gamma Glob SerPl Elph-Mcnc: 0.7 g/dL (ref 0.4–1.8)
Globulin, Total: 2.6 g/dL (ref 2.2–3.9)
IgA: 162 mg/dL (ref 64–422)
IgG (Immunoglobin G), Serum: 732 mg/dL (ref 700–1600)
IgM (Immunoglobulin M), Srm: 111 mg/dL (ref 26–217)
M Protein SerPl Elph-Mcnc: 0.3 g/dL — ABNORMAL HIGH
Total Protein ELP: 5.8 g/dL — ABNORMAL LOW (ref 6.0–8.5)

## 2018-02-06 LAB — HAPTOGLOBIN: Haptoglobin: 233 mg/dL — ABNORMAL HIGH (ref 34–200)

## 2018-02-06 LAB — KAPPA/LAMBDA LIGHT CHAINS
KAPPA, LAMDA LIGHT CHAIN RATIO: 1.12 (ref 0.26–1.65)
Kappa free light chain: 30.9 mg/L — ABNORMAL HIGH (ref 3.3–19.4)
LAMDA FREE LIGHT CHAINS: 27.5 mg/L — AB (ref 5.7–26.3)

## 2018-02-06 NOTE — Telephone Encounter (Signed)
Called daughter to let her know that sugar was elevated. She states she just got over steroids last one was Sunday. She is not on insulin and her blood sugar usally below 200. I told her that Dr. Janese Banks wants me to contact PCP and daughter is fine with that and if the PCP wants to make earlier appt she can and daughter will take her. I will send inbasket to Dr. Jacinto Reap.

## 2018-02-06 NOTE — Telephone Encounter (Signed)
-----   Message from Sindy Guadeloupe, MD sent at 02/06/2018  8:27 AM EDT ----- Please inform patient and pcp about high blood sugar. Hyponatremia likely secondary to hyperglycemia. Thanks, Astrid Divine

## 2018-02-06 NOTE — Telephone Encounter (Signed)
I left message on the patient voice mail To inform the patient that her blood sugars has been running high and i have also inform her PCP with her labs results.

## 2018-02-06 NOTE — Telephone Encounter (Signed)
-----   Message from Whitney Guadeloupe, MD sent at 02/06/2018  8:27 AM EDT ----- Please inform patient and pcp about high blood sugar. Hyponatremia likely secondary to hyperglycemia. Thanks, Astrid Divine

## 2018-02-07 ENCOUNTER — Telehealth: Payer: Self-pay | Admitting: Internal Medicine

## 2018-02-07 ENCOUNTER — Telehealth: Payer: Self-pay | Admitting: Family Medicine

## 2018-02-07 NOTE — Telephone Encounter (Signed)
LMTCB

## 2018-02-07 NOTE — Telephone Encounter (Signed)
Patient wants to know if patient should start insuline due to high BS readings.  [facility] [address] [phone]

## 2018-02-07 NOTE — Telephone Encounter (Signed)
Pt's daughter wanted to speak with someone regarding her mother high blood sugar.  States the specialist referred her back to our office.

## 2018-02-07 NOTE — Telephone Encounter (Signed)
Recommend that they start checking fasting glucose daily and once 2 hours post-prandial.  Would like to see patient early next week to follow-up.  Patient has not used insulin, so it doesn't seem right to start this now as blood sugar is likely elevated due to steroid use.  If if running >400 consistently, call after hours line this weekend.  [name], MD, MPH [facility] 02/07/2018 2:22 PM

## 2018-02-07 NOTE — Telephone Encounter (Signed)
Patient's daughter advised. She verbalized understanding. Follow up scheduled. Appointment offered for Tuesday. Daughter unable to bring her due to another appointment. Follow up scheduled Wednesday.

## 2018-02-07 NOTE — Telephone Encounter (Signed)
Left message to call back. pts pcp has already addressed necessary steps to take for elevated bs. Dr. Ashby Dawes gave prednisone 20 mg x 5 day on 8/20. Pt should have completed.

## 2018-02-07 NOTE — Telephone Encounter (Signed)
Pt daughter states pt blood sugar is elevated after taking the predisone, please call to discuss.

## 2018-02-07 NOTE — Telephone Encounter (Signed)
Pt daughter states her sugar is over 400

## 2018-02-07 NOTE — Telephone Encounter (Signed)
Patient calling that her mother glucose levels this morning was >400 and after eating was 500. Reports that patient doesn't have any energy.

## 2018-02-10 ENCOUNTER — Encounter: Payer: Self-pay | Admitting: Emergency Medicine

## 2018-02-10 ENCOUNTER — Emergency Department: Payer: Medicaid Other

## 2018-02-10 ENCOUNTER — Observation Stay
Admission: EM | Admit: 2018-02-10 | Discharge: 2018-02-12 | Disposition: A | Discharge status: Home / Self Care | Payer: Medicaid Other | Attending: Internal Medicine | Admitting: Internal Medicine

## 2018-02-10 ENCOUNTER — Other Ambulatory Visit: Payer: Self-pay

## 2018-02-10 DIAGNOSIS — J441 Chronic obstructive pulmonary disease with (acute) exacerbation: Secondary | ICD-10-CM | POA: Diagnosis not present

## 2018-02-10 DIAGNOSIS — E871 Hypo-osmolality and hyponatremia: Secondary | ICD-10-CM | POA: Insufficient documentation

## 2018-02-10 DIAGNOSIS — N183 Chronic kidney disease, stage 3 (moderate): Secondary | ICD-10-CM | POA: Diagnosis not present

## 2018-02-10 DIAGNOSIS — Z7984 Long term (current) use of oral hypoglycemic drugs: Secondary | ICD-10-CM | POA: Insufficient documentation

## 2018-02-10 DIAGNOSIS — Z79899 Other long term (current) drug therapy: Secondary | ICD-10-CM | POA: Diagnosis not present

## 2018-02-10 DIAGNOSIS — E1165 Type 2 diabetes mellitus with hyperglycemia: Secondary | ICD-10-CM | POA: Insufficient documentation

## 2018-02-10 DIAGNOSIS — J9601 Acute respiratory failure with hypoxia: Secondary | ICD-10-CM | POA: Diagnosis not present

## 2018-02-10 DIAGNOSIS — J209 Acute bronchitis, unspecified: Secondary | ICD-10-CM | POA: Insufficient documentation

## 2018-02-10 DIAGNOSIS — E1122 Type 2 diabetes mellitus with diabetic chronic kidney disease: Secondary | ICD-10-CM | POA: Insufficient documentation

## 2018-02-10 DIAGNOSIS — J9811 Atelectasis: Secondary | ICD-10-CM | POA: Diagnosis not present

## 2018-02-10 DIAGNOSIS — J440 Chronic obstructive pulmonary disease with acute lower respiratory infection: Secondary | ICD-10-CM | POA: Diagnosis not present

## 2018-02-10 DIAGNOSIS — R0602 Shortness of breath: Secondary | ICD-10-CM | POA: Diagnosis not present

## 2018-02-10 DIAGNOSIS — D631 Anemia in chronic kidney disease: Secondary | ICD-10-CM | POA: Diagnosis not present

## 2018-02-10 DIAGNOSIS — E785 Hyperlipidemia, unspecified: Secondary | ICD-10-CM | POA: Insufficient documentation

## 2018-02-10 DIAGNOSIS — R079 Chest pain, unspecified: Secondary | ICD-10-CM | POA: Diagnosis not present

## 2018-02-10 DIAGNOSIS — I129 Hypertensive chronic kidney disease with stage 1 through stage 4 chronic kidney disease, or unspecified chronic kidney disease: Secondary | ICD-10-CM | POA: Insufficient documentation

## 2018-02-10 MED ORDER — IPRATROPIUM-ALBUTEROL 0.5-2.5 (3) MG/3ML IN SOLN
3.0000 mL | Freq: Once | RESPIRATORY_TRACT | Status: AC
  Administered 2018-02-10: 3 mL via RESPIRATORY_TRACT

## 2018-02-10 MED ORDER — METHYLPREDNISOLONE SODIUM SUCC 125 MG IJ SOLR
125.0000 mg | Freq: Once | INTRAMUSCULAR | Status: AC
  Administered 2018-02-11: 125 mg via INTRAVENOUS
  Filled 2018-02-10: qty 2

## 2018-02-10 MED ORDER — IPRATROPIUM-ALBUTEROL 0.5-2.5 (3) MG/3ML IN SOLN
RESPIRATORY_TRACT | Status: AC
  Filled 2018-02-10: qty 9

## 2018-02-10 NOTE — ED Provider Notes (Signed)
[facility] Emergency Department Provider Note    First MD Initiated Contact with Patient 02/10/18 2347     (approximate)  I have reviewed the triage vital signs and the nursing notes.   HISTORY  Chief Complaint Shortness of Breath    HPI Whitney Fleming is a 72 y.o. female with history of COPD presents to the emergency department with acute onset of dyspnea wheezing and productive cough (yellow sputum) which began tonight.  Patient denies any chest pain.  Patient denies any lower extremity pain or swelling.  Patient denies any fever.   Past Medical History:  Diagnosis Date  . Asthma   . Cardiomegaly   . Diabetes mellitus without complication ([place])   . Hypertension   . Hyponatremia     Patient Active Problem List   Diagnosis Date Noted  . Acute hypoxemic respiratory failure ([place]) 02/11/2018  . Hyperlipidemia 12/20/2017  . GERD (gastroesophageal reflux disease) 12/20/2017  . Asthma exacerbation 09/26/2017  . Acute respiratory failure with hypoxia ([place]) 09/26/2017  . HTN (hypertension) 09/26/2017  . Diabetes ([place]) 09/26/2017    Past Surgical History:  Procedure Laterality Date  . TUBAL LIGATION      Prior to Admission medications   Medication Sig Start Date End Date Taking? Authorizing Provider  AMBULATORY NON FORMULARY MEDICATION Medication Name: Aero chamber use as directed. QH:U76.546 12/24/17  Yes [name], MD  budesonide-formoterol (SYMBICORT) 160-4.5 MCG/ACT inhaler Inhale 2 puffs into the lungs 2 (two) times daily. 12/09/17  Yes [name], MD  carvedilol (COREG) 12.5 MG tablet Take 1 tablet (12.5 mg total) by mouth 2 (two) times daily with a meal. 12/19/17  Yes Bacigalupo, Dionne Bucy, MD  empagliflozin (JARDIANCE) 25 MG TABS tablet Take 25 mg by mouth daily. 12/19/17  Yes Bacigalupo, Dionne Bucy, MD  losartan (COZAAR) 50 MG tablet Take 1 tablet (50 mg total) by mouth daily. 12/26/17  Yes Bacigalupo, Dionne Bucy, MD  metFORMIN  (GLUCOPHAGE) 1000 MG tablet Take 1 tablet (1,000 mg total) by mouth 2 (two) times daily with a meal. 12/19/17  Yes Bacigalupo, Dionne Bucy, MD  montelukast (SINGULAIR) 10 MG tablet Take 1 tablet (10 mg total) by mouth at bedtime. 12/24/17  Yes [name], MD  PRESCRIPTION MEDICATION **Non-US market drug** Zyrova C (rosuvastatin/clopidigrel) Take 1 capsule by mouth every evening   Yes [provider]  levalbuterol (XOPENEX) 1.25 MG/3ML nebulizer solution Take 1.25 mg by nebulization every 6 (six) hours as needed for wheezing. DX:J44.9 Patient not taking: Reported on 02/04/2018 01/29/18 01/29/19  [name], MD    Allergies No known drug allergies  Family History  Problem Relation Age of Onset  . Tuberculosis Other   . Healthy Mother   . Heart disease Father   . Asthma Father   . Cancer Neg Hx     Social History Social History   Tobacco Use  . Smoking status: Never Smoker  . Smokeless tobacco: Never Used  Substance Use Topics  . Alcohol use: Never    Frequency: Never  . Drug use: Never    Review of Systems Constitutional: No fever/chills Eyes: No visual changes. ENT: No sore throat. Cardiovascular: Denies chest pain. Respiratory: Denies shortness of breath. Gastrointestinal: No abdominal pain.  No nausea, no vomiting.  No diarrhea.  No constipation. Genitourinary: Negative for dysuria. Musculoskeletal: Negative for neck pain.  Negative for back pain. Integumentary: Negative for rash. Neurological: Negative for headaches, focal weakness or numbness.   ____________________________________________   PHYSICAL EXAM:  VITAL  SIGNS: ED Triage Vitals  Enc Vitals Group     BP 02/10/18 2337 (!) 147/55     Pulse Rate 02/10/18 2337 93     Resp 02/10/18 2337 (!) 24     Temp --      Temp Source 02/10/18 2337 Oral     SpO2 02/10/18 2337 98 %     Weight 02/10/18 2336 76.4 kg (168 lb 6.9 oz)     Height 02/10/18 2336 1.473 m (4\' 10" )     Head  Circumference --      Peak Flow --      Pain Score 02/10/18 2336 0     Pain Loc --      Pain Edu? --      Excl. in [place]? --     Constitutional: Alert and oriented.  Apparent respiratory distress. Eyes: Conjunctivae are normal. Head: Atraumatic. Mouth/Throat: Mucous membranes are moist. Neck: No stridor.   Cardiovascular: Normal rate, regular rhythm. Good peripheral circulation. Grossly normal heart sounds. Respiratory: Tachypnea, diffuse expiratory wheezing, positive accessory respiratory muscle use  gastrointestinal: Soft and nontender. No distention.  Musculoskeletal: No lower extremity tenderness nor edema. No gross deformities of extremities. Neurologic:  Normal speech and language. No gross focal neurologic deficits are appreciated.  Skin:  Skin is warm, dry and intact. No rash noted. Psychiatric: Mood and affect are normal. Speech and behavior are normal.  ____________________________________________   LABS (all labs ordered are listed, but only abnormal results are displayed)  Labs Reviewed  CBC WITH DIFFERENTIAL/PLATELET - Abnormal; Notable for the following components:      Result Value   WBC 11.7 (*)    Hemoglobin 11.8 (*)    RDW 15.8 (*)    Neutro Abs 6.6 (*)    Monocytes Absolute 1.2 (*)    Eosinophils Absolute 0.8 (*)    All other components within normal limits  BASIC METABOLIC PANEL - Abnormal; Notable for the following components:   Sodium 132 (*)    Glucose, Bld 267 (*)    Creatinine, Ser 1.02 (*)    Calcium 8.7 (*)    GFR calc non Af Amer 53 (*)    All other components within normal limits  BRAIN NATRIURETIC PEPTIDE  TROPONIN I  BLOOD GAS, VENOUS   ____________________________________________  EKG  ED ECG REPORT I, [place] N Brenner Visconti, the attending physician, personally viewed and interpreted this ECG.   Date: 02/11/2018  EKG Time: 12:11 AM  Rate: 98  Rhythm: Normal sinus rhythm  Axis: Normal   Intervals: Normal  ST&T Change:  None   RADIOLOGY I, [place] N Sabas Frett, personally viewed and evaluated these images (plain radiographs) as part of my medical decision making, as well as reviewing the written report by the radiologist.  ED MD interpretation: Bronchitic changes noted on chest x-ray.  No pulmonary emboli noted on CT scan of the chest.  Official radiology report(s): Ct Angio Chest Pe W And/or Wo Contrast  Result Date: 02/11/2018 CLINICAL DATA:  72 year old female with shortness of breath. Chest pain. EXAM: CT ANGIOGRAPHY CHEST WITH CONTRAST TECHNIQUE: Multidetector CT imaging of the chest was performed using the standard protocol during bolus administration of intravenous contrast. Multiplanar CT image reconstructions and MIPs were obtained to evaluate the vascular anatomy. CONTRAST:  16mL OMNIPAQUE IOHEXOL 350 MG/ML SOLN COMPARISON:  Chest radiograph dated 02/28/2018 and CT dated 12/23/2012 FINDINGS: Cardiovascular: There is no cardiomegaly or pericardial effusion. Coronary vascular calcification noted. There is moderate atherosclerotic calcification of the thoracic aorta. No  aneurysmal dilatation or dissection. The origins of the great vessels of the aortic arch are patent. Evaluation of the pulmonary arteries is limited due to respiratory motion artifact. No definite large or central pulmonary artery embolus identified. Nonocclusive apparent linear hypodensity in the periphery of a right lower lobe subsegmental pulmonary artery branch (series 5, image 139) is most likely artifactual. This is less likely related to an old infarct/scarring. Evaluation of this area is limited due to motion artifact. Mediastinum/Nodes: There is no hilar or mediastinal adenopathy. Esophagus and the thyroid gland are grossly unremarkable. No mediastinal fluid collection. Calcified granuloma noted in the mediastinum. Lungs/Pleura: There is bibasilar subpleural interstitial coarsening/fibrosis. No focal consolidation, pleural effusion, or  pneumothorax. The central airways are patent. Upper Abdomen: There is a small stone within the gallbladder. No pericholecystic fluid. The visualized upper abdomen is otherwise unremarkable. Musculoskeletal: Age indeterminate L1 compression fracture, likely old. Clinical correlation is recommended. No acute fracture. Review of the MIP images confirms the above findings. IMPRESSION: 1. No acute intrathoracic pathology. No CT evidence of central pulmonary artery embolus. 2. Bibasilar interstitial coarsening/fibrosis. 3.  Aortic Atherosclerosis (ICD10-I70.0). Electronically Signed   By: Anner Crete M.D.   On: 02/11/2018 01:08   Dg Chest Port 1 View  Result Date: 02/11/2018 CLINICAL DATA:  Shortness of breath, productive cough. History of COPD. EXAM: PORTABLE CHEST 1 VIEW COMPARISON:  Chest radiograph December 02, 2017 FINDINGS: Cardiomediastinal silhouette is normal. Calcified aortic arch. Bronchitic changes. Bibasilar strandy densities most apparent at costophrenic angles. No pleural effusions or focal consolidations. Trachea projects midline and there is no pneumothorax. Soft tissue planes and included osseous structures are non-suspicious. IMPRESSION: 1. Bronchitic changes without focal consolidation. Bibasilar atelectasis. 2. Aortic Atherosclerosis (ICD10-I70.0). Electronically Signed   By: Elon Alas M.D.   On: 02/11/2018 00:14    ____________________________________________   PROCEDURES  Critical Care performed:   CRITICAL CARE Performed by: Gregor Hams   Total critical care time: 30 minutes  Critical care time was exclusive of separately billable procedures and treating other patients.  Critical care was necessary to treat or prevent imminent or life-threatening deterioration.  Critical care was time spent personally by me on the following activities: development of treatment plan with patient and/or surrogate as well as nursing, discussions with consultants, evaluation of  patient's response to treatment, examination of patient, obtaining history from patient or surrogate, ordering and performing treatments and interventions, ordering and review of laboratory studies, ordering and review of radiographic studies, pulse oximetry and re-evaluation of patient's condition.  Procedures   ____________________________________________   INITIAL IMPRESSION / ASSESSMENT AND PLAN / ED COURSE  As part of my medical decision making, I reviewed the following data within the electronic MEDICAL RECORD NUMBER   72 year old female presented with above-stated history and physical exam secondary to respiratory distress with concern for possible COPD exacerbation versus pulmonary emboli versus potential cardiac etiology including congestive heart failure.  No evidence of right-sided heart failure noted on clinical exam.  Chest x-ray revealed no evidence of pulmonary edema or pneumonia CT scan revealed no evidence of pulmonary emboli suspect COPD exacerbation as the etiology for the patient's symptoms.  Patient given multiple DuoNeb's and Solu-Medrol in the emergency department however patient remains tachypneic at this time.  Patient continues to have expiratory wheezing.  As such patient discussed with Dr. Cristela Felt for hospital admission further evaluation and management. ____________________________________________  FINAL CLINICAL IMPRESSION(S) / ED DIAGNOSES  Final diagnoses:  Acute hypoxemic respiratory failure ([place])  COPD exacerbation (  [place])     MEDICATIONS GIVEN DURING THIS VISIT:  Medications  sodium chloride 0.9 % bolus 1,000 mL (has no administration in time range)  methylPREDNISolone sodium succinate (SOLU-MEDROL) 125 mg/2 mL injection 60 mg (has no administration in time range)    Followed by  predniSONE (DELTASONE) tablet 40 mg (has no administration in time range)  ipratropium-albuterol (DUONEB) 0.5-2.5 (3) MG/3ML nebulizer solution 3 mL (has no administration in time  range)  ipratropium-albuterol (DUONEB) 0.5-2.5 (3) MG/3ML nebulizer solution 3 mL (3 mLs Nebulization Given 02/10/18 2357)  ipratropium-albuterol (DUONEB) 0.5-2.5 (3) MG/3ML nebulizer solution 3 mL (3 mLs Nebulization Given 02/10/18 2358)  ipratropium-albuterol (DUONEB) 0.5-2.5 (3) MG/3ML nebulizer solution 3 mL (3 mLs Nebulization Given 02/10/18 2358)  methylPREDNISolone sodium succinate (SOLU-MEDROL) 125 mg/2 mL injection 125 mg (125 mg Intravenous Given 02/11/18 0000)  iohexol (OMNIPAQUE) 350 MG/ML injection 60 mL (60 mLs Intravenous Contrast Given 02/11/18 0038)  azithromycin (ZITHROMAX) tablet 500 mg (500 mg Oral Given 02/11/18 0110)  albuterol (PROVENTIL) (2.5 MG/3ML) 0.083% nebulizer solution (2.5 mg  Given 02/11/18 0057)     ED Discharge Orders    None       Note:  This document was prepared using Dragon voice recognition software and may include unintentional dictation errors.    Gregor Hams, MD 02/11/18 0400

## 2018-02-10 NOTE — ED Triage Notes (Signed)
Pt to triage via w/c with no distress noted; pt reports having SHOB tonight with prod cough yellow sputum, hx COPD; denies any recent illnes; denies pain

## 2018-02-11 ENCOUNTER — Emergency Department: Payer: Medicaid Other

## 2018-02-11 ENCOUNTER — Encounter: Payer: Self-pay | Admitting: Radiology

## 2018-02-11 ENCOUNTER — Other Ambulatory Visit: Payer: Self-pay

## 2018-02-11 DIAGNOSIS — E1165 Type 2 diabetes mellitus with hyperglycemia: Secondary | ICD-10-CM | POA: Diagnosis not present

## 2018-02-11 DIAGNOSIS — J441 Chronic obstructive pulmonary disease with (acute) exacerbation: Secondary | ICD-10-CM | POA: Diagnosis not present

## 2018-02-11 DIAGNOSIS — E871 Hypo-osmolality and hyponatremia: Secondary | ICD-10-CM | POA: Diagnosis not present

## 2018-02-11 DIAGNOSIS — J9601 Acute respiratory failure with hypoxia: Secondary | ICD-10-CM | POA: Diagnosis present

## 2018-02-11 DIAGNOSIS — J9811 Atelectasis: Secondary | ICD-10-CM | POA: Diagnosis not present

## 2018-02-11 DIAGNOSIS — R0602 Shortness of breath: Secondary | ICD-10-CM | POA: Diagnosis not present

## 2018-02-11 DIAGNOSIS — R079 Chest pain, unspecified: Secondary | ICD-10-CM | POA: Diagnosis not present

## 2018-02-11 LAB — CBC WITH DIFFERENTIAL/PLATELET
BASOS ABS: 0.1 10*3/uL (ref 0–0.1)
Basophils Relative: 1 %
EOS PCT: 7 %
Eosinophils Absolute: 0.8 10*3/uL — ABNORMAL HIGH (ref 0–0.7)
HCT: 36 % (ref 35.0–47.0)
HEMOGLOBIN: 11.8 g/dL — AB (ref 12.0–16.0)
LYMPHS ABS: 3 10*3/uL (ref 1.0–3.6)
Lymphocytes Relative: 26 %
MCH: 27.4 pg (ref 26.0–34.0)
MCHC: 32.9 g/dL (ref 32.0–36.0)
MCV: 83.4 fL (ref 80.0–100.0)
Monocytes Absolute: 1.2 10*3/uL — ABNORMAL HIGH (ref 0.2–0.9)
Monocytes Relative: 10 %
NEUTROS ABS: 6.6 10*3/uL — AB (ref 1.4–6.5)
Neutrophils Relative %: 56 %
PLATELETS: 261 10*3/uL (ref 150–440)
RBC: 4.32 MIL/uL (ref 3.80–5.20)
RDW: 15.8 % — ABNORMAL HIGH (ref 11.5–14.5)
WBC: 11.7 10*3/uL — AB (ref 3.6–11.0)

## 2018-02-11 LAB — GLUCOSE, CAPILLARY
GLUCOSE-CAPILLARY: 291 mg/dL — AB (ref 70–99)
GLUCOSE-CAPILLARY: 311 mg/dL — AB (ref 70–99)
GLUCOSE-CAPILLARY: 397 mg/dL — AB (ref 70–99)
Glucose-Capillary: 407 mg/dL — ABNORMAL HIGH (ref 70–99)
Glucose-Capillary: 336 mg/dL — ABNORMAL HIGH (ref 70–99)

## 2018-02-11 LAB — BASIC METABOLIC PANEL
Anion gap: 8 (ref 5–15)
BUN: 15 mg/dL (ref 8–23)
CHLORIDE: 102 mmol/L (ref 98–111)
CO2: 22 mmol/L (ref 22–32)
Calcium: 8.7 mg/dL — ABNORMAL LOW (ref 8.9–10.3)
Creatinine, Ser: 1.02 mg/dL — ABNORMAL HIGH (ref 0.44–1.00)
GFR, EST NON AFRICAN AMERICAN: 53 mL/min — AB (ref >60)
Glucose, Bld: 267 mg/dL — ABNORMAL HIGH (ref 70–99)
POTASSIUM: 5.1 mmol/L (ref 3.5–5.1)
SODIUM: 132 mmol/L — AB (ref 135–145)

## 2018-02-11 LAB — BRAIN NATRIURETIC PEPTIDE: B NATRIURETIC PEPTIDE 5: 91 pg/mL (ref 0.0–100.0)

## 2018-02-11 LAB — TROPONIN I

## 2018-02-11 MED ORDER — GUAIFENESIN-CODEINE 100-10 MG/5ML PO SOLN
5.0000 mL | ORAL | Status: DC | PRN
  Administered 2018-02-11 – 2018-02-12 (×3): 5 mL via ORAL
  Filled 2018-02-11 (×3): qty 5

## 2018-02-11 MED ORDER — LOSARTAN POTASSIUM 50 MG PO TABS
50.0000 mg | ORAL_TABLET | Freq: Every day | ORAL | Status: DC
  Administered 2018-02-11 – 2018-02-12 (×2): 50 mg via ORAL
  Filled 2018-02-11 (×2): qty 1

## 2018-02-11 MED ORDER — CANAGLIFLOZIN 100 MG PO TABS: 100.0000 mg | ORAL_TABLET | Freq: Every day | ORAL | Status: DC

## 2018-02-11 MED ORDER — CARVEDILOL 25 MG PO TABS
12.5000 mg | ORAL_TABLET | Freq: Two times a day (BID) | ORAL | Status: DC
  Administered 2018-02-11 – 2018-02-12 (×3): 12.5 mg via ORAL
  Filled 2018-02-11 (×3): qty 1

## 2018-02-11 MED ORDER — IPRATROPIUM-ALBUTEROL 0.5-2.5 (3) MG/3ML IN SOLN
3.0000 mL | RESPIRATORY_TRACT | Status: DC | PRN
  Administered 2018-02-12: 04:00:00 3 mL via RESPIRATORY_TRACT
  Filled 2018-02-11: qty 3

## 2018-02-11 MED ORDER — IOHEXOL 350 MG/ML SOLN
60.0000 mL | Freq: Once | INTRAVENOUS | Status: AC | PRN
  Administered 2018-02-11: 60 mL via INTRAVENOUS

## 2018-02-11 MED ORDER — ACETAMINOPHEN 650 MG RE SUPP: 650.0000 mg | Freq: Four times a day (QID) | RECTAL | Status: DC | PRN

## 2018-02-11 MED ORDER — CLOPIDOGREL BISULFATE 75 MG PO TABS
75.0000 mg | ORAL_TABLET | Freq: Every day | ORAL | Status: DC
  Administered 2018-02-11 – 2018-02-12 (×2): 75 mg via ORAL
  Filled 2018-02-11 (×2): qty 1

## 2018-02-11 MED ORDER — DOXYCYCLINE HYCLATE 100 MG PO TABS
100.0000 mg | ORAL_TABLET | Freq: Two times a day (BID) | ORAL | Status: DC
  Administered 2018-02-11 – 2018-02-12 (×3): 100 mg via ORAL
  Filled 2018-02-11 (×4): qty 1

## 2018-02-11 MED ORDER — MONTELUKAST SODIUM 10 MG PO TABS
10.0000 mg | ORAL_TABLET | Freq: Every day | ORAL | Status: DC
  Administered 2018-02-11: 10 mg via ORAL
  Filled 2018-02-11: qty 1

## 2018-02-11 MED ORDER — ACETAMINOPHEN 325 MG PO TABS: 650.0000 mg | ORAL_TABLET | Freq: Four times a day (QID) | ORAL | Status: DC | PRN

## 2018-02-11 MED ORDER — IPRATROPIUM-ALBUTEROL 0.5-2.5 (3) MG/3ML IN SOLN
3.0000 mL | RESPIRATORY_TRACT | Status: DC
  Administered 2018-02-11 (×2): 3 mL via RESPIRATORY_TRACT
  Filled 2018-02-11 (×2): qty 3

## 2018-02-11 MED ORDER — ALBUTEROL SULFATE (2.5 MG/3ML) 0.083% IN NEBU
INHALATION_SOLUTION | RESPIRATORY_TRACT | Status: AC
  Administered 2018-02-11: 2.5 mg
  Filled 2018-02-11: qty 3

## 2018-02-11 MED ORDER — SODIUM CHLORIDE 0.9 % IV BOLUS
1000.0000 mL | Freq: Once | INTRAVENOUS | Status: AC
  Administered 2018-02-11: 1000 mL via INTRAVENOUS

## 2018-02-11 MED ORDER — ONDANSETRON HCL 4 MG PO TABS: 4.0000 mg | ORAL_TABLET | Freq: Four times a day (QID) | ORAL | Status: DC | PRN

## 2018-02-11 MED ORDER — AZITHROMYCIN 500 MG PO TABS
500.0000 mg | ORAL_TABLET | Freq: Once | ORAL | Status: AC
  Administered 2018-02-11: 500 mg via ORAL
  Filled 2018-02-11: qty 1

## 2018-02-11 MED ORDER — INSULIN ASPART 100 UNIT/ML ~~LOC~~ SOLN
18.0000 [IU] | Freq: Once | SUBCUTANEOUS | Status: AC
  Administered 2018-02-11: 18 [IU] via SUBCUTANEOUS
  Filled 2018-02-11: qty 1

## 2018-02-11 MED ORDER — ROSUVASTATIN CALCIUM 10 MG PO TABS
10.0000 mg | ORAL_TABLET | Freq: Every day | ORAL | Status: DC
  Administered 2018-02-11 – 2018-02-12 (×2): 10 mg via ORAL
  Filled 2018-02-11 (×2): qty 1

## 2018-02-11 MED ORDER — IPRATROPIUM-ALBUTEROL 0.5-2.5 (3) MG/3ML IN SOLN
3.0000 mL | Freq: Three times a day (TID) | RESPIRATORY_TRACT | Status: DC
  Administered 2018-02-11 – 2018-02-12 (×3): 3 mL via RESPIRATORY_TRACT
  Filled 2018-02-11 (×3): qty 3

## 2018-02-11 MED ORDER — METHYLPREDNISOLONE SODIUM SUCC 125 MG IJ SOLR
60.0000 mg | Freq: Two times a day (BID) | INTRAMUSCULAR | Status: AC
  Administered 2018-02-11 (×2): 60 mg via INTRAVENOUS
  Filled 2018-02-11 (×2): qty 2

## 2018-02-11 MED ORDER — ENOXAPARIN SODIUM 40 MG/0.4ML ~~LOC~~ SOLN
40.0000 mg | SUBCUTANEOUS | Status: DC
  Administered 2018-02-11: 21:00:00 40 mg via SUBCUTANEOUS
  Filled 2018-02-11: qty 0.4

## 2018-02-11 MED ORDER — INSULIN ASPART 100 UNIT/ML ~~LOC~~ SOLN
0.0000 [IU] | Freq: Three times a day (TID) | SUBCUTANEOUS | Status: DC
  Administered 2018-02-11: 17:00:00 15 [IU] via SUBCUTANEOUS
  Administered 2018-02-11 – 2018-02-12 (×3): 11 [IU] via SUBCUTANEOUS
  Filled 2018-02-11 (×4): qty 1

## 2018-02-11 MED ORDER — PREDNISONE 20 MG PO TABS
40.0000 mg | ORAL_TABLET | Freq: Every day | ORAL | Status: DC
  Administered 2018-02-12: 13:00:00 40 mg via ORAL
  Filled 2018-02-11: qty 2

## 2018-02-11 MED ORDER — SENNOSIDES-DOCUSATE SODIUM 8.6-50 MG PO TABS: 1.0000 | ORAL_TABLET | Freq: Every evening | ORAL | Status: DC | PRN

## 2018-02-11 MED ORDER — MOMETASONE FURO-FORMOTEROL FUM 200-5 MCG/ACT IN AERO
2.0000 | INHALATION_SPRAY | Freq: Two times a day (BID) | RESPIRATORY_TRACT | Status: DC
  Administered 2018-02-11 – 2018-02-12 (×3): 2 via RESPIRATORY_TRACT
  Filled 2018-02-11: qty 8.8

## 2018-02-11 MED ORDER — INSULIN ASPART 100 UNIT/ML ~~LOC~~ SOLN
0.0000 [IU] | Freq: Every day | SUBCUTANEOUS | Status: DC
  Administered 2018-02-11: 4 [IU] via SUBCUTANEOUS
  Filled 2018-02-11: qty 1

## 2018-02-11 MED ORDER — BISACODYL 5 MG PO TBEC: 5.0000 mg | DELAYED_RELEASE_TABLET | Freq: Every day | ORAL | Status: DC | PRN

## 2018-02-11 MED ORDER — INSULIN GLARGINE 100 UNIT/ML ~~LOC~~ SOLN
10.0000 [IU] | Freq: Every day | SUBCUTANEOUS | Status: DC
  Administered 2018-02-11 – 2018-02-12 (×2): 10 [IU] via SUBCUTANEOUS
  Filled 2018-02-11 (×3): qty 0.1

## 2018-02-11 MED ORDER — ONDANSETRON HCL 4 MG/2ML IJ SOLN: 4.0000 mg | Freq: Four times a day (QID) | INTRAMUSCULAR | Status: DC | PRN

## 2018-02-11 NOTE — Progress Notes (Signed)
[place] at [place] NAME: Whitney Fleming    MR#:  [id number]  DATE OF BIRTH:  20-Dec-1945  SUBJECTIVE:   Witness of breath and wheezing have improved  REVIEW OF SYSTEMS:    Review of Systems  Constitutional: Negative for fever, chills weight loss HENT: Negative for ear pain, nosebleeds, congestion, facial swelling, rhinorrhea, neck pain, neck stiffness and ear discharge.   Respiratory: As it of for cough and shortness of breath and wheezing have improved  cardiovascular: Negative for chest pain, palpitations and leg swelling.  Gastrointestinal: Negative for heartburn, abdominal pain, vomiting, diarrhea or consitpation Genitourinary: Negative for dysuria, urgency, frequency, hematuria Musculoskeletal: Negative for back pain or joint pain Neurological: Negative for dizziness, seizures, syncope, focal weakness,  numbness and headaches.  Hematological: Does not bruise/bleed easily.  Psychiatric/Behavioral: Negative for hallucinations, confusion, dysphoric mood    Tolerating Diet: yes      DRUG ALLERGIES:  No Known Allergies  VITALS:  Blood pressure 140/76, pulse (!) 112, temperature 97.9 F (36.6 C), temperature source Oral, resp. rate (!) 22, height 4\' 10"  (1.473 m), weight 76.4 kg, SpO2 98 %.  PHYSICAL EXAMINATION:  Constitutional: Appears well-developed and well-nourished. No distress. HENT: Normocephalic. Marland Kitchen Oropharynx is clear and moist.  Eyes: Conjunctivae and EOM are normal. PERRLA, no scleral icterus.  Neck: Normal ROM. Neck supple. No JVD. No tracheal deviation. CVS: RRR, S1/S2 +, no murmurs, no gallops, no carotid bruit.  Pulmonary: Effort and breath sounds normal, no stridor, rhonchi, wheezes, rales.  Abdominal: Soft. BS +,  no distension, tenderness, rebound or guarding.  Musculoskeletal: Normal range of motion. No edema and no tenderness.  Neuro: Alert. CN 2-12 grossly intact. No focal deficits. Skin: Skin is warm and  dry. No rash noted. Psychiatric: Normal mood and affect.      LABORATORY PANEL:   CBC Recent Labs  Lab 02/11/18 0001  WBC 11.7*  HGB 11.8*  HCT 36.0  PLT 261   ------------------------------------------------------------------------------------------------------------------  Chemistries  Recent Labs  Lab 02/05/18 1516 02/11/18 0001  NA 124* 132*  K 5.0 5.1  CL 95* 102  CO2 19* 22  GLUCOSE 410* 267*  BUN 24* 15  CREATININE 1.17* 1.02*  CALCIUM 8.6* 8.7*  AST 21  --   ALT 17  --   ALKPHOS 69  --   BILITOT 0.8  --    ------------------------------------------------------------------------------------------------------------------  Cardiac Enzymes Recent Labs  Lab 02/11/18 0001  TROPONINI <0.03   ------------------------------------------------------------------------------------------------------------------  RADIOLOGY:  Ct Angio Chest Pe W And/or Wo Contrast  Result Date: 02/11/2018 CLINICAL DATA:  72 year old female with shortness of breath. Chest pain. EXAM: CT ANGIOGRAPHY CHEST WITH CONTRAST TECHNIQUE: Multidetector CT imaging of the chest was performed using the standard protocol during bolus administration of intravenous contrast. Multiplanar CT image reconstructions and MIPs were obtained to evaluate the vascular anatomy. CONTRAST:  53mL OMNIPAQUE IOHEXOL 350 MG/ML SOLN COMPARISON:  Chest radiograph dated 02/28/2018 and CT dated 12/23/2012 FINDINGS: Cardiovascular: There is no cardiomegaly or pericardial effusion. Coronary vascular calcification noted. There is moderate atherosclerotic calcification of the thoracic aorta. No aneurysmal dilatation or dissection. The origins of the great vessels of the aortic arch are patent. Evaluation of the pulmonary arteries is limited due to respiratory motion artifact. No definite large or central pulmonary artery embolus identified. Nonocclusive apparent linear hypodensity in the periphery of a right lower lobe subsegmental  pulmonary artery branch (series 5, image 139) is most likely artifactual. This is less likely related to  an old infarct/scarring. Evaluation of this area is limited due to motion artifact. Mediastinum/Nodes: There is no hilar or mediastinal adenopathy. Esophagus and the thyroid gland are grossly unremarkable. No mediastinal fluid collection. Calcified granuloma noted in the mediastinum. Lungs/Pleura: There is bibasilar subpleural interstitial coarsening/fibrosis. No focal consolidation, pleural effusion, or pneumothorax. The central airways are patent. Upper Abdomen: There is a small stone within the gallbladder. No pericholecystic fluid. The visualized upper abdomen is otherwise unremarkable. Musculoskeletal: Age indeterminate L1 compression fracture, likely old. Clinical correlation is recommended. No acute fracture. Review of the MIP images confirms the above findings. IMPRESSION: 1. No acute intrathoracic pathology. No CT evidence of central pulmonary artery embolus. 2. Bibasilar interstitial coarsening/fibrosis. 3.  Aortic Atherosclerosis (ICD10-I70.0). Electronically Signed   By: Anner Crete M.D.   On: 02/11/2018 01:08   Dg Chest Port 1 View  Result Date: 02/11/2018 CLINICAL DATA:  Shortness of breath, productive cough. History of COPD. EXAM: PORTABLE CHEST 1 VIEW COMPARISON:  Chest radiograph December 02, 2017 FINDINGS: Cardiomediastinal silhouette is normal. Calcified aortic arch. Bronchitic changes. Bibasilar strandy densities most apparent at costophrenic angles. No pleural effusions or focal consolidations. Trachea projects midline and there is no pneumothorax. Soft tissue planes and included osseous structures are non-suspicious. IMPRESSION: 1. Bronchitic changes without focal consolidation. Bibasilar atelectasis. 2. Aortic Atherosclerosis (ICD10-I70.0). Electronically Signed   By: Elon Alas M.D.   On: 02/11/2018 00:14     ASSESSMENT AND PLAN:   72 year old female with a history of COPD  and diabetes who presents with shortness of breath.  1.  Acute hypoxic respiratory failure in the setting of acute exacerbation of COPD: Patient symptoms are improving and has been titrated off of oxygen continue to wean IV steroids to oral prednisone Doxycycline for acute bronchitis leading to COPD exacerbation Continue cough syrup as needed Continue inhalers and nebulizer treatment  2.  Diabetes: Continue sliding scale with ADA diet. Add low-dose Lantus while in the hospital.  3.  Essential hypertension: Continue Coreg and losartan  4.  Hyperlipidemia: Continue Crestor  5.  Chronic hyponatremia: BMP for a.m.    Management plans discussed with the patient and she is in agreement.  CODE STATUS: Full  TOTAL TIME TAKING CARE OF THIS PATIENT: 27 minutes.     POSSIBLE D/C tomorrow, DEPENDING ON CLINICAL CONDITION.   Srinivas Lippman M.D on 02/11/2018 at 11:25 AM  Between 7am to 6pm - Pager - [phone] After 6pm go to [email or web address] - password [email or web address] [facility] [facility]  Office  [phone]  CC: Primary care physician; [name], MD  Note: This dictation was prepared with Dragon dictation along with smaller phrase technology. Any transcriptional errors that result from this process are unintentional.

## 2018-02-11 NOTE — ED Notes (Signed)
Patient transported to CT 

## 2018-02-11 NOTE — Telephone Encounter (Signed)
Pt is currently admitted

## 2018-02-11 NOTE — Telephone Encounter (Signed)
ok 

## 2018-02-11 NOTE — ED Notes (Signed)
Patient transported to room 105 by this EDT.

## 2018-02-11 NOTE — Telephone Encounter (Signed)
Pt daughter wanted to let Dr.Ram know her mother is in [facility]

## 2018-02-11 NOTE — Progress Notes (Signed)
PT Cancellation Note  Patient Details Name: Whitney Fleming MRN: [id number] DOB: 04-Jul-1945   Cancelled Treatment:    Reason Eval/Treat Not Completed: Other (comment).  Family wants daughter of pt to be there to hear translation and she is working.  Try again at another time.   Ramond Dial 02/11/2018, 4:37 PM   Mee Hives, PT MS Acute Rehab Dept. Number: [place] and [place]

## 2018-02-11 NOTE — ED Notes (Signed)
Daughter: [phone] cell; [phone] home. Daughter is english speaking.

## 2018-02-11 NOTE — ED Notes (Signed)
Pt states she is feeling better.  nsr on monitor.   Family with pt

## 2018-02-11 NOTE — ED Notes (Signed)
Report off to collyn rn

## 2018-02-11 NOTE — ED Notes (Signed)
Pt with difficulty breathing for 1 week.  Sx worse today.  Pt using breathing treatments at home without relief.  Hx copd.  Pt wheezing on arrival to treatment room  Breathing treatments given.  md at bedside.  Iv started and labs sent.  Family with pt.

## 2018-02-11 NOTE — Progress Notes (Signed)
IS education done pt family translated, pt and family understands technique and reason for use. Pt inspire 581ml

## 2018-02-11 NOTE — Plan of Care (Signed)

## 2018-02-11 NOTE — H&P (Addendum)
[place] [facility] [place] NAME: Whitney Fleming    MR#:  [id number]  DATE OF BIRTH:  Apr 21, 1946  DATE OF ADMISSION:  02/10/2018  PRIMARY CARE PHYSICIAN: [name], MD   REQUESTING/REFERRING PHYSICIAN: Gregor Hams, MD  CHIEF COMPLAINT:   Chief Complaint  Patient presents with  . Shortness of Breath    HISTORY OF PRESENT ILLNESS:  Whitney Fleming  is a 72 y.o. female with a known history of COPD/asthma, T2NIDDM p/w 1d Hx SOB. Pt speaks Hindi + Gujurati. I unfortunately do not. Hx is obtained from pt's daughter at bedside. Pt reportedly became acutely SOB @~1900-2000PM on Monday 02/10/2018 evening. SOB was initially mild, but became progressively worse over several hours. Pt had decreased exercise tolerance, citing difficulty making transfers and walking in the house. She also endorses a 2-3d Hx of cough, intermittently productive of yellow sputum. Her daughter tells me she has had four COPD exacerbations between March and present. She follows w/ a Pulmonologist, and uses Symbicort at home. She is saturating well on 2L [place], and does not appear to be in distress. She does not exhibit labored breathing, tripoding, retractions or accessory muscle use, and has not needed NIPPV. She does not appear septic/toxic. CXR (-) pneumonia.  PAST MEDICAL HISTORY:   Past Medical History:  Diagnosis Date  . Asthma   . Cardiomegaly   . Diabetes mellitus without complication ([place])   . Hypertension   . Hyponatremia    PAST SURGICAL HISTORY:   Past Surgical History:  Procedure Laterality Date  . TUBAL LIGATION     SOCIAL HISTORY:   Social History   Tobacco Use  . Smoking status: Never Smoker  . Smokeless tobacco: Never Used  Substance Use Topics  . Alcohol use: Never    Frequency: Never   FAMILY HISTORY:   Family History  Problem Relation Age of Onset  . Tuberculosis Other   . Healthy Mother   . Heart disease Father   . Asthma Father     . Cancer Neg Hx    DRUG ALLERGIES:  No Known Allergies  REVIEW OF SYSTEMS:   Review of Systems  Unable to perform ROS: Language  Constitutional: Positive for diaphoresis. Negative for chills and fever.  HENT: Negative for congestion and sore throat.   Respiratory: Positive for cough, sputum production and shortness of breath. Negative for hemoptysis.   Cardiovascular: Negative for chest pain and palpitations.  Gastrointestinal: Negative for abdominal pain, diarrhea, nausea and vomiting.  Musculoskeletal: Negative for back pain and falls.  Skin: Negative for rash.  Neurological: Negative for dizziness, tingling, focal weakness, loss of consciousness, weakness and headaches.  All other systems reviewed and are negative.  MEDICATIONS AT HOME:   Prior to Admission medications   Medication Sig Start Date End Date Taking? Authorizing Provider  AMBULATORY NON FORMULARY MEDICATION Medication Name: Aero chamber use as directed. FI:E33.295 12/24/17  Yes [name], MD  budesonide-formoterol (SYMBICORT) 160-4.5 MCG/ACT inhaler Inhale 2 puffs into the lungs 2 (two) times daily. 12/09/17  Yes [name], MD  carvedilol (COREG) 12.5 MG tablet Take 1 tablet (12.5 mg total) by mouth 2 (two) times daily with a meal. 12/19/17  Yes Bacigalupo, Dionne Bucy, MD  empagliflozin (JARDIANCE) 25 MG TABS tablet Take 25 mg by mouth daily. 12/19/17  Yes Bacigalupo, Dionne Bucy, MD  losartan (COZAAR) 50 MG tablet Take 1 tablet (50 mg total) by mouth daily. 12/26/17  Yes Bacigalupo, Dionne Bucy, MD  metFORMIN (GLUCOPHAGE) 1000 MG tablet Take 1 tablet (1,000 mg total) by mouth 2 (two) times daily with a meal. 12/19/17  Yes Bacigalupo, Dionne Bucy, MD  montelukast (SINGULAIR) 10 MG tablet Take 1 tablet (10 mg total) by mouth at bedtime. 12/24/17  Yes [name], MD  PRESCRIPTION MEDICATION **Non-US market drug** Zyrova C (rosuvastatin/clopidigrel) Take 1 capsule by mouth every evening   Yes [provider]  levalbuterol (XOPENEX) 1.25 MG/3ML nebulizer solution Take 1.25 mg by nebulization every 6 (six) hours as needed for wheezing. DX:J44.9 Patient not taking: Reported on 02/04/2018 01/29/18 01/29/19  [name], MD      VITAL SIGNS:  Blood pressure (!) 103/36, pulse 92, resp. rate 20, height 4\' 10"  (1.473 m), weight 76.4 kg, SpO2 100 %.  PHYSICAL EXAMINATION:  Physical Exam  Constitutional: She appears well-developed and well-nourished. She is active and cooperative.  Non-toxic appearance. She does not have a sickly appearance. She does not appear ill. No distress. She is not intubated. Nasal cannula in place.  HENT:  Head: Normocephalic and atraumatic.  Mouth/Throat: Oropharynx is clear and moist. No oropharyngeal exudate or posterior oropharyngeal edema.  Eyes: Conjunctivae, EOM and lids are normal. No scleral icterus.  Neck: Neck supple. No JVD present. No thyromegaly present.  Cardiovascular: Normal rate, regular rhythm, S1 normal and S2 normal.  No extrasystoles are present. Exam reveals no gallop, no S3, no S4, no distant heart sounds and no friction rub.  No murmur heard. Pulmonary/Chest: Effort normal. No accessory muscle usage or stridor. No apnea, no tachypnea and no bradypnea. She is not intubated. No respiratory distress. She has decreased breath sounds in the right upper field, the right middle field, the right lower field, the left upper field, the left middle field and the left lower field. She has wheezes in the right upper field, the right middle field, the right lower field, the left upper field, the left middle field and the left lower field. She has no rhonchi. She has no rales.  Abdominal: Soft. Bowel sounds are normal. She exhibits no distension. There is no tenderness. There is no rebound and no guarding.  Musculoskeletal: Normal range of motion. She exhibits no edema or tenderness.       Right lower leg: Normal. She exhibits no tenderness and no  edema.       Left lower leg: Normal. She exhibits no tenderness and no edema.  Lymphadenopathy:    She has no cervical adenopathy.  Neurological: She is alert. She is not disoriented.  Skin: Skin is warm, dry and intact. No rash noted. She is not diaphoretic. No erythema. No pallor.  Psychiatric: She has a normal mood and affect. Her speech is normal and behavior is normal. Judgment and thought content normal. Her mood appears not anxious. She is not agitated. Cognition and memory are normal.   LABORATORY PANEL:   CBC Recent Labs  Lab 02/11/18 0001  WBC 11.7*  HGB 11.8*  HCT 36.0  PLT 261   ------------------------------------------------------------------------------------------------------------------  Chemistries  Recent Labs  Lab 02/05/18 1516 02/11/18 0001  NA 124* 132*  K 5.0 5.1  CL 95* 102  CO2 19* 22  GLUCOSE 410* 267*  BUN 24* 15  CREATININE 1.17* 1.02*  CALCIUM 8.6* 8.7*  AST 21  --   ALT 17  --   ALKPHOS 69  --   BILITOT 0.8  --    ------------------------------------------------------------------------------------------------------------------  Cardiac Enzymes Recent Labs  Lab 02/11/18 0001  TROPONINI <0.03   ------------------------------------------------------------------------------------------------------------------  RADIOLOGY:  Ct Angio Chest Pe W And/or Wo Contrast  Result Date: 02/11/2018 CLINICAL DATA:  72 year old female with shortness of breath. Chest pain. EXAM: CT ANGIOGRAPHY CHEST WITH CONTRAST TECHNIQUE: Multidetector CT imaging of the chest was performed using the standard protocol during bolus administration of intravenous contrast. Multiplanar CT image reconstructions and MIPs were obtained to evaluate the vascular anatomy. CONTRAST:  110mL OMNIPAQUE IOHEXOL 350 MG/ML SOLN COMPARISON:  Chest radiograph dated 02/28/2018 and CT dated 12/23/2012 FINDINGS: Cardiovascular: There is no cardiomegaly or pericardial effusion. Coronary vascular  calcification noted. There is moderate atherosclerotic calcification of the thoracic aorta. No aneurysmal dilatation or dissection. The origins of the great vessels of the aortic arch are patent. Evaluation of the pulmonary arteries is limited due to respiratory motion artifact. No definite large or central pulmonary artery embolus identified. Nonocclusive apparent linear hypodensity in the periphery of a right lower lobe subsegmental pulmonary artery branch (series 5, image 139) is most likely artifactual. This is less likely related to an old infarct/scarring. Evaluation of this area is limited due to motion artifact. Mediastinum/Nodes: There is no hilar or mediastinal adenopathy. Esophagus and the thyroid gland are grossly unremarkable. No mediastinal fluid collection. Calcified granuloma noted in the mediastinum. Lungs/Pleura: There is bibasilar subpleural interstitial coarsening/fibrosis. No focal consolidation, pleural effusion, or pneumothorax. The central airways are patent. Upper Abdomen: There is a small stone within the gallbladder. No pericholecystic fluid. The visualized upper abdomen is otherwise unremarkable. Musculoskeletal: Age indeterminate L1 compression fracture, likely old. Clinical correlation is recommended. No acute fracture. Review of the MIP images confirms the above findings. IMPRESSION: 1. No acute intrathoracic pathology. No CT evidence of central pulmonary artery embolus. 2. Bibasilar interstitial coarsening/fibrosis. 3.  Aortic Atherosclerosis (ICD10-I70.0). Electronically Signed   By: Anner Crete M.D.   On: 02/11/2018 01:08   Dg Chest Port 1 View  Result Date: 02/11/2018 CLINICAL DATA:  Shortness of breath, productive cough. History of COPD. EXAM: PORTABLE CHEST 1 VIEW COMPARISON:  Chest radiograph December 02, 2017 FINDINGS: Cardiomediastinal silhouette is normal. Calcified aortic arch. Bronchitic changes. Bibasilar strandy densities most apparent at costophrenic angles. No  pleural effusions or focal consolidations. Trachea projects midline and there is no pneumothorax. Soft tissue planes and included osseous structures are non-suspicious. IMPRESSION: 1. Bronchitic changes without focal consolidation. Bibasilar atelectasis. 2. Aortic Atherosclerosis (ICD10-I70.0). Electronically Signed   By: Elon Alas M.D.   On: 02/11/2018 00:14   IMPRESSION AND PLAN:   A/P: 37F acute hypoxemic respiratory failure, mild acute COPD exacerbation. Hyponatremia, hyperglycemia (w/ T2NIDDM), Cr elevation/CKD III, normocytic anemia. -HRF/COPD: Pt p/w 1d Hx SOB, 2-3d Hx productive cough. (+) diminished breath sounds + mild end-expiratory wheezing on exam. Not in distress. Stable SpO2 on [place] O2. Mild exacerbation. DuoNebs, steroids (IV, transition to PO). ABx not indicated. Incentive spirometry, pulmonary toileting. C/w LABA/ICS. Daughter (justifiably) concerned given recurrent exacerbations. Will need Pulmonology f/up, PFTs if not done. May benefit from addition of Spiriva to home regimen. May also benefit from home nebulizer machine, as well as evaluation for OSA. Pt's daughter asked about home O2, but pt had stable room air SpO2 on ED arrival, and is on O2 largely for comfort; I do not think pt will qualify for home O2. -Hyponatremia: Na+ 132. Likely hypovolemic. EF 55-60% as of 09/26/2017 Echo. IVF NS. -Hyperglycemia/T2NIDDM: SSI. Hold PO antihyperglycemics. -Cr elevation/CKD III: Cr 1.02 on present admission. Baseline 0.8-1.1. 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 Avoid nephrotoxins. -Normocytic anemia: Likely anemia of chronic disease. No evidence  of acute blood loss at present time. -c/w home meds. -FEN/GI: Cardiac diabetic diet. -DVT PPx: Lovenox. -Code status: Full code. -Disposition: Observation, < 2 midnights.   All the records are reviewed and case discussed with ED provider. Management plans discussed with the patient, family and they are in agreement.  CODE STATUS: Full  code.  TOTAL TIME TAKING CARE OF THIS PATIENT: 75 minutes.    Arta Silence M.D on 02/11/2018 at 4:01 AM  Between 7am to 6pm - Pager - [phone]  After 6pm go to [email or web address] - Proofreader  [facility]  Office  [phone]  CC: Primary care physician; [name], MD   Note: This dictation was prepared with Dragon dictation along with smaller phrase technology. Any transcriptional errors that result from this process are unintentional.

## 2018-02-12 ENCOUNTER — Ambulatory Visit: Payer: Medicaid Other | Admitting: Family Medicine

## 2018-02-12 ENCOUNTER — Telehealth: Payer: Self-pay | Admitting: Family Medicine

## 2018-02-12 DIAGNOSIS — J9601 Acute respiratory failure with hypoxia: Secondary | ICD-10-CM | POA: Diagnosis not present

## 2018-02-12 DIAGNOSIS — E1165 Type 2 diabetes mellitus with hyperglycemia: Secondary | ICD-10-CM | POA: Diagnosis not present

## 2018-02-12 DIAGNOSIS — J441 Chronic obstructive pulmonary disease with (acute) exacerbation: Secondary | ICD-10-CM | POA: Diagnosis not present

## 2018-02-12 LAB — BASIC METABOLIC PANEL
ANION GAP: 13 (ref 5–15)
BUN: 24 mg/dL — ABNORMAL HIGH (ref 8–23)
CHLORIDE: 101 mmol/L (ref 98–111)
CO2: 21 mmol/L — ABNORMAL LOW (ref 22–32)
Calcium: 8.8 mg/dL — ABNORMAL LOW (ref 8.9–10.3)
Creatinine, Ser: 0.88 mg/dL (ref 0.44–1.00)
GFR calc Af Amer: >60 mL/min (ref >60)
Glucose, Bld: 305 mg/dL — ABNORMAL HIGH (ref 70–99)
POTASSIUM: 4.8 mmol/L (ref 3.5–5.1)
Sodium: 135 mmol/L (ref 135–145)

## 2018-02-12 LAB — GLUCOSE, CAPILLARY
GLUCOSE-CAPILLARY: 309 mg/dL — AB (ref 70–99)
Glucose-Capillary: 322 mg/dL — ABNORMAL HIGH (ref 70–99)

## 2018-02-12 MED ORDER — DOXYCYCLINE HYCLATE 100 MG PO TABS: 100.0000 mg | ORAL_TABLET | Freq: Two times a day (BID) | ORAL | 0 refills | Status: AC

## 2018-02-12 MED ORDER — PREDNISONE 10 MG PO TABS: 40.0000 mg | ORAL_TABLET | Freq: Every day | ORAL | 0 refills | Status: AC

## 2018-02-12 MED ORDER — LEVALBUTEROL HCL 1.25 MG/3ML IN NEBU: 1.2500 mg | INHALATION_SOLUTION | Freq: Four times a day (QID) | RESPIRATORY_TRACT | 5 refills | Status: DC | PRN

## 2018-02-12 MED ORDER — INSULIN GLARGINE 100 UNIT/ML SOLOSTAR PEN: 10.0000 [IU] | PEN_INJECTOR | Freq: Every day | SUBCUTANEOUS | 0 refills | Status: DC

## 2018-02-12 NOTE — Evaluation (Signed)
Physical Therapy Evaluation Patient Details Name: Whitney Fleming MRN: [id number] DOB: [birth year] Today's Date: 02/12/2018   History of Present Illness  Pt is a 72 year old female with a history of COPD and diabetes who presents with shortness of breath.  Assessment includes: Acute hypoxic respiratory failure in the setting of acute exacerbation of COPD, DM, HLD, and chronic hyponatremia.     Clinical Impression  Pt presents with deficits in strength, transfers, gait, and activity tolerance.  Pt's BG 309 but was recently weaned off of IV steroids to oral prednisone.  Pt was Ind with bed mobility tasks and SBA with transfers with good control and stability.  Pt's amb was slow but steady with a RW over a distance of 30'.   Pt's SpO2 remained >/= 94% throughout session with HR 103 bpm at rest and 117 bpm after ambulation.  Pt moderately SOB after amb but improved quickly upon return to sitting.  Pt will benefit from HHPT services upon discharge to safely address above deficits for decreased caregiver assistance and eventual return to PLOF.      Follow Up Recommendations Home health PT    Equipment Recommendations  None recommended by PT    Recommendations for Other Services       Precautions / Restrictions Precautions Precautions: Fall Restrictions Weight Bearing Restrictions: No      Mobility  Bed Mobility Overal bed mobility: Independent                Transfers Overall transfer level: Needs assistance Equipment used: Rolling walker (2 wheeled) Transfers: Sit to/from Stand Sit to Stand: Supervision         General transfer comment: Good eccentric and concentric control during transfers  Ambulation/Gait Ambulation/Gait assistance: Min guard Gait Distance (Feet): 30 Feet Assistive device: Rolling walker (2 wheeled) Gait Pattern/deviations: Step-through pattern;Decreased step length - right;Decreased step length - left Gait velocity: Decreased   General Gait Details:  Good stability during ambulation with a RW including during 180 deg turns, SpO2 remained >/= 94% throughout session with HR 103 bpm at rest and 117 bpm with exertion.    Stairs            Wheelchair Mobility    Modified Rankin (Stroke Patients Only)       Balance Overall balance assessment: No apparent balance deficits (not formally assessed)                                           Pertinent Vitals/Pain Pain Assessment: No/denies pain    Home Living Family/patient expects to be discharged to:: Private residence(Language line used throughout session, rep Ankit, ref# [phone], language Gujarati) Living Arrangements: Spouse/significant other;Children Available Help at Discharge: Family;Available 24 hours/day Type of Home: House Home Access: Stairs to enter Entrance Stairs-Rails: None Entrance Stairs-Number of Steps: 2 Home Layout: One level Home Equipment: Walker - 2 wheels      Prior Function Level of Independence: Independent with assistive device(s)         Comments: Mod Ind amb mostly without AD but with occasional use of the RW as needed, Ind with ADLs, has never fallen, HHA up/down stairs     Hand Dominance        Extremity/Trunk Assessment   Upper Extremity Assessment Upper Extremity Assessment: Overall WFL for tasks assessed    Lower Extremity Assessment Lower Extremity Assessment: Generalized weakness  Communication   Communication: Psychologist, sport and exercise used during session, rep Ankit, ref# [id number], language Gujarati)  Cognition Arousal/Alertness: Awake/alert Behavior During Therapy: WFL for tasks assessed/performed Overall Cognitive Status: Within Functional Limits for tasks assessed                                        General Comments      Exercises     Assessment/Plan    PT Assessment Patient needs continued PT services  PT Problem List Decreased strength;Decreased activity  tolerance       PT Treatment Interventions DME instruction;Stair training;Gait training;Balance training;Therapeutic exercise;Therapeutic activities;Patient/family education;Functional mobility training    PT Goals (Current goals can be found in the Care Plan section)  Acute Rehab PT Goals PT Goal Formulation: Patient unable to participate in goal setting Time For Goal Achievement: 02/25/18 Potential to Achieve Goals: Good    Frequency Min 2X/week   Barriers to discharge        Co-evaluation               AM-PAC PT "6 Clicks" Daily Activity  Outcome Measure Difficulty turning over in bed (including adjusting bedclothes, sheets and blankets)?: None Difficulty moving from lying on back to sitting on the side of the bed? : None Difficulty sitting down on and standing up from a chair with arms (e.g., wheelchair, bedside commode, etc,.)?: None Help needed moving to and from a bed to chair (including a wheelchair)?: None Help needed walking in hospital room?: A Little Help needed climbing 3-5 steps with a railing? : A Little 6 Click Score: 22    End of Session Equipment Utilized During Treatment: Gait belt Activity Tolerance: Patient tolerated treatment well Patient left: in bed;with bed alarm set;with family/visitor present;with call bell/phone within reach Nurse Communication: Mobility status PT Visit Diagnosis: Muscle weakness (generalized) (M62.81);Difficulty in walking, not elsewhere classified (R26.2)    Time: 3419-3790 PT Time Calculation (min) (ACUTE ONLY): 27 min   Charges:   PT Evaluation $PT Eval Low Complexity: 1 Low          D. Scott Mittie Knittel PT, DPT 02/12/18, 11:51 AM

## 2018-02-12 NOTE — Telephone Encounter (Signed)
I am surprised that the patient was requiring insulin while in the hospital, but then was discharged without any insulin.  She can continue her oral medications for diabetes.  She can also start Lantus 10 units nightly.  I sent a prescription for this to her pharmacy.  We will review all of these at her hospital follow-up next week and make any other changes that need to be made.  If she develops hypoglycemia, they should let me know, but this should not be too high of a dose as she was tolerating this while hospitalized.  [name], MD, MPH [facility] 02/12/2018 4:56 PM

## 2018-02-12 NOTE — Care Management Note (Deleted)
Case Management Note  Patient Details  Name: Whitney Fleming MRN: [id number] Date of Birth: 1945/10/20  Subjective/Objective:   Patient with  hx of COPD and Diabetes who presents with shortness of breath, respiratory failure. Spoke with daughter to discuss discharge planning. Provided a list of home health agencies. [facility]erral to [facility] for RN and PT. She refused a [facility]. PCP is Dr. Brita Romp.    Action/Plan: [facility] for RN and PT  Expected Discharge Date:  02/12/18               Expected Discharge Plan:     In-House Referral:     Discharge planning Services  CM Consult  Post Acute Care Choice:  Home Health Choice offered to:  Adult Children  DME Arranged:    DME Agency:     HH Arranged:  RN, PT HH Agency:    [facility]  Status of Service:  Completed, signed off  If discussed at [place] of Stay Meetings, dates discussed:    Additional Comments:  Jolly Mango, RN 02/12/2018, 12:28 PM

## 2018-02-12 NOTE — Progress Notes (Signed)
Pt ambulated times one around the nurse's station on 02/11/18 at 21:50 pm her Sats were 95 % on R/A and while ambulating her Sats dropped to 92% and heart rate increased to 116.

## 2018-02-12 NOTE — Care Management (Signed)
Attempted to set up home health. Called [facility], [facility], [facility], [facility], [facility], [facility], [facility] without success. None of these agencies could accept patient. Left message on patients VM regarding the above. Instructed her to call her PCP with any acute needs.

## 2018-02-12 NOTE — Progress Notes (Signed)
Pt alert and oriented, no English speaking. Lung sounds Wheezy on expiratory, continue getting SOB with ambulation. Cough syrup times two with relief. A breathing tx PRN for wheezing times one with relief. Continue to monitor

## 2018-02-12 NOTE — Telephone Encounter (Signed)
Pt's daughter called saying mom is leaving the hospital today.  She was in for acute resp and hypoxia.   She is discharging now and she will be needing something to control her diabetes when she gets home.  They told her to call her primary.  The discharging nurse just called and made her HUF for 9/11 at 11:00/    Daughter wants Dr. B to call her back and talk about her diabetes medications.  Pt's CB [phone]  Thanks Con Memos

## 2018-02-12 NOTE — Discharge Summary (Signed)
[facility] NAME: Whitney Fleming    MR#:  [id number]  DATE OF BIRTH:  1945-10-08  DATE OF ADMISSION:  02/10/2018 ADMITTING PHYSICIAN: Arta Silence, MD  DATE OF DISCHARGE: 02/12/2018  PRIMARY CARE PHYSICIAN: [name], MD    ADMISSION DIAGNOSIS:  COPD exacerbation ([place]) [J44.1] Acute hypoxemic respiratory failure ([place]) [J96.01]  DISCHARGE DIAGNOSIS:  Active Problems:   Acute hypoxemic respiratory failure ([place])   SECONDARY DIAGNOSIS:   Past Medical History:  Diagnosis Date  . Asthma   . Cardiomegaly   . Diabetes mellitus without complication ([place])   . Hypertension   . Hyponatremia     HOSPITAL COURSE:    72 year old female with a history of COPD and diabetes who presents with shortness of breath.  1.  Acute hypoxic respiratory failure in the setting of acute exacerbation of COPD: Patient symptoms have improved and she has been titrated off of oxygen.   She was weaned off of  IV steroids to oral prednisone She will continue Doxycycline for acute bronchitis leading to COPD exacerbation Continue inhalers and nebulizer treatment  2.  Diabetes:She will continue outpatient medications and ADA diet. 3.  Essential hypertension: Continue Coreg and losartan  4.  Hyperlipidemia: Continue Crestor  5.  Chronic hyponatremia: Sodium is stable   DISCHARGE CONDITIONS AND DIET:   Stable  Diabetic diet  CONSULTS OBTAINED:  Treatment Team:  Arta Silence, MD  DRUG ALLERGIES:  No Known Allergies  DISCHARGE MEDICATIONS:   Allergies as of 02/12/2018   No Known Allergies     Medication List    TAKE these medications   AMBULATORY NON FORMULARY MEDICATION Medication Name: Aero chamber use as directed. DX:J45.909   budesonide-formoterol 160-4.5 MCG/ACT inhaler Commonly known as:  SYMBICORT Inhale 2 puffs into the lungs 2 (two) times daily.   carvedilol 12.5 MG tablet Commonly known as:   COREG Take 1 tablet (12.5 mg total) by mouth 2 (two) times daily with a meal.   doxycycline 100 MG tablet Commonly known as:  VIBRA-TABS Take 1 tablet (100 mg total) by mouth every 12 (twelve) hours for 3 days.   empagliflozin 25 MG Tabs tablet Commonly known as:  JARDIANCE Take 25 mg by mouth daily.   levalbuterol 1.25 MG/3ML nebulizer solution Commonly known as:  XOPENEX Take 1.25 mg by nebulization every 6 (six) hours as needed for wheezing. DX:J44.9   losartan 50 MG tablet Commonly known as:  COZAAR Take 1 tablet (50 mg total) by mouth daily.   metFORMIN 1000 MG tablet Commonly known as:  GLUCOPHAGE Take 1 tablet (1,000 mg total) by mouth 2 (two) times daily with a meal.   montelukast 10 MG tablet Commonly known as:  SINGULAIR Take 1 tablet (10 mg total) by mouth at bedtime.   predniSONE 10 MG tablet Commonly known as:  DELTASONE Take 4 tablets (40 mg total) by mouth daily with breakfast for 3 days.   PRESCRIPTION MEDICATION **Non-US market drug** Zyrova C (rosuvastatin/clopidigrel) Take 1 capsule by mouth every evening         Today   CHIEF COMPLAINT:  Patient doing well   VITAL SIGNS:  Blood pressure 129/61, pulse 92, temperature 97.9 F (36.6 C), temperature source Oral, resp. rate 14, height 4\' 10"  (1.473 m), weight 76.4 kg, SpO2 98 %.   REVIEW OF SYSTEMS:  Review of Systems  Constitutional: Negative.  Negative for chills, fever and malaise/fatigue.  HENT: Negative.  Negative for ear discharge, ear  pain, hearing loss, nosebleeds and sore throat.   Eyes: Negative.  Negative for blurred vision and pain.  Respiratory: Negative.  Negative for cough, hemoptysis, shortness of breath and wheezing.   Cardiovascular: Negative.  Negative for chest pain, palpitations and leg swelling.  Gastrointestinal: Negative.  Negative for abdominal pain, blood in stool, diarrhea, nausea and vomiting.  Genitourinary: Negative.  Negative for dysuria.  Musculoskeletal:  Negative.  Negative for back pain.  Skin: Negative.   Neurological: Negative for dizziness, tremors, speech change, focal weakness, seizures and headaches.  Endo/Heme/Allergies: Negative.  Does not bruise/bleed easily.  Psychiatric/Behavioral: Negative.  Negative for depression, hallucinations and suicidal ideas.     PHYSICAL EXAMINATION:  GENERAL:  72 y.o.-year-old patient lying in the bed with no acute distress.  NECK:  Supple, no jugular venous distention. No thyroid enlargement, no tenderness.  LUNGS: Normal breath sounds bilaterally, no wheezing, rales,rhonchi  No use of accessory muscles of respiration.  CARDIOVASCULAR: S1, S2 normal. No murmurs, rubs, or gallops.  ABDOMEN: Soft, non-tender, non-distended. Bowel sounds present. No organomegaly or mass.  EXTREMITIES: No pedal edema, cyanosis, or clubbing.  PSYCHIATRIC: The patient is alert and oriented x 3.  SKIN: No obvious rash, lesion, or ulcer.   DATA REVIEW:   CBC Recent Labs  Lab 02/11/18 0001  WBC 11.7*  HGB 11.8*  HCT 36.0  PLT 261    Chemistries  Recent Labs  Lab 02/05/18 1516  02/12/18 0404  NA 124*   < > 135  K 5.0   < > 4.8  CL 95*   < > 101  CO2 19*   < > 21*  GLUCOSE 410*   < > 305*  BUN 24*   < > 24*  CREATININE 1.17*   < > 0.88  CALCIUM 8.6*   < > 8.8*  AST 21  --   --   ALT 17  --   --   ALKPHOS 69  --   --   BILITOT 0.8  --   --    < > = values in this interval not displayed.    Cardiac Enzymes Recent Labs  Lab 02/11/18 0001  TROPONINI <0.03    Microbiology Results  @MICRORSLT48 @  RADIOLOGY:  Ct Angio Chest Pe W And/or Wo Contrast  Result Date: 02/11/2018 CLINICAL DATA:  72 year old female with shortness of breath. Chest pain. EXAM: CT ANGIOGRAPHY CHEST WITH CONTRAST TECHNIQUE: Multidetector CT imaging of the chest was performed using the standard protocol during bolus administration of intravenous contrast. Multiplanar CT image reconstructions and MIPs were obtained to evaluate the  vascular anatomy. CONTRAST:  19mL OMNIPAQUE IOHEXOL 350 MG/ML SOLN COMPARISON:  Chest radiograph dated 02/28/2018 and CT dated 12/23/2012 FINDINGS: Cardiovascular: There is no cardiomegaly or pericardial effusion. Coronary vascular calcification noted. There is moderate atherosclerotic calcification of the thoracic aorta. No aneurysmal dilatation or dissection. The origins of the great vessels of the aortic arch are patent. Evaluation of the pulmonary arteries is limited due to respiratory motion artifact. No definite large or central pulmonary artery embolus identified. Nonocclusive apparent linear hypodensity in the periphery of a right lower lobe subsegmental pulmonary artery branch (series 5, image 139) is most likely artifactual. This is less likely related to an old infarct/scarring. Evaluation of this area is limited due to motion artifact. Mediastinum/Nodes: There is no hilar or mediastinal adenopathy. Esophagus and the thyroid gland are grossly unremarkable. No mediastinal fluid collection. Calcified granuloma noted in the mediastinum. Lungs/Pleura: There is bibasilar subpleural interstitial coarsening/fibrosis. No  focal consolidation, pleural effusion, or pneumothorax. The central airways are patent. Upper Abdomen: There is a small stone within the gallbladder. No pericholecystic fluid. The visualized upper abdomen is otherwise unremarkable. Musculoskeletal: Age indeterminate L1 compression fracture, likely old. Clinical correlation is recommended. No acute fracture. Review of the MIP images confirms the above findings. IMPRESSION: 1. No acute intrathoracic pathology. No CT evidence of central pulmonary artery embolus. 2. Bibasilar interstitial coarsening/fibrosis. 3.  Aortic Atherosclerosis (ICD10-I70.0). Electronically Signed   By: Anner Crete M.D.   On: 02/11/2018 01:08   Dg Chest Port 1 View  Result Date: 02/11/2018 CLINICAL DATA:  Shortness of breath, productive cough. History of COPD. EXAM:  PORTABLE CHEST 1 VIEW COMPARISON:  Chest radiograph December 02, 2017 FINDINGS: Cardiomediastinal silhouette is normal. Calcified aortic arch. Bronchitic changes. Bibasilar strandy densities most apparent at costophrenic angles. No pleural effusions or focal consolidations. Trachea projects midline and there is no pneumothorax. Soft tissue planes and included osseous structures are non-suspicious. IMPRESSION: 1. Bronchitic changes without focal consolidation. Bibasilar atelectasis. 2. Aortic Atherosclerosis (ICD10-I70.0). Electronically Signed   By: Elon Alas M.D.   On: 02/11/2018 00:14      Allergies as of 02/12/2018   No Known Allergies     Medication List    TAKE these medications   AMBULATORY NON FORMULARY MEDICATION Medication Name: Aero chamber use as directed. DX:J45.909   budesonide-formoterol 160-4.5 MCG/ACT inhaler Commonly known as:  SYMBICORT Inhale 2 puffs into the lungs 2 (two) times daily.   carvedilol 12.5 MG tablet Commonly known as:  COREG Take 1 tablet (12.5 mg total) by mouth 2 (two) times daily with a meal.   doxycycline 100 MG tablet Commonly known as:  VIBRA-TABS Take 1 tablet (100 mg total) by mouth every 12 (twelve) hours for 3 days.   empagliflozin 25 MG Tabs tablet Commonly known as:  JARDIANCE Take 25 mg by mouth daily.   levalbuterol 1.25 MG/3ML nebulizer solution Commonly known as:  XOPENEX Take 1.25 mg by nebulization every 6 (six) hours as needed for wheezing. DX:J44.9   losartan 50 MG tablet Commonly known as:  COZAAR Take 1 tablet (50 mg total) by mouth daily.   metFORMIN 1000 MG tablet Commonly known as:  GLUCOPHAGE Take 1 tablet (1,000 mg total) by mouth 2 (two) times daily with a meal.   montelukast 10 MG tablet Commonly known as:  SINGULAIR Take 1 tablet (10 mg total) by mouth at bedtime.   predniSONE 10 MG tablet Commonly known as:  DELTASONE Take 4 tablets (40 mg total) by mouth daily with breakfast for 3 days.    PRESCRIPTION MEDICATION **Non-US market drug** Zyrova C (rosuvastatin/clopidigrel) Take 1 capsule by mouth every evening        Management plans discussed with the patient and she is in agreement. Stable for discharge home  Patient should follow up with pcp  CODE STATUS:     Code Status Orders  (From admission, onward)         Start     Ordered   02/11/18 0446  Full code  Continuous     02/11/18 0445        Code Status History    Date Active Date Inactive Code Status Order ID Comments User Context   12/02/2017 2312 12/05/2017 1753 Full Code [id number]  Amelia Jo, MD ED   09/26/2017 0131 09/29/2017 1501 Full Code [id number]  Lance Coon, MD Inpatient      TOTAL TIME TAKING CARE OF THIS PATIENT: 38 minutes.  Note: This dictation was prepared with Dragon dictation along with smaller phrase technology. Any transcriptional errors that result from this process are unintentional.  Dayshaun Whobrey M.D on 02/12/2018 at 10:39 AM  Between 7am to 6pm - Pager - [phone] After 6pm go to [email or web address] - password [email or web address] [facility]  Office  [phone]  CC: Primary care physician; [name], MD

## 2018-02-12 NOTE — Progress Notes (Addendum)
Inpatient Diabetes Program Recommendations  AACE/ADA: New Consensus Statement on Inpatient Glycemic Control (2015)  Target Ranges:  Prepandial:   less than 140 mg/dL      Peak postprandial:   less than 180 mg/dL (1-2 hours)      Critically ill patients:  140 - 180 mg/dL   Lab Results  Component Value Date   GLUCAP 309 (H) 02/12/2018   HGBA1C 8.8 (A) 01/14/2018    Review of Glycemic ControlResults for Kuck, Margert "ILA" (MRN [id number]) as of 02/12/2018 09:41  Ref. Range 02/11/2018 12:05 02/11/2018 16:34 02/11/2018 20:59 02/12/2018 08:11  Glucose-Capillary Latest Ref Range: 70 - 99 mg/dL 407 (H) 397 (H) 336 (H) 309 (H)    Diabetes history:  Outpatient Diabetes medications: Jardiance 25 mg daily, Metformin 1000 mg bid with meals Current orders for Inpatient glycemic control:  Lantus 10 units daily, Novolog moderate tid with meals and HS, Invokana 100 mg daily-Prednisone 40 mg daily Inpatient Diabetes Program Recommendations:   Please consider increasing Lantus to 15 units.  Also consider adding Novolog 4 units tid with meals (hold if patient eats less than 50%). Please hold Invokana while patient is in the hospital.   Thanks,  Adah Perl, RN, BC-ADM Inpatient Diabetes Coordinator Pager [phone]

## 2018-02-13 ENCOUNTER — Telehealth: Payer: Self-pay

## 2018-02-13 MED ORDER — INSULIN PEN NEEDLE 32G X 6 MM MISC: 3 refills | Status: DC

## 2018-02-13 NOTE — Telephone Encounter (Signed)
Transition Care Management Follow-up Telephone Call  Date of discharge and from where: [facility] on 02/12/18.  How have you been since you were released from the hospital? Doing ok, does have some wheezing when walking. Still coughing but it is sounding better. No other s/s. Diabetes is running high. This morning it was 332, but daughter states know that is due to the steroid.  Any questions or concerns? Daughter would like to know when home health will be coming out to see pt. Does that order need to be placed? Also, with diabetes running high should they increase the Metformin dose? Pt received new insulin pen but did not receive any needles. She would like PCP to send in a prescription for these to the [facility] on file.  Items Reviewed:  Did the pt receive and understand the discharge instructions provided? Yes   Medications obtained and verified? No, daughter states she will verify these at her f/u apt.  Any new allergies since your discharge? No   Dietary orders reviewed? Yes  Do you have support at home? Yes   Other (ie: DME, Home Health, etc) Home health, see concern.  Functional Questionnaire: (I = Independent and D = Dependent) ADL's: Uses a nebulizer, inhaler and a walker.  Bathing/Dressing- I   Meal Prep- D  Eating- I  Maintaining continence- I  Transferring/Ambulation- Uses a walker prn for walking.  Managing Meds- I, but daughter has been helping with new meds.    Follow up appointments reviewed:    PCP Hospital f/u appt confirmed? Yes  Scheduled to see Dr Brita Romp on 02/24/18 @ 8:20 AM.  [facility] f/u appt confirmed? No, daughter has to r/s the apt with the pulmonologist today.  Are transportation arrangements needed? No   If their condition worsens, is the pt aware to call  their PCP or go to the ED? Yes  Was the patient provided with contact information for the PCP's office or ED? Yes  Was the pt encouraged to call back with questions or  concerns? Yes

## 2018-02-13 NOTE — Telephone Encounter (Signed)
Prescription for pen needles sent to the pharmacy.  She should be taking metformin 1000 mg twice daily which is the max dose of metformin.  Home health should have been ordered by the hospitalist.  Not sure where to find this in the chart, however.  I am happy to order it if it was not ordered though.  [name], MD, MPH [facility] 02/13/2018 11:28 AM

## 2018-02-13 NOTE — Telephone Encounter (Signed)
Patient's daughter advised. Daughter changed hospital follow up appointment to 02/24/18.

## 2018-02-13 NOTE — Telephone Encounter (Signed)
LMTCB

## 2018-02-19 ENCOUNTER — Inpatient Hospital Stay: Payer: Medicaid Other | Admitting: Family Medicine

## 2018-02-19 ENCOUNTER — Inpatient Hospital Stay: Payer: Medicaid Other | Admitting: Internal Medicine

## 2018-02-20 ENCOUNTER — Ambulatory Visit: Payer: Medicaid Other | Admitting: Oncology

## 2018-02-24 ENCOUNTER — Inpatient Hospital Stay: Payer: Medicaid Other | Admitting: Family Medicine

## 2018-02-24 NOTE — Progress Notes (Signed)
* [facility]     Assessment and Plan:  Asthma/chronic bronchitis with multiple exacerbations over the last few months. -Severe persistent asthma at high risk of comp locations, hospitalization. - Elevated eosinophil count of 300. - We will see if the patient is a candidate for Nucala. - Continue triple inhaler with spacer, current prescription is from [place],  asked to use nebulizers 3 times daily.  --Continue Singulair. - We will start theophylline, as the patient has taken them in the past and felt that it helped with her breathing.  Chronic bronchitis/COPD with multiple exacerbations. - We will start azithromycin 250 mg every Monday Wednesday Friday.  Dyspnea on exertion. - Secondary to above, continue to advance activity as tolerated.   Meds ordered this encounter  Medications  . theophylline (THEODUR) 200 MG 12 hr tablet    Sig: Take 1 tablet (200 mg total) by mouth 2 (two) times daily.    Dispense:  60 tablet    Refill:  2  . azithromycin (ZITHROMAX) 250 MG tablet    Sig: Take 1 tablet (250 mg total) by mouth daily. Take one tablet every Monday, Wednesday, Friday    Dispense:  13 tablet    Refill:  5   No follow-ups on file.   Date: 02/24/2018  MRN# [id number] Whitney Fleming [birth year]   Whitney Fleming is a 72 y.o. old female seen in follow up for chief complaint of  Chief Complaint  Patient presents with  . Hospitalization Follow-up    sob with exertion, cough and wheezing.   HPI:   The patient is a 72 yo. female with a history of asthma, TB, HTN, DM,  admitted to the hospital for 4 days in April 2019 for AE asthma.   She is from the state of [place], however she is now living with her family here in [place]. She has had multiple exacerbations of her asthma, most recently she was admitted to the hospital for acute asthma/COPD exacerbation and discharged on 02/12/2018.  At her last visit here she was noted to have poor inhaler technique, therefore  she was started on a spacer.  Currently she is taking a Trelegy inhaler equivalent daily.  She feels that her breathing is still short after getting out of the hospital recently.  She is taking her trilogy inhaler, nebulizer, Singulair as prescribed.  She continues to feel chest tightness, she would like a medication that will keep her out of the hospital help with her breathing, she would hope to avoid injections and wants pills.  **IgE 12/24/17; IGe 5.  **CXR 11/05/17; changes of chronic bronchitis, right heart enlargement.  **CBC 02/11/18>> absolute eosinophil count 800. **CBC 09/25/17; Abs eos count 300.    Medication:    Current Outpatient Medications:  .  AMBULATORY NON FORMULARY MEDICATION, Medication Name: Aero chamber use as directed. 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  budesonide-formoterol (SYMBICORT) 160-4.5 MCG/ACT inhaler, Inhale 2 puffs into the lungs 2 (two) times daily., Disp: 1 Inhaler, Rfl: 6 .  carvedilol (COREG) 12.5 MG tablet, Take 1 tablet (12.5 mg total) by mouth 2 (two) times daily with a meal., Disp: 60 tablet, Rfl: 3 .  empagliflozin (JARDIANCE) 25 MG TABS tablet, Take 25 mg by mouth daily., Disp: 30 tablet, Rfl: 2 .  Insulin Glargine (LANTUS SOLOSTAR) 100 UNIT/ML Solostar Pen, Inject 10 Units into the skin at bedtime., Disp: 2 pen, Rfl: 0 .  Insulin Pen Needle (NOVOFINE) 32G X 6 MM MISC, Use as directed to  inject insulin daily, Disp: 100 each, Rfl: 3 .  levalbuterol (XOPENEX) 1.25 MG/3ML nebulizer solution, Take 1.25 mg by nebulization every 6 (six) hours as needed for wheezing. DX:J44.9, Disp: 216 mL, Rfl: 5 .  losartan (COZAAR) 50 MG tablet, Take 1 tablet (50 mg total) by mouth daily., Disp: 90 tablet, Rfl: 3 .  metFORMIN (GLUCOPHAGE) 1000 MG tablet, Take 1 tablet (1,000 mg total) by mouth 2 (two) times daily with a meal., Disp: 180 tablet, Rfl: 3 .  montelukast (SINGULAIR) 10 MG tablet, Take 1 tablet (10 mg total) by mouth at bedtime., Disp: 30 tablet, Rfl: 3 .   PRESCRIPTION MEDICATION, **Non-US market drug** Zyrova C (rosuvastatin/clopidigrel) Take 1 capsule by mouth every evening, Disp: , Rfl:    Allergies:  Patient has no known allergies.   Review of Systems  Constitutional: Positive for chills and malaise/fatigue. Negative for fever.  HENT: Negative for hearing loss and tinnitus.   Eyes: Negative for blurred vision and double vision.  Respiratory: Positive for cough, shortness of breath and wheezing.   Cardiovascular: Negative for chest pain and palpitations.  Gastrointestinal: Negative for heartburn and nausea.  Genitourinary: Negative for dysuria and urgency.  Musculoskeletal: Negative for myalgias and neck pain.  Skin: Negative for itching and rash.  Neurological: Negative for dizziness and headaches.  Endo/Heme/Allergies: Negative for environmental allergies. Does not bruise/bleed easily.  Psychiatric/Behavioral: Negative for memory loss. The patient does not have insomnia.     Physical Examination:   VS: BP 128/72 (BP Location: Left Arm, Cuff Size: Large)   Pulse 89   Resp 16   Ht 4\' 10"  (1.473 m)   Wt 170 lb (77.1 kg)   SpO2 93%   BMI 35.53 kg/m   General Appearance: No distress  Neuro:without focal findings, mental status, speech normal, alert and oriented HEENT: PERRLA, EOM intact Pulmonary: Bilateral scattered wheezing. CardiovascularNormal S1,S2.  No m/r/g.  Abdomen: Benign, Soft, non-tender, No masses Renal:  No costovertebral tenderness  GU:  No performed at this time. Endoc: No evident thyromegaly, no signs of acromegaly or Cushing features Skin:   warm, no rashes, no ecchymosis  Extremities: normal, no cyanosis, clubbing.     LABORATORY PANEL:   CBC No results for input(s): WBC, HGB, HCT, PLT in the last 168 hours. ------------------------------------------------------------------------------------------------------------------  Chemistries  No results for input(s): NA, K, CL, CO2, GLUCOSE, BUN,  CREATININE, CALCIUM, MG, AST, ALT, ALKPHOS, BILITOT in the last 168 hours.  Invalid input(s): GFRCGP ------------------------------------------------------------------------------------------------------------------  Cardiac Enzymes No results for input(s): TROPONINI in the last 168 hours. ------------------------------------------------------------  RADIOLOGY:   No results found for this or any previous visit. Results for orders placed during the hospital encounter of 11/05/17  DG Chest 2 View   Narrative CLINICAL DATA:  Worsening shortness of breath since last night. History of COPD with current symptoms not responsive to medication. Minimally productive cough.  EXAM: CHEST - 2 VIEW  COMPARISON:  Portable chest x-ray of September 25, 2017  FINDINGS: The lungs are adequately inflated. The interstitial markings are coarse. There is no alveolar infiltrate. There is no pleural effusion. The heart is top-normal in size. The pulmonary vascularity is prominent centrally on the right but stable. There is calcification in the wall of the aortic arch. The bony thorax is unremarkable.  IMPRESSION: Chronic bronchitic changes with possible superimposed acute bronchitis. No alveolar pneumonia nor CHF.  Thoracic aortic atherosclerosis.   Electronically Signed   By: David  Martinique M.D.   On: 11/05/2017 17:01    ------------------------------------------------------------------------------------------------------------------  Thank  you for allowing [facility] to assist in the care of your patient. Our recommendations are noted above.  Please contact us if we can be of further service.   Marda Stalker, M.D., F.C.C.P.  Board Certified in Internal Medicine, Pulmonary Medicine, [place], and Sleep Medicine.  [facility] Office Number: [phone]  02/24/2018

## 2018-02-24 NOTE — Progress Notes (Deleted)
       Patient: Whitney Fleming Female    DOB: 1945-07-12   72 y.o.   MRN: [id number] Visit Date: 02/24/2018  Today's Provider: Lavon Paganini, MD   No chief complaint on file.  Subjective:    I, Tiburcio Pea, CMA, am acting as a scribe for Lavon Paganini, MD.   HPI  Follow up Hospitalization  Patient was admitted to [facility] on 02/10/2018 and discharged on 02/12/2018. She was treated for Acute Hypoxemic Respiratory Failure. Treatment for this included Doxycycline and Prednisone. Telephone follow up was done on 02/13/2018 She reports compliance with treatment. She reports this condition is {improved/worse/unchanged:[id number]}.  ------------------------------------------------------------------------------------       No Known Allergies   Current Outpatient Medications:  .  AMBULATORY NON FORMULARY MEDICATION, Medication Name: Aero chamber use as directed. 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  budesonide-formoterol (SYMBICORT) 160-4.5 MCG/ACT inhaler, Inhale 2 puffs into the lungs 2 (two) times daily., Disp: 1 Inhaler, Rfl: 6 .  carvedilol (COREG) 12.5 MG tablet, Take 1 tablet (12.5 mg total) by mouth 2 (two) times daily with a meal., Disp: 60 tablet, Rfl: 3 .  empagliflozin (JARDIANCE) 25 MG TABS tablet, Take 25 mg by mouth daily., Disp: 30 tablet, Rfl: 2 .  Insulin Glargine (LANTUS SOLOSTAR) 100 UNIT/ML Solostar Pen, Inject 10 Units into the skin at bedtime., Disp: 2 pen, Rfl: 0 .  Insulin Pen Needle (NOVOFINE) 32G X 6 MM MISC, Use as directed to inject insulin daily, Disp: 100 each, Rfl: 3 .  levalbuterol (XOPENEX) 1.25 MG/3ML nebulizer solution, Take 1.25 mg by nebulization every 6 (six) hours as needed for wheezing. DX:J44.9, Disp: 216 mL, Rfl: 5 .  losartan (COZAAR) 50 MG tablet, Take 1 tablet (50 mg total) by mouth daily., Disp: 90 tablet, Rfl: 3 .  metFORMIN (GLUCOPHAGE) 1000 MG tablet, Take 1 tablet (1,000 mg total) by mouth 2 (two) times daily with a meal., Disp: 180 tablet,  Rfl: 3 .  montelukast (SINGULAIR) 10 MG tablet, Take 1 tablet (10 mg total) by mouth at bedtime., Disp: 30 tablet, Rfl: 3 .  PRESCRIPTION MEDICATION, **Non-US market drug** Zyrova C (rosuvastatin/clopidigrel) Take 1 capsule by mouth every evening, Disp: , Rfl:   Review of Systems  Constitutional: Negative.   Respiratory: Negative.   Cardiovascular: Negative.   Musculoskeletal: Negative.     Social History   Tobacco Use  . Smoking status: Never Smoker  . Smokeless tobacco: Never Used  Substance Use Topics  . Alcohol use: Never    Frequency: Never   Objective:   There were no vitals taken for this visit. There were no vitals filed for this visit.   Physical Exam      Assessment & Plan:           Lavon Paganini, MD  [facility]

## 2018-02-25 ENCOUNTER — Inpatient Hospital Stay: Payer: Medicaid Other | Attending: Oncology | Admitting: Oncology

## 2018-02-25 ENCOUNTER — Ambulatory Visit (INDEPENDENT_AMBULATORY_CARE_PROVIDER_SITE_OTHER): Payer: Medicaid Other | Admitting: Internal Medicine

## 2018-02-25 ENCOUNTER — Encounter: Payer: Self-pay | Admitting: Oncology

## 2018-02-25 ENCOUNTER — Encounter: Payer: Self-pay | Admitting: Internal Medicine

## 2018-02-25 VITALS — BP 128/72 | HR 89 | Resp 16 | Ht <= 58 in | Wt 170.0 lb

## 2018-02-25 VITALS — BP 107/72 | HR 80 | Temp 97.9°F | Resp 20 | Ht <= 58 in | Wt 170.9 lb

## 2018-02-25 DIAGNOSIS — Z23 Encounter for immunization: Secondary | ICD-10-CM | POA: Diagnosis not present

## 2018-02-25 DIAGNOSIS — D649 Anemia, unspecified: Secondary | ICD-10-CM | POA: Diagnosis not present

## 2018-02-25 DIAGNOSIS — Z79899 Other long term (current) drug therapy: Secondary | ICD-10-CM | POA: Insufficient documentation

## 2018-02-25 DIAGNOSIS — J45909 Unspecified asthma, uncomplicated: Secondary | ICD-10-CM | POA: Insufficient documentation

## 2018-02-25 DIAGNOSIS — Z794 Long term (current) use of insulin: Secondary | ICD-10-CM | POA: Diagnosis not present

## 2018-02-25 DIAGNOSIS — J4550 Severe persistent asthma, uncomplicated: Secondary | ICD-10-CM | POA: Diagnosis not present

## 2018-02-25 DIAGNOSIS — E785 Hyperlipidemia, unspecified: Secondary | ICD-10-CM | POA: Insufficient documentation

## 2018-02-25 DIAGNOSIS — D638 Anemia in other chronic diseases classified elsewhere: Secondary | ICD-10-CM

## 2018-02-25 DIAGNOSIS — I10 Essential (primary) hypertension: Secondary | ICD-10-CM | POA: Insufficient documentation

## 2018-02-25 DIAGNOSIS — E119 Type 2 diabetes mellitus without complications: Secondary | ICD-10-CM | POA: Insufficient documentation

## 2018-02-25 DIAGNOSIS — R0609 Other forms of dyspnea: Secondary | ICD-10-CM | POA: Diagnosis not present

## 2018-02-25 MED ORDER — THEOPHYLLINE ER 200 MG PO TB12: 200.0000 mg | ORAL_TABLET | Freq: Two times a day (BID) | ORAL | 2 refills | Status: DC

## 2018-02-25 MED ORDER — AZITHROMYCIN 250 MG PO TABS: 250.0000 mg | ORAL_TABLET | Freq: Every day | ORAL | 5 refills | Status: DC

## 2018-02-25 NOTE — Patient Instructions (Addendum)
Will start theophylline twice daily.  Will start azithromycin 250 mg every Monday, Wednesday, Friday.  Will five flu vaccine today.

## 2018-02-25 NOTE — Progress Notes (Signed)
Here as hosp. F/u and labs results.

## 2018-02-27 NOTE — Progress Notes (Signed)
Hematology/Oncology Consult note [facility]  [phone] [phone]  Patient Care Team: [name], MD as PCP - General (Family Medicine)   Name of the patient: Whitney Fleming  [id number]  Mar 01, 1946   Date of visit: 02/27/18  Diagnosis-normocytic anemia likely anemia of chronic disease  Chief complaint/ Reason for visit-discuss results of blood work  Heme/Onc history: patient is a 72 year old female with a past medical history significant for hypertension, hyperlipidemia diabetes and asthma.  She has been referred to [place] for evaluation of anemia.  Patient's baseline hemoglobin has been around 11-12 this year.  However the most recent hemoglobin on 01/17/2018 was 9.4/28.1.  White count was mildly elevated at 11.1 and platelets were normal at 410.  Of note she has had mild microcytosis and her MCV ranges between 78-81.  Recent B12 and folate were within normal limits.  Patient has had intermittent hyperkalemia in the past.  Iron studies from August 2019 showed iron saturation of 90%.  Serum iron was normal at 58 and TIBC normal at 303.  Last hemoglobin A1c in April 2019 was elevated at 8.1.  Patient has chronic COPD and follows up with pulmonary for the same.  She has been on and off steroids and often gets steroid-induced hyperglycemia  Results of blood work from 02/05/2018 were as follows: CBC showed white count of 11.5, H&H of 11.5/34.1 with an MCV of 82.8 and a platelet count of 185.  Her hemoglobin actually improved from 9.4-11.5 from a month prior.  CMP showed mildly elevated creatinine of 1.17.  Blood sugars were elevated at 410.  Multiple myeloma panel revealed IgG kappa monoclonal protein of 0.3 g.  Kappa lambda light chain ratio was normal at 1.12.  Reticulocyte count was normal at 2.4.  Smear review was unremarkable.  Haptoglobin levels were elevated at 233.  ESR was elevated at 36.  Ferritin was normal at 80.  TSH was normal at  1.9.   Interval history-patient is [place] speaking and is here with her grandson today.  She declines an interpreter but is able to understand him the which I can converse with as well  patient was recently discharged from the hospital for another episode of COPD exacerbation and was discharged on steroids and antibiotics.  She is currently off steroids.  Reports that his shortness of breath is is at her baseline.  She mainly walks around the house but is unable to do much outside the house  ECOG PS- 2 Pain scale- 0 Opioid associated constipation- no  Review of systems- Review of Systems  Constitutional: Positive for malaise/fatigue. Negative for chills, fever and weight loss.  HENT: Negative for congestion, ear discharge and nosebleeds.   Eyes: Negative for blurred vision.  Respiratory: Positive for shortness of breath. Negative for cough, hemoptysis, sputum production and wheezing.   Cardiovascular: Negative for chest pain, palpitations, orthopnea and claudication.  Gastrointestinal: Negative for abdominal pain, blood in stool, constipation, diarrhea, heartburn, melena, nausea and vomiting.  Genitourinary: Negative for dysuria, flank pain, frequency, hematuria and urgency.  Musculoskeletal: Negative for back pain, joint pain and myalgias.  Skin: Negative for rash.  Neurological: Negative for dizziness, tingling, focal weakness, seizures, weakness and headaches.  Endo/Heme/Allergies: Does not bruise/bleed easily.  Psychiatric/Behavioral: Negative for depression and suicidal ideas. The patient does not have insomnia.       No Known Allergies   Past Medical History:  Diagnosis Date  . Anemia    normocytic anemia  . Asthma   .  Cardiomegaly   . Diabetes mellitus without complication ([place])   . Hypertension   . Hyponatremia      Past Surgical History:  Procedure Laterality Date  . TUBAL LIGATION      Social History   Socioeconomic History  . Marital status: Married     Spouse name: Not on file  . Number of children: 4  . Years of education: Not on file  . Highest education level: Not on file  Occupational History  . Occupation: homemaker  Social Needs  . Financial resource strain: Not on file  . Food insecurity:    Worry: Not on file    Inability: Not on file  . Transportation needs:    Medical: Not on file    Non-medical: Not on file  Tobacco Use  . Smoking status: Never Smoker  . Smokeless tobacco: Never Used  Substance and Sexual Activity  . Alcohol use: Never    Frequency: Never  . Drug use: Never  . Sexual activity: Not Currently  Lifestyle  . Physical activity:    Days per week: Not on file    Minutes per session: Not on file  . Stress: Not on file  Relationships  . Social connections:    Talks on phone: Not on file    Gets together: Not on file    Attends religious service: Not on file    Active member of club or organization: Not on file    Attends meetings of clubs or organizations: Not on file    Relationship status: Not on file  . Intimate partner violence:    Fear of current or ex partner: Not on file    Emotionally abused: Not on file    Physically abused: Not on file    Forced sexual activity: Not on file  Other Topics Concern  . Not on file  Social History Narrative  . Not on file    Family History  Problem Relation Age of Onset  . Tuberculosis Other   . Healthy Mother   . Heart disease Father   . Asthma Father   . Cancer Neg Hx      Current Outpatient Medications:  .  AMBULATORY NON FORMULARY MEDICATION, Medication Name: Aero chamber use as directed. 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  budesonide-formoterol (SYMBICORT) 160-4.5 MCG/ACT inhaler, Inhale 2 puffs into the lungs 2 (two) times daily., Disp: 1 Inhaler, Rfl: 6 .  carvedilol (COREG) 12.5 MG tablet, Take 1 tablet (12.5 mg total) by mouth 2 (two) times daily with a meal., Disp: 60 tablet, Rfl: 3 .  empagliflozin (JARDIANCE) 25 MG TABS tablet, Take 25  mg by mouth daily., Disp: 30 tablet, Rfl: 2 .  glipiZIDE (GLUCOTROL) 5 MG tablet, Take 5 mg by mouth 2 (two) times daily., Disp: , Rfl:  .  Insulin Glargine (LANTUS SOLOSTAR) 100 UNIT/ML Solostar Pen, Inject 10 Units into the skin at bedtime., Disp: 2 pen, Rfl: 0 .  Insulin Pen Needle (NOVOFINE) 32G X 6 MM MISC, Use as directed to inject insulin daily, Disp: 100 each, Rfl: 3 .  levalbuterol (XOPENEX) 1.25 MG/3ML nebulizer solution, Take 1.25 mg by nebulization every 6 (six) hours as needed for wheezing. DX:J44.9, Disp: 216 mL, Rfl: 5 .  losartan (COZAAR) 50 MG tablet, Take 1 tablet (50 mg total) by mouth daily., Disp: 90 tablet, Rfl: 3 .  metFORMIN (GLUCOPHAGE) 1000 MG tablet, Take 1 tablet (1,000 mg total) by mouth 2 (two) times daily with a meal., Disp:  180 tablet, Rfl: 3 .  montelukast (SINGULAIR) 10 MG tablet, Take 1 tablet (10 mg total) by mouth at bedtime., Disp: 30 tablet, Rfl: 3 .  PRESCRIPTION MEDICATION, **Non-US market drug** Zyrova C (rosuvastatin/clopidigrel) Take 1 capsule by mouth every evening, Disp: , Rfl:  .  theophylline (THEODUR) 200 MG 12 hr tablet, Take 1 tablet (200 mg total) by mouth 2 (two) times daily., Disp: 60 tablet, Rfl: 2 .  azithromycin (ZITHROMAX) 250 MG tablet, Take 1 tablet (250 mg total) by mouth daily. Take one tablet every Monday, Wednesday, Friday, Disp: 13 tablet, Rfl: 5  Physical exam:  Vitals:   02/25/18 1416  BP: 107/72  Pulse: 80  Resp: 20  Temp: 97.9 F (36.6 C)  TempSrc: Tympanic  Weight: 170 lb 14.4 oz (77.5 kg)  Height: _0  (1.473 m)   Physical Exam  Constitutional: She is oriented to person, place, and time. She appears well-developed and well-nourished.  HENT:  Head: Normocephalic and atraumatic.  Eyes: Pupils are equal, round, and reactive to light. EOM are normal.  Neck: Normal range of motion.  Cardiovascular: Normal rate, regular rhythm and normal heart sounds.  Pulmonary/Chest: Effort normal and breath sounds normal.   Abdominal: Soft. Bowel sounds are normal.  Neurological: She is alert and oriented to person, place, and time.  Skin: Skin is warm and dry.     CMP Latest Ref Rng & Units 02/12/2018  Glucose 70 - 99 mg/dL 305(H)  BUN 8 - 23 mg/dL 24(H)  Creatinine 0.44 - 1.00 mg/dL 0.88  Sodium 135 - 145 mmol/L 135  Potassium 3.5 - 5.1 mmol/L 4.8  Chloride 98 - 111 mmol/L 101  CO2 22 - 32 mmol/L 21(L)  Calcium 8.9 - 10.3 mg/dL 8.8(L)  Total Protein 6.5 - 8.1 g/dL -  Total Bilirubin 0.3 - 1.2 mg/dL -  Alkaline Phos 38 - 126 U/L -  AST 15 - 41 U/L -  ALT 0 - 44 U/L -   CBC Latest Ref Rng & Units 02/11/2018  WBC 3.6 - 11.0 K/uL 11.7(H)  Hemoglobin 12.0 - 16.0 g/dL 11.8(L)  Hematocrit 35.0 - 47.0 % 36.0  Platelets 150 - 440 K/uL 261    No images are attached to the encounter.  Ct Angio Chest Pe W And/or Wo Contrast  Result Date: 02/11/2018 CLINICAL DATA:  72 year old female with shortness of breath. Chest pain. EXAM: CT ANGIOGRAPHY CHEST WITH CONTRAST TECHNIQUE: Multidetector CT imaging of the chest was performed using the standard protocol during bolus administration of intravenous contrast. Multiplanar CT image reconstructions and MIPs were obtained to evaluate the vascular anatomy. CONTRAST:  61m OMNIPAQUE IOHEXOL 350 MG/ML SOLN COMPARISON:  Chest radiograph dated 02/28/2018 and CT dated 12/23/2012 FINDINGS: Cardiovascular: There is no cardiomegaly or pericardial effusion. Coronary vascular calcification noted. There is moderate atherosclerotic calcification of the thoracic aorta. No aneurysmal dilatation or dissection. The origins of the great vessels of the aortic arch are patent. Evaluation of the pulmonary arteries is limited due to respiratory motion artifact. No definite large or central pulmonary artery embolus identified. Nonocclusive apparent linear hypodensity in the periphery of a right lower lobe subsegmental pulmonary artery branch (series 5, image 139) is most likely artifactual. This is  less likely related to an old infarct/scarring. Evaluation of this area is limited due to motion artifact. Mediastinum/Nodes: There is no hilar or mediastinal adenopathy. Esophagus and the thyroid gland are grossly unremarkable. No mediastinal fluid collection. Calcified granuloma noted in the mediastinum. Lungs/Pleura: There is bibasilar subpleural interstitial  coarsening/fibrosis. No focal consolidation, pleural effusion, or pneumothorax. The central airways are patent. Upper Abdomen: There is a small stone within the gallbladder. No pericholecystic fluid. The visualized upper abdomen is otherwise unremarkable. Musculoskeletal: Age indeterminate L1 compression fracture, likely old. Clinical correlation is recommended. No acute fracture. Review of the MIP images confirms the above findings. IMPRESSION: 1. No acute intrathoracic pathology. No CT evidence of central pulmonary artery embolus. 2. Bibasilar interstitial coarsening/fibrosis. 3.  Aortic Atherosclerosis (ICD10-I70.0). Electronically Signed   By: Anner Crete M.D.   On: 02/11/2018 01:08   Dg Chest Port 1 View  Result Date: 02/11/2018 CLINICAL DATA:  Shortness of breath, productive cough. History of COPD. EXAM: PORTABLE CHEST 1 VIEW COMPARISON:  Chest radiograph December 02, 2017 FINDINGS: Cardiomediastinal silhouette is normal. Calcified aortic arch. Bronchitic changes. Bibasilar strandy densities most apparent at costophrenic angles. No pleural effusions or focal consolidations. Trachea projects midline and there is no pneumothorax. Soft tissue planes and included osseous structures are non-suspicious. IMPRESSION: 1. Bronchitic changes without focal consolidation. Bibasilar atelectasis. 2. Aortic Atherosclerosis (ICD10-I70.0). Electronically Signed   By: Elon Alas M.D.   On: 02/11/2018 00:14     Assessment and plan- Patient is a 72 y.o. female with normocytic anemia likely anemia of chronic disease  I have reviewed the results of the blood  work with the patient and her grandson today.  Overall anemia work-up shows that her hemoglobin is improved from 9.4-11.1.  She has mild normocytic anemia and iron studies B12 and folate are normal.  No evidence of hemolysis.  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  I suspect that her anemia is due to chronic disease including underlying asthma and uncontrolled steroid-induced hyperglycemia.  I will see her back in 3 months with a CBC with diff   Visit Diagnosis 1. Normocytic anemia   2. Anemia of chronic disease      Dr. Randa Evens, MD, MPH [facility] [phone] 02/27/2018 8:29 AM

## 2018-03-03 ENCOUNTER — Ambulatory Visit (INDEPENDENT_AMBULATORY_CARE_PROVIDER_SITE_OTHER): Payer: Medicaid Other | Admitting: Family Medicine

## 2018-03-03 ENCOUNTER — Encounter: Payer: Self-pay | Admitting: Family Medicine

## 2018-03-03 VITALS — BP 108/64 | HR 86 | Temp 97.8°F | Wt 171.2 lb

## 2018-03-03 DIAGNOSIS — J449 Chronic obstructive pulmonary disease, unspecified: Secondary | ICD-10-CM | POA: Diagnosis not present

## 2018-03-03 DIAGNOSIS — E1121 Type 2 diabetes mellitus with diabetic nephropathy: Secondary | ICD-10-CM

## 2018-03-03 DIAGNOSIS — J9601 Acute respiratory failure with hypoxia: Secondary | ICD-10-CM | POA: Diagnosis not present

## 2018-03-03 NOTE — Progress Notes (Signed)
Patient: Whitney Fleming Female    DOB: 04-21-1946   72 y.o.   MRN: [id number] Visit Date: 03/03/2018  Today's Provider: Lavon Paganini, MD   Chief Complaint  Patient presents with  . Hospitalization Follow-up   Subjective:    I, Tiburcio Pea, CMA, am acting as a scribe for Lavon Paganini, MD.   HPI  Follow up Hospitalization  Patient was admitted to [facility] on 02/11/2018 and discharged on 02/12/2018. She was treated for Acute Hypoxemic Respiratory Failure.  Treatment for this included started doxycycline (VIBRA-TABS) Levalbuterol and predniSONE             (DELTASONE)  Telephone follow up was done on 02/13/2018 She reports good compliance with treatment. She reports this condition is Improved.  ------------------------------------------------------------------------------------       No Known Allergies   Current Outpatient Medications:  .  AMBULATORY NON FORMULARY MEDICATION, Medication Name: Aero chamber use as directed. 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  azithromycin (ZITHROMAX) 250 MG tablet, Take 1 tablet (250 mg total) by mouth daily. Take one tablet every Monday, Wednesday, Friday, Disp: 13 tablet, Rfl: 5 .  budesonide-formoterol (SYMBICORT) 160-4.5 MCG/ACT inhaler, Inhale 2 puffs into the lungs 2 (two) times daily., Disp: 1 Inhaler, Rfl: 6 .  carvedilol (COREG) 12.5 MG tablet, Take 1 tablet (12.5 mg total) by mouth 2 (two) times daily with a meal., Disp: 60 tablet, Rfl: 3 .  empagliflozin (JARDIANCE) 25 MG TABS tablet, Take 25 mg by mouth daily., Disp: 30 tablet, Rfl: 2 .  glipiZIDE (GLUCOTROL) 5 MG tablet, Take 5 mg by mouth 2 (two) times daily., Disp: , Rfl:  .  Insulin Pen Needle (NOVOFINE) 32G X 6 MM MISC, Use as directed to inject insulin daily, Disp: 100 each, Rfl: 3 .  levalbuterol (XOPENEX) 1.25 MG/3ML nebulizer solution, Take 1.25 mg by nebulization every 6 (six) hours as needed for wheezing. DX:J44.9, Disp: 216 mL, Rfl: 5 .  losartan (COZAAR) 50 MG tablet,  Take 1 tablet (50 mg total) by mouth daily., Disp: 90 tablet, Rfl: 3 .  metFORMIN (GLUCOPHAGE) 1000 MG tablet, Take 1 tablet (1,000 mg total) by mouth 2 (two) times daily with a meal., Disp: 180 tablet, Rfl: 3 .  montelukast (SINGULAIR) 10 MG tablet, Take 1 tablet (10 mg total) by mouth at bedtime., Disp: 30 tablet, Rfl: 3 .  PRESCRIPTION MEDICATION, **Non-US market drug** Zyrova C (rosuvastatin/clopidigrel) Take 1 capsule by mouth every evening, Disp: , Rfl:  .  theophylline (THEODUR) 200 MG 12 hr tablet, Take 1 tablet (200 mg total) by mouth 2 (two) times daily., Disp: 60 tablet, Rfl: 2 .  Insulin Glargine (LANTUS SOLOSTAR) 100 UNIT/ML Solostar Pen, Inject 10 Units into the skin at bedtime. (Patient not taking: Reported on 03/03/2018), Disp: 2 pen, Rfl: 0   Review of Systems  Constitutional: Negative.   Respiratory: Positive for cough and wheezing.   Cardiovascular: Negative.   Musculoskeletal: Negative.     Social History   Tobacco Use  . Smoking status: Never Smoker  . Smokeless tobacco: Never Used  Substance Use Topics  . Alcohol use: Never    Frequency: Never   Objective:   BP 108/64 (BP Location: Right Arm, Patient Position: Sitting, Cuff Size: Large)   Pulse 86   Temp 97.8 F (36.6 C) (Oral)   Wt 171 lb 3.2 oz (77.7 kg)   BMI 35.78 kg/m  Vitals:   03/03/18 1332  BP: 108/64  Pulse: 86  Temp: 97.8 F (36.6  C)  TempSrc: Oral  Weight: 171 lb 3.2 oz (77.7 kg)     Physical Exam  Constitutional: She is oriented to person, place, and time. She appears well-developed and well-nourished. No distress.  HENT:  Head: Normocephalic and atraumatic.  Mouth/Throat: Oropharynx is clear and moist.  Eyes: Conjunctivae are normal. No scleral icterus.  Neck: Neck supple. No thyromegaly present.  Cardiovascular: Normal rate, regular rhythm, normal heart sounds and intact distal pulses.  No murmur heard. Pulmonary/Chest: Effort normal. No respiratory distress. She has wheezes  (diffusely). She has no rales.  Musculoskeletal: She exhibits no edema.  Lymphadenopathy:    She has no cervical adenopathy.  Neurological: She is alert and oriented to person, place, and time.  Skin: Skin is warm and dry. Capillary refill takes less than 2 seconds. No rash noted.  Psychiatric: She has a normal mood and affect. Her behavior is normal.  Vitals reviewed.       Assessment & Plan:   Problem List Items Addressed This Visit      Respiratory   Acute respiratory failure with hypoxia ([place]) - Primary    Resolved No hypoxia today Continue to monitor, especially when she has exacerbations of asthma/COPD She is followed by [facility] on triple therapy and starting azithromycin and theophylline      COPD (chronic obstructive pulmonary disease) ([place])    COPD/asthma overlap Multiple recent exacerbations She is followed by [facility] on triple therapy and starting azithromycin and theophylline        Endocrine   Diabetes ([place])    Uncontrolled Last A1c 8.8 With significant elevation in blood glucose during asthma/COPD exacerbations when taking prednisone She did require Lantus 10 units nightly in addition to her Jardiance and metformin while she was taking steroids Continue metformin 1000 mg twice daily and Jardiance 25 mg twice daily We can consider GLP-1 in the future Would reserve DPP 4 or sulfonylurea therapy for alternatives given her risk of hypoglycemia with her comorbidities and age In the future, if she is going to be on steroids again, we could consider giving glipizide 5 mg twice daily or Lantus 10 units nightly only while she is taking the prednisone to offset the hyperglycemia They are considering ophthalmology for eye exam, but are not sure No changes to medications currently as her fasting blood glucoses are well controlled Follow-up in about 2 months and recheck A1c Foot exam completed today          Return in about 2 months (around 05/03/2018) for  diabetes f/u.   The entirety of the information documented in the History of Present Illness, Review of Systems and Physical Exam were personally obtained by me. Portions of this information were initially documented by Tiburcio Pea, CMA and reviewed by me for thoroughness and accuracy.    [name], MD, MPH [facility] 03/03/2018 2:28 PM

## 2018-03-03 NOTE — Patient Instructions (Signed)
Continue Jardiance and Metformin  Do not use insulin or glipizide right now We may use one of these in the future if taking steroids

## 2018-03-03 NOTE — Assessment & Plan Note (Signed)
Uncontrolled Last A1c 8.8 With significant elevation in blood glucose during asthma/COPD exacerbations when taking prednisone She did require Lantus 10 units nightly in addition to her Jardiance and metformin while she was taking steroids Continue metformin 1000 mg twice daily and Jardiance 25 mg twice daily We can consider GLP-1 in the future Would reserve DPP 4 or sulfonylurea therapy for alternatives given her risk of hypoglycemia with her comorbidities and age In the future, if she is going to be on steroids again, we could consider giving glipizide 5 mg twice daily or Lantus 10 units nightly only while she is taking the prednisone to offset the hyperglycemia They are considering ophthalmology for eye exam, but are not sure No changes to medications currently as her fasting blood glucoses are well controlled Follow-up in about 2 months and recheck A1c Foot exam completed today

## 2018-03-03 NOTE — Assessment & Plan Note (Signed)
COPD/asthma overlap Multiple recent exacerbations She is followed by [facility] on triple therapy and starting azithromycin and theophylline

## 2018-03-03 NOTE — Assessment & Plan Note (Signed)
Resolved No hypoxia today Continue to monitor, especially when she has exacerbations of asthma/COPD She is followed by [facility] on triple therapy and starting azithromycin and theophylline

## 2018-03-05 ENCOUNTER — Ambulatory Visit: Payer: Medicaid Other | Admitting: Family Medicine

## 2018-03-20 ENCOUNTER — Telehealth: Payer: Self-pay | Admitting: *Deleted

## 2018-03-20 NOTE — Telephone Encounter (Signed)
[employer] has been approved valid 03/16/18--03/11/2019 Auth # [id number] Call ID [id number] EBX:[email or web address]

## 2018-03-26 ENCOUNTER — Telehealth: Payer: Self-pay | Admitting: Internal Medicine

## 2018-03-26 NOTE — Telephone Encounter (Signed)
Called and left message on daughter's #. Need to know if her mother now has Medicare?

## 2018-03-26 NOTE — Telephone Encounter (Signed)
Whitney Fleming from [employer] calling stating in order to approve the application they are in need of Medicare information.   The application was missing some information on it.   Please call back

## 2018-03-27 NOTE — Telephone Encounter (Signed)
Called patient's daughter and advised that she needs to contact Medicaid and find out if her mother has Medicare now. She was not aware and or if she has new coverage. She will call back after she finds out. Nothing further needed at this time.

## 2018-03-31 ENCOUNTER — Telehealth: Payer: Self-pay | Admitting: Primary Care

## 2018-03-31 NOTE — Telephone Encounter (Signed)
Call to schedule palliative care home visit, daughter states patient is doing well and no longer needs palliative services at home.

## 2018-04-01 ENCOUNTER — Telehealth: Payer: Self-pay | Admitting: Internal Medicine

## 2018-04-01 NOTE — Telephone Encounter (Signed)
Please call regarding missing information for Medicare information on enrollment form.

## 2018-04-01 NOTE — Telephone Encounter (Signed)
Returned call to [employer] to [place]. Spoke with Summer and advised that we have no knowledge of patient having Medicare. Asked where this came from,she states informed by Joellen Jersey that patient has Medicare as well. Again insured that spoke with patient's daughter who states "her mother only has Medicaid". Summer will update file. Nothing further needed at this time.

## 2018-04-02 ENCOUNTER — Other Ambulatory Visit: Payer: Self-pay | Admitting: Family Medicine

## 2018-04-14 ENCOUNTER — Telehealth: Payer: Self-pay | Admitting: Family Medicine

## 2018-04-14 DIAGNOSIS — E1165 Type 2 diabetes mellitus with hyperglycemia: Secondary | ICD-10-CM

## 2018-04-14 NOTE — Telephone Encounter (Signed)
Pt is needing cholesterol medication now.  She took the last of what she had from [place]. Pt's daughter forgot to call sooner.  Pt is out.  Please call into:  [place], [place] - [place] [phone] (Phone) [phone] (Fax)    Thanks, [facility]

## 2018-04-14 NOTE — Telephone Encounter (Signed)
Can they look and see what her dose of Rosuvastatin was?  We don't have the doses in the chart.  [name], MD, MPH [facility] 04/14/2018 4:45 PM

## 2018-04-15 NOTE — Telephone Encounter (Signed)
I spoke with the daughter Brya Simerly (on [place]), she states that the patient was on Zyroze medication for her cholesterol in [place] and they want to change it to a different medication that you would recommend her mother to take for cholesterol.

## 2018-04-16 MED ORDER — ROSUVASTATIN CALCIUM 5 MG PO TABS: 5.0000 mg | ORAL_TABLET | Freq: Every day | ORAL | 3 refills | Status: DC

## 2018-04-16 NOTE — Telephone Encounter (Signed)
I know, but I was wondering what mg the rosuvastatin component was (it was a combo pill).  It's ok thouhg, I just went ahead with 5mg  daily  Aashi Derrington, Dionne Bucy, MD, MPH [facility] 04/16/2018 8:17 AM

## 2018-04-17 NOTE — Telephone Encounter (Signed)
Pt's daughter asking about medication that was supposed to be called in for mother-pt. Also asking if her mother could be prescribed BS testing supplies for diabeties?  Please advise.  Thanks, [employer]

## 2018-04-18 MED ORDER — ACCU-CHEK FASTCLIX LANCETS MISC: 3 refills | Status: DC

## 2018-04-18 MED ORDER — ACCU-CHEK NANO SMARTVIEW W/DEVICE KIT: PACK | 0 refills | Status: DC

## 2018-04-18 MED ORDER — GLUCOSE BLOOD VI STRP: ORAL_STRIP | 3 refills | Status: DC

## 2018-04-18 NOTE — Telephone Encounter (Signed)
Blood glucose test kit, strips and lancets sent to pharmacy.  Had already sent in [place], Dionne Bucy, MD, MPH [facility] 04/18/2018 8:39 AM

## 2018-04-18 NOTE — Addendum Note (Signed)
Addended by: [name] on: 04/18/2018 08:39 AM   Modules accepted: Orders

## 2018-04-18 NOTE — Telephone Encounter (Signed)
Patient daughter Malachi Paradise (per DPR) was advised.

## 2018-04-22 ENCOUNTER — Ambulatory Visit: Payer: Medicaid Other | Admitting: Internal Medicine

## 2018-04-22 DIAGNOSIS — E119 Type 2 diabetes mellitus without complications: Secondary | ICD-10-CM | POA: Diagnosis not present

## 2018-04-22 LAB — HM DIABETES EYE EXAM

## 2018-04-24 NOTE — Progress Notes (Signed)
* [facility]     Assessment and Plan:  Asthma/chronic bronchitis with multiple exacerbations over the last few months. -Severe persistent asthma, daily symptoms a high risk of complications. - Elevated eosinophil count of 300. - Patient may be a candidate for Nucala, will initiate that as soon as possible. - We will maintain the patient on Pulmicort and Xopenex both twice daily by nebulizers.   Chronic bronchitis/COPD with multiple exacerbations and numerous hospital admissions. - Continue azithromycin 250 mg every Monday Wednesday Friday.  Dyspnea on exertion. - Secondary to above.  Continue above medications.   Meds ordered this encounter  Medications  . DISCONTD: budesonide (PULMICORT) 0.5 MG/2ML nebulizer solution    Sig: Take 2 mLs (0.5 mg total) by nebulization 2 (two) times daily.    Dispense:  75 mL    Refill:  12  . budesonide (PULMICORT) 0.5 MG/2ML nebulizer solution    Sig: Take 2 mLs (0.5 mg total) by nebulization 2 (two) times daily.    Dispense:  75 mL    Refill:  12   Return in about 3 months (around 07/26/2018).  Greater than 50% of the 40 minute visit was spent in counseling/coordination of care regarding multiple medications, coordination of benefits to get her covered for nucala.     Date: 04/24/2018  MRN# [id number] Whitney Fleming [id number]   Whitney Fleming is a 72 y.o. old female seen in follow up for chief complaint of  Chief Complaint  Patient presents with  . Asthma    Per family patient is doing well. She has medication from [place] that is equilvalent to Symbicort and Levalbuterol   HPI:  The patient is a 72 year old female with severe allergic asthma.  She has had 3 admissions to the hospital this year with asthma exacerbations including most recently in September 2019.  At last visit she was started on theophylline, and was referred to be started on Nucala.  She was continued on her triple inhaler, which she gets from [place],  which appears equivalent to trilogy.  She was also continued on nebulizer, Singulair.  Currently she is taking  symbicort 2 puffs daily.  I reviewed the patient's medications, she has brought the nebulizers that she is taking from [place], these were reviewed with the patient, daughter, husband.  She is taking xopenex once daily, she has medications from [place] that she is taking that appear equivalent to nebulized xopenex/ipratropium and nebulized pulmicort that she takes, both once every 3 days, she notes that she feels better when she takes these. She takes singulair every night.  There are no pets at home.  She is taking azithro every M,W,F and has not been hospitalized again since starting this.   **IgE 12/24/17; IGe 5.  **CXR 11/05/17; changes of chronic bronchitis, right heart enlargement.  **CBC 02/11/18>> absolute eosinophil count 800. **CBC 09/25/17; Abs eos count 300.    Medication:    Current Outpatient Medications:  .  ACCU-CHEK FASTCLIX LANCETS MISC, Use as directed to check blood glucose twice daily, Disp: 100 each, Rfl: 3 .  AMBULATORY NON FORMULARY MEDICATION, Medication Name: Aero chamber use as directed. 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  azithromycin (ZITHROMAX) 250 MG tablet, Take 1 tablet (250 mg total) by mouth daily. Take one tablet every Monday, Wednesday, Friday, Disp: 13 tablet, Rfl: 5 .  Blood Glucose Monitoring Suppl (ACCU-CHEK NANO SMARTVIEW) w/Device KIT, Use as directed to check blood glucose twice daily, Disp: 1 kit, Rfl: 0 .  budesonide-formoterol (  SYMBICORT) 160-4.5 MCG/ACT inhaler, Inhale 2 puffs into the lungs 2 (two) times daily., Disp: 1 Inhaler, Rfl: 6 .  carvedilol (COREG) 12.5 MG tablet, Take 1 tablet (12.5 mg total) by mouth 2 (two) times daily with a meal., Disp: 60 tablet, Rfl: 3 .  glucose blood (ACCU-CHEK SMARTVIEW) test strip, Use as directed to check blood glucose twice daily, Disp: 100 each, Rfl: 3 .  Insulin Pen Needle (NOVOFINE) 32G X 6 MM MISC, Use  as directed to inject insulin daily, Disp: 100 each, Rfl: 3 .  JARDIANCE 25 MG TABS tablet, TAKE 1 TABLET BY MOUTH ONCE DAILY, Disp: 30 tablet, Rfl: 2 .  levalbuterol (XOPENEX) 1.25 MG/3ML nebulizer solution, Take 1.25 mg by nebulization every 6 (six) hours as needed for wheezing. DX:J44.9, Disp: 216 mL, Rfl: 5 .  losartan (COZAAR) 50 MG tablet, Take 1 tablet (50 mg total) by mouth daily., Disp: 90 tablet, Rfl: 3 .  metFORMIN (GLUCOPHAGE) 1000 MG tablet, Take 1 tablet (1,000 mg total) by mouth 2 (two) times daily with a meal., Disp: 180 tablet, Rfl: 3 .  montelukast (SINGULAIR) 10 MG tablet, Take 1 tablet (10 mg total) by mouth at bedtime., Disp: 30 tablet, Rfl: 3 .  PRESCRIPTION MEDICATION, **Non-US market drug** Zyrova C (rosuvastatin/clopidigrel) Take 1 capsule by mouth every evening, Disp: , Rfl:  .  rosuvastatin (CRESTOR) 5 MG tablet, Take 1 tablet (5 mg total) by mouth daily., Disp: 30 tablet, Rfl: 3 .  theophylline (THEODUR) 200 MG 12 hr tablet, Take 1 tablet (200 mg total) by mouth 2 (two) times daily., Disp: 60 tablet, Rfl: 2   Allergies:  Patient has no known allergies.   Review of Systems:  Constitutional: Feels well. Cardiovascular: Denies chest pain, exertional chest pain.  Pulmonary: Denies hemoptysis, pleuritic chest pain.   The remainder of systems were reviewed and were found to be negative other than what is documented in the HPI.    Physical Examination:   VS: BP 112/60 (BP Location: Left Arm, Cuff Size: Large)   Pulse 80   Resp 16   Ht 4' 10" (1.473 m)   Wt 167 lb (75.8 kg)   SpO2 99%   BMI 34.90 kg/m   General Appearance: No distress  Neuro:without focal findings, mental status, speech normal, alert and oriented HEENT: PERRLA, EOM intact Pulmonary: No wheezing, No rales  CardiovascularNormal S1,S2.  No m/r/g.  Abdomen: Benign, Soft, non-tender, No masses Renal:  No costovertebral tenderness  GU:  No performed at this time. Endoc: No evident thyromegaly, no  signs of acromegaly or Cushing features Skin:   warm, no rashes, no ecchymosis  Extremities: normal, no cyanosis, clubbing.    LABORATORY PANEL:   CBC No results for input(s): WBC, HGB, HCT, PLT in the last 168 hours. ------------------------------------------------------------------------------------------------------------------  Chemistries  No results for input(s): NA, K, CL, CO2, GLUCOSE, BUN, CREATININE, CALCIUM, MG, AST, ALT, ALKPHOS, BILITOT in the last 168 hours.  Invalid input(s): GFRCGP ------------------------------------------------------------------------------------------------------------------  Cardiac Enzymes No results for input(s): TROPONINI in the last 168 hours. ------------------------------------------------------------  RADIOLOGY:   No results found for this or any previous visit. Results for orders placed during the hospital encounter of 11/05/17  DG Chest 2 View   Narrative CLINICAL DATA:  Worsening shortness of breath since last night. History of COPD with current symptoms not responsive to medication. Minimally productive cough.  EXAM: CHEST - 2 VIEW  COMPARISON:  Portable chest x-ray of September 25, 2017  FINDINGS: The lungs are adequately inflated. The  interstitial markings are coarse. There is no alveolar infiltrate. There is no pleural effusion. The heart is top-normal in size. The pulmonary vascularity is prominent centrally on the right but stable. There is calcification in the wall of the aortic arch. The bony thorax is unremarkable.  IMPRESSION: Chronic bronchitic changes with possible superimposed acute bronchitis. No alveolar pneumonia nor CHF.  Thoracic aortic atherosclerosis.   Electronically Signed   By: David  Martinique M.D.   On: 11/05/2017 17:01    ------------------------------------------------------------------------------------------------------------------  Thank  you for allowing [facility] to assist in the care of your patient. Our recommendations are noted above.  Please contact us if we can be of further service.   Marda Stalker, M.D., F.C.C.P.  Board Certified in Internal Medicine, Pulmonary Medicine, [place], and Sleep Medicine.  [facility] Office Number: [phone]  04/24/2018

## 2018-04-25 ENCOUNTER — Encounter: Payer: Self-pay | Admitting: Internal Medicine

## 2018-04-25 ENCOUNTER — Ambulatory Visit (INDEPENDENT_AMBULATORY_CARE_PROVIDER_SITE_OTHER): Payer: Medicaid Other | Admitting: Internal Medicine

## 2018-04-25 VITALS — BP 112/60 | HR 80 | Resp 16 | Ht <= 58 in | Wt 167.0 lb

## 2018-04-25 DIAGNOSIS — J4550 Severe persistent asthma, uncomplicated: Secondary | ICD-10-CM | POA: Diagnosis not present

## 2018-04-25 MED ORDER — BUDESONIDE 0.5 MG/2ML IN SUSP: 0.5000 mg | Freq: Two times a day (BID) | RESPIRATORY_TRACT | 12 refills | Status: DC

## 2018-04-25 NOTE — Patient Instructions (Addendum)
Use xopenex and budesonide both twice daily in nebulizer.  Continue singulair tablet every night.  Will see if she can be started on Nucala injection.

## 2018-05-01 ENCOUNTER — Other Ambulatory Visit: Payer: Self-pay | Admitting: Internal Medicine

## 2018-05-01 ENCOUNTER — Ambulatory Visit (INDEPENDENT_AMBULATORY_CARE_PROVIDER_SITE_OTHER): Payer: Medicaid Other | Admitting: Family Medicine

## 2018-05-01 ENCOUNTER — Encounter: Payer: Self-pay | Admitting: Family Medicine

## 2018-05-01 VITALS — BP 134/82 | HR 80 | Temp 97.7°F | Wt 171.0 lb

## 2018-05-01 DIAGNOSIS — E1165 Type 2 diabetes mellitus with hyperglycemia: Secondary | ICD-10-CM | POA: Diagnosis not present

## 2018-05-01 LAB — POCT UA - MICROALBUMIN: MICROALBUMIN (UR) POC: 50 mg/L

## 2018-05-01 LAB — POCT GLYCOSYLATED HEMOGLOBIN (HGB A1C)
ESTIMATED AVERAGE GLUCOSE: 214
Hemoglobin A1C: 9.1 % — AB (ref 4.0–5.6)

## 2018-05-01 MED ORDER — MEPOLIZUMAB 100 MG/ML ~~LOC~~ SOAJ: 100.0000 mg | SUBCUTANEOUS | 2 refills | Status: DC

## 2018-05-01 MED ORDER — DULAGLUTIDE 1.5 MG/0.5ML ~~LOC~~ SOAJ: 1.5000 mg | SUBCUTANEOUS | 5 refills | Status: DC

## 2018-05-01 MED ORDER — ACCU-CHEK NANO SMARTVIEW W/DEVICE KIT: PACK | 0 refills | Status: DC

## 2018-05-01 NOTE — Patient Instructions (Signed)
Diabetes Mellitus and Nutrition When you have diabetes (diabetes mellitus), it is very important to have healthy eating habits because your blood sugar (glucose) levels are greatly affected by what you eat and drink. Eating healthy foods in the appropriate amounts, at about the same times every day, can help you:  Control your blood glucose.  Lower your risk of heart disease.  Improve your blood pressure.  Reach or maintain a healthy weight.  Every person with diabetes is different, and each person has different needs for a meal plan. Your health care provider may recommend that you work with a diet and nutrition specialist (dietitian) to make a meal plan that is best for you. Your meal plan may vary depending on factors such as:  The calories you need.  The medicines you take.  Your weight.  Your blood glucose, blood pressure, and cholesterol levels.  Your activity level.  Other health conditions you have, such as heart or kidney disease.  How do carbohydrates affect me? Carbohydrates affect your blood glucose level more than any other type of food. Eating carbohydrates naturally increases the amount of glucose in your blood. Carbohydrate counting is a method for keeping track of how many carbohydrates you eat. Counting carbohydrates is important to keep your blood glucose at a healthy level, especially if you use insulin or take certain oral diabetes medicines. It is important to know how many carbohydrates you can safely have in each meal. This is different for every person. Your dietitian can help you calculate how many carbohydrates you should have at each meal and for snack. Foods that contain carbohydrates include:  Bread, cereal, rice, pasta, and crackers.  Potatoes and corn.  Peas, beans, and lentils.  Milk and yogurt.  Fruit and juice.  Desserts, such as cakes, cookies, ice cream, and candy.  How does alcohol affect me? Alcohol can cause a sudden decrease in blood  glucose (hypoglycemia), especially if you use insulin or take certain oral diabetes medicines. Hypoglycemia can be a life-threatening condition. Symptoms of hypoglycemia (sleepiness, dizziness, and confusion) are similar to symptoms of having too much alcohol. If your health care provider says that alcohol is safe for you, follow these guidelines:  Limit alcohol intake to no more than 1 drink per day for nonpregnant women and 2 drinks per day for men. One drink equals 12 oz of beer, 5 oz of wine, or 1 oz of hard liquor.  Do not drink on an empty stomach.  Keep yourself hydrated with water, diet soda, or unsweetened iced tea.  Keep in mind that regular soda, juice, and other mixers may contain a lot of sugar and must be counted as carbohydrates.  What are tips for following this plan? Reading food labels  Start by checking the serving size on the label. The amount of calories, carbohydrates, fats, and other nutrients listed on the label are based on one serving of the food. Many foods contain more than one serving per package.  Check the total grams (g) of carbohydrates in one serving. You can calculate the number of servings of carbohydrates in one serving by dividing the total carbohydrates by 15. For example, if a food has 30 g of total carbohydrates, it would be equal to 2 servings of carbohydrates.  Check the number of grams (g) of saturated and trans fats in one serving. Choose foods that have low or no amount of these fats.  Check the number of milligrams (mg) of sodium in one serving. Most people   should limit total sodium intake to less than 2,300 mg per day.  Always check the nutrition information of foods labeled as "low-fat" or "nonfat". These foods may be higher in added sugar or refined carbohydrates and should be avoided.  Talk to your dietitian to identify your daily goals for nutrients listed on the label. Shopping  Avoid buying canned, premade, or processed foods. These  foods tend to be high in fat, sodium, and added sugar.  Shop around the outside edge of the grocery store. This includes fresh fruits and vegetables, bulk grains, fresh meats, and fresh dairy. Cooking  Use low-heat cooking methods, such as baking, instead of high-heat cooking methods like deep frying.  Cook using healthy oils, such as olive, canola, or sunflower oil.  Avoid cooking with butter, cream, or high-fat meats. Meal planning  Eat meals and snacks regularly, preferably at the same times every day. Avoid going long periods of time without eating.  Eat foods high in fiber, such as fresh fruits, vegetables, beans, and whole grains. Talk to your dietitian about how many servings of carbohydrates you can eat at each meal.  Eat 4-6 ounces of lean protein each day, such as lean meat, chicken, fish, eggs, or tofu. 1 ounce is equal to 1 ounce of meat, chicken, or fish, 1 egg, or 1/4 cup of tofu.  Eat some foods each day that contain healthy fats, such as avocado, nuts, seeds, and fish. Lifestyle   Check your blood glucose regularly.  Exercise at least 30 minutes 5 or more days each week, or as told by your health care provider.  Take medicines as told by your health care provider.  Do not use any products that contain nicotine or tobacco, such as cigarettes and e-cigarettes. If you need help quitting, ask your health care provider.  Work with a counselor or diabetes educator to identify strategies to manage stress and any emotional and social challenges. What are some questions to ask my health care provider?  Do I need to meet with a diabetes educator?  Do I need to meet with a dietitian?  What number can I call if I have questions?  When are the best times to check my blood glucose? Where to find more information:  American Diabetes Association: diabetes.org/food-and-fitness/food  Academy of Nutrition and Dietetics:  www.eatright.org/resources/health/diseases-and-conditions/diabetes  National Institute of Diabetes and Digestive and Kidney Diseases (NIH): www.niddk.nih.gov/health-information/diabetes/overview/diet-eating-physical-activity Summary  A healthy meal plan will help you control your blood glucose and maintain a healthy lifestyle.  Working with a diet and nutrition specialist (dietitian) can help you make a meal plan that is best for you.  Keep in mind that carbohydrates and alcohol have immediate effects on your blood glucose levels. It is important to count carbohydrates and to use alcohol carefully. This information is not intended to replace advice given to you by your health care provider. Make sure you discuss any questions you have with your health care provider. Document Released: 02/22/2005 Document Revised: 07/02/2016 Document Reviewed: 07/02/2016 Elsevier Interactive Patient Education  2018 [employer]  

## 2018-05-01 NOTE — Progress Notes (Signed)
Patient: Whitney Fleming Female    DOB: [birth year]   72 y.o.   MRN: [id number] Visit Date: 05/01/2018  Today's Provider: Lavon Paganini, MD   Chief Complaint  Patient presents with  . Diabetes   Subjective:    HPI  Diabetes Mellitus Type II, Follow-up:   Lab Results  Component Value Date   HGBA1C 9.1 (A) 05/01/2018   HGBA1C 8.8 (A) 01/14/2018   HGBA1C 8.1 (H) 09/27/2017    Last seen for diabetes 3 months ago.  Management since then includes none. She reports good compliance with treatment. She is not having side effects.  Current symptoms include none and have been stable. Home blood sugar records: around 200  Episodes of hypoglycemia? no   Current Insulin Regimen: No-was on insulin with significantly elevated blood sugars when on prednisone, no insulin currently Most Recent Eye Exam: 04/2018 Weight trend: stable Prior visit with dietician: no Current diet: well balanced Current exercise: none  Pertinent Labs:    Component Value Date/Time   CHOL 110 01/17/2018 0823   CHOL 91 12/25/2012 0154   TRIG 103 01/17/2018 0823   TRIG 143 12/25/2012 0154   HDL 37 (L) 01/17/2018 0823   HDL 26 (L) 12/25/2012 0154   LDLCALC 52 01/17/2018 0823   LDLCALC 36 12/25/2012 0154   CREATININE 0.88 02/12/2018 0404   CREATININE 1.16 12/26/2012 0545    Wt Readings from Last 3 Encounters:  05/01/18 171 lb (77.6 kg)  04/25/18 167 lb (75.8 kg)  03/03/18 171 lb 3.2 oz (77.7 kg)    ------------------------------------------------------------------------     No Known Allergies   Current Outpatient Medications:  .  ACCU-CHEK FASTCLIX LANCETS MISC, Use as directed to check blood glucose twice daily, Disp: 100 each, Rfl: 3 .  AMBULATORY NON FORMULARY MEDICATION, Medication Name: Aero chamber use as directed. 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  azithromycin (ZITHROMAX) 250 MG tablet, Take 1 tablet (250 mg total) by mouth daily. Take one tablet every Monday, Wednesday,  Friday, Disp: 13 tablet, Rfl: 5 .  Blood Glucose Monitoring Suppl (ACCU-CHEK NANO SMARTVIEW) w/Device KIT, Use as directed to check blood glucose twice daily, Disp: 1 kit, Rfl: 0 .  budesonide (PULMICORT) 0.5 MG/2ML nebulizer solution, Take 2 mLs (0.5 mg total) by nebulization 2 (two) times daily., Disp: 75 mL, Rfl: 12 .  budesonide-formoterol (SYMBICORT) 160-4.5 MCG/ACT inhaler, Inhale 2 puffs into the lungs 2 (two) times daily., Disp: 1 Inhaler, Rfl: 6 .  carvedilol (COREG) 12.5 MG tablet, Take 1 tablet (12.5 mg total) by mouth 2 (two) times daily with a meal., Disp: 60 tablet, Rfl: 3 .  glucose blood (ACCU-CHEK SMARTVIEW) test strip, Use as directed to check blood glucose twice daily, Disp: 100 each, Rfl: 3 .  Insulin Pen Needle (NOVOFINE) 32G X 6 MM MISC, Use as directed to inject insulin daily, Disp: 100 each, Rfl: 3 .  JARDIANCE 25 MG TABS tablet, TAKE 1 TABLET BY MOUTH ONCE DAILY, Disp: 30 tablet, Rfl: 2 .  levalbuterol (XOPENEX) 1.25 MG/3ML nebulizer solution, Take 1.25 mg by nebulization every 6 (six) hours as needed for wheezing. DX:J44.9, Disp: 216 mL, Rfl: 5 .  losartan (COZAAR) 50 MG tablet, Take 1 tablet (50 mg total) by mouth daily., Disp: 90 tablet, Rfl: 3 .  Mepolizumab (NUCALA) 100 MG/ML SOAJ, Inject 100 mg into the skin every 30 (thirty) days., Disp: 3 Syringe, Rfl: 2 .  metFORMIN (GLUCOPHAGE) 1000 MG tablet, Take 1 tablet (1,000 mg total) by  mouth 2 (two) times daily with a meal., Disp: 180 tablet, Rfl: 3 .  montelukast (SINGULAIR) 10 MG tablet, Take 1 tablet (10 mg total) by mouth at bedtime., Disp: 30 tablet, Rfl: 3 .  PRESCRIPTION MEDICATION, **Non-US market drug** Zyrova C (rosuvastatin/clopidigrel) Take 1 capsule by mouth every evening, Disp: , Rfl:  .  rosuvastatin (CRESTOR) 5 MG tablet, Take 1 tablet (5 mg total) by mouth daily., Disp: 30 tablet, Rfl: 3 .  theophylline (THEODUR) 200 MG 12 hr tablet, Take 1 tablet (200 mg total) by mouth 2 (two) times daily., Disp: 60 tablet,  Rfl: 2 .  Dulaglutide (TRULICITY) 1.5 EV/0.3JK SOPN, Inject 1.5 mg into the skin once a week., Disp: 4 pen, Rfl: 5  Review of Systems  Constitutional: Negative.   HENT: Negative.   Respiratory: Positive for cough and shortness of breath. Negative for apnea, choking, chest tightness, wheezing and stridor.   Cardiovascular: Negative.   Gastrointestinal: Negative.   Endocrine: Negative.   Genitourinary: Negative.   Musculoskeletal: Positive for arthralgias and back pain. Negative for gait problem, joint swelling, myalgias and neck pain.  Neurological: Negative.   Psychiatric/Behavioral: Negative.     Social History   Tobacco Use  . Smoking status: Never Smoker  . Smokeless tobacco: Never Used  Substance Use Topics  . Alcohol use: Never    Frequency: Never   Objective:   BP 134/82 (BP Location: Left Arm, Patient Position: Sitting, Cuff Size: Normal)   Pulse 80   Temp 97.7 F (36.5 C) (Oral)   Wt 171 lb (77.6 kg)   SpO2 99%   BMI 35.74 kg/m  Vitals:   05/01/18 1337  BP: 134/82  Pulse: 80  Temp: 97.7 F (36.5 C)  TempSrc: Oral  SpO2: 99%  Weight: 171 lb (77.6 kg)     Physical Exam  Constitutional: She is oriented to person, place, and time. She appears well-developed and well-nourished. No distress.  HENT:  Head: Normocephalic and atraumatic.  Mouth/Throat: Oropharynx is clear and moist.  Eyes: Conjunctivae are normal. No scleral icterus.  Neck: Neck supple. No thyromegaly present.  Cardiovascular: Normal rate, regular rhythm, normal heart sounds and intact distal pulses.  No murmur heard. Pulmonary/Chest: Effort normal and breath sounds normal. No respiratory distress. She has no wheezes. She has no rales.  Abdominal: Soft. She exhibits no distension. There is no tenderness.  Musculoskeletal: She exhibits no edema.  Neurological: She is alert and oriented to person, place, and time.  Skin: Skin is warm and dry. Capillary refill takes less than 2 seconds. No rash  noted.  Psychiatric: She has a normal mood and affect. Her behavior is normal.  Vitals reviewed.   Results for orders placed or performed in visit on 05/01/18  POCT UA - Microalbumin  Result Value Ref Range   Microalbumin Ur, POC 50 mg/L   Creatinine, POC     Albumin/Creatinine Ratio, Urine, POC    POCT glycosylated hemoglobin (Hb A1C)  Result Value Ref Range   Hemoglobin A1C 9.1 (A) 4.0 - 5.6 %   HbA1c POC (<> result, manual entry)     HbA1c, POC (prediabetic range)     HbA1c, POC (controlled diabetic range)     Est. average glucose Bld gHb Est-mCnc 214        Assessment & Plan:   Problem List Items Addressed This Visit      Endocrine   Diabetes ([place]) - Primary    Uncontrolled A1c has risen from 8.8 at last visit  to 9.1 today She has in the past had significant elevation in blood glucose during asthma/COPD exacerbations when taking prednisone She is not currently on Lantus, but she has taken this in the past Continue metformin 1000 mg twice daily and Jardiance 25 mg daily We will add GLP-1 therapy with Trulicity First dose given in office today with sample of 0.75 mg She will do this dose next week as well and then a prescription was sent for 1.5 mg to take weekly We discussed possible side effects and return precautions We should reserve DPP for therapy or sulfonylurea therapy for alternatives in the future given her risk of hypoglycemia with her age and comorbidities In the future, if she is going to be on steroids again, we will consider giving her glipizide 5 mg twice daily or Lantus 10 units nightly just while she is on steroids She takes losartan She is up-to-date on foot exam and vaccinations Can continue taking fasting blood glucose measurements      Relevant Medications   Blood Glucose Monitoring Suppl (ACCU-CHEK NANO SMARTVIEW) w/Device KIT   Dulaglutide (TRULICITY) 1.5 ZT/2.4PY SOPN   Other Relevant Orders   POCT UA - Microalbumin (Completed)   POCT  glycosylated hemoglobin (Hb A1C) (Completed)       Return in about 3 months (around 08/01/2018) for DM, HTN, HLD f/u.   The entirety of the information documented in the History of Present Illness, Review of Systems and Physical Exam were personally obtained by me. Portions of this information were initially documented by Hurman Horn, CMA and reviewed by me for thoroughness and accuracy.    [name], MD, MPH [facility] 05/01/2018 4:16 PM

## 2018-05-01 NOTE — Assessment & Plan Note (Signed)
Uncontrolled A1c has risen from 8.8 at last visit to 9.1 today She has in the past had significant elevation in blood glucose during asthma/COPD exacerbations when taking prednisone She is not currently on Lantus, but she has taken this in the past Continue metformin 1000 mg twice daily and Jardiance 25 mg daily We will add GLP-1 therapy with Trulicity First dose given in office today with sample of 0.75 mg She will do this dose next week as well and then a prescription was sent for 1.5 mg to take weekly We discussed possible side effects and return precautions We should reserve DPP for therapy or sulfonylurea therapy for alternatives in the future given her risk of hypoglycemia with her age and comorbidities In the future, if she is going to be on steroids again, we will consider giving her glipizide 5 mg twice daily or Lantus 10 units nightly just while she is on steroids She takes losartan She is up-to-date on foot exam and vaccinations Can continue taking fasting blood glucose measurements

## 2018-05-03 ENCOUNTER — Other Ambulatory Visit: Payer: Self-pay | Admitting: Internal Medicine

## 2018-05-11 ENCOUNTER — Other Ambulatory Visit: Payer: Self-pay | Admitting: Family Medicine

## 2018-05-20 ENCOUNTER — Telehealth: Payer: Self-pay | Admitting: *Deleted

## 2018-05-20 NOTE — Telephone Encounter (Signed)
[place]. Spoke with [facility]. They don't have correct insurance for the patient. Rx Nucala show in process but held up on insurance. I have faxed copy of insurance card along with demographics and [place] Medicaid approval of Nucala. Pending approval from [place].

## 2018-05-23 ENCOUNTER — Other Ambulatory Visit: Payer: Self-pay | Admitting: Family Medicine

## 2018-05-23 MED ORDER — EXENATIDE ER 2 MG/0.85ML ~~LOC~~ AUIJ: 2.0000 mg | AUTO-INJECTOR | SUBCUTANEOUS | 3 refills | Status: DC

## 2018-05-26 ENCOUNTER — Other Ambulatory Visit: Payer: Self-pay | Admitting: Family Medicine

## 2018-05-26 MED ORDER — EXENATIDE ER 2 MG ~~LOC~~ PEN: 2.0000 mg | PEN_INJECTOR | SUBCUTANEOUS | 3 refills | Status: DC

## 2018-05-29 ENCOUNTER — Inpatient Hospital Stay: Payer: Medicaid Other | Attending: Oncology | Admitting: Oncology

## 2018-05-29 ENCOUNTER — Encounter: Payer: Self-pay | Admitting: Oncology

## 2018-05-29 ENCOUNTER — Other Ambulatory Visit: Payer: Self-pay

## 2018-05-29 ENCOUNTER — Inpatient Hospital Stay: Payer: Medicaid Other

## 2018-05-29 VITALS — BP 106/62 | HR 72 | Temp 97.3°F | Resp 18 | Wt 169.5 lb

## 2018-05-29 DIAGNOSIS — E1165 Type 2 diabetes mellitus with hyperglycemia: Secondary | ICD-10-CM | POA: Diagnosis not present

## 2018-05-29 DIAGNOSIS — M545 Low back pain: Secondary | ICD-10-CM | POA: Diagnosis not present

## 2018-05-29 DIAGNOSIS — I517 Cardiomegaly: Secondary | ICD-10-CM | POA: Insufficient documentation

## 2018-05-29 DIAGNOSIS — J449 Chronic obstructive pulmonary disease, unspecified: Secondary | ICD-10-CM | POA: Insufficient documentation

## 2018-05-29 DIAGNOSIS — D472 Monoclonal gammopathy: Secondary | ICD-10-CM

## 2018-05-29 DIAGNOSIS — R7989 Other specified abnormal findings of blood chemistry: Secondary | ICD-10-CM | POA: Insufficient documentation

## 2018-05-29 DIAGNOSIS — Z794 Long term (current) use of insulin: Secondary | ICD-10-CM

## 2018-05-29 DIAGNOSIS — I10 Essential (primary) hypertension: Secondary | ICD-10-CM | POA: Diagnosis not present

## 2018-05-29 DIAGNOSIS — G4700 Insomnia, unspecified: Secondary | ICD-10-CM | POA: Diagnosis not present

## 2018-05-29 DIAGNOSIS — Z79899 Other long term (current) drug therapy: Secondary | ICD-10-CM | POA: Insufficient documentation

## 2018-05-29 DIAGNOSIS — D649 Anemia, unspecified: Secondary | ICD-10-CM | POA: Diagnosis not present

## 2018-05-29 DIAGNOSIS — D638 Anemia in other chronic diseases classified elsewhere: Secondary | ICD-10-CM

## 2018-05-29 LAB — CBC WITH DIFFERENTIAL/PLATELET
Abs Immature Granulocytes: 0.04 10*3/uL (ref 0.00–0.07)
Basophils Absolute: 0.1 10*3/uL (ref 0.0–0.1)
Basophils Relative: 1 %
EOS ABS: 0.3 10*3/uL (ref 0.0–0.5)
Eosinophils Relative: 3 %
HCT: 42.8 % (ref 36.0–46.0)
Hemoglobin: 13 g/dL (ref 12.0–15.0)
Immature Granulocytes: 1 %
LYMPHS ABS: 1.9 10*3/uL (ref 0.7–4.0)
Lymphocytes Relative: 23 %
MCH: 24.8 pg — ABNORMAL LOW (ref 26.0–34.0)
MCHC: 30.4 g/dL (ref 30.0–36.0)
MCV: 81.5 fL (ref 80.0–100.0)
Monocytes Absolute: 0.6 10*3/uL (ref 0.1–1.0)
Monocytes Relative: 8 %
Neutro Abs: 5.4 10*3/uL (ref 1.7–7.7)
Neutrophils Relative %: 64 %
Platelets: 227 10*3/uL (ref 150–400)
RBC: 5.25 MIL/uL — ABNORMAL HIGH (ref 3.87–5.11)
RDW: 13.2 % (ref 11.5–15.5)
WBC: 8.3 10*3/uL (ref 4.0–10.5)
nRBC: 0 % (ref 0.0–0.2)

## 2018-05-29 NOTE — Progress Notes (Signed)
Pt in for follow up, reports less short of breath on exertion and energy levels have improved.  States has some back pain at times, denies pain today.

## 2018-05-30 NOTE — Progress Notes (Signed)
Hematology/Oncology Consult note [facility]  [phone] [phone]  Patient Care Team: [name], MD as PCP - General (Family Medicine)   Name of the patient: Whitney Fleming  [id number]  [birth year]   Date of visit: 05/30/18  Diagnosis- normocytic anemia likely anemia of chronic disease   Chief complaint/ Reason for visit-routine follow-up of anemia  Heme/Onc history: patient is a 72 year old female with a past medical history significant for hypertension, hyperlipidemia diabetes and asthma. She has been referred to [place] for evaluation of anemia. Patient's baseline hemoglobin has been around 11-12 this year. However the most recent hemoglobin on 01/17/2018 was 9.4/28.1. White count was mildly elevated at 11.1 and platelets were normal at 410. Of note she has had mild microcytosis and her MCV ranges between 78-81. Recent B12 and folate were within normal limits. Patient has had intermittent hyperkalemia in the past. Iron studies from August 2019 showed iron saturation of 90%. Serum iron was normal at 58 and TIBC normal at 303. Last hemoglobin A1c in April 2019 was elevated at 8.1.  Patient has chronic COPD and follows up with pulmonary for the same.  She has been on and off steroids and often gets steroid-induced hyperglycemia  Results of blood work from 02/05/2018 were as follows: CBC showed white count of 11.5, H&H of 11.5/34.1 with an MCV of 82.8 and a platelet count of 185.  Her hemoglobin actually improved from 9.4-11.5 from a month prior.  CMP showed mildly elevated creatinine of 1.17.  Blood sugars were elevated at 410.  Multiple myeloma panel revealed IgG kappa monoclonal protein of 0.3 g.  Kappa lambda light chain ratio was normal at 1.12.  Reticulocyte count was normal at 2.4.  Smear review was unremarkable.  Haptoglobin levels were elevated at 233.  ESR was elevated at 36.  Ferritin was normal at 80.  TSH was normal at  1.9.   Interval history-she is doing well and feels at her baseline.  She is now off steroids.  She does have troubles sleeping and chronic low back pain.  Denies other complaints  ECOG PS- 2 Pain scale- 0 Opioid associated constipation- no  Review of systems- Review of Systems  Constitutional: Negative for chills, fever, malaise/fatigue and weight loss.  HENT: Negative for congestion, ear discharge and nosebleeds.   Eyes: Negative for blurred vision.  Respiratory: Negative for cough, hemoptysis, sputum production, shortness of breath and wheezing.   Cardiovascular: Negative for chest pain, palpitations, orthopnea and claudication.  Gastrointestinal: Negative for abdominal pain, blood in stool, constipation, diarrhea, heartburn, melena, nausea and vomiting.  Genitourinary: Negative for dysuria, flank pain, frequency, hematuria and urgency.  Musculoskeletal: Negative for back pain, joint pain and myalgias.  Skin: Negative for rash.  Neurological: Negative for dizziness, tingling, focal weakness, seizures, weakness and headaches.  Endo/Heme/Allergies: Does not bruise/bleed easily.  Psychiatric/Behavioral: Negative for depression and suicidal ideas. The patient does not have insomnia.       No Known Allergies   Past Medical History:  Diagnosis Date  . Anemia    normocytic anemia  . Asthma   . Cardiomegaly   . Diabetes mellitus without complication ([place])   . Hypertension   . Hyponatremia      Past Surgical History:  Procedure Laterality Date  . TUBAL LIGATION      Social History   Socioeconomic History  . Marital status: Married    Spouse name: Not on file  . Number of children: 4  .  Years of education: Not on file  . Highest education level: Not on file  Occupational History  . Occupation: homemaker  Social Needs  . Financial resource strain: Not on file  . Food insecurity:    Worry: Not on file    Inability: Not on file  . Transportation needs:    Medical:  Not on file    Non-medical: Not on file  Tobacco Use  . Smoking status: Never Smoker  . Smokeless tobacco: Never Used  Substance and Sexual Activity  . Alcohol use: Never    Frequency: Never  . Drug use: Never  . Sexual activity: Not Currently  Lifestyle  . Physical activity:    Days per week: Not on file    Minutes per session: Not on file  . Stress: Not on file  Relationships  . Social connections:    Talks on phone: Not on file    Gets together: Not on file    Attends religious service: Not on file    Active member of club or organization: Not on file    Attends meetings of clubs or organizations: Not on file    Relationship status: Not on file  . Intimate partner violence:    Fear of current or ex partner: Not on file    Emotionally abused: Not on file    Physically abused: Not on file    Forced sexual activity: Not on file  Other Topics Concern  . Not on file  Social History Narrative  . Not on file    Family History  Problem Relation Age of Onset  . Tuberculosis Other   . Healthy Mother   . Heart disease Father   . Asthma Father   . Cancer Neg Hx      Current Outpatient Medications:  .  ACCU-CHEK FASTCLIX LANCETS MISC, Use as directed to check blood glucose twice daily, Disp: 100 each, Rfl: 3 .  AMBULATORY NON FORMULARY MEDICATION, Medication Name: Aero chamber use as directed. 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  azithromycin (ZITHROMAX) 250 MG tablet, Take 1 tablet (250 mg total) by mouth daily. Take one tablet every Monday, Wednesday, Friday, Disp: 13 tablet, Rfl: 5 .  Blood Glucose Monitoring Suppl (ACCU-CHEK NANO SMARTVIEW) w/Device KIT, Use as directed to check blood glucose twice daily, Disp: 1 kit, Rfl: 0 .  budesonide (PULMICORT) 0.5 MG/2ML nebulizer solution, Take 2 mLs (0.5 mg total) by nebulization 2 (two) times daily., Disp: 75 mL, Rfl: 12 .  budesonide-formoterol (SYMBICORT) 160-4.5 MCG/ACT inhaler, Inhale 2 puffs into the lungs 2 (two) times  daily., Disp: 1 Inhaler, Rfl: 6 .  carvedilol (COREG) 12.5 MG tablet, TAKE 1 TABLET BY MOUTH TWICE DAILY WITH A MEAL, Disp: 60 tablet, Rfl: 3 .  glucose blood (ACCU-CHEK SMARTVIEW) test strip, Use as directed to check blood glucose twice daily, Disp: 100 each, Rfl: 3 .  Insulin Pen Needle (NOVOFINE) 32G X 6 MM MISC, Use as directed to inject insulin daily, Disp: 100 each, Rfl: 3 .  JARDIANCE 25 MG TABS tablet, TAKE 1 TABLET BY MOUTH ONCE DAILY, Disp: 30 tablet, Rfl: 2 .  levalbuterol (XOPENEX) 1.25 MG/3ML nebulizer solution, Take 1.25 mg by nebulization every 6 (six) hours as needed for wheezing. DX:J44.9, Disp: 216 mL, Rfl: 5 .  losartan (COZAAR) 50 MG tablet, Take 1 tablet (50 mg total) by mouth daily., Disp: 90 tablet, Rfl: 3 .  Mepolizumab (NUCALA) 100 MG/ML SOAJ, Inject 100 mg into the skin every 30 (thirty)  days., Disp: 3 Syringe, Rfl: 2 .  metFORMIN (GLUCOPHAGE) 1000 MG tablet, Take 1 tablet (1,000 mg total) by mouth 2 (two) times daily with a meal., Disp: 180 tablet, Rfl: 3 .  montelukast (SINGULAIR) 10 MG tablet, Take 1 tablet (10 mg total) by mouth at bedtime., Disp: 30 tablet, Rfl: 3 .  PRESCRIPTION MEDICATION, **Non-US market drug** Zyrova C (rosuvastatin/clopidigrel) Take 1 capsule by mouth every evening, Disp: , Rfl:  .  rosuvastatin (CRESTOR) 5 MG tablet, Take 1 tablet (5 mg total) by mouth daily., Disp: 30 tablet, Rfl: 3 .  THEO-24 200 MG 24 hr capsule, TAKE 1 CAPSULE BY MOUTH ONCE DAILY, Disp: 90 capsule, Rfl: 2 .  Exenatide ER (BYDUREON) 2 MG PEN, Inject 2 mg into the skin once a week. (Patient not taking: Reported on 05/29/2018), Disp: 4 each, Rfl: 3  Physical exam:  Vitals:   05/29/18 1129  BP: 106/62  Pulse: 72  Resp: 18  Temp: (!) 97.3 F (36.3 C)  TempSrc: Tympanic  Weight: 169 lb 8 oz (76.9 kg)   Physical Exam HENT:     Head: Normocephalic and atraumatic.  Eyes:     Pupils: Pupils are equal, round, and reactive to light.  Neck:     Musculoskeletal: Normal range  of motion.  Cardiovascular:     Rate and Rhythm: Normal rate and regular rhythm.     Heart sounds: Normal heart sounds.  Pulmonary:     Effort: Pulmonary effort is normal.     Breath sounds: Normal breath sounds.  Abdominal:     General: Bowel sounds are normal.     Palpations: Abdomen is soft.  Skin:    General: Skin is warm and dry.  Neurological:     Mental Status: She is alert and oriented to person, place, and time.      CMP Latest Ref Rng & Units 02/12/2018  Glucose 70 - 99 mg/dL 305(H)  BUN 8 - 23 mg/dL 24(H)  Creatinine 0.44 - 1.00 mg/dL 0.88  Sodium 135 - 145 mmol/L 135  Potassium 3.5 - 5.1 mmol/L 4.8  Chloride 98 - 111 mmol/L 101  CO2 22 - 32 mmol/L 21(L)  Calcium 8.9 - 10.3 mg/dL 8.8(L)  Total Protein 6.5 - 8.1 g/dL -  Total Bilirubin 0.3 - 1.2 mg/dL -  Alkaline Phos 38 - 126 U/L -  AST 15 - 41 U/L -  ALT 0 - 44 U/L -   CBC Latest Ref Rng & Units 05/29/2018  WBC 4.0 - 10.5 K/uL 8.3  Hemoglobin 12.0 - 15.0 g/dL 13.0  Hematocrit 36.0 - 46.0 % 42.8  Platelets 150 - 400 K/uL 227     Assessment and plan- Patient is a 72 y.o. female with normocytic anemia likely secondary to chronic disease  Patient's hemoglobin has improved to 13 today.  Typically her hemoglobin runs around 11.  I suspect her anemia is due to her underlying comorbidities.  Patient was found to have a small amount of M protein of 0.3 g done as a part of her anemia work-up.  I would therefore like to see her back in 6 months time with repeat CBC with differential and myeloma panel    Visit Diagnosis 1. 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 Randa Evens, MD, MPH [facility] [phone] 05/30/2018 9:57 AM

## 2018-07-07 ENCOUNTER — Other Ambulatory Visit: Payer: Self-pay | Admitting: Family Medicine

## 2018-07-20 ENCOUNTER — Other Ambulatory Visit: Payer: Self-pay | Admitting: Family Medicine

## 2018-07-27 ENCOUNTER — Other Ambulatory Visit: Payer: Self-pay | Admitting: Internal Medicine

## 2018-07-30 ENCOUNTER — Ambulatory Visit: Payer: Medicaid Other | Admitting: Family Medicine

## 2018-08-04 ENCOUNTER — Ambulatory Visit: Payer: Medicaid Other | Admitting: Physician Assistant

## 2018-08-04 ENCOUNTER — Other Ambulatory Visit: Payer: Self-pay | Admitting: Physician Assistant

## 2018-08-04 ENCOUNTER — Ambulatory Visit: Payer: Medicaid Other | Admitting: Family Medicine

## 2018-08-04 NOTE — Progress Notes (Deleted)
Patient: Whitney Fleming Female    DOB: Aug 27, 1945   73 y.o.   MRN: [id number] Visit Date: 08/04/2018  Today's Provider: Lavon Paganini, MD   No chief complaint on file.  Subjective:     HPI  Diabetes Mellitus Type II, Follow-up:   RecentLabs       Lab Results  Component Value Date   HGBA1C 9.1 (A) 05/01/2018   HGBA1C 8.8 (A) 01/14/2018   HGBA1C 8.1 (H) 09/27/2017      Last seen for diabetes 3 months ago.  Management since then includes added Trulicity 7.04 mg, advised to continue Metformin and Jardiance. She reports good compliance with treatment. She is not having side effects.  Current symptoms include none  Home blood sugar records: around 200  Episodes of hypoglycemia? no              Current Insulin Regimen: Trulicity Most Recent Eye Exam: 04/2018 Weight trend: stable Prior visit with dietician: no Current diet: well balanced Current exercise: none  Pertinent Labs: Labs(Brief)   Lab Results  Component Value Date   CHOL 110 01/17/2018   HDL 37 (L) 01/17/2018   LDLCALC 52 01/17/2018   TRIG 103 01/17/2018   CHOLHDL 3.0 01/17/2018       Wt Readings from Last 3 Encounters:  05/01/18 171 lb (77.6 kg)  04/25/18 167 lb (75.8 kg)  03/03/18 171 lb 3.2 oz (77.7 kg)    ------------------------------------------------------------------------   Hypertension, follow-up:  BP Readings from Last 3 Encounters:  05/29/18 106/62  05/01/18 134/82  04/25/18 112/60    She was last seen for hypertension 3 months ago.  BP at that visit was 134/82. Management since that visit includes no changes.She reports good compliance with treatment. She is not having side effects.  She {is/is not:9024} exercising. She {is/is not:9024} adherent to low salt diet.   Outside blood pressures are ***. She is experiencing none.  Patient denies chest pain, chest pressure/discomfort, claudication, dyspnea, exertional chest pressure/discomfort, fatigue,  irregular heart beat, lower extremity edema, near-syncope, orthopnea, palpitations, paroxysmal nocturnal dyspnea, syncope and tachypnea.   Cardiovascular risk factors include advanced age (older than 14 for men, 31 for women), diabetes mellitus, dyslipidemia, hypertension and obesity (BMI >= 30 kg/m2).  Use of agents associated with hypertension: none.   ------------------------------------------------------------------------    Lipid/Cholesterol, Follow-up:   Last seen for this 3 months ago.  Management since that visit includes no change.  Last Lipid Panel:    Component Value Date/Time   CHOL 110 01/17/2018 0823   CHOL 91 12/25/2012 0154   TRIG 103 01/17/2018 0823   TRIG 143 12/25/2012 0154   HDL 37 (L) 01/17/2018 0823   HDL 26 (L) 12/25/2012 0154   CHOLHDL 3.0 01/17/2018 0823   VLDL 29 12/25/2012 0154   LDLCALC 52 01/17/2018 0823   LDLCALC 36 12/25/2012 0154    She reports good compliance with treatment. She is not having side effects.   Wt Readings from Last 3 Encounters:  05/29/18 169 lb 8 oz (76.9 kg)  05/01/18 171 lb (77.6 kg)  04/25/18 167 lb (75.8 kg)    ------------------------------------------------------------------------  No Known Allergies   Current Outpatient Medications:  .  ACCU-CHEK FASTCLIX LANCETS MISC, Use as directed to check blood glucose twice daily, Disp: 100 each, Rfl: 3 .  AMBULATORY NON FORMULARY MEDICATION, Medication Name: Aero chamber use as directed. 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  azithromycin (ZITHROMAX) 250 MG tablet, Take 1 tablet (250 mg  total) by mouth daily. Take one tablet every Monday, Wednesday, Friday, Disp: 13 tablet, Rfl: 5 .  Blood Glucose Monitoring Suppl (ACCU-CHEK NANO SMARTVIEW) w/Device KIT, Use as directed to check blood glucose twice daily, Disp: 1 kit, Rfl: 0 .  budesonide (PULMICORT) 0.5 MG/2ML nebulizer solution, Take 2 mLs (0.5 mg total) by nebulization 2 (two) times daily., Disp: 75 mL, Rfl: 12 .   budesonide-formoterol (SYMBICORT) 160-4.5 MCG/ACT inhaler, Inhale 2 puffs into the lungs 2 (two) times daily., Disp: 1 Inhaler, Rfl: 6 .  carvedilol (COREG) 12.5 MG tablet, TAKE 1 TABLET BY MOUTH TWICE DAILY WITH A MEAL, Disp: 60 tablet, Rfl: 3 .  Exenatide ER (BYDUREON) 2 MG PEN, Inject 2 mg into the skin once a week. (Patient not taking: Reported on 05/29/2018), Disp: 4 each, Rfl: 3 .  glucose blood (ACCU-CHEK SMARTVIEW) test strip, Use as directed to check blood glucose twice daily, Disp: 100 each, Rfl: 3 .  Insulin Pen Needle (NOVOFINE) 32G X 6 MM MISC, Use as directed to inject insulin daily, Disp: 100 each, Rfl: 3 .  JARDIANCE 25 MG TABS tablet, TAKE 1 TABLET BY MOUTH ONCE DAILY, Disp: 30 tablet, Rfl: 5 .  levalbuterol (XOPENEX) 1.25 MG/3ML nebulizer solution, Take 1.25 mg by nebulization every 6 (six) hours as needed for wheezing. DX:J44.9, Disp: 216 mL, Rfl: 5 .  losartan (COZAAR) 50 MG tablet, Take 1 tablet (50 mg total) by mouth daily., Disp: 90 tablet, Rfl: 3 .  Mepolizumab (NUCALA) 100 MG/ML SOAJ, Inject 100 mg into the skin every 30 (thirty) days., Disp: 3 Syringe, Rfl: 2 .  metFORMIN (GLUCOPHAGE) 1000 MG tablet, Take 1 tablet (1,000 mg total) by mouth 2 (two) times daily with a meal., Disp: 180 tablet, Rfl: 3 .  montelukast (SINGULAIR) 10 MG tablet, TAKE 1 TABLET BY MOUTH AT BEDTIME, Disp: 30 tablet, Rfl: 0 .  PRESCRIPTION MEDICATION, **Non-US market drug** Zyrova C (rosuvastatin/clopidigrel) Take 1 capsule by mouth every evening, Disp: , Rfl:  .  rosuvastatin (CRESTOR) 5 MG tablet, TAKE 1 TABLET BY MOUTH ONCE DAILY, Disp: 90 tablet, Rfl: 3 .  THEO-24 200 MG 24 hr capsule, TAKE 1 CAPSULE BY MOUTH ONCE DAILY, Disp: 90 capsule, Rfl: 2  Review of Systems  Constitutional: Negative.   Respiratory: Negative.   Cardiovascular: Negative.   Endocrine: Negative.   Musculoskeletal: Negative.     Social History   Tobacco Use  . Smoking status: Never Smoker  . Smokeless tobacco: Never  Used  Substance Use Topics  . Alcohol use: Never    Frequency: Never      Objective:   There were no vitals taken for this visit. There were no vitals filed for this visit.   Physical Exam      Assessment & Plan        Lavon Paganini, MD  [facility]

## 2018-08-04 NOTE — Progress Notes (Signed)
Note stating patient is out of medication, but does not state which medication. Also of note patient was to have appt at 620 today, but she NOS. On review of all medications there should be refills at the pharmacy. They should just need to call the pharmacy for refills.

## 2018-08-07 ENCOUNTER — Ambulatory Visit (INDEPENDENT_AMBULATORY_CARE_PROVIDER_SITE_OTHER): Payer: Medicaid Other | Admitting: Family Medicine

## 2018-08-07 ENCOUNTER — Ambulatory Visit: Payer: Medicaid Other | Admitting: Family Medicine

## 2018-08-07 ENCOUNTER — Encounter: Payer: Self-pay | Admitting: Family Medicine

## 2018-08-07 VITALS — BP 140/79 | HR 96 | Temp 97.7°F | Wt 165.4 lb

## 2018-08-07 DIAGNOSIS — E785 Hyperlipidemia, unspecified: Secondary | ICD-10-CM | POA: Diagnosis not present

## 2018-08-07 DIAGNOSIS — E1165 Type 2 diabetes mellitus with hyperglycemia: Secondary | ICD-10-CM

## 2018-08-07 DIAGNOSIS — J411 Mucopurulent chronic bronchitis: Secondary | ICD-10-CM

## 2018-08-07 DIAGNOSIS — E1169 Type 2 diabetes mellitus with other specified complication: Secondary | ICD-10-CM

## 2018-08-07 DIAGNOSIS — I10 Essential (primary) hypertension: Secondary | ICD-10-CM | POA: Diagnosis not present

## 2018-08-07 LAB — POCT GLYCOSYLATED HEMOGLOBIN (HGB A1C): Hemoglobin A1C: 9.1 % — AB (ref 4.0–5.6)

## 2018-08-07 MED ORDER — ROSUVASTATIN CALCIUM 5 MG PO TABS: 5.0000 mg | ORAL_TABLET | Freq: Every day | ORAL | 3 refills | Status: DC

## 2018-08-07 MED ORDER — SITAGLIPTIN PHOSPHATE 100 MG PO TABS: 100.0000 mg | ORAL_TABLET | Freq: Every day | ORAL | 5 refills | Status: DC

## 2018-08-07 MED ORDER — AZITHROMYCIN 250 MG PO TABS: 250.0000 mg | ORAL_TABLET | Freq: Every day | ORAL | 5 refills | Status: DC

## 2018-08-07 NOTE — Patient Instructions (Signed)
Diabetes Mellitus and Nutrition, Adult  When you have diabetes (diabetes mellitus), it is very important to have healthy eating habits because your blood sugar (glucose) levels are greatly affected by what you eat and drink. Eating healthy foods in the appropriate amounts, at about the same times every day, can help you:  · Control your blood glucose.  · Lower your risk of heart disease.  · Improve your blood pressure.  · Reach or maintain a healthy weight.  Every person with diabetes is different, and each person has different needs for a meal plan. Your health care provider may recommend that you work with a diet and nutrition specialist (dietitian) to make a meal plan that is best for you. Your meal plan may vary depending on factors such as:  · The calories you need.  · The medicines you take.  · Your weight.  · Your blood glucose, blood pressure, and cholesterol levels.  · Your activity level.  · Other health conditions you have, such as heart or kidney disease.  How do carbohydrates affect me?  Carbohydrates, also called carbs, affect your blood glucose level more than any other type of food. Eating carbs naturally raises the amount of glucose in your blood. Carb counting is a method for keeping track of how many carbs you eat. Counting carbs is important to keep your blood glucose at a healthy level, especially if you use insulin or take certain oral diabetes medicines.  It is important to know how many carbs you can safely have in each meal. This is different for every person. Your dietitian can help you calculate how many carbs you should have at each meal and for each snack.  Foods that contain carbs include:  · Bread, cereal, rice, pasta, and crackers.  · Potatoes and corn.  · Peas, beans, and lentils.  · Milk and yogurt.  · Fruit and juice.  · Desserts, such as cakes, cookies, ice cream, and candy.  How does alcohol affect me?  Alcohol can cause a sudden decrease in blood glucose (hypoglycemia),  especially if you use insulin or take certain oral diabetes medicines. Hypoglycemia can be a life-threatening condition. Symptoms of hypoglycemia (sleepiness, dizziness, and confusion) are similar to symptoms of having too much alcohol.  If your health care provider says that alcohol is safe for you, follow these guidelines:  · Limit alcohol intake to no more than 1 drink per day for nonpregnant women and 2 drinks per day for men. One drink equals 12 oz of beer, 5 oz of wine, or 1½ oz of hard liquor.  · Do not drink on an empty stomach.  · Keep yourself hydrated with water, diet soda, or unsweetened iced tea.  · Keep in mind that regular soda, juice, and other mixers may contain a lot of sugar and must be counted as carbs.  What are tips for following this plan?    Reading food labels  · Start by checking the serving size on the "Nutrition Facts" label of packaged foods and drinks. The amount of calories, carbs, fats, and other nutrients listed on the label is based on one serving of the item. Many items contain more than one serving per package.  · Check the total grams (g) of carbs in one serving. You can calculate the number of servings of carbs in one serving by dividing the total carbs by 15. For example, if a food has 30 g of total carbs, it would be equal to 2   servings of carbs.  · Check the number of grams (g) of saturated and trans fats in one serving. Choose foods that have low or no amount of these fats.  · Check the number of milligrams (mg) of salt (sodium) in one serving. Most people should limit total sodium intake to less than 2,300 mg per day.  · Always check the nutrition information of foods labeled as "low-fat" or "nonfat". These foods may be higher in added sugar or refined carbs and should be avoided.  · Talk to your dietitian to identify your daily goals for nutrients listed on the label.  Shopping  · Avoid buying canned, premade, or processed foods. These foods tend to be high in fat, sodium,  and added sugar.  · Shop around the outside edge of the grocery store. This includes fresh fruits and vegetables, bulk grains, fresh meats, and fresh dairy.  Cooking  · Use low-heat cooking methods, such as baking, instead of high-heat cooking methods like deep frying.  · Cook using healthy oils, such as olive, canola, or sunflower oil.  · Avoid cooking with butter, cream, or high-fat meats.  Meal planning  · Eat meals and snacks regularly, preferably at the same times every day. Avoid going long periods of time without eating.  · Eat foods high in fiber, such as fresh fruits, vegetables, beans, and whole grains. Talk to your dietitian about how many servings of carbs you can eat at each meal.  · Eat 4-6 ounces (oz) of lean protein each day, such as lean meat, chicken, fish, eggs, or tofu. One oz of lean protein is equal to:  ? 1 oz of meat, chicken, or fish.  ? 1 egg.  ? ¼ cup of tofu.  · Eat some foods each day that contain healthy fats, such as avocado, nuts, seeds, and fish.  Lifestyle  · Check your blood glucose regularly.  · Exercise regularly as told by your health care provider. This may include:  ? 150 minutes of moderate-intensity or vigorous-intensity exercise each week. This could be brisk walking, biking, or water aerobics.  ? Stretching and doing strength exercises, such as yoga or weightlifting, at least 2 times a week.  · Take medicines as told by your health care provider.  · Do not use any products that contain nicotine or tobacco, such as cigarettes and e-cigarettes. If you need help quitting, ask your health care provider.  · Work with a counselor or diabetes educator to identify strategies to manage stress and any emotional and social challenges.  Questions to ask a health care provider  · Do I need to meet with a diabetes educator?  · Do I need to meet with a dietitian?  · What number can I call if I have questions?  · When are the best times to check my blood glucose?  Where to find more  information:  · American Diabetes Association: diabetes.org  · Academy of Nutrition and Dietetics: www.eatright.org  · National Institute of Diabetes and Digestive and Kidney Diseases (NIH): www.niddk.nih.gov  Summary  · A healthy meal plan will help you control your blood glucose and maintain a healthy lifestyle.  · Working with a diet and nutrition specialist (dietitian) can help you make a meal plan that is best for you.  · Keep in mind that carbohydrates (carbs) and alcohol have immediate effects on your blood glucose levels. It is important to count carbs and to use alcohol carefully.  This information is not intended to   replace advice given to you by your health care provider. Make sure you discuss any questions you have with your health care provider.  Document Released: 02/22/2005 Document Revised: 12/26/2016 Document Reviewed: 07/02/2016  Elsevier Interactive Patient Education © 2019 [employer] Inc[employer]

## 2018-08-07 NOTE — Progress Notes (Signed)
Patient: Whitney Fleming Female    DOB: December 13, 1945   73 y.o.   MRN: [id number] Visit Date: 08/08/2018  Today's Provider: Lavon Paganini, MD   Chief Complaint  Patient presents with  . Diabetes  . Hypertension  . Hyperlipidemia   Subjective:     HPI  Diabetes Mellitus Type II, Follow-up:       Lab Results  Component Value Date   HGBA1C 9.1 (A) 05/01/2018   HGBA1C 8.8 (A) 01/14/2018   HGBA1C 8.1 (H) 09/27/2017     Last seen for diabetes38monthago.  Management since then includesadded Trulicity 03.29mg, advised to continue Metformin and Jardiance. Shereports goodcompliance with treatment. Sheis having side effects. Patient reports buring sensation in abdomen where she is taking Trulicity. Current symptoms includenone Home blood sugar records:150-200's  Episodes of hypoglycemia?no  Current Insulin Regimen:Trulicity stopped taking last Sunday Most Recent Eye Exam:04/22/2018 Weight trend:stable Prior visit with dietician:no Current diet:well balanced Current exercise:none  Pertinent Labs: Lab Results  Component Value Date   CHOL 110 01/17/2018   HDL 37 (L) 01/17/2018   LDLCALC 52 01/17/2018   TRIG 103 01/17/2018   CHOLHDL 3.0 01/17/2018   Wt Readings from Last 3 Encounters:  08/07/18 165 lb 6.4 oz (75 kg)  05/29/18 169 lb 8 oz (76.9 kg)  05/01/18 171 lb (77.6 kg)   ------------------------------------------------------------------------   Hypertension, follow-up: BP Readings from Last 3 Encounters:  08/07/18 140/79  05/29/18 106/62  05/01/18 134/82   She was last seen for hypertension 3 months ago.  BP at that visit was 134/82. Management since that visit includes no changes She reports good compliance with treatment. She is not having side effects.  She is not exercising. She is adherent to low salt diet.   Outside blood pressures are being checked at home. She is experiencing none.  Patient denies chest pain,  chest pressure/discomfort, claudication, dyspnea, exertional chest pressure/discomfort, fatigue, irregular heart beat, lower extremity edema, near-syncope, orthopnea, palpitations, paroxysmal nocturnal dyspnea, syncope and tachypnea.   Cardiovascular risk factors include advanced age (older than 564for men, 673for women), diabetes mellitus, dyslipidemia, hypertension and obesity (BMI >= 30 kg/m2).  Use of agents associated with hypertension: none.   ------------------------------------------------------------------------  Lipid/Cholesterol, Follow-up:   Last seen for this 3 months ago.  Management since that visit includes no change.  Last Lipid Panel: Lab Results  Component Value Date   CHOL 110 01/17/2018   HDL 37 (L) 01/17/2018   LDLCALC 52 01/17/2018   TRIG 103 01/17/2018   CHOLHDL 3.0 01/17/2018   She reports good compliance with treatment. She is not having side effects.      Wt Readings from Last 3 Encounters:  05/29/18 169 lb 8 oz (76.9 kg)  05/01/18 171 lb (77.6 kg)  04/25/18 167 lb (75.8 kg)   States she needs refills on Crestor ----------------------------------------------------------------------- States that Azithromycin - Rx by Pulm for COPD - is being denied at the pharmacy.  Would like refill today   No Known Allergies   Current Outpatient Medications:  .  ACCU-CHEK FASTCLIX LANCETS MISC, Use as directed to check blood glucose twice daily, Disp: 100 each, Rfl: 3 .  AMBULATORY NON FORMULARY MEDICATION, Medication Name: Aero chamber use as directed. 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  Blood Glucose Monitoring Suppl (ACCU-CHEK NANO SMARTVIEW) w/Device KIT, Use as directed to check blood glucose twice daily, Disp: 1 kit, Rfl: 0 .  budesonide (PULMICORT) 0.5 MG/2ML nebulizer solution, Take 2 mLs (0.5 mg  total) by nebulization 2 (two) times daily., Disp: 75 mL, Rfl: 12 .  budesonide-formoterol (SYMBICORT) 160-4.5 MCG/ACT inhaler, Inhale 2 puffs into the lungs  2 (two) times daily., Disp: 1 Inhaler, Rfl: 6 .  carvedilol (COREG) 12.5 MG tablet, TAKE 1 TABLET BY MOUTH TWICE DAILY WITH A MEAL, Disp: 60 tablet, Rfl: 3 .  glucose blood (ACCU-CHEK SMARTVIEW) test strip, Use as directed to check blood glucose twice daily, Disp: 100 each, Rfl: 3 .  Insulin Pen Needle (NOVOFINE) 32G X 6 MM MISC, Use as directed to inject insulin daily, Disp: 100 each, Rfl: 3 .  JARDIANCE 25 MG TABS tablet, TAKE 1 TABLET BY MOUTH ONCE DAILY, Disp: 30 tablet, Rfl: 5 .  levalbuterol (XOPENEX) 1.25 MG/3ML nebulizer solution, Take 1.25 mg by nebulization every 6 (six) hours as needed for wheezing. DX:J44.9, Disp: 216 mL, Rfl: 5 .  losartan (COZAAR) 50 MG tablet, Take 1 tablet (50 mg total) by mouth daily., Disp: 90 tablet, Rfl: 3 .  Mepolizumab (NUCALA) 100 MG/ML SOAJ, Inject 100 mg into the skin every 30 (thirty) days., Disp: 3 Syringe, Rfl: 2 .  metFORMIN (GLUCOPHAGE) 1000 MG tablet, Take 1 tablet (1,000 mg total) by mouth 2 (two) times daily with a meal., Disp: 180 tablet, Rfl: 3 .  montelukast (SINGULAIR) 10 MG tablet, TAKE 1 TABLET BY MOUTH AT BEDTIME, Disp: 30 tablet, Rfl: 0 .  PRESCRIPTION MEDICATION, **Non-US market drug** Zyrova C (rosuvastatin/clopidigrel) Take 1 capsule by mouth every evening, Disp: , Rfl:  .  rosuvastatin (CRESTOR) 5 MG tablet, Take 1 tablet (5 mg total) by mouth daily., Disp: 90 tablet, Rfl: 3 .  THEO-24 200 MG 24 hr capsule, TAKE 1 CAPSULE BY MOUTH ONCE DAILY, Disp: 90 capsule, Rfl: 2 .  azithromycin (ZITHROMAX) 250 MG tablet, Take 1 tablet (250 mg total) by mouth daily. Take one tablet every Monday, Wednesday, Friday, Disp: 13 tablet, Rfl: 5 .  sitaGLIPtin (JANUVIA) 100 MG tablet, Take 1 tablet (100 mg total) by mouth daily., Disp: 30 tablet, Rfl: 5  Review of Systems  Constitutional: Negative.   Respiratory: Negative.   Cardiovascular: Negative.   Endocrine: Negative.   Musculoskeletal: Negative.     Social History   Tobacco Use  . Smoking  status: Never Smoker  . Smokeless tobacco: Never Used  Substance Use Topics  . Alcohol use: Never    Frequency: Never      Objective:   BP 140/79 (BP Location: Left Arm, Patient Position: Sitting, Cuff Size: Normal)   Pulse 96   Temp 97.7 F (36.5 C) (Oral)   Wt 165 lb 6.4 oz (75 kg)   SpO2 96%   BMI 34.57 kg/m  Vitals:   08/07/18 1139  BP: 140/79  Pulse: 96  Temp: 97.7 F (36.5 C)  TempSrc: Oral  SpO2: 96%  Weight: 165 lb 6.4 oz (75 kg)     Physical Exam Vitals signs reviewed.  Constitutional:      General: She is not in acute distress.    Appearance: Normal appearance. She is not diaphoretic.  HENT:     Head: Normocephalic and atraumatic.  Eyes:     General: No scleral icterus.    Conjunctiva/sclera: Conjunctivae normal.  Neck:     Musculoskeletal: Neck supple.  Cardiovascular:     Rate and Rhythm: Normal rate and regular rhythm.     Heart sounds: Normal heart sounds. No murmur.  Pulmonary:     Effort: Pulmonary effort is normal. No respiratory distress.  Breath sounds: Normal breath sounds. No wheezing or rhonchi.  Abdominal:     General: There is no distension.     Palpations: Abdomen is soft.     Tenderness: There is no abdominal tenderness.  Musculoskeletal:     Right lower leg: No edema.     Left lower leg: No edema.  Lymphadenopathy:     Cervical: No cervical adenopathy.  Skin:    General: Skin is warm and dry.     Capillary Refill: Capillary refill takes less than 2 seconds.     Findings: No rash.  Neurological:     Mental Status: She is alert and oriented to person, place, and time. Mental status is at baseline.  Psychiatric:        Mood and Affect: Mood normal.        Behavior: Behavior normal.      Results for orders placed or performed in visit on 08/07/18  POCT HgB A1C  Result Value Ref Range   Hemoglobin A1C 9.1 (A) 4.0 - 5.6 %   HbA1c POC (<> result, manual entry)     HbA1c, POC (prediabetic range)     HbA1c, POC  (controlled diabetic range)         Assessment & Plan   Problem List Items Addressed This Visit      Cardiovascular and Mediastinum   HTN (hypertension)    Well controlled Higher goal given advanced age Continue current medications at current doses She was previously on HCTZ, but she is not requiring this at this time Reviewed recent metabolic panel      Relevant Medications   rosuvastatin (CRESTOR) 5 MG tablet     Respiratory   COPD (chronic obstructive pulmonary disease) (HCC)    COPD/asthma overlap syndrome Multiple recent exacerbations She actually sounds well controlled today She is followed by pulmonology On triple therapy, theophylline, azithromycin Will refill azithromycin      Relevant Medications   azithromycin (ZITHROMAX) 250 MG tablet     Endocrine   Diabetes ([place]) - Primary    Uncontrolled A1c remains elevated at 9.1 She is not currently on Lantus, but has taken this in the past Continue metformin 1000 mg twice daily and Jardiance 25 mg daily Patient is not agreeable to any GLP-1 therapy because she reports that the shots hurt too much She self discontinued Bydureon We will add Januvia Would like to avoid sulfonylurea therapy given her risk for hypoglycemia at her age In the future, however if she is going to be on steroids again, she will benefit from glipizide 5 mg twice daily while on steroids She is on losartan She is up-to-date on foot exam and vaccinations Discussed that if A1c does not reduce with Januvia, we will need to consider resuming Lantus      Relevant Medications   sitaGLIPtin (JANUVIA) 100 MG tablet   rosuvastatin (CRESTOR) 5 MG tablet   Other Relevant Orders   POCT HgB A1C (Completed)   Hyperlipidemia associated with type 2 diabetes mellitus ([place])    Previously well controlled Continue Crestor at current dose      Relevant Medications   sitaGLIPtin (JANUVIA) 100 MG tablet   rosuvastatin (CRESTOR) 5 MG tablet       Return  in about 3 months (around 11/05/2018) for diabetes f/u.   The entirety of the information documented in the History of Present Illness, Review of Systems and Physical Exam were personally obtained by me. Portions of this information were initially documented by  Tiburcio Pea, CMA and reviewed by me for thoroughness and accuracy.    [name], MD, MPH [facility] 08/08/2018 12:57 PM

## 2018-08-07 NOTE — Assessment & Plan Note (Addendum)
Well controlled Higher goal given advanced age Continue current medications at current doses She was previously on HCTZ, but she is not requiring this at this time Reviewed recent metabolic panel

## 2018-08-08 NOTE — Assessment & Plan Note (Signed)
COPD/asthma overlap syndrome Multiple recent exacerbations She actually sounds well controlled today She is followed by pulmonology On triple therapy, theophylline, azithromycin Will refill azithromycin

## 2018-08-08 NOTE — Assessment & Plan Note (Signed)
Uncontrolled A1c remains elevated at 9.1 She is not currently on Lantus, but has taken this in the past Continue metformin 1000 mg twice daily and Jardiance 25 mg daily Patient is not agreeable to any GLP-1 therapy because she reports that the shots hurt too much She self discontinued Bydureon We will add Januvia Would like to avoid sulfonylurea therapy given her risk for hypoglycemia at her age In the future, however if she is going to be on steroids again, she will benefit from glipizide 5 mg twice daily while on steroids She is on losartan She is up-to-date on foot exam and vaccinations Discussed that if A1c does not reduce with Januvia, we will need to consider resuming Lantus

## 2018-08-08 NOTE — Assessment & Plan Note (Signed)
Previously well controlled Continue Crestor at current dose 

## 2018-08-12 ENCOUNTER — Other Ambulatory Visit: Payer: Self-pay | Admitting: Family Medicine

## 2018-08-12 DIAGNOSIS — H9193 Unspecified hearing loss, bilateral: Secondary | ICD-10-CM

## 2018-08-20 ENCOUNTER — Telehealth: Payer: Self-pay | Admitting: Internal Medicine

## 2018-08-20 NOTE — Telephone Encounter (Signed)
We have documentation that the pt's Nucala has been approved. Approval #: [id number]. Approval date: 03/16/2018 - 03/11/2019.  We need to find out if the pt wants to pursue starting this medication. Per documentation, [name] does all of her language interpretation.  Spoke with [employer]. States that her mother is feeling better and does not want to start this medication. Nothing further was needed at this time.

## 2018-08-22 ENCOUNTER — Other Ambulatory Visit: Payer: Self-pay | Admitting: Family Medicine

## 2018-08-22 MED ORDER — CARVEDILOL 12.5 MG PO TABS: ORAL_TABLET | ORAL | 3 refills | Status: DC

## 2018-08-22 NOTE — Telephone Encounter (Signed)
Pt needs a refill   Carvedilol 12/5 mg  [place]  [employer]

## 2018-08-27 ENCOUNTER — Other Ambulatory Visit: Payer: Self-pay | Admitting: Internal Medicine

## 2018-09-04 ENCOUNTER — Telehealth: Payer: Self-pay | Admitting: Internal Medicine

## 2018-09-04 NOTE — Telephone Encounter (Signed)
Lm to confirm upcoming visit, give directions and do covid-19 screening.

## 2018-09-10 NOTE — Telephone Encounter (Signed)
Pt has been scheduled for phone visit 09/11/18.

## 2018-09-11 ENCOUNTER — Ambulatory Visit (INDEPENDENT_AMBULATORY_CARE_PROVIDER_SITE_OTHER): Payer: Medicaid Other | Admitting: Internal Medicine

## 2018-09-11 DIAGNOSIS — J4550 Severe persistent asthma, uncomplicated: Secondary | ICD-10-CM

## 2018-09-11 DIAGNOSIS — R0609 Other forms of dyspnea: Secondary | ICD-10-CM | POA: Diagnosis not present

## 2018-09-11 NOTE — Progress Notes (Signed)
* [facility]   Virtual Visit via Telephone Note I connected with pt on 09/11/18 at  4:00 PM EDT by telephone and verified that I am speaking with the correct person using two identifiers.   I discussed the limitations, risks, security and privacy concerns of performing an evaluation and management service by telephone and the availability of in person appointments. I also discussed with the patient that there may be a patient responsible charge related to this service. The patient expressed understanding and agreed to proceed. I discussed the assessment and treatment plan with the patient. The patient was provided an opportunity to ask questions and all were answered. The patient agreed with the plan and demonstrated an understanding of the instructions. Please see note below for further detail.    The patient was advised to call back or seek an in-person evaluation if the symptoms worsen or if the condition fails to improve as anticipated.  I provided 11 minutes of non-face-to-face time during this encounter.   [name], MD    Assessment and Plan:  Asthma/chronic bronchitis with multiple exacerbations over the last few months. -Severe persistent asthma, daily symptoms a high risk of complications. - Elevated eosinophil count of 300. - Patient may be a candidate for Nucala, which was arranged but the patient declined due to not wanting to do injections.   - We will maintain the patient on Pulmicort and Xopenex both twice daily by nebulizers. She is informed that she can do extra xopenex treatments as needed is she has wheezing.    Chronic bronchitis/COPD with multiple exacerbations and numerous hospital admissions. - Continue azithromycin 250 mg every Monday Wednesday Friday.  Dyspnea on exertion. - Secondary to above.  Continue above medications.    Return in about 6 months (around 03/13/2019).    Date: 09/11/2018  MRN# [id number] Whitney Fleming  05-14-1946   Whitney Fleming is a 73 y.o. old female seen in follow up for chief complaint of dyspnea.   HPI:  The patient is a 73 year old female with severe allergic asthma.  She has had 3 admissions to the hospital in 2019 with asthma exacerbations including most recently in September 2019.  At last visit she was started on theophylline, and was referred to be started on Nucala.  She was continued on her triple nebulized Pulmicort and Xopenex, which she gets from [place].  She was asked to continue azithromycin 3 times weekly, and started on Nucala. Spoke with daughter, feels that breathing is doing well. She is using her nebulizers twice daily. She is not taking Nucala and was never started because the patient declined injection. She has continued taking azithro  She is taking azithro every M,W,F and has not been hospitalized again since starting this.   **IgE 12/24/17; IGe 5.  **CXR 11/05/17; changes of chronic bronchitis, right heart enlargement.  **CBC 02/11/18>> absolute eosinophil count 800. **CBC 09/25/17; Abs eos count 300.    Medication:    Current Outpatient Medications:  .  ACCU-CHEK FASTCLIX LANCETS MISC, Use as directed to check blood glucose twice daily, Disp: 100 each, Rfl: 3 .  AMBULATORY NON FORMULARY MEDICATION, Medication Name: Aero chamber use as directed. 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  azithromycin (ZITHROMAX) 250 MG tablet, Take 1 tablet (250 mg total) by mouth daily. Take one tablet every Monday, Wednesday, Friday, Disp: 13 tablet, Rfl: 5 .  Blood Glucose Monitoring Suppl (ACCU-CHEK NANO SMARTVIEW) w/Device KIT, Use as directed to check blood glucose twice daily, Disp:  1 kit, Rfl: 0 .  budesonide (PULMICORT) 0.5 MG/2ML nebulizer solution, Take 2 mLs (0.5 mg total) by nebulization 2 (two) times daily., Disp: 75 mL, Rfl: 12 .  budesonide-formoterol (SYMBICORT) 160-4.5 MCG/ACT inhaler, Inhale 2 puffs into the lungs 2 (two) times daily., Disp: 1 Inhaler, Rfl: 6 .  carvedilol  (COREG) 12.5 MG tablet, TAKE 1 TABLET BY MOUTH TWICE DAILY WITH A MEAL, Disp: 60 tablet, Rfl: 3 .  glucose blood (ACCU-CHEK SMARTVIEW) test strip, Use as directed to check blood glucose twice daily, Disp: 100 each, Rfl: 3 .  Insulin Pen Needle (NOVOFINE) 32G X 6 MM MISC, Use as directed to inject insulin daily, Disp: 100 each, Rfl: 3 .  JARDIANCE 25 MG TABS tablet, TAKE 1 TABLET BY MOUTH ONCE DAILY, Disp: 30 tablet, Rfl: 5 .  levalbuterol (XOPENEX) 1.25 MG/3ML nebulizer solution, Take 1.25 mg by nebulization every 6 (six) hours as needed for wheezing. DX:J44.9, Disp: 216 mL, Rfl: 5 .  losartan (COZAAR) 50 MG tablet, Take 1 tablet (50 mg total) by mouth daily., Disp: 90 tablet, Rfl: 3 .  Mepolizumab (NUCALA) 100 MG/ML SOAJ, Inject 100 mg into the skin every 30 (thirty) days., Disp: 3 Syringe, Rfl: 2 .  metFORMIN (GLUCOPHAGE) 1000 MG tablet, Take 1 tablet (1,000 mg total) by mouth 2 (two) times daily with a meal., Disp: 180 tablet, Rfl: 3 .  montelukast (SINGULAIR) 10 MG tablet, TAKE 1 TABLET BY MOUTH AT BEDTIME, Disp: 30 tablet, Rfl: 0 .  PRESCRIPTION MEDICATION, **Non-US market drug** Zyrova C (rosuvastatin/clopidigrel) Take 1 capsule by mouth every evening, Disp: , Rfl:  .  rosuvastatin (CRESTOR) 5 MG tablet, Take 1 tablet (5 mg total) by mouth daily., Disp: 90 tablet, Rfl: 3 .  sitaGLIPtin (JANUVIA) 100 MG tablet, Take 1 tablet (100 mg total) by mouth daily., Disp: 30 tablet, Rfl: 5 .  THEO-24 200 MG 24 hr capsule, TAKE 1 CAPSULE BY MOUTH ONCE DAILY, Disp: 90 capsule, Rfl: 2   Allergies:  Patient has no known allergies.   Review of Systems:  Constitutional: Feels well. Cardiovascular: Denies chest pain, exertional chest pain.  Pulmonary: Denies hemoptysis, pleuritic chest pain.   The remainder of systems were reviewed and were found to be negative other than what is documented in the HPI.     LABORATORY PANEL:   CBC No results for input(s): WBC, HGB, HCT, PLT in the last 168 hours.  ------------------------------------------------------------------------------------------------------------------  Chemistries  No results for input(s): NA, K, CL, CO2, GLUCOSE, BUN, CREATININE, CALCIUM, MG, AST, ALT, ALKPHOS, BILITOT in the last 168 hours.  Invalid input(s): GFRCGP ------------------------------------------------------------------------------------------------------------------  Cardiac Enzymes No results for input(s): TROPONINI in the last 168 hours. ------------------------------------------------------------  RADIOLOGY:   No results found for this or any previous visit. Results for orders placed during the hospital encounter of 11/05/17  DG Chest 2 View   Narrative CLINICAL DATA:  Worsening shortness of breath since last night. History of COPD with current symptoms not responsive to medication. Minimally productive cough.  EXAM: CHEST - 2 VIEW  COMPARISON:  Portable chest x-ray of September 25, 2017  FINDINGS: The lungs are adequately inflated. The interstitial markings are coarse. There is no alveolar infiltrate. There is no pleural effusion. The heart is top-normal in size. The pulmonary vascularity is prominent centrally on the right but stable. There is calcification in the wall of the aortic arch. The bony thorax is unremarkable.  IMPRESSION: Chronic bronchitic changes with possible superimposed acute bronchitis. No alveolar pneumonia nor CHF.  Thoracic  aortic atherosclerosis.   Electronically Signed   By: David  Martinique M.D.   On: 11/05/2017 17:01    ------------------------------------------------------------------------------------------------------------------  Thank  you for allowing [facility] to assist in the care of your patient. Our recommendations are noted above.  Please contact us if we can be of further service.   Marda Stalker, M.D., F.C.C.P.  Board Certified in Internal Medicine, Pulmonary  Medicine, [place], and Sleep Medicine.  [facility] Office Number: [phone]  09/11/2018

## 2018-09-18 ENCOUNTER — Telehealth: Payer: Self-pay | Admitting: Family Medicine

## 2018-09-18 ENCOUNTER — Other Ambulatory Visit: Payer: Self-pay | Admitting: Internal Medicine

## 2018-09-18 NOTE — Telephone Encounter (Signed)
Pt's daughter called wanting to know if her mother can get all her prescriptions sent in for three months at a time instead of one month at a time. She does not need a refill right now  She does like going to [facility] every month.  CB#  [phone]   Thanks Con Memos

## 2018-09-18 NOTE — Telephone Encounter (Signed)
lmtcb x1 for pt's daughter, [name](DPR). Need to verify what Rx are needed.

## 2018-09-18 NOTE — Telephone Encounter (Signed)
Patient's daughter called, needs xopenex, azithromycin and pulmicort refilled with 90 day supply.    Called and spoke to [employer], they stated that since the patient has medicaid, they can only do 30 days a time. If she wishes to get 90 days, 30 would be covered by insurance and the other 60 would be self pay.   Called and spoke to daughter, she will leave as is for now, so that it can go through insurance. Nothing further needed at this time.

## 2018-09-22 MED ORDER — MONTELUKAST SODIUM 10 MG PO TABS: 10.0000 mg | ORAL_TABLET | Freq: Every day | ORAL | 1 refills | Status: DC

## 2018-09-22 MED ORDER — LOSARTAN POTASSIUM 50 MG PO TABS: 50.0000 mg | ORAL_TABLET | Freq: Every day | ORAL | 3 refills | Status: DC

## 2018-09-22 MED ORDER — CARVEDILOL 12.5 MG PO TABS: ORAL_TABLET | ORAL | 1 refills | Status: DC

## 2018-09-22 MED ORDER — ROSUVASTATIN CALCIUM 5 MG PO TABS: 5.0000 mg | ORAL_TABLET | Freq: Every day | ORAL | 3 refills | Status: DC

## 2018-09-22 MED ORDER — SITAGLIPTIN PHOSPHATE 100 MG PO TABS: 100.0000 mg | ORAL_TABLET | Freq: Every day | ORAL | 1 refills | Status: DC

## 2018-09-22 MED ORDER — METFORMIN HCL 1000 MG PO TABS: 1000.0000 mg | ORAL_TABLET | Freq: Two times a day (BID) | ORAL | 3 refills | Status: DC

## 2018-09-22 MED ORDER — EMPAGLIFLOZIN 25 MG PO TABS: 25.0000 mg | ORAL_TABLET | Freq: Every day | ORAL | 1 refills | Status: DC

## 2018-09-22 NOTE — Telephone Encounter (Signed)
90 day supplies with refills of all the meds Rx'd by me sent to pharmacy.  She should check wth Dr Juanell Fairly about her Pulm meds as weel.

## 2018-09-22 NOTE — Telephone Encounter (Signed)
Patient's daughter advised. She did contacted Pulm for refills.

## 2018-09-30 ENCOUNTER — Other Ambulatory Visit: Payer: Self-pay

## 2018-09-30 NOTE — Telephone Encounter (Signed)
Patient's daughter is requesting a refill on Z-pak and Prednisone be sent to [facility]. She states patient needs Prednisone for COPD flare. Daughter is also asking if patient can start taking Z-pak daily instead of 3 times weekly.

## 2018-10-01 NOTE — Telephone Encounter (Signed)
Patient's daughter called requesting a Z-Pak for her mother. She would also like her to possibly have Prednisone if you feel that it is necessary. Her daughter would also like to speak with you regarding the medicine used to treat the COVID 19. Patient not having any symptoms of the virus.

## 2018-10-01 NOTE — Telephone Encounter (Signed)
Left message advising patient's daughter that refills needed to be requested from [facility]

## 2018-10-01 NOTE — Telephone Encounter (Signed)
LMTCB

## 2018-10-01 NOTE — Telephone Encounter (Signed)
These medications are managed by her Pulmonologist.  She is on Azithromycin to prevent COPD flares.  Maybe she needs a f/u visit with Pulm.

## 2018-10-02 ENCOUNTER — Telehealth: Payer: Self-pay | Admitting: Internal Medicine

## 2018-10-02 NOTE — Telephone Encounter (Signed)
Called and spoke to pt's daughter, Sherron Flemings ([place]).  [name] is requesting that pt's azithro be increased to 5 days a week, due to pt now having to use her nebulizer TID.  Pt is experiencing increased dry cough.  Sob is baseline.    DR please advise. Thanks

## 2018-10-03 ENCOUNTER — Telehealth: Payer: Self-pay | Admitting: Internal Medicine

## 2018-10-03 MED ORDER — AZITHROMYCIN 250 MG PO TABS: 250.0000 mg | ORAL_TABLET | Freq: Every day | ORAL | 0 refills | Status: DC

## 2018-10-03 NOTE — Telephone Encounter (Signed)
Spoke to patient's daughter to let her know Dr. Ashby Dawes said we may increase azithromycin 250 mg 5 days a week. Per daughter, [facility] has sent them a letter saying they will cover a 90 day supply of medication with medicaid. Rx sent in for 90 days. Nothing further needed at this time.

## 2018-10-03 NOTE — Telephone Encounter (Signed)
Please see previous encounter

## 2018-10-03 NOTE — Telephone Encounter (Signed)
May increase to azithro 250 mg 5 days per week.

## 2018-10-08 ENCOUNTER — Telehealth: Payer: Self-pay | Admitting: Internal Medicine

## 2018-10-08 NOTE — Telephone Encounter (Signed)
Spoke to pt's daughter, Whitney Fleming ([place]). [name] stated that pt is still experiencing dry cough at times prod with yellow mucus. Pt has not increased zithro to 5 times weekly, due to Rx just being picked up from pharmacy yesterday. Pt is using albuterol Q4H, however [name] is unsure if pt has relief in cough after using albuterol.   Sob is baseline.  Denied fever, chills or sweats. [name] is questioning if prednisone could be call in or if pt should be seen.  DR please advise. Thanks

## 2018-10-08 NOTE — Telephone Encounter (Signed)
lmtcb x1 for pt's daughter. 

## 2018-10-08 NOTE — Telephone Encounter (Signed)
Spoke to patient's daughter (per DPR) and let her know of Dr. Enid Derry recommendation to stay on azithro 5 days this week and then let us know how she is doing after the weekend. Whitney Fleming is aware. Nothing further at this time.

## 2018-10-08 NOTE — Telephone Encounter (Signed)
Use the azithro this week and see how she is doing after the weekend.

## 2018-10-08 NOTE — Telephone Encounter (Signed)
Error

## 2018-10-08 NOTE — Telephone Encounter (Signed)
lmtcb x2 for pt's daughter. 

## 2018-10-08 NOTE — Telephone Encounter (Signed)
Pt's daughter called back.

## 2018-10-09 ENCOUNTER — Telehealth: Payer: Self-pay | Admitting: Internal Medicine

## 2018-10-09 NOTE — Telephone Encounter (Addendum)
Called and spoke to pt's daughter, Sherron Flemings.  [name] is requesting an appt for pt, due to sob & cough. Denied fever, chills, sweats, recent travel or sick contacts.  Pt has been scheduled for acute visit 10/10/2018 at 9:15a. Pt has been screened. Okay to proceed with visit. Nothing further is needed at this time.

## 2018-10-09 NOTE — Telephone Encounter (Signed)
DR received message that pt had contacted answering service.  Left message for pt's daughter.

## 2018-10-10 ENCOUNTER — Emergency Department: Payer: Medicaid Other

## 2018-10-10 ENCOUNTER — Other Ambulatory Visit: Payer: Self-pay

## 2018-10-10 ENCOUNTER — Emergency Department
Admission: EM | Admit: 2018-10-10 | Discharge: 2018-10-10 | Disposition: A | Discharge status: Home / Self Care | Payer: Medicaid Other | Attending: Emergency Medicine | Admitting: Emergency Medicine

## 2018-10-10 ENCOUNTER — Other Ambulatory Visit: Payer: Self-pay | Admitting: Family Medicine

## 2018-10-10 ENCOUNTER — Ambulatory Visit (INDEPENDENT_AMBULATORY_CARE_PROVIDER_SITE_OTHER): Payer: Medicaid Other | Admitting: Internal Medicine

## 2018-10-10 ENCOUNTER — Encounter: Payer: Self-pay | Admitting: Internal Medicine

## 2018-10-10 VITALS — BP 114/72 | HR 81 | Ht <= 58 in | Wt 163.2 lb

## 2018-10-10 DIAGNOSIS — Z79899 Other long term (current) drug therapy: Secondary | ICD-10-CM | POA: Insufficient documentation

## 2018-10-10 DIAGNOSIS — Z7984 Long term (current) use of oral hypoglycemic drugs: Secondary | ICD-10-CM | POA: Diagnosis not present

## 2018-10-10 DIAGNOSIS — J441 Chronic obstructive pulmonary disease with (acute) exacerbation: Secondary | ICD-10-CM | POA: Diagnosis not present

## 2018-10-10 DIAGNOSIS — J4550 Severe persistent asthma, uncomplicated: Secondary | ICD-10-CM | POA: Diagnosis not present

## 2018-10-10 DIAGNOSIS — R0609 Other forms of dyspnea: Secondary | ICD-10-CM | POA: Diagnosis not present

## 2018-10-10 DIAGNOSIS — I10 Essential (primary) hypertension: Secondary | ICD-10-CM | POA: Insufficient documentation

## 2018-10-10 DIAGNOSIS — E119 Type 2 diabetes mellitus without complications: Secondary | ICD-10-CM | POA: Diagnosis not present

## 2018-10-10 DIAGNOSIS — R0602 Shortness of breath: Secondary | ICD-10-CM | POA: Diagnosis not present

## 2018-10-10 DIAGNOSIS — Z20828 Contact with and (suspected) exposure to other viral communicable diseases: Secondary | ICD-10-CM | POA: Diagnosis not present

## 2018-10-10 DIAGNOSIS — J45909 Unspecified asthma, uncomplicated: Secondary | ICD-10-CM | POA: Insufficient documentation

## 2018-10-10 DIAGNOSIS — R05 Cough: Secondary | ICD-10-CM | POA: Diagnosis not present

## 2018-10-10 LAB — CBC
HCT: 43.6 % (ref 36.0–46.0)
Hemoglobin: 13.6 g/dL (ref 12.0–15.0)
MCH: 25.6 pg — ABNORMAL LOW (ref 26.0–34.0)
MCHC: 31.2 g/dL (ref 30.0–36.0)
MCV: 82.1 fL (ref 80.0–100.0)
Platelets: 214 10*3/uL (ref 150–400)
RBC: 5.31 MIL/uL — ABNORMAL HIGH (ref 3.87–5.11)
RDW: 13.9 % (ref 11.5–15.5)
WBC: 10.9 10*3/uL — ABNORMAL HIGH (ref 4.0–10.5)
nRBC: 0 % (ref 0.0–0.2)

## 2018-10-10 LAB — COMPREHENSIVE METABOLIC PANEL
ALT: 11 U/L (ref 0–44)
AST: 13 U/L — ABNORMAL LOW (ref 15–41)
Albumin: 3.8 g/dL (ref 3.5–5.0)
Alkaline Phosphatase: 65 U/L (ref 38–126)
Anion gap: 11 (ref 5–15)
BUN: 23 mg/dL (ref 8–23)
CO2: 21 mmol/L — ABNORMAL LOW (ref 22–32)
Calcium: 8.8 mg/dL — ABNORMAL LOW (ref 8.9–10.3)
Chloride: 100 mmol/L (ref 98–111)
Creatinine, Ser: 1.3 mg/dL — ABNORMAL HIGH (ref 0.44–1.00)
GFR calc Af Amer: 47 mL/min — ABNORMAL LOW (ref >60)
GFR calc non Af Amer: 40 mL/min — ABNORMAL LOW (ref >60)
Glucose, Bld: 278 mg/dL — ABNORMAL HIGH (ref 70–99)
Potassium: 4.8 mmol/L (ref 3.5–5.1)
Sodium: 132 mmol/L — ABNORMAL LOW (ref 135–145)
Total Bilirubin: 0.7 mg/dL (ref 0.3–1.2)
Total Protein: 6.7 g/dL (ref 6.5–8.1)

## 2018-10-10 LAB — TROPONIN I: Troponin I: <0.03 ng/mL (ref <0.03)

## 2018-10-10 MED ORDER — GUAIFENESIN ER 600 MG PO TB12: 600.0000 mg | ORAL_TABLET | Freq: Two times a day (BID) | ORAL | 0 refills | Status: AC

## 2018-10-10 MED ORDER — METHYLPREDNISOLONE SODIUM SUCC 125 MG IJ SOLR
125.0000 mg | Freq: Once | INTRAMUSCULAR | Status: AC
  Administered 2018-10-10: 11:00:00 125 mg via INTRAVENOUS
  Filled 2018-10-10 (×2): qty 2

## 2018-10-10 MED ORDER — PREDNISONE 10 MG PO TABS: 10.0000 mg | ORAL_TABLET | Freq: Every day | ORAL | 0 refills | Status: DC

## 2018-10-10 NOTE — Progress Notes (Signed)
* [facility]  [name], MD    Assessment and Plan: Acute severe asthma with acute bronchitis and acute asthma exacerbation. Asthma/chronic bronchitis with multiple exacerbations. -Severe persistent asthma, daily symptoms a high risk of complications.  Now with increased dyspnea, cough, wheezing. - She has not been taking her Symbicort regularly, she is taking Xopenex 3-4 times daily with little relief. - Patient is a high risk of complications and I recommended that she go to the emergency room immediately for nebulizer treatments and steroids.  She may require admission and testing for COVID-19. - Patient had stopped her Symbicort inhaler, she needs to restart this 2 puffs twice daily.   Chronic bronchitis/COPD with multiple exacerbations and numerous hospital admissions. - Continue azithromycin 250 mg 5 days/week.  Dyspnea on exertion. - Secondary to above.  Continue above medications.    Return in about 1 month (around 11/10/2018).    Date: 10/10/2018  MRN# [id number] Whitney Fleming 03-04-1946   Whitney Fleming is a 73 y.o. old female seen in follow up for chief complaint of dyspnea.   HPI:  The patient is a 73 year old female with severe allergic asthma.  She has had 3 admissions to the hospital in 2019 with asthma exacerbations including most recently in September 2019.  At last visit she was started on theophylline, and was referred to be started on Nucala but she declined due to not wanting injections.  Therefore she has been maintained on azithromycin 3 times weekly, recently increased to 5 times weekly. Today she is winded, she is wheezing, she has been having a lot of coughing. She already used her xopenex about 2 years ago.     She has been doing levalbuterol three or 4 times per day. She is no longer using symbicort regularly. She stopped the inhalers/nebs regularly. She was using some meds from [place] but she stopped.   **IgE 12/24/17; IGe 5.   **CXR 11/05/17; changes of chronic bronchitis, right heart enlargement.  **CBC 02/11/18>> absolute eosinophil count 800. **CBC 09/25/17; Abs eos count 300.    Medication:    Current Outpatient Medications:  .  ACCU-CHEK FASTCLIX LANCETS MISC, Use as directed to check blood glucose twice daily, Disp: 100 each, Rfl: 3 .  AMBULATORY NON FORMULARY MEDICATION, Medication Name: Aero chamber use as directed. 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  azithromycin (ZITHROMAX) 250 MG tablet, Take 1 tablet (250 mg total) by mouth daily. Take 1 tablet (250 mg) by mouth 5 days a week., Disp: 60 each, Rfl: 0 .  Blood Glucose Monitoring Suppl (ACCU-CHEK NANO SMARTVIEW) w/Device KIT, Use as directed to check blood glucose twice daily, Disp: 1 kit, Rfl: 0 .  budesonide (PULMICORT) 0.5 MG/2ML nebulizer solution, Take 2 mLs (0.5 mg total) by nebulization 2 (two) times daily., Disp: 75 mL, Rfl: 12 .  budesonide-formoterol (SYMBICORT) 160-4.5 MCG/ACT inhaler, Inhale 2 puffs into the lungs 2 (two) times daily. (Patient not taking: Reported on 10/10/2018), Disp: 1 Inhaler, Rfl: 6 .  carvedilol (COREG) 12.5 MG tablet, TAKE 1 TABLET BY MOUTH TWICE DAILY WITH A MEAL, Disp: 180 tablet, Rfl: 1 .  empagliflozin (JARDIANCE) 25 MG TABS tablet, Take 25 mg by mouth daily., Disp: 90 tablet, Rfl: 1 .  glucose blood (ACCU-CHEK SMARTVIEW) test strip, Use as directed to check blood glucose twice daily, Disp: 100 each, Rfl: 3 .  Insulin Pen Needle (NOVOFINE) 32G X 6 MM MISC, Use as directed to inject insulin daily (Patient not taking: Reported on 10/10/2018), Disp:  100 each, Rfl: 3 .  levalbuterol (XOPENEX) 1.25 MG/3ML nebulizer solution, Take 1.25 mg by nebulization every 6 (six) hours as needed for wheezing. DX:J44.9, Disp: 216 mL, Rfl: 5 .  losartan (COZAAR) 50 MG tablet, Take 1 tablet (50 mg total) by mouth daily., Disp: 90 tablet, Rfl: 3 .  Mepolizumab (NUCALA) 100 MG/ML SOAJ, Inject 100 mg into the skin every 30 (thirty) days., Disp: 3  Syringe, Rfl: 2 .  metFORMIN (GLUCOPHAGE) 1000 MG tablet, Take 1 tablet (1,000 mg total) by mouth 2 (two) times daily with a meal., Disp: 180 tablet, Rfl: 3 .  montelukast (SINGULAIR) 10 MG tablet, Take 1 tablet (10 mg total) by mouth at bedtime., Disp: 90 tablet, Rfl: 1 .  PRESCRIPTION MEDICATION, **Non-US market drug** Zyrova C (rosuvastatin/clopidigrel) Take 1 capsule by mouth every evening, Disp: , Rfl:  .  rosuvastatin (CRESTOR) 5 MG tablet, Take 1 tablet (5 mg total) by mouth daily., Disp: 90 tablet, Rfl: 3 .  sitaGLIPtin (JANUVIA) 100 MG tablet, Take 1 tablet (100 mg total) by mouth daily., Disp: 90 tablet, Rfl: 1 .  THEO-24 200 MG 24 hr capsule, TAKE 1 CAPSULE BY MOUTH ONCE DAILY, Disp: 90 capsule, Rfl: 2   Allergies:  Patient has no known allergies.   Review of Systems:  Constitutional: Feels well. Cardiovascular: Denies chest pain, exertional chest pain.  Pulmonary: Denies hemoptysis, pleuritic chest pain.   The remainder of systems were reviewed and were found to be negative other than what is documented in the HPI.   Physical Examination:   VS: BP 114/72 (BP Location: Right Arm, Cuff Size: Normal)   Pulse 81   Ht 4' 10" (1.473 m)   Wt 163 lb 3.2 oz (74 kg)   SpO2 97%   BMI 34.11 kg/m   General Appearance: Appears dyspneic. Neuro:without focal findings, mental status, speech normal, alert and oriented HEENT: PERRLA, EOM intact Pulmonary: Bilateral expiratory wheezing, diffuse throughout both lungs. CardiovascularNormal S1,S2.  No m/r/g.  Abdomen: Benign, Soft, non-tender, No masses Renal:  No costovertebral tenderness  GU:  No performed at this time. Endoc: No evident thyromegaly, no signs of acromegaly or Cushing features Skin:   warm, no rashes, no ecchymosis  Extremities: normal, no cyanosis, clubbing.    LABORATORY PANEL:   CBC No results for input(s): WBC, HGB, HCT, PLT in the last 168 hours.  ------------------------------------------------------------------------------------------------------------------  Chemistries  No results for input(s): NA, K, CL, CO2, GLUCOSE, BUN, CREATININE, CALCIUM, MG, AST, ALT, ALKPHOS, BILITOT in the last 168 hours.  Invalid input(s): GFRCGP ------------------------------------------------------------------------------------------------------------------  Cardiac Enzymes No results for input(s): TROPONINI in the last 168 hours. ------------------------------------------------------------  RADIOLOGY:   No results found for this or any previous visit. Results for orders placed during the hospital encounter of 11/05/17  DG Chest 2 View   Narrative CLINICAL DATA:  Worsening shortness of breath since last night. History of COPD with current symptoms not responsive to medication. Minimally productive cough.  EXAM: CHEST - 2 VIEW  COMPARISON:  Portable chest x-ray of September 25, 2017  FINDINGS: The lungs are adequately inflated. The interstitial markings are coarse. There is no alveolar infiltrate. There is no pleural effusion. The heart is top-normal in size. The pulmonary vascularity is prominent centrally on the right but stable. There is calcification in the wall of the aortic arch. The bony thorax is unremarkable.  IMPRESSION: Chronic bronchitic changes with possible superimposed acute bronchitis. No alveolar pneumonia nor CHF.  Thoracic aortic atherosclerosis.   Electronically Signed  By: David  Jordan M.D.   On: 11/05/2017 17:01    ------------------------------------------------------------------------------------------------------------------  Thank  you for allowing [facility] to assist in the care of your patient. Our recommendations are noted above.  Please contact us if we can be of further service.   Deep Ramachandran, M.D., F.C.C.P.  Board Certified in Internal Medicine, Pulmonary  Medicine, Critical Care Medicine, and Sleep Medicine.  [facility] Office Number: [phone]  10/10/2018  

## 2018-10-10 NOTE — ED Notes (Signed)
Attempted to use our language line resources to interpret for patient.  Patient unable to understand.  Patient's daughter called to bedside to provide interpretation.

## 2018-10-10 NOTE — ED Triage Notes (Signed)
Patient reports  Cough for 3 days now . Has a History of COPD and Diabetes.

## 2018-10-10 NOTE — ED Provider Notes (Signed)
[facility] Emergency Department Provider Note  Time seen: 10:44 AM  I have reviewed the triage vital signs and the nursing notes.   HISTORY  Chief Complaint Dyspnea, cough   HPI Whitney Fleming is a 73 y.o. female with a past medical history of COPD, hypertension, diabetes, presents to the emergency department for shortness of breath.  Patient has chronic COPD, followed by pulmonology.  Patient typically takes azithromycin 3 times a week, last week they increased it to every day.  Patient has been using her nebulizer treatments at home but continues to be short of breath with a cough so they brought her to the emergency department for evaluation and possible steroids per daughter.  Patient speaks Gujarati, we attempted to use our interpreter without success, daughter is interpreting for [place].  Denies any fever.  Denies any sick contacts.  Denies any travel.  Overall the patient appears well, calm cooperative, no distress lying in bed with reassuring vitals including a 99% room air saturation and afebrile.   Past Medical History:  Diagnosis Date  . Anemia    normocytic anemia  . Asthma   . Cardiomegaly   . Diabetes mellitus without complication ([place])   . Hypertension   . Hyponatremia     Patient Active Problem List   Diagnosis Date Noted  . COPD (chronic obstructive pulmonary disease) ([place]) 03/03/2018  . Hyperlipidemia associated with type 2 diabetes mellitus ([place]) 12/20/2017  . GERD (gastroesophageal reflux disease) 12/20/2017  . Asthma 09/26/2017  . HTN (hypertension) 09/26/2017  . Diabetes ([place]) 09/26/2017    Past Surgical History:  Procedure Laterality Date  . TUBAL LIGATION      Prior to Admission medications   Medication Sig Start Date End Date Taking? Authorizing Provider  ACCU-CHEK FASTCLIX LANCETS MISC Use as directed to check blood glucose twice daily 04/18/18   Bacigalupo, Dionne Bucy, MD  AMBULATORY NON FORMULARY MEDICATION Medication Name: Aero  chamber use as directed. ZL:D35.701 12/24/17   [name], MD  azithromycin (ZITHROMAX) 250 MG tablet Take 1 tablet (250 mg total) by mouth daily. Take 1 tablet (250 mg) by mouth 5 days a week. 10/03/18   [name], MD  Blood Glucose Monitoring Suppl (ACCU-CHEK NANO SMARTVIEW) w/Device KIT Use as directed to check blood glucose twice daily 05/01/18   [name], MD  budesonide (PULMICORT) 0.5 MG/2ML nebulizer solution Take 2 mLs (0.5 mg total) by nebulization 2 (two) times daily. 04/25/18   [name], MD  budesonide-formoterol (SYMBICORT) 160-4.5 MCG/ACT inhaler Inhale 2 puffs into the lungs 2 (two) times daily. Patient not taking: Reported on 10/10/2018 12/09/17   [name], MD  carvedilol (COREG) 12.5 MG tablet TAKE 1 TABLET BY MOUTH TWICE DAILY WITH A MEAL 09/22/18   Bacigalupo, Dionne Bucy, MD  empagliflozin (JARDIANCE) 25 MG TABS tablet Take 25 mg by mouth daily. 09/22/18   [name], MD  glucose blood (ACCU-CHEK SMARTVIEW) test strip Use as directed to check blood glucose twice daily 04/18/18   [name], MD  Insulin Pen Needle (NOVOFINE) 32G X 6 MM MISC Use as directed to inject insulin daily Patient not taking: Reported on 10/10/2018 02/13/18   [name], MD  levalbuterol [name]) 1.25 MG/3ML nebulizer solution Take 1.25 mg by nebulization every 6 (six) hours as needed for wheezing. DX:J44.9 02/12/18 02/12/19  Bettey Costa, MD  losartan (COZAAR) 50 MG tablet Take 1 tablet (50 mg total) by mouth daily. 09/22/18   [name], MD  metFORMIN (GLUCOPHAGE) 1000 MG tablet Take 1 tablet (1,000 mg total) by mouth 2 (two) times daily with a meal. 09/22/18   Bacigalupo, Dionne Bucy, MD  montelukast (SINGULAIR) 10 MG tablet Take 1 tablet (10 mg total) by mouth at bedtime. 09/22/18   Bacigalupo, Dionne Bucy, MD  PRESCRIPTION MEDICATION **Non-US market drug** Zyrova C (rosuvastatin/clopidigrel) Take 1 capsule by mouth every evening     [provider]  rosuvastatin (CRESTOR) 5 MG tablet Take 1 tablet (5 mg total) by mouth daily. 09/22/18   Bacigalupo, Dionne Bucy, MD  sitaGLIPtin (JANUVIA) 100 MG tablet Take 1 tablet (100 mg total) by mouth daily. 09/22/18   [name], MD  THEO-24 200 MG 24 hr capsule TAKE 1 CAPSULE BY MOUTH ONCE DAILY 05/05/18   [name], MD    No Known Allergies  Family History  Problem Relation Age of Onset  . Tuberculosis Other   . Healthy Mother   . Heart disease Father   . Asthma Father   . Cancer Neg Hx     Social History Social History   Tobacco Use  . Smoking status: Never Smoker  . Smokeless tobacco: Never Used  Substance Use Topics  . Alcohol use: Never    Frequency: Never  . Drug use: Never    Review of Systems Constitutional: Negative for fever. Cardiovascular: Negative for chest pain. Respiratory: Negative for shortness of breath. Gastrointestinal: Negative for abdominal pain, vomiting Musculoskeletal: Negative for musculoskeletal complaints Skin: Negative for skin complaints  Neurological: Negative for headache All other ROS negative  ____________________________________________   PHYSICAL EXAM:  VITAL SIGNS: ED Triage Vitals [10/10/18 1009]  Enc Vitals Group     BP (!) 116/46     Pulse Rate 81     Resp 16     Temp 98.1 F (36.7 C)     Temp Source Oral     SpO2 99 %     Weight      Height      Head Circumference      Peak Flow      Pain Score      Pain Loc      Pain Edu?      Excl. in [place]?    Constitutional: Alert and oriented. Well appearing and in no distress. Eyes: Normal exam ENT      Head: Normocephalic and atraumatic.      Mouth/Throat: Mucous membranes are moist. Cardiovascular: Normal rate, regular rhythm.  Respiratory: Normal respiratory effort without tachypnea nor retractions.  Mild expiratory wheeze bilaterally.  No rales or rhonchi. Gastrointestinal: Soft and nontender. No distention.  Musculoskeletal:  Nontender with normal range of motion in all extremities. Neurologic:  Normal speech and language. No gross focal neurologic deficits  Skin:  Skin is warm, dry and intact.  Psychiatric: Mood and affect are normal.   ____________________________________________    EKG  EKG viewed and interpreted by myself shows a normal sinus rhythm at 74 bpm with a narrow QRS, normal axis, normal intervals, no ST changes.  Normal EKG.  ____________________________________________    RADIOLOGY  Chest x-ray is negative for acute abnormality.  ____________________________________________   INITIAL IMPRESSION / ASSESSMENT AND PLAN / ED COURSE  Pertinent labs & imaging results that were available during my care of the patient were reviewed by me and considered in my medical decision making (see chart for details).   Patient presents emergency department for shortness of breath and wheeze.  Patient has a history of COPD followed  by pulmonology.  Patient is taking azithromycin as well as using her nebulizer treatments.  Overall the patient appears well, reassuring vitals, afebrile, no travel or sick contacts.  We will check labs, EKG and a chest x-ray.  Patient has mild expiratory wheeze bilaterally on exam we will treat with Solu-Medrol while awaiting results.  Patient and daughter agreeable to plan of care.  Differential at this time would include pneumonia, upper respiratory infection, COPD exacerbation less likely ACS.  Patient's work-up is largely reassuring, slight renal insufficiency.  Troponin is negative.  I instructed the patient to drink plenty of fluids.  We will place the patient on a prednisone taper.  Patient will continue to use her breathing treatments at home and call her pulmonologist on Monday.  I discussed return precautions.  Daughter agreeable to plan.  Aina Rossbach was evaluated in Emergency Department on 10/10/2018 for the symptoms described in the history of present illness. She was  evaluated in the context of the global COVID-19 pandemic, which necessitated consideration that the patient might be at risk for infection with the SARS-CoV-2 virus that causes COVID-19. Institutional protocols and algorithms that pertain to the evaluation of patients at risk for COVID-19 are in a state of rapid change based on information released by regulatory bodies including the CDC and federal and state organizations. These policies and algorithms were followed during the patient's care in the ED.  ____________________________________________   FINAL CLINICAL IMPRESSION(S) / ED DIAGNOSES  COPD exacerbation   Harvest Dark, MD 10/10/18 1139

## 2018-10-10 NOTE — Patient Instructions (Addendum)
You are having a flareup of your asthma, this needs to be treated in the emergency room. You may be treated with nebulizers and steroids, and may be admitted to the hospital. They may need to test you for COVID-19.

## 2018-10-13 ENCOUNTER — Telehealth: Payer: Self-pay | Admitting: Family Medicine

## 2018-10-13 NOTE — Telephone Encounter (Signed)
Pt's daughter called for a refill on her moms  Insulin pens.  She said her mom was in the ER last week and started on Prednisone?  [place]  She also ask for a 90 day supply on all her moms prescriptions.  Daughter's CB#  [phone]

## 2018-10-14 ENCOUNTER — Telehealth: Payer: Self-pay

## 2018-10-14 DIAGNOSIS — E1169 Type 2 diabetes mellitus with other specified complication: Secondary | ICD-10-CM

## 2018-10-14 MED ORDER — GLIPIZIDE 5 MG PO TABS: 5.0000 mg | ORAL_TABLET | Freq: Two times a day (BID) | ORAL | 0 refills | Status: DC

## 2018-10-14 NOTE — Telephone Encounter (Signed)
We do not have Lantus on her medication list and per Dr. Sharmaine Base last note it states she would do best on glipizide 5mg  BID while on steroids, thus I will send this in.   I will send note to Dr. B for review and make sure she is ok with that.

## 2018-10-14 NOTE — Telephone Encounter (Signed)
Patient's daughter Malachi Paradise called stating that her moms Insulin pen needs to be refilled asap.She was in the ER on Friday and placed on Prednisone and her sugars have been elevated Patients daughter says she has called multiple times about this needing to be refilled and she hasn't heard back yet. Lantus solostar 15 units. This was all the information she had for me.

## 2018-10-14 NOTE — Telephone Encounter (Signed)
Left message advising patient's daughter that Glipizide has been sent to pharmacy.

## 2018-10-15 MED ORDER — INSULIN GLARGINE 100 UNIT/ML SOLOSTAR PEN: 15.0000 [IU] | PEN_INJECTOR | Freq: Every day | SUBCUTANEOUS | 0 refills | Status: DC

## 2018-10-15 NOTE — Telephone Encounter (Signed)
Patient's daughter advised.  

## 2018-10-15 NOTE — Telephone Encounter (Signed)
Patient's daughter called back today stating that her mother's BG is 400-600 despite glipizide.  She would like to resume insulin while on steroids.  Will Rx Lantus 15 units daily to take while on steroids.  Please do not take with glipizide as this increases risk of hypoglycemia.  Ok to start today. Can go up to 20 units if blood sugar remains elevated with steroids.

## 2018-10-22 ENCOUNTER — Other Ambulatory Visit: Payer: Self-pay

## 2018-10-22 MED ORDER — INSULIN GLARGINE 100 UNIT/ML SOLOSTAR PEN: 15.0000 [IU] | PEN_INJECTOR | Freq: Every day | SUBCUTANEOUS | 0 refills | Status: DC

## 2018-11-05 ENCOUNTER — Ambulatory Visit: Payer: Self-pay | Admitting: Family Medicine

## 2018-11-05 ENCOUNTER — Ambulatory Visit (INDEPENDENT_AMBULATORY_CARE_PROVIDER_SITE_OTHER): Payer: Medicaid Other | Admitting: Family Medicine

## 2018-11-05 VITALS — BP 124/73 | HR 91

## 2018-11-05 DIAGNOSIS — J411 Mucopurulent chronic bronchitis: Secondary | ICD-10-CM

## 2018-11-05 DIAGNOSIS — E1165 Type 2 diabetes mellitus with hyperglycemia: Secondary | ICD-10-CM

## 2018-11-05 DIAGNOSIS — E785 Hyperlipidemia, unspecified: Secondary | ICD-10-CM | POA: Diagnosis not present

## 2018-11-05 DIAGNOSIS — J4551 Severe persistent asthma with (acute) exacerbation: Secondary | ICD-10-CM | POA: Diagnosis not present

## 2018-11-05 DIAGNOSIS — I10 Essential (primary) hypertension: Secondary | ICD-10-CM

## 2018-11-05 DIAGNOSIS — E1169 Type 2 diabetes mellitus with other specified complication: Secondary | ICD-10-CM | POA: Diagnosis not present

## 2018-11-05 MED ORDER — MONTELUKAST SODIUM 10 MG PO TABS: 10.0000 mg | ORAL_TABLET | Freq: Every day | ORAL | 1 refills | Status: DC

## 2018-11-05 MED ORDER — INSULIN GLARGINE 100 UNIT/ML SOLOSTAR PEN: 15.0000 [IU] | PEN_INJECTOR | Freq: Every day | SUBCUTANEOUS | 1 refills | Status: DC

## 2018-11-05 MED ORDER — LOSARTAN POTASSIUM 50 MG PO TABS: 50.0000 mg | ORAL_TABLET | Freq: Every day | ORAL | 3 refills | Status: DC

## 2018-11-05 MED ORDER — LEVALBUTEROL HCL 1.25 MG/3ML IN NEBU: 1.2500 mg | INHALATION_SOLUTION | Freq: Four times a day (QID) | RESPIRATORY_TRACT | 5 refills | Status: DC | PRN

## 2018-11-05 MED ORDER — EMPAGLIFLOZIN 25 MG PO TABS: 25.0000 mg | ORAL_TABLET | Freq: Every day | ORAL | 1 refills | Status: DC

## 2018-11-05 MED ORDER — METFORMIN HCL 1000 MG PO TABS: 1000.0000 mg | ORAL_TABLET | Freq: Two times a day (BID) | ORAL | 3 refills | Status: DC

## 2018-11-05 MED ORDER — ROSUVASTATIN CALCIUM 5 MG PO TABS: 5.0000 mg | ORAL_TABLET | Freq: Every day | ORAL | 3 refills | Status: DC

## 2018-11-05 MED ORDER — CARVEDILOL 12.5 MG PO TABS: ORAL_TABLET | ORAL | 1 refills | Status: DC

## 2018-11-05 MED ORDER — PREDNISONE 10 MG PO TABS: 10.0000 mg | ORAL_TABLET | Freq: Every day | ORAL | 0 refills | Status: DC

## 2018-11-05 MED ORDER — SITAGLIPTIN PHOSPHATE 100 MG PO TABS: 100.0000 mg | ORAL_TABLET | Freq: Every day | ORAL | 1 refills | Status: DC

## 2018-11-05 NOTE — Progress Notes (Signed)
Patient: Whitney Fleming Female    DOB: 1945/10/13   73 y.o.   MRN: [id number] Visit Date: 11/06/2018  Today's Provider: Lavon Paganini, MD   Chief Complaint  Patient presents with  . Diabetes   Subjective:    I, Whitney Fleming CMA, am acting as a scribe for Lavon Paganini, MD.  Virtual Visit via Video Note  I connected with Whitney Fleming on 11/06/18 at  3:20 PM EDT by a video enabled telemedicine application and verified that I am speaking with the correct person using two identifiers.   Patient location: home Provider location: [place] involved in the visit: patient, provider, patient's daughter    I discussed the limitations of evaluation and management by telemedicine and the availability of in person appointments. The patient expressed understanding and agreed to proceed.   HPI  Diabetes Mellitus Type II, Follow-up:   Lab Results  Component Value Date   HGBA1C 10.2 (A) 11/06/2018   HGBA1C 9.1 (A) 08/07/2018   HGBA1C 9.1 (A) 05/01/2018    Last seen for diabetes 3 months ago.  Management since then includes none. She reports good compliance with treatment. She is not having side effects.  Current symptoms include none and have been stable. Home blood sugar records: not checked it at home  Episodes of hypoglycemia? no   Current Insulin Regimen: yes Most Recent Eye Exam: Unsure Weight trend: stable Prior visit with dietician: No Current exercise: none Current diet habits: well balanced  Pertinent Labs:    Component Value Date/Time   CHOL 110 01/17/2018 0823   CHOL 91 12/25/2012 0154   TRIG 103 01/17/2018 0823   TRIG 143 12/25/2012 0154   HDL 37 (L) 01/17/2018 0823   HDL 26 (L) 12/25/2012 0154   LDLCALC 52 01/17/2018 0823   LDLCALC 36 12/25/2012 0154   CREATININE 1.30 (H) 10/10/2018 1106   CREATININE 1.16 12/26/2012 0545    Wt Readings from Last 3 Encounters:  10/10/18 166 lb (75.3 kg)  10/10/18 163 lb 3.2 oz  (74 kg)  08/07/18 165 lb 6.4 oz (75 kg)   Blood sugar spike significantly when she needs to take steroids for her asthma/COPD ------------------------------------------------------------------------  Follow up ER visit  Patient was seen in ER for cough and COPD on 10/10/2018. She was treated for asthma/COPD exacerbation. Treatment for this included labs, chest x-ray and EKG. She reports good compliance with treatment. She reports this condition is Unchanged per patient daughter. She states that since patient has finished the prednisone patient has started back coughing with shortness of breath.  She has managed by pulmonology for her asthma.  She is taking Pulmicort every 4 hours currently.  She does not have any albuterol nebulizers available per her daughter.  She reports that after taking the steroids prescribed by the emergency department she was doing better, but within the last few days she is gotten significantly short of breath again and seems to be wheezing significantly.  Her daughter is having to take her to the bathroom in a wheelchair as she is too short of breath to walk down the hall.  She denies any chest pain.  States this feels similar to previous asthma/COPD exacerbations.  She continues to take her azithromycin, Singulair, theophylline as prescribed ------------------------------------------------------------------------------------   No Known Allergies   Current Outpatient Medications:  .  ACCU-CHEK FASTCLIX LANCETS MISC, Use as directed to check blood glucose twice daily, Disp: 100 each, Rfl: 3 .  AMBULATORY NON FORMULARY  MEDICATION, Medication Name: Aero chamber use as directed. 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  azithromycin (ZITHROMAX) 250 MG tablet, Take 1 tablet (250 mg total) by mouth daily. Take 1 tablet (250 mg) by mouth 5 days a week., Disp: 60 each, Rfl: 0 .  Blood Glucose Monitoring Suppl (ACCU-CHEK NANO SMARTVIEW) w/Device KIT, Use as directed to check blood  glucose twice daily, Disp: 1 kit, Rfl: 0 .  budesonide (PULMICORT) 0.5 MG/2ML nebulizer solution, Take 2 mLs (0.5 mg total) by nebulization 2 (two) times daily., Disp: 75 mL, Rfl: 12 .  carvedilol (COREG) 12.5 MG tablet, TAKE 1 TABLET BY MOUTH TWICE DAILY WITH A MEAL, Disp: 180 tablet, Rfl: 1 .  empagliflozin (JARDIANCE) 25 MG TABS tablet, Take 25 mg by mouth daily., Disp: 90 tablet, Rfl: 1 .  glucose blood (ACCU-CHEK SMARTVIEW) test strip, Use as directed to check blood glucose twice daily, Disp: 100 each, Rfl: 3 .  Insulin Glargine (LANTUS SOLOSTAR) 100 UNIT/ML Solostar Pen, Inject 15 Units into the skin daily. Use while taking steroids only, Disp: 5 pen, Rfl: 1 .  levalbuterol (XOPENEX) 1.25 MG/3ML nebulizer solution, Take 1.25 mg by nebulization every 6 (six) hours as needed for wheezing. DX:J44.9, Disp: 216 mL, Rfl: 5 .  losartan (COZAAR) 50 MG tablet, Take 1 tablet (50 mg total) by mouth daily., Disp: 90 tablet, Rfl: 3 .  metFORMIN (GLUCOPHAGE) 1000 MG tablet, Take 1 tablet (1,000 mg total) by mouth 2 (two) times daily with a meal., Disp: 180 tablet, Rfl: 3 .  montelukast (SINGULAIR) 10 MG tablet, Take 1 tablet (10 mg total) by mouth at bedtime., Disp: 90 tablet, Rfl: 1 .  PRESCRIPTION MEDICATION, **Non-US market drug** Zyrova C (rosuvastatin/clopidigrel) Take 1 capsule by mouth every evening, Disp: , Rfl:  .  rosuvastatin (CRESTOR) 5 MG tablet, Take 1 tablet (5 mg total) by mouth daily., Disp: 90 tablet, Rfl: 3 .  sitaGLIPtin (JANUVIA) 100 MG tablet, Take 1 tablet (100 mg total) by mouth daily., Disp: 90 tablet, Rfl: 1 .  THEO-24 200 MG 24 hr capsule, TAKE 1 CAPSULE BY MOUTH ONCE DAILY, Disp: 90 capsule, Rfl: 2 .  budesonide-formoterol (SYMBICORT) 160-4.5 MCG/ACT inhaler, Inhale 2 puffs into the lungs 2 (two) times daily. (Patient not taking: Reported on 10/10/2018), Disp: 1 Inhaler, Rfl: 6 .  Insulin Pen Needle (NOVOFINE) 32G X 6 MM MISC, Use as directed to inject insulin daily (Patient not  taking: Reported on 10/10/2018), Disp: 100 each, Rfl: 3 .  predniSONE (DELTASONE) 10 MG tablet, Take 1 tablet (10 mg total) by mouth daily. Day 1-3: take 4 tablets PO daily Day 4-6: take 3 tablets PO daily Day 7-9: take 2 tablets PO daily Day 10-12: take 1 tablet PO daily, Disp: 30 tablet, Rfl: 0  Review of Systems  Constitutional: Negative.   Respiratory: Positive for cough, shortness of breath and wheezing.   Genitourinary: Negative.   Neurological: Negative.   Psychiatric/Behavioral: Negative.     Social History   Tobacco Use  . Smoking status: Never Smoker  . Smokeless tobacco: Never Used  Substance Use Topics  . Alcohol use: Never    Frequency: Never      Objective:   BP 124/73 (BP Location: Left Arm, Patient Position: Sitting, Cuff Size: Large)   Pulse 91  Vitals:   11/06/18 1529  BP: 124/73  Pulse: 91     Physical Exam Constitutional:      Appearance: Normal appearance.  Pulmonary:     Comments: Tachypneic with  audible wheezing but no respiratory distress at rest Neurological:     Mental Status: She is alert. Mental status is at baseline.  Psychiatric:        Mood and Affect: Mood normal.        Behavior: Behavior normal.     Results for orders placed or performed in visit on 11/05/18  POCT HgB A1C  Result Value Ref Range   Hemoglobin A1C 10.2 (A) 4.0 - 5.6 %   HbA1c POC (<> result, manual entry)     HbA1c, POC (prediabetic range)     HbA1c, POC (controlled diabetic range)         Assessment & Plan  Follow Up Instructions: I discussed the assessment and treatment plan with the patient. The patient was provided an opportunity to ask questions and all were answered. The patient agreed with the plan and demonstrated an understanding of the instructions.   The patient was advised to call back or seek an in-person evaluation if the symptoms worsen or if the condition fails to improve as anticipated.  Problem List Items Addressed This Visit       Cardiovascular and Mediastinum   HTN (hypertension)    Controlled on home readings per daughter Higher goal given advanced age Continue current medications at current doses Reviewed recent metabolic panel      Relevant Medications   carvedilol (COREG) 12.5 MG tablet   losartan (COZAAR) 50 MG tablet   rosuvastatin (CRESTOR) 5 MG tablet     Respiratory   Asthma    Patient with chronic dyspnea on exertion that is worsened currently She seems to be having an exacerbation currently Discussed decreasing the Pulmicort back to twice daily as prescribed Can use Xopenex as a rescue inhaler up to every 4 hours She is followed by pulmonology She can continue Singulair, theophylline, azithromycin as prescribed Given the persistence of her exacerbations and how uncontrolled she is, it is possible that she needs chronic steroid therapy Would also recommend oxygen testing to see if she needs home O2 at her next in-person visit with [place] or pulmonology We will give her a burst of prednisone currently Vies need to follow-up with pulmonology      Relevant Medications   predniSONE (DELTASONE) 10 MG tablet   levalbuterol (XOPENEX) 1.25 MG/3ML nebulizer solution   montelukast (SINGULAIR) 10 MG tablet   COPD (chronic obstructive pulmonary disease) ([place])    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 Multiple recent exacerbations, including one currently Followed by pulmonology See plan above as per asthma      Relevant Medications   predniSONE (DELTASONE) 10 MG tablet   levalbuterol (XOPENEX) 1.25 MG/3ML nebulizer solution   montelukast (SINGULAIR) 10 MG tablet     Endocrine   Diabetes ([place]) - Primary    Can uncontrolled A1c continues to worsen Multiple courses of prednisone seem to worsen this Patient did not tolerate Bydureon and is not agreeable to any GLP-1 therapy because she thinks the shots hurt too much We will continue Januvia, Jardiance, metformin at current doses Add daily  Lantus 15 units in the morning If fasting blood glucoses are consistently above 150, increase to Lantus 18 units Follow-up in 1 month on diabetes management She is on a statin and losartan      Relevant Medications   Insulin Glargine (LANTUS SOLOSTAR) 100 UNIT/ML Solostar Pen   empagliflozin (JARDIANCE) 25 MG TABS tablet   losartan (COZAAR) 50 MG tablet   metFORMIN (GLUCOPHAGE) 1000 MG tablet  sitaGLIPtin (JANUVIA) 100 MG tablet   rosuvastatin (CRESTOR) 5 MG tablet   Other Relevant Orders   POCT HgB A1C (Completed)   Hyperlipidemia associated with type 2 diabetes mellitus ([facility])    Previously well controlled Continue Crestor at current dose      Relevant Medications   Insulin Glargine (LANTUS SOLOSTAR) 100 UNIT/ML Solostar Pen   empagliflozin (JARDIANCE) 25 MG TABS tablet   losartan (COZAAR) 50 MG tablet   metFORMIN (GLUCOPHAGE) 1000 MG tablet   sitaGLIPtin (JANUVIA) 100 MG tablet   rosuvastatin (CRESTOR) 5 MG tablet       Return in about 4 weeks (around 12/03/2018) for Diabetes follow-up.   The entirety of the information documented in the History of Present Illness, Review of Systems and Physical Exam were personally obtained by me. Portions of this information were initially documented by [facility], CMA and reviewed by me for thoroughness and accuracy.    , Dionne Bucy, MD MPH [facility]

## 2018-11-06 LAB — POCT GLYCOSYLATED HEMOGLOBIN (HGB A1C): Hemoglobin A1C: 10.2 % — AB (ref 4.0–5.6)

## 2018-11-06 NOTE — Assessment & Plan Note (Signed)
Can uncontrolled A1c continues to worsen Multiple courses of prednisone seem to worsen this Patient did not tolerate Bydureon and is not agreeable to any GLP-1 therapy because she thinks the shots hurt too much We will continue Januvia, Jardiance, metformin at current doses Add daily Lantus 15 units in the morning If fasting blood glucoses are consistently above 150, increase to Lantus 18 units Follow-up in 1 month on diabetes management She is on a statin and losartan

## 2018-11-06 NOTE — Assessment & Plan Note (Signed)
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

## 2018-11-06 NOTE — Assessment & Plan Note (Signed)
Patient with chronic dyspnea on exertion that is worsened currently She seems to be having an exacerbation currently Discussed decreasing the Pulmicort back to twice daily as prescribed Can use Xopenex as a rescue inhaler up to every 4 hours She is followed by pulmonology She can continue Singulair, theophylline, azithromycin as prescribed Given the persistence of her exacerbations and how uncontrolled she is, it is possible that she needs chronic steroid therapy Would also recommend oxygen testing to see if she needs home O2 at her next in-person visit with [place] or pulmonology We will give her a burst of prednisone currently Vies need to follow-up with pulmonology

## 2018-11-06 NOTE — Assessment & Plan Note (Signed)
Controlled on home readings per daughter Higher goal given advanced age Continue current medications at current doses Reviewed recent metabolic panel

## 2018-11-06 NOTE — Assessment & Plan Note (Signed)
Previously well controlled Continue Crestor at current dose

## 2018-11-07 ENCOUNTER — Ambulatory Visit (INDEPENDENT_AMBULATORY_CARE_PROVIDER_SITE_OTHER): Payer: Medicaid Other | Admitting: Internal Medicine

## 2018-11-07 ENCOUNTER — Telehealth: Payer: Self-pay

## 2018-11-07 ENCOUNTER — Other Ambulatory Visit: Payer: Self-pay

## 2018-11-07 DIAGNOSIS — J4550 Severe persistent asthma, uncomplicated: Secondary | ICD-10-CM

## 2018-11-07 MED ORDER — BUDESONIDE-FORMOTEROL FUMARATE 160-4.5 MCG/ACT IN AERO: 2.0000 | INHALATION_SPRAY | Freq: Two times a day (BID) | RESPIRATORY_TRACT | 12 refills | Status: DC

## 2018-11-07 NOTE — Telephone Encounter (Signed)
LMTCB

## 2018-11-07 NOTE — Telephone Encounter (Signed)
LM for Manisha to call to schedule 6 wk f/u apt.

## 2018-11-07 NOTE — Telephone Encounter (Signed)
-----   Message from [name], MD sent at 11/06/2018  3:35 PM EDT ----- A1c is quite elevated.  It is now up to 10.2.  Needs to continue all of her oral medications for diabetes and take the Lantus 15 units daily even when not on steroids.  If fasting morning blood sugars are consistently elevated above 150, can increase Lantus dose to 18 units daily.  Let us plan a diabetes follow-up in 1 month so we can adjust insulin further if needed

## 2018-11-07 NOTE — Progress Notes (Signed)
* Whitney Fleming   Virtual Visit via video visit note I connected with patient on 11/07/18 at  8:45 AM EDT by telephone and verified that I am speaking with the correct person using two identifiers.   I discussed the limitations, risks, security and privacy concerns of performing an evaluation and management service by video visit and the availability of in person appointments. I also discussed with the patient that there may be a patient responsible charge related to this service. The patient expressed understanding and agreed to proceed. I discussed the assessment and treatment plan with the patient. The patient was provided an opportunity to ask questions and all were answered. The patient agreed with the plan and demonstrated an understanding of the instructions. Please see note below for further detail.    The patient was advised to call back or seek an in-person evaluation if the symptoms worsen or if the condition fails to improve as anticipated.    Whitney Hobby, MD     Assessment and Plan: Acute severe asthma with acute bronchitis and acute asthma exacerbation. Asthma/chronic bronchitis with multiple exacerbations. Noncompliance with medications. -Severe persistent asthma, daily symptoms at high risk of complications.   -I again reiterated the importance of staying on a maintenance medication, Symbicort 2 puffs twice daily.  I reiterated that prednisone is a rescue medication and should be used only after other usual medications have been tried and failed.  I explained the multiple side effects of prednisone.   Chronic bronchitis/COPD with multiple exacerbations and numerous hospital admissions. - Continue azithromycin 250 mg 5 days/week.  Dyspnea on exertion. - Secondary to above.  Continue above medications.  Greater than 50% of the 25 minute visit was spent in counseling/coordination of care regarding asthma medications  Meds ordered this  encounter  Medications  . budesonide-formoterol (SYMBICORT) 160-4.5 MCG/ACT inhaler    Sig: Inhale 2 puffs into the lungs 2 (two) times daily. Rinse mouth after use    Dispense:  1 Inhaler    Refill:  12    Return in about 6 weeks (around 12/19/2018).    Date: 11/07/2018  MRN# [id number] Whitney Fleming March 26, 1946   Whitney Fleming is a 73 y.o. old female seen in follow up for chief complaint of dyspnea.   HPI:  The patient is a 73 year old female with severe allergic asthma.  She speaks good uropathy, her daughter translates.  She has had 3 admissions to the hospital in 2019 with asthma exacerbations including most recently in September 2019.  At last visit she was reminded to use Symbicort inhaler 2 puffs twice daily, in addition to her Xopenex nebulizer.  We previously considered her for Nucala but declined anything that involve injections.  She was therefore maintained on azithromycin 5 times weekly. Patient is noted to have her breathing getting worse, and they were worried that she had to go to ED, she went to see her physician the next day and received prednisone which helped.   She is not taking symbicort because it ran out and she is only using albuterol inhaler. She has continued on azithromycin 5 days per week but does not feel that it is helping.  She recently got a course of prednisone, and feels that it helps, she would like to stay on the prednisone.    **IgE 12/24/17; IGe 5.  **CXR 11/05/17; changes of chronic bronchitis, right heart enlargement.  **CBC 02/11/18>> absolute eosinophil count 800. **CBC 09/25/17; Abs eos count 300.    Medication:  Current Outpatient Medications:  .  ACCU-CHEK FASTCLIX LANCETS MISC, Use as directed to check blood glucose twice daily, Disp: 100 each, Rfl: 3 .  AMBULATORY NON FORMULARY MEDICATION, Medication Name: Aero chamber use as directed. 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  azithromycin (ZITHROMAX) 250 MG tablet, Take 1 tablet (250 mg total) by  mouth daily. Take 1 tablet (250 mg) by mouth 5 days a week., Disp: 60 each, Rfl: 0 .  Blood Glucose Monitoring Suppl (ACCU-CHEK NANO SMARTVIEW) w/Device KIT, Use as directed to check blood glucose twice daily, Disp: 1 kit, Rfl: 0 .  budesonide (PULMICORT) 0.5 MG/2ML nebulizer solution, Take 2 mLs (0.5 mg total) by nebulization 2 (two) times daily., Disp: 75 mL, Rfl: 12 .  budesonide-formoterol (SYMBICORT) 160-4.5 MCG/ACT inhaler, Inhale 2 puffs into the lungs 2 (two) times daily. (Patient not taking: Reported on 10/10/2018), Disp: 1 Inhaler, Rfl: 6 .  carvedilol (COREG) 12.5 MG tablet, TAKE 1 TABLET BY MOUTH TWICE DAILY WITH A MEAL, Disp: 180 tablet, Rfl: 1 .  empagliflozin (JARDIANCE) 25 MG TABS tablet, Take 25 mg by mouth daily., Disp: 90 tablet, Rfl: 1 .  glucose blood (ACCU-CHEK SMARTVIEW) test strip, Use as directed to check blood glucose twice daily, Disp: 100 each, Rfl: 3 .  Insulin Glargine (LANTUS SOLOSTAR) 100 UNIT/ML Solostar Pen, Inject 15 Units into the skin daily. Use while taking steroids only, Disp: 5 pen, Rfl: 1 .  Insulin Pen Needle (NOVOFINE) 32G X 6 MM MISC, Use as directed to inject insulin daily (Patient not taking: Reported on 10/10/2018), Disp: 100 each, Rfl: 3 .  levalbuterol (XOPENEX) 1.25 MG/3ML nebulizer solution, Take 1.25 mg by nebulization every 6 (six) hours as needed for wheezing. DX:J44.9, Disp: 216 mL, Rfl: 5 .  losartan (COZAAR) 50 MG tablet, Take 1 tablet (50 mg total) by mouth daily., Disp: 90 tablet, Rfl: 3 .  metFORMIN (GLUCOPHAGE) 1000 MG tablet, Take 1 tablet (1,000 mg total) by mouth 2 (two) times daily with a meal., Disp: 180 tablet, Rfl: 3 .  montelukast (SINGULAIR) 10 MG tablet, Take 1 tablet (10 mg total) by mouth at bedtime., Disp: 90 tablet, Rfl: 1 .  predniSONE (DELTASONE) 10 MG tablet, Take 1 tablet (10 mg total) by mouth daily. Day 1-3: take 4 tablets PO daily Day 4-6: take 3 tablets PO daily Day 7-9: take 2 tablets PO daily Day 10-12: take 1 tablet PO  daily, Disp: 30 tablet, Rfl: 0 .  PRESCRIPTION MEDICATION, **Non-US market drug** Zyrova C (rosuvastatin/clopidigrel) Take 1 capsule by mouth every evening, Disp: , Rfl:  .  rosuvastatin (CRESTOR) 5 MG tablet, Take 1 tablet (5 mg total) by mouth daily., Disp: 90 tablet, Rfl: 3 .  sitaGLIPtin (JANUVIA) 100 MG tablet, Take 1 tablet (100 mg total) by mouth daily., Disp: 90 tablet, Rfl: 1 .  THEO-24 200 MG 24 hr capsule, TAKE 1 CAPSULE BY MOUTH ONCE DAILY, Disp: 90 capsule, Rfl: 2   Allergies:  Patient has no known allergies.   Review of Systems:  Constitutional: Feels well. Cardiovascular: Denies chest pain, exertional chest pain.  Pulmonary: Denies hemoptysis, pleuritic chest pain.   The remainder of systems were reviewed and were found to be negative other than what is documented in the HPI.      Physical Examination:   --  LABORATORY PANEL:   CBC No results for input(s): WBC, HGB, HCT, PLT in the last 168 hours. ------------------------------------------------------------------------------------------------------------------  Chemistries  No results for input(s): NA, K, CL, CO2, GLUCOSE, BUN,  CREATININE, CALCIUM, MG, AST, ALT, ALKPHOS, BILITOT in the last 168 hours.  Invalid input(s): GFRCGP ------------------------------------------------------------------------------------------------------------------  Cardiac Enzymes No results for input(s): TROPONINI in the last 168 hours. ------------------------------------------------------------  RADIOLOGY:   No results found for this or any previous visit. Results for orders placed during the hospital encounter of 11/05/17  DG Chest 2 View   Narrative CLINICAL DATA:  Worsening shortness of breath since last night. History of COPD with current symptoms not responsive to medication. Minimally productive cough.  EXAM: CHEST - 2 VIEW  COMPARISON:  Portable chest x-ray of September 25, 2017  FINDINGS: The lungs are adequately  inflated. The interstitial markings are coarse. There is no alveolar infiltrate. There is no pleural effusion. The heart is top-normal in size. The pulmonary vascularity is prominent centrally on the right but stable. There is calcification in the wall of the aortic arch. The bony thorax is unremarkable.  IMPRESSION: Chronic bronchitic changes with possible superimposed acute bronchitis. No alveolar pneumonia nor CHF.  Thoracic aortic atherosclerosis.   Electronically Signed   By: David  Martinique M.D.   On: 11/05/2017 17:01    ------------------------------------------------------------------------------------------------------------------  Thank  you for allowing [facility] to assist in the care of your patient. Our recommendations are noted above.  Please contact us if we can be of further service.   Marda Stalker, M.D., F.C.C.P.  Board Certified in Internal [place], Pulmonary [place], [place], and Sleep [place].  [facility] Office Number: [phone]  11/07/2018

## 2018-11-07 NOTE — Telephone Encounter (Signed)
Apt scheduled. Nothing further at this time.

## 2018-11-07 NOTE — Patient Instructions (Addendum)
-  You must use Symbicort 2 puffs twice daily, do not stop this medication without telling the doctor.  Rinse mouth after use. - Continue Singulair (montelukast) daily. - Use nebulizer of albuterol or levalbuterol (Xopenex) as needed. - Prednisone should only be used when other medications failed due to multiple negative side effects.

## 2018-11-10 ENCOUNTER — Other Ambulatory Visit: Payer: Self-pay | Admitting: *Deleted

## 2018-11-10 MED ORDER — PREDNISONE 10 MG PO TABS: ORAL_TABLET | ORAL | 0 refills | Status: DC

## 2018-11-10 NOTE — Telephone Encounter (Signed)
Can you please call the pharmacy and make sure that they did not receive the prescriptions.  It seems on our end that it did go through on 11/05/2018.  I sent in a prednisone prescription that day as well as 90-day supplies of all of her medications to [facility] on [address].  If they have not received them, is okay to resend the prescriptions from 11/05/2018.  I just do not want them to get duplicate prednisone prescriptions

## 2018-11-10 NOTE — Telephone Encounter (Signed)
RX sent. Please let daughter know.

## 2018-11-10 NOTE — Telephone Encounter (Signed)
I spoke to the pharmacist at [facility] [address]. She states that she has all the other prescriptions except the Prednisone. She asked if you would please send it again.

## 2018-11-10 NOTE — Telephone Encounter (Signed)
Called patient's daughter regarding medication. No answer, left voicemail.

## 2018-11-10 NOTE — Telephone Encounter (Signed)
Patient's daughter called concerning the prednisone rx from 11/05/2018. The pharmacy did not receive the rx. They're also requesting 90 day supply on all her medications. [place] Please advise?

## 2018-11-10 NOTE — Telephone Encounter (Signed)
Patient's daughter advised. Follow up scheduled.

## 2018-11-19 ENCOUNTER — Other Ambulatory Visit: Payer: Self-pay | Admitting: Family Medicine

## 2018-11-21 ENCOUNTER — Other Ambulatory Visit: Payer: Self-pay | Admitting: *Deleted

## 2018-11-21 MED ORDER — CARVEDILOL 12.5 MG PO TABS: 12.5000 mg | ORAL_TABLET | Freq: Two times a day (BID) | ORAL | 0 refills | Status: DC

## 2018-11-21 NOTE — Telephone Encounter (Signed)
Resent rx for carvedilol. Pt's daughter called stating pharmacy did not receive the one sent 11/19/2018.

## 2018-11-28 ENCOUNTER — Other Ambulatory Visit: Payer: Medicaid Other

## 2018-11-28 ENCOUNTER — Ambulatory Visit: Payer: Medicaid Other | Admitting: Oncology

## 2018-12-14 ENCOUNTER — Other Ambulatory Visit: Payer: Self-pay | Admitting: Internal Medicine

## 2018-12-16 NOTE — Progress Notes (Deleted)
* [facility]   Virtual Visit via video visit note I connected with patient on 12/16/18 at  3:00 PM EDT by telephone and verified that I am speaking with the correct person using two identifiers.   I discussed the limitations, risks, security and privacy concerns of performing an evaluation and management service by video visit and the availability of in person appointments. I also discussed with the patient that there may be a patient responsible charge related to this service. The patient expressed understanding and agreed to proceed. I discussed the assessment and treatment plan with the patient. The patient was provided an opportunity to ask questions and all were answered. The patient agreed with the plan and demonstrated an understanding of the instructions. Please see note below for further detail.    The patient was advised to call back or seek an in-person evaluation if the symptoms worsen or if the condition fails to improve as anticipated.    Whitney Hobby, MD     Assessment and Plan: Acute severe asthma with multiple exacerbations. Asthma/chronic bronchitis with multiple exacerbations. Noncompliance with medications. -Severe persistent asthma, daily symptoms at high risk of complications.   -I again reiterated the importance of staying on a maintenance medication, Symbicort 2 puffs twice daily.  I reiterated that prednisone is a rescue medication and should be used only after other usual medications have been tried and failed.  I explained the multiple side effects of prednisone.   Chronic bronchitis/COPD with multiple exacerbations and numerous hospital admissions. - Continue azithromycin 250 mg 5 days/week.  Dyspnea on exertion. - Secondary to above.  Continue above medications.  Greater than 50% of the 25 minute visit was spent in counseling/coordination of care regarding asthma medications  No orders of the defined types were placed in  this encounter.   No follow-ups on file.    Date: 12/16/2018  MRN# [id number] Whitney Fleming 1945/08/14   Whitney Fleming is a 73 y.o. old female seen in follow up for chief complaint of dyspnea.   HPI:  Whitney Fleming is a 73 y.o. female  with severe allergic asthma.  She speaks [place] her daughter translates.  She has had 3 admissions to the hospital in 2019 with asthma exacerbations including most recently in September 2019.  She had not had further hospital admissions thus far this year but required 2 courses of prednisone in May 2020 which caused her blood sugar to spike.  She has not been very compliant with her medications, she has a Symbicort inhaler, however at her last visit she noted that it had run out and she had not bothered refilling it subsequently her asthma became worse.  At last visit she was strongly advised to make sure that she uses her Symbicort inhaler daily as instructed, use albuterol inhaler as needed.  She continues to rely on and asked for courses of prednisone, however we have instructed her that this is only a rescue medication and has several deleterious effects therefore she should be maintained on her regular medications and use prednisone only for exacerbations.  She was asked to continue azithromycin 2 and 50 mg 5 days/week.  She previously had been evaluated for an injectable biologic agent for asthma but declined because she did not want injections.   At last visit she was reminded to use Symbicort inhaler 2 puffs twice daily, in addition to her Xopenex nebulizer.  We previously considered her for Nucala but declined anything that involve injections.  She was therefore  maintained on azithromycin 5 times weekly. Patient is noted to have her breathing getting worse, and they were worried that she had to go to ED, she went to see her physician the next day and received prednisone which helped.   She is not taking symbicort because it ran out and she is only using  albuterol inhaler. She has continued on azithromycin 5 days per week but does not feel that it is helping.  She recently got a course of prednisone, and feels that it helps, she would like to stay on the prednisone.    **IgE 12/24/17; IGe 5.  **CXR 11/05/17; changes of chronic bronchitis, right heart enlargement.  **CBC 02/11/18>> absolute eosinophil count 800. **CBC 09/25/17; Abs eos count 300.    Medication:    Current Outpatient Medications:  .  ACCU-CHEK FASTCLIX LANCETS MISC, Use as directed to check blood glucose twice daily, Disp: 100 each, Rfl: 3 .  albuterol (PROVENTIL) (2.5 MG/3ML) 0.083% nebulizer solution, USE 1 VIAL IN NEBULIZER THREE TIMES DAILY, Disp: 225 mL, Rfl: 0 .  AMBULATORY NON FORMULARY MEDICATION, Medication Name: Aero chamber use as directed. 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  azithromycin (ZITHROMAX) 250 MG tablet, Take 1 tablet (250 mg total) by mouth daily. Take 1 tablet (250 mg) by mouth 5 days a week., Disp: 60 each, Rfl: 0 .  Blood Glucose Monitoring Suppl (ACCU-CHEK NANO SMARTVIEW) w/Device KIT, Use as directed to check blood glucose twice daily, Disp: 1 kit, Rfl: 0 .  budesonide (PULMICORT) 0.5 MG/2ML nebulizer solution, Take 2 mLs (0.5 mg total) by nebulization 2 (two) times daily., Disp: 75 mL, Rfl: 12 .  budesonide-formoterol (SYMBICORT) 160-4.5 MCG/ACT inhaler, Inhale 2 puffs into the lungs 2 (two) times daily. (Patient not taking: Reported on 10/10/2018), Disp: 1 Inhaler, Rfl: 6 .  budesonide-formoterol (SYMBICORT) 160-4.5 MCG/ACT inhaler, Inhale 2 puffs into the lungs 2 (two) times daily. Rinse mouth after use, Disp: 1 Inhaler, Rfl: 12 .  carvedilol (COREG) 12.5 MG tablet, Take 1 tablet (12.5 mg total) by mouth 2 (two) times daily with a meal., Disp: 180 tablet, Rfl: 0 .  empagliflozin (JARDIANCE) 25 MG TABS tablet, Take 25 mg by mouth daily., Disp: 90 tablet, Rfl: 1 .  glucose blood (ACCU-CHEK SMARTVIEW) test strip, Use as directed to check blood glucose twice  daily, Disp: 100 each, Rfl: 3 .  Insulin Glargine (LANTUS SOLOSTAR) 100 UNIT/ML Solostar Pen, Inject 15 Units into the skin daily. Use while taking steroids only, Disp: 5 pen, Rfl: 1 .  Insulin Pen Needle (NOVOFINE) 32G X 6 MM MISC, Use as directed to inject insulin daily (Patient not taking: Reported on 10/10/2018), Disp: 100 each, Rfl: 3 .  levalbuterol (XOPENEX) 1.25 MG/3ML nebulizer solution, Take 1.25 mg by nebulization every 6 (six) hours as needed for wheezing. DX:J44.9, Disp: 216 mL, Rfl: 5 .  losartan (COZAAR) 50 MG tablet, Take 1 tablet (50 mg total) by mouth daily., Disp: 90 tablet, Rfl: 3 .  metFORMIN (GLUCOPHAGE) 1000 MG tablet, Take 1 tablet (1,000 mg total) by mouth 2 (two) times daily with a meal., Disp: 180 tablet, Rfl: 3 .  montelukast (SINGULAIR) 10 MG tablet, Take 1 tablet (10 mg total) by mouth at bedtime., Disp: 90 tablet, Rfl: 1 .  predniSONE (DELTASONE) 10 MG tablet, Day 1-3: take '40mg'$  PO daily Day 4-6: take '30mg'$  PO daily Day 7-9: take '20mg'$  PO daily Day 10-12: take '10mg'$  POdaily, Disp: 30 tablet, Rfl: 0 .  PRESCRIPTION MEDICATION, **Non-US market drug** Zyrova  C (rosuvastatin/clopidigrel) Take 1 capsule by mouth every evening, Disp: , Rfl:  .  rosuvastatin (CRESTOR) 5 MG tablet, Take 1 tablet (5 mg total) by mouth daily., Disp: 90 tablet, Rfl: 3 .  sitaGLIPtin (JANUVIA) 100 MG tablet, Take 1 tablet (100 mg total) by mouth daily., Disp: 90 tablet, Rfl: 1 .  THEO-24 200 MG 24 hr capsule, TAKE 1 CAPSULE BY MOUTH ONCE DAILY, Disp: 90 capsule, Rfl: 2   Allergies:  Patient has no known allergies.    LABORATORY PANEL:   CBC No results for input(s): WBC, HGB, HCT, PLT in the last 168 hours. ------------------------------------------------------------------------------------------------------------------  Chemistries  No results for input(s): NA, K, CL, CO2, GLUCOSE, BUN, CREATININE, CALCIUM, MG, AST, ALT, ALKPHOS, BILITOT in the last 168 hours.  Invalid input(s): GFRCGP  ------------------------------------------------------------------------------------------------------------------  Cardiac Enzymes No results for input(s): TROPONINI in the last 168 hours. ------------------------------------------------------------  RADIOLOGY:   No results found for this or any previous visit. Results for orders placed during the hospital encounter of 11/05/17  DG Chest 2 View   Narrative CLINICAL DATA:  Worsening shortness of breath since last night. History of COPD with current symptoms not responsive to medication. Minimally productive cough.  EXAM: CHEST - 2 VIEW  COMPARISON:  Portable chest x-ray of September 25, 2017  FINDINGS: The lungs are adequately inflated. The interstitial markings are coarse. There is no alveolar infiltrate. There is no pleural effusion. The heart is top-normal in size. The pulmonary vascularity is prominent centrally on the right but stable. There is calcification in the wall of the aortic arch. The bony thorax is unremarkable.  IMPRESSION: Chronic bronchitic changes with possible superimposed acute bronchitis. No alveolar pneumonia nor CHF.  Thoracic aortic atherosclerosis.   Electronically Signed   By: David  Martinique M.D.   On: 11/05/2017 17:01    ------------------------------------------------------------------------------------------------------------------  Thank  you for allowing [facility] to assist in the care of your patient. Our recommendations are noted above.  Please contact us if we can be of further service.   Marda Stalker, M.D., F.C.C.P.  Board Certified in Internal Medicine, Pulmonary Medicine, [place], and Sleep Medicine.   Pulmonary and Critical Care Office Number: [phone]  12/16/2018

## 2018-12-17 ENCOUNTER — Ambulatory Visit: Payer: Medicaid Other | Admitting: Internal Medicine

## 2018-12-17 ENCOUNTER — Ambulatory Visit (INDEPENDENT_AMBULATORY_CARE_PROVIDER_SITE_OTHER): Payer: Medicaid Other | Admitting: Internal Medicine

## 2018-12-17 DIAGNOSIS — J449 Chronic obstructive pulmonary disease, unspecified: Secondary | ICD-10-CM | POA: Diagnosis not present

## 2018-12-17 DIAGNOSIS — J4550 Severe persistent asthma, uncomplicated: Secondary | ICD-10-CM

## 2018-12-17 DIAGNOSIS — R0609 Other forms of dyspnea: Secondary | ICD-10-CM | POA: Diagnosis not present

## 2018-12-17 MED ORDER — MONTELUKAST SODIUM 10 MG PO TABS: 10.0000 mg | ORAL_TABLET | Freq: Every day | ORAL | 3 refills | Status: DC

## 2018-12-17 MED ORDER — AZITHROMYCIN 250 MG PO TABS: 250.0000 mg | ORAL_TABLET | Freq: Every day | ORAL | 3 refills | Status: DC

## 2018-12-17 NOTE — Patient Instructions (Signed)
Stop theophylline.  Change azithromycin to 3 days per week.  Continue nebulizer, symbicort

## 2018-12-17 NOTE — Progress Notes (Signed)
* [facility]   Virtual Visit via video visit note I connected with patient on 12/17/18 at  4:30 PM EDT by telephone and verified that I am speaking with the correct person using two identifiers.   I discussed the limitations, risks, security and privacy concerns of performing an evaluation and management service by video visit and the availability of in person appointments. I also discussed with the patient that there may be a patient responsible charge related to this service. The patient expressed understanding and agreed to proceed. I discussed the assessment and treatment plan with the patient. The patient was provided an opportunity to ask questions and all were answered. The patient agreed with the plan and demonstrated an understanding of the instructions. Please see note below for further detail.    The patient was advised to call back or seek an in-person evaluation if the symptoms worsen or if the condition fails to improve as anticipated.    Whitney Hobby, MD     Assessment and Plan: Acute severe asthma with multiple exacerbations. Asthma/chronic bronchitis with multiple exacerbations. Noncompliance with medications. -Severe persistent asthma, daily symptoms at high risk of complications.   -I again reiterated the importance of staying on a maintenance medication, Symbicort 2 puffs twice daily. Continue pulmicort nebs twice daily.   Chronic bronchitis/COPD with multiple exacerbations and numerous hospital admissions. - Decrease azithromycin 250 mg to 3 days/week.  Dyspnea on exertion. - Secondary to above.  Continue above medications.   Meds ordered this encounter  Medications  . azithromycin (ZITHROMAX) 250 MG tablet    Sig: Take 1 tablet (250 mg total) by mouth daily. Take one tablet every Monday, Wednesday, Friday    Dispense:  40 tablet    Refill:  3  . montelukast (SINGULAIR) 10 MG tablet    Sig: Take 1 tablet (10 mg total) by mouth  at bedtime.    Dispense:  90 tablet    Refill:  3     Return in about 6 months (around 06/19/2019).    Date: 12/17/2018  MRN# [id number] Whitney Fleming [birth year]   Whitney Fleming is a 73 y.o. old female seen in follow up for chief complaint of dyspnea.   HPI:  Whitney Fleming is a 73 y.o. female  with severe allergic asthma.  She speaks [place] her daughter translates.  She has had 3 admissions to the hospital in 2019 with asthma exacerbations including most recently in September 2019.  She had not had further hospital admissions thus far this year but required 2 courses of prednisone in May 2020 which caused her blood sugar to spike.  She has not been very compliant with her medications, she has a Symbicort inhaler, however at her last visit she noted that it had run out and she had not bothered refilling it subsequently her asthma became worse.  At last visit she was strongly advised to make sure that she uses her Symbicort inhaler daily as instructed, use albuterol inhaler as needed.  She continues to rely on and asked for courses of prednisone, however we have instructed her that this is only a rescue medication and has several deleterious effects therefore she should be maintained on her regular medications and use prednisone only for exacerbations.  She was asked to continue azithromycin 2 and 50 mg 5 days/week.  She previously had been evaluated for an injectable biologic agent for asthma but declined because she did not want injections.   At last visit she was reminded  to use Symbicort inhaler 2 puffs twice daily, in addition to her Xopenex nebulizer.  We previously considered her for Nucala but declined anything that involve injections.  She was therefore maintained on azithromycin 5 times weekly. Patient is noted to have her breathing getting worse, and they were worried that she had to go to ED, she went to see her physician the next day and received prednisone which helped.   She has now  started taking symbicort 2 puffs twice per day and rinses per day. She is taking singulair, azithro 5 days per week. She is off the prednisone and doing well with that. She is using rescue inhaler she is using 2 times per day, pulmicort nebs twice per day.    **IgE 12/24/17; IGe 5.  **CXR 11/05/17; changes of chronic bronchitis, right heart enlargement.  **CBC 02/11/18>> absolute eosinophil count 800. **CBC 09/25/17; Abs eos count 300.    Medication:    Current Outpatient Medications:  .  ACCU-CHEK FASTCLIX LANCETS MISC, Use as directed to check blood glucose twice daily, Disp: 100 each, Rfl: 3 .  albuterol (PROVENTIL) (2.5 MG/3ML) 0.083% nebulizer solution, USE 1 VIAL IN NEBULIZER THREE TIMES DAILY, Disp: 225 mL, Rfl: 0 .  AMBULATORY NON FORMULARY MEDICATION, Medication Name: Aero chamber use as directed. 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  azithromycin (ZITHROMAX) 250 MG tablet, Take 1 tablet (250 mg total) by mouth daily. Take 1 tablet (250 mg) by mouth 5 days a week., Disp: 60 each, Rfl: 0 .  Blood Glucose Monitoring Suppl (ACCU-CHEK NANO SMARTVIEW) w/Device KIT, Use as directed to check blood glucose twice daily, Disp: 1 kit, Rfl: 0 .  budesonide (PULMICORT) 0.5 MG/2ML nebulizer solution, Take 2 mLs (0.5 mg total) by nebulization 2 (two) times daily., Disp: 75 mL, Rfl: 12 .  budesonide-formoterol (SYMBICORT) 160-4.5 MCG/ACT inhaler, Inhale 2 puffs into the lungs 2 (two) times daily. (Patient not taking: Reported on 10/10/2018), Disp: 1 Inhaler, Rfl: 6 .  budesonide-formoterol (SYMBICORT) 160-4.5 MCG/ACT inhaler, Inhale 2 puffs into the lungs 2 (two) times daily. Rinse mouth after use, Disp: 1 Inhaler, Rfl: 12 .  carvedilol (COREG) 12.5 MG tablet, Take 1 tablet (12.5 mg total) by mouth 2 (two) times daily with a meal., Disp: 180 tablet, Rfl: 0 .  empagliflozin (JARDIANCE) 25 MG TABS tablet, Take 25 mg by mouth daily., Disp: 90 tablet, Rfl: 1 .  glucose blood (ACCU-CHEK SMARTVIEW) test strip, Use  as directed to check blood glucose twice daily, Disp: 100 each, Rfl: 3 .  Insulin Glargine (LANTUS SOLOSTAR) 100 UNIT/ML Solostar Pen, Inject 15 Units into the skin daily. Use while taking steroids only, Disp: 5 pen, Rfl: 1 .  Insulin Pen Needle (NOVOFINE) 32G X 6 MM MISC, Use as directed to inject insulin daily (Patient not taking: Reported on 10/10/2018), Disp: 100 each, Rfl: 3 .  levalbuterol (XOPENEX) 1.25 MG/3ML nebulizer solution, Take 1.25 mg by nebulization every 6 (six) hours as needed for wheezing. DX:J44.9, Disp: 216 mL, Rfl: 5 .  losartan (COZAAR) 50 MG tablet, Take 1 tablet (50 mg total) by mouth daily., Disp: 90 tablet, Rfl: 3 .  metFORMIN (GLUCOPHAGE) 1000 MG tablet, Take 1 tablet (1,000 mg total) by mouth 2 (two) times daily with a meal., Disp: 180 tablet, Rfl: 3 .  montelukast (SINGULAIR) 10 MG tablet, Take 1 tablet (10 mg total) by mouth at bedtime., Disp: 90 tablet, Rfl: 1 .  predniSONE (DELTASONE) 10 MG tablet, Day 1-3: take '40mg'$  PO daily Day 4-6:  take '30mg'$  PO daily Day 7-9: take '20mg'$  PO daily Day 10-12: take '10mg'$  POdaily, Disp: 30 tablet, Rfl: 0 .  PRESCRIPTION MEDICATION, **Non-US market drug** Zyrova C (rosuvastatin/clopidigrel) Take 1 capsule by mouth every evening, Disp: , Rfl:  .  rosuvastatin (CRESTOR) 5 MG tablet, Take 1 tablet (5 mg total) by mouth daily., Disp: 90 tablet, Rfl: 3 .  sitaGLIPtin (JANUVIA) 100 MG tablet, Take 1 tablet (100 mg total) by mouth daily., Disp: 90 tablet, Rfl: 1 .  THEO-24 200 MG 24 hr capsule, TAKE 1 CAPSULE BY MOUTH ONCE DAILY, Disp: 90 capsule, Rfl: 2   Allergies:  Patient has no known allergies.  Review of Systems:  Constitutional: Feels well. Cardiovascular: Denies chest pain, exertional chest pain.  Pulmonary: Denies hemoptysis, pleuritic chest pain.   The remainder of systems were reviewed and were found to be negative other than what is documented in the HPI.      LABORATORY PANEL:   CBC No results for input(s): WBC, HGB, HCT,  PLT in the last 168 hours. ------------------------------------------------------------------------------------------------------------------  Chemistries  No results for input(s): NA, K, CL, CO2, GLUCOSE, BUN, CREATININE, CALCIUM, MG, AST, ALT, ALKPHOS, BILITOT in the last 168 hours.  Invalid input(s): GFRCGP ------------------------------------------------------------------------------------------------------------------  Cardiac Enzymes No results for input(s): TROPONINI in the last 168 hours. ------------------------------------------------------------  RADIOLOGY:   No results found for this or any previous visit. Results for orders placed during the hospital encounter of 11/05/17  DG Chest 2 View   Narrative CLINICAL DATA:  Worsening shortness of breath since last night. History of COPD with current symptoms not responsive to medication. Minimally productive cough.  EXAM: CHEST - 2 VIEW  COMPARISON:  Portable chest x-ray of September 25, 2017  FINDINGS: The lungs are adequately inflated. The interstitial markings are coarse. There is no alveolar infiltrate. There is no pleural effusion. The heart is top-normal in size. The pulmonary vascularity is prominent centrally on the right but stable. There is calcification in the wall of the aortic arch. The bony thorax is unremarkable.  IMPRESSION: Chronic bronchitic changes with possible superimposed acute bronchitis. No alveolar pneumonia nor CHF.  Thoracic aortic atherosclerosis.   Electronically Signed   By: David  Martinique M.D.   On: 11/05/2017 17:01    ------------------------------------------------------------------------------------------------------------------  Thank  you for allowing [facility] to assist in the care of your patient. Our recommendations are noted above.  Please contact us if we can be of further service.   Marda Stalker, M.D., F.C.C.P.  Board Certified in Internal  Medicine, Pulmonary Medicine, [place], and Sleep Medicine.  [facility] Office Number: [phone]  12/17/2018

## 2018-12-22 ENCOUNTER — Other Ambulatory Visit: Payer: Self-pay | Admitting: Internal Medicine

## 2018-12-24 ENCOUNTER — Ambulatory Visit: Payer: Medicaid Other | Admitting: Family Medicine

## 2018-12-25 ENCOUNTER — Inpatient Hospital Stay: Payer: Medicaid Other | Admitting: Oncology

## 2018-12-25 ENCOUNTER — Telehealth (INDEPENDENT_AMBULATORY_CARE_PROVIDER_SITE_OTHER): Payer: Medicaid Other | Admitting: Family Medicine

## 2018-12-25 ENCOUNTER — Inpatient Hospital Stay: Payer: Medicaid Other

## 2018-12-25 DIAGNOSIS — E1165 Type 2 diabetes mellitus with hyperglycemia: Secondary | ICD-10-CM | POA: Diagnosis not present

## 2018-12-25 MED ORDER — LANTUS SOLOSTAR 100 UNIT/ML ~~LOC~~ SOPN: 10.0000 [IU] | PEN_INJECTOR | Freq: Every day | SUBCUTANEOUS | 1 refills | Status: DC

## 2018-12-25 MED ORDER — BUDESONIDE-FORMOTEROL FUMARATE 160-4.5 MCG/ACT IN AERO: 2.0000 | INHALATION_SPRAY | Freq: Two times a day (BID) | RESPIRATORY_TRACT | 3 refills | Status: DC

## 2018-12-25 MED ORDER — NOVOFINE 32G X 6 MM MISC: 3 refills | Status: DC

## 2018-12-25 NOTE — Progress Notes (Signed)
Patient: Whitney Fleming Female    DOB: Aug 19, 1945   73 y.o.   MRN: [id number] Visit Date: 12/25/2018  Today's Provider: Lavon Paganini, MD   Chief Complaint  Patient presents with  . Diabetes   Subjective:    I, [name] CMA, am acting as a scribe for Lavon Paganini, MD.   Virtual Visit via Video Note  I connected with Whitney Fleming on 12/25/18 at  9:40 AM EDT by a video enabled telemedicine application and verified that I am speaking with the correct person using two identifiers.   Patient location: home Provider location: [place] involved in the visit: patient, provider, patient's daughter   I discussed the limitations of evaluation and management by telemedicine and the availability of in person appointments. The patient expressed understanding and agreed to proceed.  Interactive audio and video communications were attempted, although failed due to patient's inability to connect to video. Continued visit with audio only interaction with patient agreement.   Of note, patient speaks [place] and she and her family have declined formal language interpretation services.  Her daughter interprets for [place]    HPI  Diabetes Mellitus Type II, Follow-up:   Lab Results  Component Value Date   HGBA1C 10.2 (A) 11/06/2018   HGBA1C 9.1 (A) 08/07/2018   HGBA1C 9.1 (A) 05/01/2018    Last seen for diabetes 2 months ago.  Management since then includes: add daily Lantus 15 units in the morning. She reports good compliance with treatment. She is not having side effects.  Current symptoms include none and have been stable. Home blood sugar records: - Ate birthday cake yesterday and fasting CBG was 220 this morning.  Her daughter believes that these are typically in the 140s.  They did not start Lantus at last visit as it was previously only used with prednisone and they believe they were only supposed to continue with prednisone  Episodes of  hypoglycemia? no   Current insulin regiment: yes Most Recent Eye Exam:unsure Weight trend: stable Prior visit with dietician: No Current exercise: none Current diet habits: well balanced  Pertinent Labs:    Component Value Date/Time   CHOL 110 01/17/2018 0823   CHOL 91 12/25/2012 0154   TRIG 103 01/17/2018 0823   TRIG 143 12/25/2012 0154   HDL 37 (L) 01/17/2018 0823   HDL 26 (L) 12/25/2012 0154   LDLCALC 52 01/17/2018 0823   LDLCALC 36 12/25/2012 0154   CREATININE 1.30 (H) 10/10/2018 1106   CREATININE 1.16 12/26/2012 0545    Wt Readings from Last 3 Encounters:  10/10/18 166 lb (75.3 kg)  10/10/18 163 lb 3.2 oz (74 kg)  08/07/18 165 lb 6.4 oz (75 kg)     ------------------------------------------------------------------------  No Known Allergies   Current Outpatient Medications:  .  azithromycin (ZITHROMAX) 250 MG tablet, Take 1 tablet (250 mg total) by mouth daily. Take one tablet every Monday, Wednesday, Friday, Disp: 40 tablet, Rfl: 3 .  Blood Glucose Monitoring Suppl (ACCU-CHEK NANO SMARTVIEW) w/Device KIT, Use as directed to check blood glucose twice daily, Disp: 1 kit, Rfl: 0 .  budesonide (PULMICORT) 0.5 MG/2ML nebulizer solution, Take 2 mLs (0.5 mg total) by nebulization 2 (two) times daily., Disp: 75 mL, Rfl: 12 .  budesonide-formoterol (SYMBICORT) 160-4.5 MCG/ACT inhaler, Inhale 2 puffs into the lungs 2 (two) times daily. Rinse mouth after use, Disp: 1 Inhaler, Rfl: 12 .  carvedilol (COREG) 12.5 MG tablet, Take 1 tablet (12.5 mg total) by  mouth 2 (two) times daily with a meal., Disp: 180 tablet, Rfl: 0 .  empagliflozin (JARDIANCE) 25 MG TABS tablet, Take 25 mg by mouth daily., Disp: 90 tablet, Rfl: 1 .  glucose blood (ACCU-CHEK SMARTVIEW) test strip, Use as directed to check blood glucose twice daily, Disp: 100 each, Rfl: 3 .  Insulin Glargine (LANTUS SOLOSTAR) 100 UNIT/ML Solostar Pen, Inject 15 Units into the skin daily. Use while taking steroids only, Disp: 5  pen, Rfl: 1 .  levalbuterol (XOPENEX) 1.25 MG/3ML nebulizer solution, Take 1.25 mg by nebulization every 6 (six) hours as needed for wheezing. DX:J44.9, Disp: 216 mL, Rfl: 5 .  losartan (COZAAR) 50 MG tablet, Take 1 tablet (50 mg total) by mouth daily., Disp: 90 tablet, Rfl: 3 .  metFORMIN (GLUCOPHAGE) 1000 MG tablet, Take 1 tablet (1,000 mg total) by mouth 2 (two) times daily with a meal., Disp: 180 tablet, Rfl: 3 .  montelukast (SINGULAIR) 10 MG tablet, Take 1 tablet (10 mg total) by mouth at bedtime., Disp: 90 tablet, Rfl: 1 .  montelukast (SINGULAIR) 10 MG tablet, Take 1 tablet (10 mg total) by mouth at bedtime., Disp: 90 tablet, Rfl: 3 .  predniSONE (DELTASONE) 10 MG tablet, Day 1-3: take '40mg'$  PO daily Day 4-6: take '30mg'$  PO daily Day 7-9: take '20mg'$  PO daily Day 10-12: take '10mg'$  POdaily, Disp: 30 tablet, Rfl: 0 .  PRESCRIPTION MEDICATION, **Non-US market drug** Zyrova C (rosuvastatin/clopidigrel) Take 1 capsule by mouth every evening, Disp: , Rfl:  .  rosuvastatin (CRESTOR) 5 MG tablet, Take 1 tablet (5 mg total) by mouth daily., Disp: 90 tablet, Rfl: 3 .  sitaGLIPtin (JANUVIA) 100 MG tablet, Take 1 tablet (100 mg total) by mouth daily., Disp: 90 tablet, Rfl: 1 .  THEO-24 200 MG 24 hr capsule, TAKE 1 CAPSULE BY MOUTH ONCE DAILY, Disp: 90 capsule, Rfl: 2 .  ACCU-CHEK FASTCLIX LANCETS MISC, Use as directed to check blood glucose twice daily, Disp: 100 each, Rfl: 3 .  albuterol (PROVENTIL) (2.5 MG/3ML) 0.083% nebulizer solution, USE 1 VIAL IN NEBULIZER THREE TIMES DAILY, Disp: 225 mL, Rfl: 0 .  AMBULATORY NON FORMULARY MEDICATION, Medication Name: Aero chamber use as directed. 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  azithromycin (ZITHROMAX) 250 MG tablet, Take 1 tablet (250 mg total) by mouth daily. Take 1 tablet (250 mg) by mouth 5 days a week., Disp: 60 each, Rfl: 0 .  budesonide-formoterol (SYMBICORT) 160-4.5 MCG/ACT inhaler, Inhale 2 puffs into the lungs 2 (two) times daily. (Patient not taking:  Reported on 10/10/2018), Disp: 1 Inhaler, Rfl: 6 .  Insulin Pen Needle (NOVOFINE) 32G X 6 MM MISC, Use as directed to inject insulin daily (Patient not taking: Reported on 10/10/2018), Disp: 100 each, Rfl: 3  Review of Systems  Constitutional: Negative.   HENT: Negative.   Respiratory: Negative.   Cardiovascular: Negative.   Genitourinary: Negative.   Neurological: Negative.     Social History   Tobacco Use  . Smoking status: Never Smoker  . Smokeless tobacco: Never Used  Substance Use Topics  . Alcohol use: Never    Frequency: Never      Objective:   There were no vitals taken for this visit. There were no vitals filed for this visit.   Physical Exam   No results found for any visits on 12/25/18.     Assessment & Plan    I discussed the assessment and treatment plan with the patient. The patient was provided an opportunity to ask questions  and all were answered. The patient agreed with the plan and demonstrated an understanding of the instructions.   The patient was advised to call back or seek an in-person evaluation if the symptoms worsen or if the condition fails to improve as anticipated.  Problem List Items Addressed This Visit      Endocrine   Diabetes ([place])    Uncontrolled with last A1c 10.2 Multiple courses of prednisone over the last year that seem to have worsened this Patient did not tolerate Bydureon and is not agreeable to any GLP-1 therapy because she thinks the shots hurt too much We will continue Januvia, Jardiance, metformin at current dose I had instructed the family to add Lantus daily at last visit, but they did not, so we will add Lantus 10 units daily On statin and losartan Follow-up in about 6 weeks and repeat A1c Further dose titrate Lantus at that time      Relevant Medications   Insulin Glargine (LANTUS SOLOSTAR) 100 UNIT/ML Solostar Pen       Return in about 6 weeks (around 02/05/2019) for chronic disease f/u after 8/28.   The  entirety of the information documented in the History of Present Illness, Review of Systems and Physical Exam were personally obtained by me. Portions of this information were initially documented by [facility], CMA and reviewed by me for thoroughness and accuracy.    Elmar Antigua, Dionne Bucy, MD MPH [facility]

## 2018-12-25 NOTE — Assessment & Plan Note (Signed)
Uncontrolled with last A1c 10.2 Multiple courses of prednisone over the last year that seem to have worsened this Patient did not tolerate Bydureon and is not agreeable to any GLP-1 therapy because she thinks the shots hurt too much We will continue Januvia, Jardiance, metformin at current dose I had instructed the family to add Lantus daily at last visit, but they did not, so we will add Lantus 10 units daily On statin and losartan Follow-up in about 6 weeks and repeat A1c Further dose titrate Lantus at that time

## 2019-01-16 ENCOUNTER — Other Ambulatory Visit: Payer: Self-pay | Admitting: Family Medicine

## 2019-01-16 DIAGNOSIS — E1165 Type 2 diabetes mellitus with hyperglycemia: Secondary | ICD-10-CM

## 2019-01-19 ENCOUNTER — Other Ambulatory Visit: Payer: Self-pay

## 2019-01-19 DIAGNOSIS — E1165 Type 2 diabetes mellitus with hyperglycemia: Secondary | ICD-10-CM

## 2019-01-19 MED ORDER — ACCU-CHEK SMARTVIEW VI STRP: ORAL_STRIP | 3 refills | Status: DC

## 2019-01-31 ENCOUNTER — Other Ambulatory Visit: Payer: Self-pay | Admitting: Internal Medicine

## 2019-02-04 ENCOUNTER — Other Ambulatory Visit: Payer: Self-pay | Admitting: Family Medicine

## 2019-02-04 MED ORDER — ACCU-CHEK GUIDE VI STRP: ORAL_STRIP | 12 refills | Status: DC

## 2019-02-12 ENCOUNTER — Other Ambulatory Visit: Payer: Medicaid Other

## 2019-02-12 ENCOUNTER — Ambulatory Visit: Payer: Medicaid Other | Admitting: Oncology

## 2019-03-05 ENCOUNTER — Inpatient Hospital Stay: Payer: Medicaid Other | Admitting: Oncology

## 2019-03-05 ENCOUNTER — Inpatient Hospital Stay: Payer: Medicaid Other

## 2019-03-28 ENCOUNTER — Other Ambulatory Visit: Payer: Self-pay | Admitting: Internal Medicine

## 2019-04-03 ENCOUNTER — Inpatient Hospital Stay: Payer: Medicaid Other | Attending: Oncology

## 2019-04-03 ENCOUNTER — Inpatient Hospital Stay: Payer: Medicaid Other | Admitting: Oncology

## 2019-04-23 ENCOUNTER — Encounter: Payer: Self-pay | Admitting: Oncology

## 2019-04-23 ENCOUNTER — Inpatient Hospital Stay: Payer: Medicaid Other | Attending: Oncology

## 2019-04-23 ENCOUNTER — Inpatient Hospital Stay (HOSPITAL_BASED_OUTPATIENT_CLINIC_OR_DEPARTMENT_OTHER): Payer: Medicaid Other | Admitting: Oncology

## 2019-04-23 ENCOUNTER — Other Ambulatory Visit: Payer: Self-pay

## 2019-04-23 VITALS — BP 119/45 | HR 75 | Temp 97.3°F | Resp 16 | Wt 161.7 lb

## 2019-04-23 DIAGNOSIS — Z794 Long term (current) use of insulin: Secondary | ICD-10-CM | POA: Diagnosis not present

## 2019-04-23 DIAGNOSIS — E785 Hyperlipidemia, unspecified: Secondary | ICD-10-CM | POA: Insufficient documentation

## 2019-04-23 DIAGNOSIS — D649 Anemia, unspecified: Secondary | ICD-10-CM | POA: Insufficient documentation

## 2019-04-23 DIAGNOSIS — Z79899 Other long term (current) drug therapy: Secondary | ICD-10-CM | POA: Insufficient documentation

## 2019-04-23 DIAGNOSIS — D638 Anemia in other chronic diseases classified elsewhere: Secondary | ICD-10-CM

## 2019-04-23 DIAGNOSIS — D472 Monoclonal gammopathy: Secondary | ICD-10-CM | POA: Diagnosis not present

## 2019-04-23 DIAGNOSIS — E1165 Type 2 diabetes mellitus with hyperglycemia: Secondary | ICD-10-CM | POA: Insufficient documentation

## 2019-04-23 DIAGNOSIS — I119 Hypertensive heart disease without heart failure: Secondary | ICD-10-CM | POA: Diagnosis not present

## 2019-04-23 DIAGNOSIS — J45909 Unspecified asthma, uncomplicated: Secondary | ICD-10-CM | POA: Diagnosis not present

## 2019-04-23 DIAGNOSIS — J449 Chronic obstructive pulmonary disease, unspecified: Secondary | ICD-10-CM | POA: Insufficient documentation

## 2019-04-23 LAB — CBC WITH DIFFERENTIAL/PLATELET
Abs Immature Granulocytes: 0.05 10*3/uL (ref 0.00–0.07)
Basophils Absolute: 0.1 10*3/uL (ref 0.0–0.1)
Basophils Relative: 1 %
Eosinophils Absolute: 0.4 10*3/uL (ref 0.0–0.5)
Eosinophils Relative: 5 %
HCT: 42.2 % (ref 36.0–46.0)
Hemoglobin: 13.3 g/dL (ref 12.0–15.0)
Immature Granulocytes: 1 %
Lymphocytes Relative: 27 %
Lymphs Abs: 2.3 10*3/uL (ref 0.7–4.0)
MCH: 25.6 pg — ABNORMAL LOW (ref 26.0–34.0)
MCHC: 31.5 g/dL (ref 30.0–36.0)
MCV: 81.3 fL (ref 80.0–100.0)
Monocytes Absolute: 0.7 10*3/uL (ref 0.1–1.0)
Monocytes Relative: 9 %
Neutro Abs: 4.9 10*3/uL (ref 1.7–7.7)
Neutrophils Relative %: 57 %
Platelets: 212 10*3/uL (ref 150–400)
RBC: 5.19 MIL/uL — ABNORMAL HIGH (ref 3.87–5.11)
RDW: 14.1 % (ref 11.5–15.5)
WBC: 8.5 10*3/uL (ref 4.0–10.5)
nRBC: 0 % (ref 0.0–0.2)

## 2019-04-23 NOTE — Progress Notes (Signed)
Pt has been in hospital for breathing issues and now has on atb long term to keep her from being hospitalized .

## 2019-04-24 ENCOUNTER — Encounter: Payer: Self-pay | Admitting: Oncology

## 2019-04-24 NOTE — Progress Notes (Signed)
Hematology/Oncology Consult note [facility]  [phone] [phone]  Patient Care Team: [name], MD as PCP - General (Family Medicine)   Name of the patient: Whitney Fleming  [id number]  1945-10-06   Date of visit: 04/24/19  Diagnosis- 1.  Normocytic anemia likely anemia of chronic disease 2.  MGUS  Chief complaint/ Reason for visit-routine follow-up of anemia and MGUS  Heme/Onc history: patient is a 73 year old female with a past medical history significant for hypertension, hyperlipidemia diabetes and asthma. She has been referred to [place] for evaluation of anemia. Patient's baseline hemoglobin has been around 11-12 this year. However the most recent hemoglobin on 01/17/2018 was 9.4/28.1. White count was mildly elevated at 11.1 and platelets were normal at 410. Of note she has had mild microcytosis and her MCV ranges between 78-81. Recent B12 and folate were within normal limits. Patient has had intermittent hyperkalemia in the past. Iron studies from August 2019 showed iron saturation of 90%. Serum iron was normal at 58 and TIBC normal at 303. Last hemoglobin A1c in April 2019 was elevated at 8.1.Patient has chronic COPD and follows up with pulmonary for the same. She has been on and off steroids and often gets steroid-induced hyperglycemia  Results of blood work from 02/05/2018 were as follows: CBC showed white count of 11.5, H&H of 11.5/34.1 with an MCV of 82.8 and a platelet count of 185. Her hemoglobin actually improved from 9.4-11.5 from a month prior. CMP showed mildly elevated creatinine of 1.17. Blood sugars were elevated at 410. Multiple myeloma panel revealed IgG kappa monoclonal protein of 0.3 g. Kappa lambda light chain ratio was normal at 1.12. Reticulocyte count was normal at 2.4. Smear review was unremarkable. Haptoglobin levels were elevated at 233. ESR was elevated at 36. Ferritin was normal at 80.  TSH was normal at 1.9.   Interval history-overall she is doing well since her last visit.  Her chronic lung disease is stable.  She remains off prednisone.  She has occasional exertional shortness of breath.  ECOG PS- 1 Pain scale- 0 Opioid associated constipation- no  Review of systems- Review of Systems  Constitutional: Positive for malaise/fatigue. Negative for chills, fever and weight loss.  HENT: Negative for congestion, ear discharge and nosebleeds.   Eyes: Negative for blurred vision.  Respiratory: Negative for cough, hemoptysis, sputum production, shortness of breath and wheezing.   Cardiovascular: Negative for chest pain, palpitations, orthopnea and claudication.  Gastrointestinal: Negative for abdominal pain, blood in stool, constipation, diarrhea, heartburn, melena, nausea and vomiting.  Genitourinary: Negative for dysuria, flank pain, frequency, hematuria and urgency.  Musculoskeletal: Negative for back pain, joint pain and myalgias.  Skin: Negative for rash.  Neurological: Negative for dizziness, tingling, focal weakness, seizures, weakness and headaches.  Endo/Heme/Allergies: Does not bruise/bleed easily.  Psychiatric/Behavioral: Negative for depression and suicidal ideas. The patient does not have insomnia.       No Known Allergies   Past Medical History:  Diagnosis Date   Anemia    normocytic anemia   Asthma    Cardiomegaly    COPD (chronic obstructive pulmonary disease) (HCC)    Diabetes mellitus without complication ([place])    Hypertension    Hyponatremia      Past Surgical History:  Procedure Laterality Date   TUBAL LIGATION      Social History   Socioeconomic History   Marital status: Married    Spouse name: Not on file   Number of children: 4  Years of education: Not on file   Highest education level: Not on file  Occupational History   Occupation: homemaker  Social Needs   Financial resource strain: Not on file   Food  insecurity    Worry: Not on file    Inability: Not on file   Transportation needs    Medical: Not on file    Non-medical: Not on file  Tobacco Use   Smoking status: Never Smoker   Smokeless tobacco: Never Used  Substance and Sexual Activity   Alcohol use: Never    Frequency: Never   Drug use: Never   Sexual activity: Not Currently  Lifestyle   Physical activity    Days per week: Not on file    Minutes per session: Not on file   Stress: Not on file  Relationships   Social connections    Talks on phone: Not on file    Gets together: Not on file    Attends religious service: Not on file    Active member of club or organization: Not on file    Attends meetings of clubs or organizations: Not on file    Relationship status: Not on file   Intimate partner violence    Fear of current or ex partner: Not on file    Emotionally abused: Not on file    Physically abused: Not on file    Forced sexual activity: Not on file  Other Topics Concern   Not on file  Social History Narrative   Not on file    Family History  Problem Relation Age of Onset   Tuberculosis Other    Healthy Mother    Heart disease Father    Asthma Father    Cancer Neg Hx      Current Outpatient Medications:    ACCU-CHEK FASTCLIX LANCETS MISC, Use as directed to check blood glucose twice daily, Disp: 100 each, Rfl: 3   albuterol (PROVENTIL) (2.5 MG/3ML) 0.083% nebulizer solution, USE 1 VIAL IN NEBULIZER THREE TIMES DAILY, Disp: 225 mL, Rfl: 0   AMBULATORY NON FORMULARY MEDICATION, Medication Name: Aero chamber use as directed. DX:J45.909, Disp: 1 each, Rfl: 0   azithromycin (ZITHROMAX) 250 MG tablet, Take 1 tablet (250 mg total) by mouth daily. Take 1 tablet (250 mg) by mouth 5 days a week., Disp: 60 each, Rfl: 0   Blood Glucose Monitoring Suppl (ACCU-CHEK GUIDE) w/Device KIT, USE TO TEST BLOOD SUGAR TWICE DAILY, Disp: 1 kit, Rfl: 0   budesonide (PULMICORT) 0.5 MG/2ML nebulizer  solution, Take 2 mLs (0.5 mg total) by nebulization 2 (two) times daily., Disp: 75 mL, Rfl: 12   budesonide-formoterol (SYMBICORT) 160-4.5 MCG/ACT inhaler, Inhale 2 puffs into the lungs 2 (two) times daily. Rinse mouth after use, Disp: 3 Inhaler, Rfl: 3   carvedilol (COREG) 12.5 MG tablet, Take 1 tablet (12.5 mg total) by mouth 2 (two) times daily with a meal., Disp: 180 tablet, Rfl: 0   empagliflozin (JARDIANCE) 25 MG TABS tablet, Take 25 mg by mouth daily., Disp: 90 tablet, Rfl: 1   glucose blood (ACCU-CHEK GUIDE) test strip, Use as instructed to test blood glucose twice daily, Disp: 100 each, Rfl: 12   Insulin Glargine (LANTUS SOLOSTAR) 100 UNIT/ML Solostar Pen, Inject 10 Units into the skin daily., Disp: 5 pen, Rfl: 1   Insulin Pen Needle (NOVOFINE) 32G X 6 MM MISC, Use as directed to inject insulin daily, Disp: 100 each, Rfl: 3   losartan (COZAAR) 50 MG tablet, Take 1 tablet (  50 mg total) by mouth daily., Disp: 90 tablet, Rfl: 3   metFORMIN (GLUCOPHAGE) 1000 MG tablet, Take 1 tablet (1,000 mg total) by mouth 2 (two) times daily with a meal., Disp: 180 tablet, Rfl: 3   rosuvastatin (CRESTOR) 5 MG tablet, Take 1 tablet (5 mg total) by mouth daily., Disp: 90 tablet, Rfl: 3   sitaGLIPtin (JANUVIA) 100 MG tablet, Take 1 tablet (100 mg total) by mouth daily., Disp: 90 tablet, Rfl: 1   THEO-24 200 MG 24 hr capsule, TAKE 1 CAPSULE BY MOUTH ONCE DAILY, Disp: 90 capsule, Rfl: 2  Physical exam:  Vitals:   04/23/19 1429 04/23/19 1430  BP: (!) 119/45   Pulse: 75   Resp: 16   Temp:  (!) 97.3 F (36.3 C)  TempSrc:  Tympanic  Weight: 161 lb 11.2 oz (73.3 kg)    Physical Exam Constitutional:      General: She is not in acute distress. HENT:     Head: Normocephalic and atraumatic.  Eyes:     Pupils: Pupils are equal, round, and reactive to light.  Neck:     Musculoskeletal: Normal range of motion.  Cardiovascular:     Rate and Rhythm: Normal rate and regular rhythm.     Heart sounds:  Normal heart sounds.  Pulmonary:     Effort: Pulmonary effort is normal.     Breath sounds: Normal breath sounds.  Abdominal:     General: Bowel sounds are normal.     Palpations: Abdomen is soft.  Skin:    General: Skin is warm and dry.  Neurological:     Mental Status: She is alert and oriented to person, place, and time.      CMP Latest Ref Rng & Units 10/10/2018  Glucose 70 - 99 mg/dL 278(H)  BUN 8 - 23 mg/dL 23  Creatinine 0.44 - 1.00 mg/dL 1.30(H)  Sodium 135 - 145 mmol/L 132(L)  Potassium 3.5 - 5.1 mmol/L 4.8  Chloride 98 - 111 mmol/L 100  CO2 22 - 32 mmol/L 21(L)  Calcium 8.9 - 10.3 mg/dL 8.8(L)  Total Protein 6.5 - 8.1 g/dL 6.7  Total Bilirubin 0.3 - 1.2 mg/dL 0.7  Alkaline Phos 38 - 126 U/L 65  AST 15 - 41 U/L 13(L)  ALT 0 - 44 U/L 11   CBC Latest Ref Rng & Units 04/23/2019  WBC 4.0 - 10.5 K/uL 8.5  Hemoglobin 12.0 - 15.0 g/dL 13.3  Hematocrit 36.0 - 46.0 % 42.2  Platelets 150 - 400 K/uL 212      Assessment and plan- Patient is a 73 y.o. female who is here for following issues:  1.  Anemia of chronic disease: Her anemia has actually improved and over the last year and her hemoglobin is remained stable around 13.  By counting platelet counts are normal.  Continue to monitor  2.  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  Myeloma panel from today is pending.  I will see her back in 1 year with CBC with differential, CMP and myeloma panel   Visit Diagnosis 1. 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 Randa Evens, MD, MPH [facility] at [facility] [phone] 04/24/2019 12:03 PM

## 2019-04-25 LAB — MULTIPLE MYELOMA PANEL, SERUM
Albumin SerPl Elph-Mcnc: 3.7 g/dL (ref 2.9–4.4)
Albumin/Glob SerPl: 1.4 (ref 0.7–1.7)
Alpha 1: 0.2 g/dL (ref 0.0–0.4)
Alpha2 Glob SerPl Elph-Mcnc: 0.8 g/dL (ref 0.4–1.0)
B-Globulin SerPl Elph-Mcnc: 1 g/dL (ref 0.7–1.3)
Gamma Glob SerPl Elph-Mcnc: 0.8 g/dL (ref 0.4–1.8)
Globulin, Total: 2.8 g/dL (ref 2.2–3.9)
IgA: 150 mg/dL (ref 64–422)
IgG (Immunoglobin G), Serum: 971 mg/dL (ref 586–1602)
IgM (Immunoglobulin M), Srm: 89 mg/dL (ref 26–217)
M Protein SerPl Elph-Mcnc: 0.3 g/dL — ABNORMAL HIGH
Total Protein ELP: 6.5 g/dL (ref 6.0–8.5)

## 2019-05-11 ENCOUNTER — Other Ambulatory Visit: Payer: Self-pay | Admitting: Internal Medicine

## 2019-05-18 ENCOUNTER — Telehealth: Payer: Self-pay | Admitting: Internal Medicine

## 2019-05-18 ENCOUNTER — Ambulatory Visit (INDEPENDENT_AMBULATORY_CARE_PROVIDER_SITE_OTHER): Payer: Medicaid Other | Admitting: Internal Medicine

## 2019-05-18 ENCOUNTER — Other Ambulatory Visit: Payer: Self-pay

## 2019-05-18 ENCOUNTER — Encounter: Payer: Self-pay | Admitting: Internal Medicine

## 2019-05-18 DIAGNOSIS — R0602 Shortness of breath: Secondary | ICD-10-CM | POA: Diagnosis not present

## 2019-05-18 DIAGNOSIS — J4550 Severe persistent asthma, uncomplicated: Secondary | ICD-10-CM

## 2019-05-18 MED ORDER — AZITHROMYCIN 250 MG PO TABS: 250.0000 mg | ORAL_TABLET | Freq: Every day | ORAL | 6 refills | Status: DC

## 2019-05-18 MED ORDER — BUDESONIDE-FORMOTEROL FUMARATE 160-4.5 MCG/ACT IN AERO: 2.0000 | INHALATION_SPRAY | Freq: Two times a day (BID) | RESPIRATORY_TRACT | 6 refills | Status: DC

## 2019-05-18 MED ORDER — ALBUTEROL SULFATE (2.5 MG/3ML) 0.083% IN NEBU: INHALATION_SOLUTION | RESPIRATORY_TRACT | 6 refills | Status: DC

## 2019-05-18 NOTE — Telephone Encounter (Signed)
Left message with pt's daughter to schedule SMW.  06/02/2019 around 3:30 would work best if okay with pt.

## 2019-05-18 NOTE — Progress Notes (Signed)
* [facility]    I connected with the patient by telephone enabled telemedicine visit and verified that I am speaking with the correct person using two identifiers.    I discussed the limitations, risks, security and privacy concerns of performing an evaluation and management service by telemedicine and the availability of in-person appointments. I also discussed with the patient that there may be a patient responsible charge related to this service. The patient expressed understanding and agreed to proceed.  PATIENT AGREES AND CONFIRMS -YES   Other persons participating in the visit and their role in the encounter: Patient, nursing  This visit type was conducted due to national recommendations for restrictions regarding the COVID-19 Pandemic (e.g. social distancing).  This format is felt to be most appropriate for this patient at this time.  All issues noted in this document were discussed and addressed.        Date: 05/18/2019  MRN# [id number] Whitney Fleming Sep 25, 1945  Whitney Fleming is a 73 y.o. old female seen in follow up for chief complaint of dyspnea.   SYNOPSIS Whitney Fleming is a 73 y.o. female  with severe allergic asthma.  She speaks [place] her daughter translates.  She has had 3 admissions to the hospital in 2019 with asthma exacerbations including most recently in September 2019.  She had not had further hospital admissions thus far this year but required 2 courses of prednisone in May 2020 which caused her blood sugar to spike.  She has not been very compliant with her medications, she has a Symbicort inhaler, however at her last visit she noted that it had run out and she had not bothered refilling it subsequently her asthma became worse.  At last visit she was strongly advised to make sure that she uses her Symbicort inhaler daily as instructed, use albuterol inhaler as needed.  She continues to rely on and asked for courses of prednisone, however we have  instructed her that this is only a rescue medication and has several deleterious effects therefore she should be maintained on her regular medications and use prednisone only for exacerbations.  She was asked to continue azithromycin 2 and 50 mg 5 days/week.  She previously had been evaluated for an injectable biologic agent for asthma but declined because she did not want injections.   At last visit she was reminded to use Symbicort inhaler 2 puffs twice daily, in addition to her Xopenex nebulizer.  We previously considered her for Nucala but declined anything that involve injections.  She was therefore maintained on azithromycin 5 times weekly. Patient is noted to have her breathing getting worse, and they were worried that she had to go to ED, she went to see her physician the next day and received prednisone which helped.   She has now started taking symbicort 2 puffs twice per day and rinses per day. She is taking singulair, azithro 5 days per week. She is off the prednisone and doing well with that. She is using rescue inhaler she is using 2 times per day, pulmicort nebs twice per day.    **IgE 12/24/17; IGe 5.  **CXR 11/05/17; changes of chronic bronchitis, right heart enlargement.  **CBC 02/11/18>> absolute eosinophil count 800. **CBC 09/25/17; Abs eos count 300.    CC Follow up ASTHMA  HPI Still wheezing, coughs Chronic SOB and DOE only with exertion not at rest NO SYMPTOMS AT REST Uses Symbicort daily and NEBS daily Not on prednisone  On azithromycin 3 days a  week  Clinical status relayed by daughter   Medication:    Current Outpatient Medications:  .  ACCU-CHEK FASTCLIX LANCETS MISC, Use as directed to check blood glucose twice daily, Disp: 100 each, Rfl: 3 .  albuterol (PROVENTIL) (2.5 MG/3ML) 0.083% nebulizer solution, USE 1 VIAL IN NEBULIZER THREE TIMES DAILY, Disp: 225 mL, Rfl: 0 .  AMBULATORY NON FORMULARY MEDICATION, Medication Name: Aero chamber use as directed.  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  azithromycin (ZITHROMAX) 250 MG tablet, Take 1 tablet (250 mg total) by mouth daily. Take 1 tablet (250 mg) by mouth 5 days a week., Disp: 60 each, Rfl: 0 .  Blood Glucose Monitoring Suppl (ACCU-CHEK GUIDE) w/Device KIT, USE TO TEST BLOOD SUGAR TWICE DAILY, Disp: 1 kit, Rfl: 0 .  budesonide (PULMICORT) 0.5 MG/2ML nebulizer solution, Take 2 mLs (0.5 mg total) by nebulization 2 (two) times daily., Disp: 75 mL, Rfl: 12 .  budesonide-formoterol (SYMBICORT) 160-4.5 MCG/ACT inhaler, Inhale 2 puffs into the lungs 2 (two) times daily. Rinse mouth after use, Disp: 3 Inhaler, Rfl: 3 .  carvedilol (COREG) 12.5 MG tablet, Take 1 tablet (12.5 mg total) by mouth 2 (two) times daily with a meal., Disp: 180 tablet, Rfl: 0 .  empagliflozin (JARDIANCE) 25 MG TABS tablet, Take 25 mg by mouth daily., Disp: 90 tablet, Rfl: 1 .  glucose blood (ACCU-CHEK GUIDE) test strip, Use as instructed to test blood glucose twice daily, Disp: 100 each, Rfl: 12 .  Insulin Glargine (LANTUS SOLOSTAR) 100 UNIT/ML Solostar Pen, Inject 10 Units into the skin daily., Disp: 5 pen, Rfl: 1 .  Insulin Pen Needle (NOVOFINE) 32G X 6 MM MISC, Use as directed to inject insulin daily, Disp: 100 each, Rfl: 3 .  losartan (COZAAR) 50 MG tablet, Take 1 tablet (50 mg total) by mouth daily., Disp: 90 tablet, Rfl: 3 .  metFORMIN (GLUCOPHAGE) 1000 MG tablet, Take 1 tablet (1,000 mg total) by mouth 2 (two) times daily with a meal., Disp: 180 tablet, Rfl: 3 .  rosuvastatin (CRESTOR) 5 MG tablet, Take 1 tablet (5 mg total) by mouth daily., Disp: 90 tablet, Rfl: 3 .  sitaGLIPtin (JANUVIA) 100 MG tablet, Take 1 tablet (100 mg total) by mouth daily., Disp: 90 tablet, Rfl: 1 .  THEO-24 200 MG 24 hr capsule, TAKE 1 CAPSULE BY MOUTH ONCE DAILY, Disp: 90 capsule, Rfl: 2   Allergies:  Patient has no known allergies.   Review of Systems:  Gen:  Denies  fever, sweats, chills weight loss  HEENT: Denies blurred vision, double  vision, ear pain, eye pain, hearing loss, nose bleeds, sore throat Cardiac:  No dizziness, chest pain or heaviness, chest tightness,edema, No JVD Resp:   No cough, -sputum production, +shortness of breath,+wheezing, -hemoptysis,  Gi: Denies swallowing difficulty, stomach pain, nausea or vomiting, diarrhea, constipation, bowel incontinence Gu:  Denies bladder incontinence, burning urine Ext:   Denies Joint pain, stiffness or swelling Skin: Denies  skin rash, easy bruising or bleeding or hives Endoc:  Denies polyuria, polydipsia , polyphagia or weight change Psych:   Denies depression, insomnia or hallucinations  Other:  All other systems negative    Assessment and Plan:  Severe Persistent ASTHMA  Continue Inhalers and NEBS Continue Azithromycin for prevention of COPD exacerbations High risk for complications  DOE and SOB only with exertion ASTHMA typically occurs at rest and with exertion, but patient symptoms occur with exertion only. I believe that she will need cardiac evaluation with stress test as she has  Diabetes as well(CAD equivalent) Check ECHO I will also assess for nocturnal and exertionl hypoxia    COVID-19 EDUCATION: The signs and symptoms of COVID-19 were discussed with the patient and how to seek care for testing.  The importance of social distancing was discussed today. Hand Washing Techniques and avoid touching face was advised.     MEDICATION ADJUSTMENTS/LABS AND TESTS ORDERED: ECHO PENDING Recommend cardiologist for SOB and CAD assessment Stop Theophylline Continue inhalers as prescribed 6MWT  And Overnight pulse oximetry  CURRENT MEDICATIONS REVIEWED AT LENGTH WITH PATIENT TODAY   Patient satisfied with Plan of action and management. All questions answered  Follow up in 6 months  Total Time Spent 25 mins   Maretta Bees Patricia Pesa, M.D.  [facility]  Medical Director [facility] Director [facility]  Cardio-Pulmonary Department

## 2019-05-18 NOTE — Addendum Note (Signed)
Addended by: Maryanna Shape A on: 05/18/2019 09:11 AM   Modules accepted: Orders

## 2019-05-18 NOTE — Patient Instructions (Addendum)
MEDICATION ADJUSTMENTS/LABS AND TESTS ORDERED: ECHO PENDING Recommend cardiologist for SOB and CAD assessment Stop Theophylline Continue inhalers as prescribed 6MWT  And Overnight pulse oximetry

## 2019-05-18 NOTE — Addendum Note (Signed)
Addended by: Maryanna Shape A on: 05/18/2019 02:25 PM   Modules accepted: Orders

## 2019-05-19 NOTE — Telephone Encounter (Signed)
Pt has been scheduled for SMW on 06/02/2019 at 3:30. Pt's daughter is aware and voiced her understanding.

## 2019-05-26 ENCOUNTER — Ambulatory Visit: Payer: Medicaid Other | Admitting: Pulmonary Disease

## 2019-05-28 ENCOUNTER — Ambulatory Visit: Payer: Medicaid Other | Attending: Internal Medicine

## 2019-05-29 ENCOUNTER — Telehealth: Payer: Self-pay | Admitting: Internal Medicine

## 2019-05-29 NOTE — Telephone Encounter (Signed)
Lm for pt's daughter, Sherron Flemings. Recall will need to be placed for smw, as providers schedules are not out for jan/Feb.

## 2019-06-01 ENCOUNTER — Telehealth: Payer: Self-pay | Admitting: Internal Medicine

## 2019-06-01 NOTE — Telephone Encounter (Signed)
ONO reviewed by Dr. Mortimer Fries- no oxygen is needed at night.  Left message for pt.

## 2019-06-02 ENCOUNTER — Ambulatory Visit: Payer: Medicaid Other

## 2019-06-02 NOTE — Telephone Encounter (Signed)
Spoke to pt's daughter, [name](DPR). [name] wished to reschedule SMW until february or march. A recall has been placed, as schedule is not out for feb/march. [name] is aware that a letter will be sent to pt once schedule is out and pt will need to contact our office to schedule.  Nothing further is needed.

## 2019-06-03 NOTE — Telephone Encounter (Signed)
Lm for pt's daughter, Whitney Fleming.

## 2019-06-08 NOTE — Telephone Encounter (Signed)
Spoke to pt's daughter, Sherron Flemings [facility]) and relayed below results.  [name] voiced her understanding and had no further questions. Nothing further is needed.

## 2019-06-09 ENCOUNTER — Other Ambulatory Visit: Payer: Self-pay | Admitting: Internal Medicine

## 2019-06-10 ENCOUNTER — Other Ambulatory Visit: Payer: Self-pay | Admitting: Internal Medicine

## 2019-06-10 DIAGNOSIS — J4550 Severe persistent asthma, uncomplicated: Secondary | ICD-10-CM

## 2019-06-10 NOTE — Telephone Encounter (Addendum)
Spoke to pt's daughter, Sherron Flemings.  [name] is questioning if pt should restart Theo-24, as pt has developed wheezing two days ago.  [name] stated that sx are controlled with albuterol and symbicort.  Denies additional symptoms.   Pt is using albuterol TID and symbicort BID.   DK, please advise. Thanks

## 2019-06-16 MED ORDER — ALBUTEROL SULFATE (2.5 MG/3ML) 0.083% IN NEBU: INHALATION_SOLUTION | RESPIRATORY_TRACT | 6 refills | Status: DC

## 2019-06-16 NOTE — Telephone Encounter (Signed)
Pt daughter, Sherron Flemings [facility]) is aware of below message and voiced her understanding.  Rx for albuterol neb has been sent to preferred pharmacy per [name]'s request. Nothing further is needed.

## 2019-06-16 NOTE — Telephone Encounter (Signed)
I do not recommend THEO at this time

## 2019-07-14 ENCOUNTER — Telehealth: Payer: Self-pay | Admitting: Internal Medicine

## 2019-07-14 NOTE — Telephone Encounter (Signed)
Advised her daughter that per CDC guidelines anyone of 23 is recommended to get the vaccine.  She asked if it was safe due to her wheezing. I asked if the patient had been sick and if the wheezing was something new and the pts daughter stated that she has been feeling well and the wheezing is normal. I let the pts daughter know that we are not administering the vaccine here and advised patient that if they had any further questions to contact the local health department. Pts daughter verbalized her understanding. Nothing further needed.

## 2019-07-14 NOTE — Telephone Encounter (Signed)
Lm for pts daughter to callback.

## 2019-07-23 ENCOUNTER — Ambulatory Visit: Payer: Medicaid Other | Attending: Internal Medicine

## 2019-07-28 ENCOUNTER — Ambulatory Visit: Payer: Medicaid Other

## 2019-08-17 ENCOUNTER — Inpatient Hospital Stay
Admission: EM | Admit: 2019-08-17 | Discharge: 2019-08-19 | DRG: 202 | Disposition: A | Discharge status: Home / Self Care | Payer: Medicaid Other | Attending: Internal Medicine | Admitting: Internal Medicine

## 2019-08-17 ENCOUNTER — Other Ambulatory Visit: Payer: Self-pay

## 2019-08-17 ENCOUNTER — Ambulatory Visit: Payer: Medicaid Other

## 2019-08-17 ENCOUNTER — Emergency Department: Payer: Medicaid Other

## 2019-08-17 ENCOUNTER — Encounter: Payer: Self-pay | Admitting: Emergency Medicine

## 2019-08-17 DIAGNOSIS — Z20822 Contact with and (suspected) exposure to covid-19: Secondary | ICD-10-CM | POA: Diagnosis present

## 2019-08-17 DIAGNOSIS — J4551 Severe persistent asthma with (acute) exacerbation: Principal | ICD-10-CM | POA: Diagnosis present

## 2019-08-17 DIAGNOSIS — E875 Hyperkalemia: Secondary | ICD-10-CM | POA: Diagnosis present

## 2019-08-17 DIAGNOSIS — J9602 Acute respiratory failure with hypercapnia: Secondary | ICD-10-CM | POA: Diagnosis present

## 2019-08-17 DIAGNOSIS — R0602 Shortness of breath: Secondary | ICD-10-CM | POA: Diagnosis not present

## 2019-08-17 DIAGNOSIS — R0603 Acute respiratory distress: Secondary | ICD-10-CM

## 2019-08-17 DIAGNOSIS — E119 Type 2 diabetes mellitus without complications: Secondary | ICD-10-CM | POA: Diagnosis present

## 2019-08-17 DIAGNOSIS — Z79899 Other long term (current) drug therapy: Secondary | ICD-10-CM

## 2019-08-17 DIAGNOSIS — J449 Chronic obstructive pulmonary disease, unspecified: Secondary | ICD-10-CM | POA: Diagnosis present

## 2019-08-17 DIAGNOSIS — Z7951 Long term (current) use of inhaled steroids: Secondary | ICD-10-CM

## 2019-08-17 DIAGNOSIS — Z825 Family history of asthma and other chronic lower respiratory diseases: Secondary | ICD-10-CM

## 2019-08-17 DIAGNOSIS — J9601 Acute respiratory failure with hypoxia: Secondary | ICD-10-CM | POA: Diagnosis present

## 2019-08-17 DIAGNOSIS — J9690 Respiratory failure, unspecified, unspecified whether with hypoxia or hypercapnia: Secondary | ICD-10-CM | POA: Diagnosis present

## 2019-08-17 DIAGNOSIS — Z794 Long term (current) use of insulin: Secondary | ICD-10-CM

## 2019-08-17 DIAGNOSIS — J45901 Unspecified asthma with (acute) exacerbation: Secondary | ICD-10-CM | POA: Diagnosis present

## 2019-08-17 DIAGNOSIS — E1165 Type 2 diabetes mellitus with hyperglycemia: Secondary | ICD-10-CM

## 2019-08-17 DIAGNOSIS — E785 Hyperlipidemia, unspecified: Secondary | ICD-10-CM

## 2019-08-17 DIAGNOSIS — Z8249 Family history of ischemic heart disease and other diseases of the circulatory system: Secondary | ICD-10-CM

## 2019-08-17 DIAGNOSIS — I10 Essential (primary) hypertension: Secondary | ICD-10-CM | POA: Diagnosis present

## 2019-08-17 LAB — CBC WITH DIFFERENTIAL/PLATELET
Abs Immature Granulocytes: 0.08 10*3/uL — ABNORMAL HIGH (ref 0.00–0.07)
Basophils Absolute: 0.1 10*3/uL (ref 0.0–0.1)
Basophils Relative: 1 %
Eosinophils Absolute: 0.7 10*3/uL — ABNORMAL HIGH (ref 0.0–0.5)
Eosinophils Relative: 5 %
HCT: 45.3 % (ref 36.0–46.0)
Hemoglobin: 14.4 g/dL (ref 12.0–15.0)
Immature Granulocytes: 1 %
Lymphocytes Relative: 30 %
Lymphs Abs: 3.8 10*3/uL (ref 0.7–4.0)
MCH: 25.8 pg — ABNORMAL LOW (ref 26.0–34.0)
MCHC: 31.8 g/dL (ref 30.0–36.0)
MCV: 81 fL (ref 80.0–100.0)
Monocytes Absolute: 1.1 10*3/uL — ABNORMAL HIGH (ref 0.1–1.0)
Monocytes Relative: 8 %
Neutro Abs: 7.1 10*3/uL (ref 1.7–7.7)
Neutrophils Relative %: 55 %
Platelets: 255 10*3/uL (ref 150–400)
RBC: 5.59 MIL/uL — ABNORMAL HIGH (ref 3.87–5.11)
RDW: 13.5 % (ref 11.5–15.5)
WBC: 12.9 10*3/uL — ABNORMAL HIGH (ref 4.0–10.5)
nRBC: 0 % (ref 0.0–0.2)

## 2019-08-17 LAB — BLOOD GAS, VENOUS
Acid-base deficit: 5 mmol/L — ABNORMAL HIGH (ref 0.0–2.0)
Bicarbonate: 21.6 mmol/L (ref 20.0–28.0)
O2 Saturation: 80.9 %
Patient temperature: 37
pCO2, Ven: 45 mmHg (ref 44.0–60.0)
pH, Ven: 7.29 (ref 7.250–7.430)
pO2, Ven: 51 mmHg — ABNORMAL HIGH (ref 32.0–45.0)

## 2019-08-17 LAB — BRAIN NATRIURETIC PEPTIDE: B Natriuretic Peptide: 164 pg/mL — ABNORMAL HIGH (ref 0.0–100.0)

## 2019-08-17 LAB — RESPIRATORY PANEL BY RT PCR (FLU A&B, COVID)
Influenza A by PCR: NEGATIVE
Influenza B by PCR: NEGATIVE
SARS Coronavirus 2 by RT PCR: NEGATIVE

## 2019-08-17 LAB — BASIC METABOLIC PANEL
Anion gap: 12 (ref 5–15)
BUN: 29 mg/dL — ABNORMAL HIGH (ref 8–23)
CO2: 20 mmol/L — ABNORMAL LOW (ref 22–32)
Calcium: 9.3 mg/dL (ref 8.9–10.3)
Chloride: 96 mmol/L — ABNORMAL LOW (ref 98–111)
Creatinine, Ser: 1.11 mg/dL — ABNORMAL HIGH (ref 0.44–1.00)
GFR calc Af Amer: 56 mL/min — ABNORMAL LOW (ref >60)
GFR calc non Af Amer: 49 mL/min — ABNORMAL LOW (ref >60)
Glucose, Bld: 164 mg/dL — ABNORMAL HIGH (ref 70–99)
Potassium: 6.3 mmol/L (ref 3.5–5.1)
Sodium: 128 mmol/L — ABNORMAL LOW (ref 135–145)

## 2019-08-17 LAB — TROPONIN I (HIGH SENSITIVITY): Troponin I (High Sensitivity): 6 ng/L (ref <18)

## 2019-08-17 MED ORDER — SODIUM BICARBONATE 8.4 % IV SOLN
50.0000 meq | Freq: Once | INTRAVENOUS | Status: AC
  Administered 2019-08-17: 50 meq via INTRAVENOUS
  Filled 2019-08-17: qty 50

## 2019-08-17 MED ORDER — SODIUM CHLORIDE 0.9 % IV BOLUS
500.0000 mL | Freq: Once | INTRAVENOUS | Status: AC
  Administered 2019-08-17: 500 mL via INTRAVENOUS

## 2019-08-17 MED ORDER — IPRATROPIUM-ALBUTEROL 0.5-2.5 (3) MG/3ML IN SOLN: 9.0000 mL | Freq: Once | RESPIRATORY_TRACT | Status: AC

## 2019-08-17 MED ORDER — METHYLPREDNISOLONE SODIUM SUCC 125 MG IJ SOLR: 125.0000 mg | Freq: Once | INTRAMUSCULAR | Status: AC

## 2019-08-17 MED ORDER — METHYLPREDNISOLONE SODIUM SUCC 125 MG IJ SOLR
INTRAMUSCULAR | Status: AC
  Administered 2019-08-17: 125 mg via INTRAVENOUS
  Filled 2019-08-17: qty 2

## 2019-08-17 MED ORDER — DEXTROSE 50 % IV SOLN
1.0000 | Freq: Once | INTRAVENOUS | Status: AC
  Administered 2019-08-17: 50 mL via INTRAVENOUS
  Filled 2019-08-17: qty 50

## 2019-08-17 MED ORDER — IPRATROPIUM-ALBUTEROL 0.5-2.5 (3) MG/3ML IN SOLN
RESPIRATORY_TRACT | Status: AC
  Administered 2019-08-17: 9 mL via RESPIRATORY_TRACT
  Filled 2019-08-17: qty 9

## 2019-08-17 MED ORDER — INSULIN ASPART 100 UNIT/ML IV SOLN
10.0000 [IU] | Freq: Once | INTRAVENOUS | Status: AC
  Administered 2019-08-17: 10 [IU] via INTRAVENOUS
  Filled 2019-08-17: qty 0.1

## 2019-08-17 MED ORDER — MAGNESIUM SULFATE 2 GM/50ML IV SOLN
2.0000 g | Freq: Once | INTRAVENOUS | Status: AC
  Administered 2019-08-17: 2 g via INTRAVENOUS
  Filled 2019-08-17: qty 50

## 2019-08-17 MED ORDER — CALCIUM GLUCONATE 10 % IV SOLN
1.0000 g | Freq: Once | INTRAVENOUS | Status: AC
  Administered 2019-08-17: 1 g via INTRAVENOUS
  Filled 2019-08-17: qty 10

## 2019-08-17 NOTE — ED Triage Notes (Signed)
Pt to STAT desk via w/c, audible wheezing, tachypnic, use of accessory muscles, and prolonged expiration; pt is non-English speaking and is accomp by daughter who has been screened to assist with evaluation; daughter st pt with asthma attack today unrelieved by inhaler and nebulizer at home; daughter denies any recent illness or other c/o

## 2019-08-17 NOTE — ED Notes (Signed)
Report given to Kim, RN.

## 2019-08-17 NOTE — ED Notes (Signed)
bipap placed by respiratory

## 2019-08-17 NOTE — ED Notes (Signed)
Dr. Charna Archer and respiratory at bedside

## 2019-08-17 NOTE — ED Notes (Signed)
Respiratory informed VBG sent to lab 

## 2019-08-17 NOTE — ED Triage Notes (Signed)
Pt arrives via POV from home for [facility] that started about 30 mins ago per daughter. Daughter reports pt was about to go to bed and started having difficulty breathing. Reports cough for the last few days but no other symptoms. Daughter reports she takes inhalers regularly, states symbicort and not sure of the name of the other

## 2019-08-17 NOTE — H&P (Signed)
History and Physical    Whitney Fleming ZOX:[id number] DOB: Mar 20, 1946 DOA: 08/17/2019  PCP: [name], MD Pulmonology is Dr. Mortimer Fries at [facility] Pulmonology Patient coming from: home  I have personally briefly reviewed patient's old medical records in [place]  Chief Complaint: "asthma attack"  HPI: Whitney Fleming is a 74 y.o. female with a pertinent history of severe allergic asthma, HTN, DM, Cardiomegaly, Hyponatremia who presents to the ED with shortness of breath and concerns of an asthma attack in respiratory distress.  Endorses adherence to her inhalers and has been on azithromycin in the outpatient setting, daughter thinks taking three times weekly perhaps.  Has been doing better on this requiring less ED visits.  This might be her 10th admission the family thinks.Whitney Fleming with pulmonology in the outpatient setting.  This has happenned many times before with  Most of the story is received from the daughter while asking the patient yes or no answers over the biPAP.  They declined a Optometrist after persuasion.    When I see the patient she is fairly sleepy but will awake and answer questions directed from the daughter.  She will follow simple commands.  She is reasonably tired from this event.  Daughter states she tried a new herbal tea and has been eating avocados and kiwis frequently recently.  Denies any history of potassium issue in the past.  This was not postprandial or after a new medicine to suggest anaphylaxis.  Patient was initially tachypneic to 32 but settled down when Bipap was placed in the ED. VBG showed a pH of 7.29, PCO2 of 45, BNP of 164 up from 91 02/11/2018, high sensitivity troponin, wbc of 12.9, hgb 14.4, plt 255, flu and covid negative.   CXR was negative for disease.  Repeat VBG showed Potassium improved to   Review of Systems: As per HPI otherwise 10 point review of systems negative.  Other pertinents as below:  General - denies f/c, recent illness HEENT  - denies any head trauma, no problem swallowing Cardio - denies CP, Resp - had some wheezing and SOB and some cough GI - denies any n/v/d/GI pain GU - daughter denies urinary changes MSK -   Skin - denies new skin lesions Neuro - feels sleepy currently but she can't say why. Psych - denies thoughts of hurting herself.  Past Medical History:  Diagnosis Date  . Anemia    normocytic anemia  . Asthma   . Cardiomegaly   . COPD (chronic obstructive pulmonary disease) ([place])   . Diabetes mellitus without complication ([place])   . Hypertension   . Hyponatremia     Past Surgical History:  Procedure Laterality Date  . TUBAL LIGATION       reports that she has never smoked. She has never used smokeless tobacco. She reports that she does not drink alcohol or use drugs.  No Known Allergies  Family History  Problem Relation Age of Onset  . Tuberculosis Other   . Healthy Mother   . Heart disease Father   . Asthma Father   . Cancer Neg Hx    Prior to Admission medications   Medication Sig Start Date End Date Taking? Authorizing Provider  ACCU-CHEK FASTCLIX LANCETS MISC Use as directed to check blood glucose twice daily 04/18/18   [name], MD  albuterol (PROVENTIL) (2.5 MG/3ML) 0.083% nebulizer solution USE 1 VIAL IN NEBULIZER THREE TIMES DAILY Patient taking differently: Take 2.5 mg by nebulization 3 (three) times daily.  06/16/19   Flora Lipps, MD  AMBULATORY NON FORMULARY MEDICATION Medication Name: Aero chamber use as directed. ZM:O29.476 12/24/17   [name], MD  azithromycin (ZITHROMAX) 250 MG tablet Take 1 tablet (250 mg total) by mouth daily. Take 1 tablet (250 mg) by mouth 5 days a week. 05/18/19   Flora Lipps, MD  Blood Glucose Monitoring Suppl (ACCU-CHEK GUIDE) w/Device KIT USE TO TEST BLOOD SUGAR TWICE DAILY 01/19/19   Bacigalupo, Dionne Bucy, MD  budesonide (PULMICORT) 0.5 MG/2ML nebulizer solution Take 2 mLs (0.5 mg total) by nebulization 2 (two) times  daily. 04/25/18   [name], MD  budesonide-formoterol (SYMBICORT) 160-4.5 MCG/ACT inhaler Inhale 2 puffs into the lungs 2 (two) times daily. Rinse mouth after use 05/18/19   Flora Lipps, MD  carvedilol (COREG) 12.5 MG tablet Take 1 tablet (12.5 mg total) by mouth 2 (two) times daily with a meal. 11/21/18   Bacigalupo, Dionne Bucy, MD  empagliflozin (JARDIANCE) 25 MG TABS tablet Take 25 mg by mouth daily. 11/05/18   [name], MD  glucose blood (ACCU-CHEK GUIDE) test strip Use as instructed to test blood glucose twice daily 02/04/19   [name], MD  Insulin Glargine (LANTUS SOLOSTAR) 100 UNIT/ML Solostar Pen Inject 10 Units into the skin daily. 12/25/18   [name], MD  Insulin Pen Needle (NOVOFINE) 32G X 6 MM MISC Use as directed to inject insulin daily 12/25/18   [name], MD  losartan (COZAAR) 50 MG tablet Take 1 tablet (50 mg total) by mouth daily. 11/05/18   [name], MD  metFORMIN (GLUCOPHAGE) 1000 MG tablet Take 1 tablet (1,000 mg total) by mouth 2 (two) times daily with a meal. 11/05/18   Bacigalupo, Dionne Bucy, MD  rosuvastatin (CRESTOR) 5 MG tablet Take 1 tablet (5 mg total) by mouth daily. 11/05/18   [name], MD  sitaGLIPtin (JANUVIA) 100 MG tablet Take 1 tablet (100 mg total) by mouth daily. 11/05/18   Bacigalupo, Dionne Bucy, MD  THEO-24 200 MG 24 hr capsule TAKE 1 CAPSULE BY MOUTH ONCE DAILY Patient taking differently: Take 200 mg by mouth daily.  05/05/18   [name], MD    Physical Exam: Vitals:   08/17/19 2129 08/17/19 2135 08/17/19 2153 08/17/19 2200  BP: (!) 175/77  (!) 175/77 (!) 127/50  Pulse: 86  83 83  Resp: (!) 24  (!) 26 18  Temp:   97.7 F (36.5 C)   TempSrc:   Axillary   SpO2: 100% 100% 100% 100%  Weight:      Height:        Constitutional: NAD, comfortable, sleepy, obese Eyes: pupils equal and reactive to light, anicteric, without injection ENMT: MMM, throat without exudates or  erythema Neck: normal, supple, no masses, no thyromegaly noted Respiratory: CTAB, nwob, on bipap  Cardiovascular: rrr w/o mrg, warm extremities Abdomen: NBS, NT, soft  Musculoskeletal: moving all 4 extremities, strength grossly intact 5/5 in the UE and LE's,  Skin: no rashes, lesions, ulcers. No induration Neurologic: CN 2-12 grossly intact. Sensation intact Psychiatric: AO appearing, sleepy but mentating appropriately and can follow commands like squeezing fingers and sticking out tongue   Labs on Admission: I have personally reviewed following labs and imaging studies  CBC: Recent Labs  Lab 08/17/19 2125  WBC 12.9*  NEUTROABS 7.1  HGB 14.4  HCT 45.3  MCV 81.0  PLT 546   Basic Metabolic Panel: Recent Labs  Lab 08/17/19 2125  NA 128*  K  6.3*  CL 96*  CO2 20*  GLUCOSE 164*  BUN 29*  CREATININE 1.11*  CALCIUM 9.3   GFR: Estimated Creatinine Clearance: 41.1 mL/min (A) (by C-G formula based on SCr of 1.11 mg/dL (H)). Liver Function Tests: No results for input(s): AST, ALT, ALKPHOS, BILITOT, PROT, ALBUMIN in the last 168 hours. No results for input(s): LIPASE, AMYLASE in the last 168 hours. No results for input(s): AMMONIA in the last 168 hours. Coagulation Profile: No results for input(s): INR, PROTIME in the last 168 hours. Cardiac Enzymes: No results for input(s): CKTOTAL, CKMB, CKMBINDEX, TROPONINI in the last 168 hours. BNP (last 3 results) No results for input(s): PROBNP in the last 8760 hours. HbA1C: No results for input(s): HGBA1C in the last 72 hours. CBG: No results for input(s): GLUCAP in the last 168 hours. Lipid Profile: No results for input(s): CHOL, HDL, LDLCALC, TRIG, CHOLHDL, LDLDIRECT in the last 72 hours. Thyroid Function Tests: No results for input(s): TSH, T4TOTAL, FREET4, T3FREE, THYROIDAB in the last 72 hours. Anemia Panel: No results for input(s): VITAMINB12, FOLATE, FERRITIN, TIBC, IRON, RETICCTPCT in the last 72 hours. Urine analysis:     Component Value Date/Time   COLORURINE STRAW (A) 09/26/2017 0642   APPEARANCEUR CLEAR (A) 09/26/2017 0642   APPEARANCEUR Clear 12/25/2012 0411   LABSPEC 1.006 09/26/2017 0642   LABSPEC 1.006 12/25/2012 0411   PHURINE 5.0 09/26/2017 0642   GLUCOSEU 50 (A) 09/26/2017 0642   GLUCOSEU >=500 12/25/2012 0411   HGBUR NEGATIVE 09/26/2017 0642   BILIRUBINUR Negative 01/14/2018 1211   BILIRUBINUR Negative 12/25/2012 0411   KETONESUR NEGATIVE 09/26/2017 0642   PROTEINUR Negative 01/14/2018 1211   PROTEINUR 30 (A) 09/26/2017 0642   UROBILINOGEN 0.2 01/14/2018 1211   NITRITE Negative 01/14/2018 1211   NITRITE NEGATIVE 09/26/2017 0642   LEUKOCYTESUR Negative 01/14/2018 1211   LEUKOCYTESUR Negative 12/25/2012 0411    Radiological Exams on Admission: DG Chest Portable 1 View  Result Date: 08/17/2019 CLINICAL DATA:  Shortness of breath, wheezing EXAM: PORTABLE CHEST 1 VIEW COMPARISON:  10/10/2018, 02/11/2018 FINDINGS: No consolidation or effusion. Minimal chronic interstitial opacity at the bases. Normal heart size. Aortic atherosclerosis. No pneumothorax. IMPRESSION: No active disease. Electronically Signed   By: Donavan Foil M.D.   On: 08/17/2019 21:56    EKG: Independently reviewed. NSR with unchanged twave flattening in V2  Assessment/Plan Active Problems:   * No active hospital problems. * Whitney Fleming is a 74 y.o. female with a pertinent history of asthma, HTN, DM, Cardiomegaly, Hyponatremia who presents to the ED with shortness of breath and concerns of an asthma attack in respiratory distress.  Has been using her inhalers more today.  An interpretor was used for the majority of the encounter and her daughter helped provide some interpretation as well.  Over the last week patient has been usin gher inhalers more and her shortness of breath had gradually worsened until acute decompensation right before coming to the ED.  Acute Respiratory Failure, improving, I suspect hypercapneic, thought  to be from an Asthma exacerbation ?COPD, although never smoked DDX: HF with BNP of ?160, vocal cord dysfunction, poor inhaler use, ABPA with recurrent consider seeing if pulmonology has assessed for this or other opportunistic, the daughter rpeorts that she has had TB before. Eosinophilia of 700 Asthma - Duonebs q6-8, continue symbicort, consider pulmonary consult.  Please have an educator make sure she is doing appropriate technique. Daughter thinks they stopped theophylline so will hold so we can confirm. Prednisone 51m for 5  days Covid test PCR was negative. Magnesium. Follow up on procalcitonin, awaiting this to decide if will treat with abx. Follow up on UA Consider adding on Azithromycin, per her home regimen, just need to medrec her.  Repeating VBG to ensure relative hypercapnea has improved given she was tachypneic and still had pCO2 of ~45 on vbg.  BNP mildly elevated will consider lasix once infection has been ruled out, consider in the morning.  Severe Hyperkalemia of unclear etiology, highest on differential is from high potassium foods avocados, herbal teas, kiwis.  I advised the daughter to not do this anymore.possibly related to respiratory acidosis _0  Temporized with insulin and stabilized with calcium gluconate at 2300, gave sodium bicarb EKG with peaked Twaves, but on repeat around [place] without peaked t waves so will hold off on continuing calcium gluconate. BMP at 12 or 1am, repeat at 5am. --low potassium diet  Chronic Comorbidites HTN - continue coreg at 12.32m BID, losartan 533mqd HLD - cont statin T2DM - hold orals (metformin and empagliflozin) because of language barrier will hold 10 units of lantus and just do SSI, consider adding back on lantus in the am.  +++ h/o prednisone-induced hyperglycemia so monitor closely. Normocytic Anemia - resolved, hgb 14's  Patient and/or Family completely agreed with the plan, expressed understanding and I answered all  questions.  DVT prophylaxis: Lovenox SQ Code Status: Full code Family Communication: Daughter, MaKileigh [phone] also acts as interpretor Disposition Plan: probably back home Consults called: n/a Admission status: observation because I do not expect she will be here for greater than 2 midnights. I want her in the stepdown unit. (inpatient / obs / tele / medical floor / SDU).     A total of 65 minutes utilized during this admission.  [facility]   If 7PM-7AM, please contact night-coverage [email or web address] Password [email or web address]3/01/2020, 11:19 PM

## 2019-08-17 NOTE — ED Notes (Signed)
Dr. Charna Archer informed of critical K

## 2019-08-17 NOTE — ED Provider Notes (Signed)
[facility] Emergency Department Provider Note   ____________________________________________   First MD Initiated Contact with Patient 08/17/19 2123     (approximate)  I have reviewed the triage vital signs and the nursing notes.   HISTORY  Chief Complaint Shortness of Breath    HPI Whitney Fleming is a 74 y.o. female with possible history of asthma, hypertension, diabetes who presents to the ED for shortness of breath.  History is limited due to respiratory distress and language barrier.  64 of history is provided by patient's daughter.  She states that patient has had increased difficulty breathing over the past couple of days with increased use of her nebulized breathing treatments at home.  Patient then seem to acutely worsen 30 minutes to an hour prior to arrival in the ED.  Daughter states that patient has been struggling to breathe, but she has not complained of any chest pain.  She has not had any recent fevers, but has had a nonproductive cough.  Daughter describes her current symptoms as similar to prior asthma exacerbations and she has had multiple admissions for asthma, but has never been intubated.        Past Medical History:  Diagnosis Date  . Anemia    normocytic anemia  . Asthma   . Cardiomegaly   . COPD (chronic obstructive pulmonary disease) ([place])   . Diabetes mellitus without complication ([place])   . Hypertension   . Hyponatremia     Patient Active Problem List   Diagnosis Date Noted  . COPD (chronic obstructive pulmonary disease) ([place]) 03/03/2018  . Hyperlipidemia associated with type 2 diabetes mellitus ([place]) 12/20/2017  . GERD (gastroesophageal reflux disease) 12/20/2017  . Asthma 09/26/2017  . HTN (hypertension) 09/26/2017  . Diabetes ([place]) 09/26/2017    Past Surgical History:  Procedure Laterality Date  . TUBAL LIGATION      Prior to Admission medications   Medication Sig Start Date End Date Taking? Authorizing  Provider  ACCU-CHEK FASTCLIX LANCETS MISC Use as directed to check blood glucose twice daily 04/18/18   [name], MD  albuterol (PROVENTIL) (2.5 MG/3ML) 0.083% nebulizer solution USE 1 VIAL IN NEBULIZER THREE TIMES DAILY Patient taking differently: Take 2.5 mg by nebulization 3 (three) times daily.  06/16/19   Flora Lipps, MD  AMBULATORY NON FORMULARY MEDICATION Medication Name: Aero chamber use as directed. KC:L27.517 12/24/17   [name], MD  azithromycin (ZITHROMAX) 250 MG tablet Take 1 tablet (250 mg total) by mouth daily. Take 1 tablet (250 mg) by mouth 5 days a week. 05/18/19   Flora Lipps, MD  Blood Glucose Monitoring Suppl (ACCU-CHEK GUIDE) w/Device KIT USE TO TEST BLOOD SUGAR TWICE DAILY 01/19/19   Bacigalupo, Dionne Bucy, MD  budesonide (PULMICORT) 0.5 MG/2ML nebulizer solution Take 2 mLs (0.5 mg total) by nebulization 2 (two) times daily. 04/25/18   [name], MD  budesonide-formoterol (SYMBICORT) 160-4.5 MCG/ACT inhaler Inhale 2 puffs into the lungs 2 (two) times daily. Rinse mouth after use 05/18/19   Flora Lipps, MD  carvedilol (COREG) 12.5 MG tablet Take 1 tablet (12.5 mg total) by mouth 2 (two) times daily with a meal. 11/21/18   Bacigalupo, Dionne Bucy, MD  empagliflozin (JARDIANCE) 25 MG TABS tablet Take 25 mg by mouth daily. 11/05/18   [name], MD  glucose blood (ACCU-CHEK GUIDE) test strip Use as instructed to test blood glucose twice daily 02/04/19   [name], MD  Insulin Glargine (LANTUS SOLOSTAR) 100 UNIT/ML Solostar Pen  Inject 10 Units into the skin daily. 12/25/18   [name], MD  Insulin Pen Needle (NOVOFINE) 32G X 6 MM MISC Use as directed to inject insulin daily 12/25/18   [name], MD  losartan (COZAAR) 50 MG tablet Take 1 tablet (50 mg total) by mouth daily. 11/05/18   [name], MD  metFORMIN (GLUCOPHAGE) 1000 MG tablet Take 1 tablet (1,000 mg total) by mouth 2 (two) times daily with a meal.  11/05/18   Bacigalupo, Dionne Bucy, MD  rosuvastatin (CRESTOR) 5 MG tablet Take 1 tablet (5 mg total) by mouth daily. 11/05/18   [name], MD  sitaGLIPtin (JANUVIA) 100 MG tablet Take 1 tablet (100 mg total) by mouth daily. 11/05/18   Bacigalupo, Dionne Bucy, MD  THEO-24 200 MG 24 hr capsule TAKE 1 CAPSULE BY MOUTH ONCE DAILY Patient taking differently: Take 200 mg by mouth daily.  05/05/18   [name], MD    Allergies Patient has no known allergies.  Family History  Problem Relation Age of Onset  . Tuberculosis Other   . Healthy Mother   . Heart disease Father   . Asthma Father   . Cancer Neg Hx     Social History Social History   Tobacco Use  . Smoking status: Never Smoker  . Smokeless tobacco: Never Used  Substance Use Topics  . Alcohol use: Never  . Drug use: Never    Review of Systems Unable to obtain secondary to respiratory distress  ____________________________________________   PHYSICAL EXAM:  VITAL SIGNS: ED Triage Vitals  Enc Vitals Group     BP 08/17/19 2116 (!) 155/125     Pulse Rate 08/17/19 2116 95     Resp 08/17/19 2116 (!) 32     Temp --      Temp src --      SpO2 08/17/19 2116 93 %     Weight 08/17/19 2121 165 lb (74.8 kg)     Height 08/17/19 2121 '5\' 1"'$  (1.549 m)     Head Circumference --      Peak Flow --      Pain Score --      Pain Loc --      Pain Edu? --      Excl. in [place]? --     Constitutional: Alert and oriented. Eyes: Conjunctivae are normal. Head: Atraumatic. Nose: No congestion/rhinnorhea. Mouth/Throat: Mucous membranes are moist. Neck: Normal ROM Cardiovascular: Tachycardic, regular rhythm. Grossly normal heart sounds. Respiratory: Tachypneic with respiratory distress and accessory muscle use.  Minimal air movement noted throughout. Gastrointestinal: Soft and nontender. No distention. Genitourinary: deferred Musculoskeletal: No lower extremity tenderness nor edema. Neurologic:  Normal speech and language.  No gross focal neurologic deficits are appreciated. Skin:  Skin is warm, dry and intact. No rash noted. Psychiatric: Unable to assess.  ____________________________________________   LABS (all labs ordered are listed, but only abnormal results are displayed)  Labs Reviewed  BASIC METABOLIC PANEL - Abnormal; Notable for the following components:      Result Value   Sodium 128 (*)    Potassium 6.3 (*)    Chloride 96 (*)    CO2 20 (*)    Glucose, Bld 164 (*)    BUN 29 (*)    Creatinine, Ser 1.11 (*)    GFR calc non Af Amer 49 (*)    GFR calc Af Amer 56 (*)    All other components within normal limits  CBC WITH DIFFERENTIAL/PLATELET -  Abnormal; Notable for the following components:   WBC 12.9 (*)    RBC 5.59 (*)    MCH 25.8 (*)    Monocytes Absolute 1.1 (*)    Eosinophils Absolute 0.7 (*)    Abs Immature Granulocytes 0.08 (*)    All other components within normal limits  BRAIN NATRIURETIC PEPTIDE - Abnormal; Notable for the following components:   B Natriuretic Peptide 164.0 (*)    All other components within normal limits  BLOOD GAS, VENOUS - Abnormal; Notable for the following components:   pO2, Ven 51.0 (*)    Acid-base deficit 5.0 (*)    All other components within normal limits  RESPIRATORY PANEL BY RT PCR (FLU A&B, COVID)  BASIC METABOLIC PANEL  TROPONIN I (HIGH SENSITIVITY)  TROPONIN I (HIGH SENSITIVITY)   ____________________________________________  EKG  ED ECG REPORT I, Blake Divine, the attending physician, personally viewed and interpreted this ECG.   Date: 08/17/2019  EKG Time: 21:23  Rate: 94  Rhythm: normal sinus rhythm  Axis: Normal  Intervals:none  ST&T Change: Peaked T waves   PROCEDURES  Procedure(s) performed (including Critical Care):  .Critical Care Performed by: Blake Divine, MD Authorized by: Blake Divine, MD   Critical care provider statement:    Critical care time (minutes):  45   Critical care time was exclusive of:   Separately billable procedures and treating other patients and teaching time   Critical care was necessary to treat or prevent imminent or life-threatening deterioration of the following conditions:  Respiratory failure   Critical care was time spent personally by me on the following activities:  Discussions with consultants, evaluation of patient's response to treatment, examination of patient, ordering and performing treatments and interventions, ordering and review of laboratory studies, ordering and review of radiographic studies, pulse oximetry, re-evaluation of patient's condition, obtaining history from patient or surrogate and review of old charts   I assumed direction of critical care for this patient from another provider in my specialty: no       ____________________________________________   INITIAL IMPRESSION / ASSESSMENT AND PLAN / ED COURSE       74 year old female with history of asthma presents to the ED with increased difficulty breathing over the past couple of days, noted to be in respiratory distress upon arrival.  She was started on BiPAP shortly after arrival and received multiple DuoNeb's via BiPAP.  She was also given IV steroids and IV magnesium.  Her symptoms gradually improved and wheezing became more audible as she began moving more air.  Chest x-ray is negative for acute process, EKG shows no evidence of ischemia but does show what appears to be peaked T waves.  She was noted to have a potassium of 6.3 but no apparent acute change in renal function.  Given peaked T waves, she was given dose of calcium, also treated with insulin and bicarb.  We will plan to recheck BMP to ensure improvement, however her work of breathing does appear much improved and she is tolerating BiPAP without difficulty.  Troponin is within normal limits.  Case discussed with hospitalist, who accepts patient for admission.      ____________________________________________   FINAL CLINICAL  IMPRESSION(S) / ED DIAGNOSES  Final diagnoses:  Respiratory distress  SOB (shortness of breath)  Exacerbation of persistent asthma, unspecified asthma severity     ED Discharge Orders    None       Note:  This document was prepared using Dragon voice recognition software and  may include unintentional dictation errors.   Blake Divine, MD 08/17/19 [phone]

## 2019-08-18 ENCOUNTER — Ambulatory Visit: Payer: Medicaid Other

## 2019-08-18 DIAGNOSIS — E119 Type 2 diabetes mellitus without complications: Secondary | ICD-10-CM | POA: Diagnosis present

## 2019-08-18 DIAGNOSIS — E875 Hyperkalemia: Secondary | ICD-10-CM | POA: Diagnosis present

## 2019-08-18 DIAGNOSIS — Z20822 Contact with and (suspected) exposure to covid-19: Secondary | ICD-10-CM | POA: Diagnosis not present

## 2019-08-18 DIAGNOSIS — I10 Essential (primary) hypertension: Secondary | ICD-10-CM | POA: Diagnosis not present

## 2019-08-18 DIAGNOSIS — J9601 Acute respiratory failure with hypoxia: Secondary | ICD-10-CM

## 2019-08-18 DIAGNOSIS — J45901 Unspecified asthma with (acute) exacerbation: Secondary | ICD-10-CM | POA: Diagnosis not present

## 2019-08-18 DIAGNOSIS — J4551 Severe persistent asthma with (acute) exacerbation: Secondary | ICD-10-CM | POA: Diagnosis present

## 2019-08-18 DIAGNOSIS — Z79899 Other long term (current) drug therapy: Secondary | ICD-10-CM | POA: Diagnosis not present

## 2019-08-18 DIAGNOSIS — J9602 Acute respiratory failure with hypercapnia: Secondary | ICD-10-CM

## 2019-08-18 DIAGNOSIS — R0602 Shortness of breath: Secondary | ICD-10-CM | POA: Diagnosis not present

## 2019-08-18 DIAGNOSIS — Z7951 Long term (current) use of inhaled steroids: Secondary | ICD-10-CM | POA: Diagnosis not present

## 2019-08-18 DIAGNOSIS — Z825 Family history of asthma and other chronic lower respiratory diseases: Secondary | ICD-10-CM | POA: Diagnosis not present

## 2019-08-18 DIAGNOSIS — Z8249 Family history of ischemic heart disease and other diseases of the circulatory system: Secondary | ICD-10-CM | POA: Diagnosis not present

## 2019-08-18 DIAGNOSIS — E785 Hyperlipidemia, unspecified: Secondary | ICD-10-CM | POA: Diagnosis not present

## 2019-08-18 DIAGNOSIS — E1165 Type 2 diabetes mellitus with hyperglycemia: Secondary | ICD-10-CM | POA: Diagnosis not present

## 2019-08-18 DIAGNOSIS — R0603 Acute respiratory distress: Secondary | ICD-10-CM | POA: Diagnosis not present

## 2019-08-18 DIAGNOSIS — Z794 Long term (current) use of insulin: Secondary | ICD-10-CM | POA: Diagnosis not present

## 2019-08-18 DIAGNOSIS — J449 Chronic obstructive pulmonary disease, unspecified: Secondary | ICD-10-CM | POA: Diagnosis present

## 2019-08-18 LAB — URINALYSIS, ROUTINE W REFLEX MICROSCOPIC
Bilirubin Urine: NEGATIVE
Glucose, UA: 500 mg/dL — AB
Hgb urine dipstick: NEGATIVE
Ketones, ur: NEGATIVE mg/dL
Leukocytes,Ua: NEGATIVE
Nitrite: NEGATIVE
Protein, ur: NEGATIVE mg/dL
Specific Gravity, Urine: 1.01 (ref 1.005–1.030)
pH: 5.5 (ref 5.0–8.0)

## 2019-08-18 LAB — BASIC METABOLIC PANEL
Anion gap: 11 (ref 5–15)
Anion gap: 7 (ref 5–15)
BUN: 27 mg/dL — ABNORMAL HIGH (ref 8–23)
BUN: 28 mg/dL — ABNORMAL HIGH (ref 8–23)
CO2: 20 mmol/L — ABNORMAL LOW (ref 22–32)
CO2: 20 mmol/L — ABNORMAL LOW (ref 22–32)
Calcium: 9 mg/dL (ref 8.9–10.3)
Calcium: 9.3 mg/dL (ref 8.9–10.3)
Chloride: 100 mmol/L (ref 98–111)
Chloride: 102 mmol/L (ref 98–111)
Creatinine, Ser: 1.03 mg/dL — ABNORMAL HIGH (ref 0.44–1.00)
Creatinine, Ser: 1.11 mg/dL — ABNORMAL HIGH (ref 0.44–1.00)
GFR calc Af Amer: 56 mL/min — ABNORMAL LOW (ref >60)
GFR calc Af Amer: >60 mL/min (ref >60)
GFR calc non Af Amer: 49 mL/min — ABNORMAL LOW (ref >60)
GFR calc non Af Amer: 53 mL/min — ABNORMAL LOW (ref >60)
Glucose, Bld: 220 mg/dL — ABNORMAL HIGH (ref 70–99)
Glucose, Bld: 277 mg/dL — ABNORMAL HIGH (ref 70–99)
Potassium: 5.4 mmol/L — ABNORMAL HIGH (ref 3.5–5.1)
Potassium: 6.3 mmol/L (ref 3.5–5.1)
Sodium: 129 mmol/L — ABNORMAL LOW (ref 135–145)
Sodium: 131 mmol/L — ABNORMAL LOW (ref 135–145)

## 2019-08-18 LAB — GLUCOSE, CAPILLARY
Glucose-Capillary: 176 mg/dL — ABNORMAL HIGH (ref 70–99)
Glucose-Capillary: 228 mg/dL — ABNORMAL HIGH (ref 70–99)
Glucose-Capillary: 256 mg/dL — ABNORMAL HIGH (ref 70–99)
Glucose-Capillary: 376 mg/dL — ABNORMAL HIGH (ref 70–99)
Glucose-Capillary: 386 mg/dL — ABNORMAL HIGH (ref 70–99)

## 2019-08-18 LAB — CBC
HCT: 41.6 % (ref 36.0–46.0)
Hemoglobin: 13.3 g/dL (ref 12.0–15.0)
MCH: 26 pg (ref 26.0–34.0)
MCHC: 32 g/dL (ref 30.0–36.0)
MCV: 81.4 fL (ref 80.0–100.0)
Platelets: 192 10*3/uL (ref 150–400)
RBC: 5.11 MIL/uL (ref 3.87–5.11)
RDW: 13.5 % (ref 11.5–15.5)
WBC: 7.5 10*3/uL (ref 4.0–10.5)
nRBC: 0 % (ref 0.0–0.2)

## 2019-08-18 LAB — BLOOD GAS, VENOUS
Acid-base deficit: 3.4 mmol/L — ABNORMAL HIGH (ref 0.0–2.0)
Bicarbonate: 23.6 mmol/L (ref 20.0–28.0)
Delivery systems: POSITIVE
FIO2: 0.4
O2 Saturation: 86.7 %
PEEP: 6 cmH2O
Patient temperature: 37
Pressure support: 12 cmH2O
pCO2, Ven: 49 mmHg (ref 44.0–60.0)
pH, Ven: 7.29 (ref 7.250–7.430)
pO2, Ven: 59 mmHg — ABNORMAL HIGH (ref 32.0–45.0)

## 2019-08-18 LAB — HEMOGLOBIN A1C
Hgb A1c MFr Bld: 7.9 % — ABNORMAL HIGH (ref 4.8–5.6)
Mean Plasma Glucose: 180.03 mg/dL

## 2019-08-18 LAB — PROCALCITONIN: Procalcitonin: <0.1 ng/mL

## 2019-08-18 LAB — TROPONIN I (HIGH SENSITIVITY): Troponin I (High Sensitivity): 5 ng/L (ref <18)

## 2019-08-18 LAB — MAGNESIUM: Magnesium: 2.4 mg/dL (ref 1.7–2.4)

## 2019-08-18 MED ORDER — IPRATROPIUM-ALBUTEROL 0.5-2.5 (3) MG/3ML IN SOLN
3.0000 mL | Freq: Four times a day (QID) | RESPIRATORY_TRACT | Status: DC
  Administered 2019-08-18 (×2): 3 mL via RESPIRATORY_TRACT
  Filled 2019-08-18 (×2): qty 3

## 2019-08-18 MED ORDER — LOSARTAN POTASSIUM 50 MG PO TABS: 50.0000 mg | ORAL_TABLET | Freq: Every day | ORAL | Status: DC

## 2019-08-18 MED ORDER — INSULIN ASPART 100 UNIT/ML IV SOLN
10.0000 [IU] | Freq: Once | INTRAVENOUS | Status: AC
  Administered 2019-08-18: 10 [IU] via INTRAVENOUS
  Filled 2019-08-18: qty 0.1

## 2019-08-18 MED ORDER — AEROCHAMBER PLUS FLO-VU MEDIUM MISC
1.0000 | Freq: Once | Status: DC
  Filled 2019-08-18: qty 1

## 2019-08-18 MED ORDER — INSULIN ASPART 100 UNIT/ML ~~LOC~~ SOLN
0.0000 [IU] | Freq: Three times a day (TID) | SUBCUTANEOUS | Status: DC
  Administered 2019-08-18: 15 [IU] via SUBCUTANEOUS
  Administered 2019-08-18: 8 [IU] via SUBCUTANEOUS
  Filled 2019-08-18 (×2): qty 1

## 2019-08-18 MED ORDER — SODIUM POLYSTYRENE SULFONATE 15 GM/60ML PO SUSP
15.0000 g | Freq: Once | ORAL | Status: AC
  Administered 2019-08-18: 15 g via ORAL
  Filled 2019-08-18: qty 60

## 2019-08-18 MED ORDER — SODIUM ZIRCONIUM CYCLOSILICATE 5 G PO PACK
5.0000 g | PACK | Freq: Three times a day (TID) | ORAL | Status: DC
  Administered 2019-08-18 (×3): 5 g via ORAL
  Filled 2019-08-18 (×4): qty 1

## 2019-08-18 MED ORDER — INSULIN GLARGINE 100 UNIT/ML ~~LOC~~ SOLN
10.0000 [IU] | Freq: Every day | SUBCUTANEOUS | Status: DC
  Administered 2019-08-18: 10 [IU] via SUBCUTANEOUS
  Filled 2019-08-18 (×2): qty 0.1

## 2019-08-18 MED ORDER — INSULIN GLARGINE 100 UNIT/ML SOLOSTAR PEN: 10.0000 [IU] | PEN_INJECTOR | Freq: Every day | SUBCUTANEOUS | Status: DC

## 2019-08-18 MED ORDER — CARVEDILOL 12.5 MG PO TABS
12.5000 mg | ORAL_TABLET | Freq: Two times a day (BID) | ORAL | Status: DC
  Administered 2019-08-18 – 2019-08-19 (×3): 12.5 mg via ORAL
  Filled 2019-08-18 (×3): qty 1

## 2019-08-18 MED ORDER — MOMETASONE FURO-FORMOTEROL FUM 200-5 MCG/ACT IN AERO
2.0000 | INHALATION_SPRAY | Freq: Two times a day (BID) | RESPIRATORY_TRACT | Status: DC
  Administered 2019-08-18 – 2019-08-19 (×3): 2 via RESPIRATORY_TRACT
  Filled 2019-08-18: qty 8.8

## 2019-08-18 MED ORDER — SODIUM BICARBONATE 8.4 % IV SOLN: 50.0000 meq | Freq: Once | INTRAVENOUS | Status: DC

## 2019-08-18 MED ORDER — IPRATROPIUM-ALBUTEROL 0.5-2.5 (3) MG/3ML IN SOLN
3.0000 mL | RESPIRATORY_TRACT | Status: DC
  Administered 2019-08-18 (×4): 3 mL via RESPIRATORY_TRACT
  Filled 2019-08-18 (×5): qty 3

## 2019-08-18 MED ORDER — ROSUVASTATIN CALCIUM 5 MG PO TABS
5.0000 mg | ORAL_TABLET | Freq: Every day | ORAL | Status: DC
  Administered 2019-08-18: 5 mg via ORAL
  Filled 2019-08-18: qty 1

## 2019-08-18 MED ORDER — DEXTROSE 50 % IV SOLN
1.0000 | Freq: Once | INTRAVENOUS | Status: AC
  Administered 2019-08-18: 50 mL via INTRAVENOUS
  Filled 2019-08-18: qty 50

## 2019-08-18 MED ORDER — PREDNISONE 20 MG PO TABS
40.0000 mg | ORAL_TABLET | Freq: Every day | ORAL | Status: DC
  Administered 2019-08-18 – 2019-08-19 (×2): 40 mg via ORAL
  Filled 2019-08-18 (×2): qty 2

## 2019-08-18 MED ORDER — ENOXAPARIN SODIUM 40 MG/0.4ML ~~LOC~~ SOLN
40.0000 mg | SUBCUTANEOUS | Status: DC
  Administered 2019-08-18 – 2019-08-19 (×2): 40 mg via SUBCUTANEOUS
  Filled 2019-08-18 (×2): qty 0.4

## 2019-08-18 MED ORDER — CHLORHEXIDINE GLUCONATE CLOTH 2 % EX PADS
6.0000 | MEDICATED_PAD | Freq: Every day | CUTANEOUS | Status: DC
  Administered 2019-08-18: 6 via TOPICAL

## 2019-08-18 MED ORDER — DM-GUAIFENESIN ER 30-600 MG PO TB12
1.0000 | ORAL_TABLET | Freq: Two times a day (BID) | ORAL | Status: DC
  Administered 2019-08-19 (×2): 1 via ORAL
  Filled 2019-08-18 (×2): qty 1

## 2019-08-18 MED ORDER — INSULIN ASPART 100 UNIT/ML ~~LOC~~ SOLN
0.0000 [IU] | Freq: Three times a day (TID) | SUBCUTANEOUS | Status: DC
  Administered 2019-08-18: 20 [IU] via SUBCUTANEOUS
  Administered 2019-08-19: 7 [IU] via SUBCUTANEOUS
  Filled 2019-08-18 (×2): qty 1

## 2019-08-18 NOTE — Progress Notes (Signed)
PROGRESS NOTE    Whitney Fleming  [id number] DOB: Aug 19, 1945 DOA: 08/17/2019 PCP: [name], MD    Assessment & Plan:   Active Problems:   Respiratory failure ([place])   Asthma exacerbation    Whitney Fleming is a 74 y.o. female with a pertinent history of severe allergic asthma, HTN, DM, Cardiomegaly, Hyponatremia who presented to the ED with shortness of breath and concerns of an asthma attack in respiratory distress.       Patient was initially tachypneic to 32 but settled down when Bipap was placed in the ED. VBG showed a pH of 7.29, PCO2 of 45, wbc of 12.9, flu and covid negative.   CXR was negative for disease.  Pt was admitted to stepdown.  # Acute hypoxic and hypercapnic Respiratory Failure 2/2 asthma exacerbation # Frequent hospitalization due to above --Was put on BiPAP and admitted to stepdown, and quickly weaned down to [place] the next day. --reported adherence to her inhalers and has been on azithromycin in the outpatient setting.  This might be her 10th admission the family thinks.Whitney Fleming with pulmonology in the outpatient setting.   PLAN: --wean O2 as tolerated --increase DuoNeb to q4h --continue prednisone 40 mg daily for 5 days --Hold abx as procal neg and no signs of PNA --continue home daily bronchodilators --transfer to the floor today  Severe Hyperkalemia of unclear etiology --K+ 6.3 on presentation.  highest on differential is from high potassium foods avocados, herbal teas, kiwis.   --Temporized with insulin and stabilized with calcium gluconate  PLAN: --continue Lokelma --Add kayexalate --Recheck potassium after BM's. --low potassium diet  HTN  - continue coreg at 12.5mg  BID, losartan 50mg  qd  HLD  - cont statin  T2DM  - hold orals (metformin and empagliflozin)  Continue 10 units of lantus  SSI TID   DVT prophylaxis: Lovenox SQ Code Status: Full code  Family Communication: family updated at bedside today Disposition Plan: transfer to  floor today.  May discharge tomorrow if back on RA.   Subjective and Interval History:  Family interpreted for pt.  Pt reported dyspnea much improved, no more wheezing.  No fever, chest pain, abdominal pain, N/V/D, dysuria, increased swelling.  Eating ok.  No BM yet.  Transfer out of stepdown today.   Objective: Vitals:   08/18/19 1200 08/18/19 1300 08/18/19 1600 08/18/19 2145  BP: (!) 108/59 (!) 121/58    Pulse: (!) 102 82    Resp: (!) 24 (!) 21    Temp:    98.2 F (36.8 C)  TempSrc:    Oral  SpO2: 100% 100% 100%   Weight:      Height:        Intake/Output Summary (Last 24 hours) at 08/19/2019 0343 Last data filed at 08/18/2019 1100 Gross per 24 hour  Intake --  Output 900 ml  Net -900 ml   Filed Weights   08/17/19 2121 08/17/19 2122 08/18/19 0505  Weight: 74.8 kg 77.1 kg 73.9 kg    Examination:   Constitutional: NAD, AAOx3 HEENT: conjunctivae and lids normal, EOMI CV: RRR no M,R,G. Distal pulses +2.  No cyanosis.   RESP: CTA B/L, normal respiratory effort, 2L at 100% GI: +BS, NTND Extremities: No effusions, edema, or tenderness in BLE SKIN: warm, dry and intact Neuro: II - XII grossly intact.  Sensation intact   Data Reviewed: I have personally reviewed following labs and imaging studies  CBC: Recent Labs  Lab 08/17/19 2125 08/18/19 0544  WBC 12.9*  7.5  NEUTROABS 7.1  --   HGB 14.4 13.3  HCT 45.3 41.6  MCV 81.0 81.4  PLT 255 [id number]   Basic Metabolic Panel: Recent Labs  Lab 08/17/19 2125 08/17/19 2349 08/18/19 0543 08/18/19 0544  NA 128* 131*  --  129*  K 6.3* 5.4*  --  6.3*  CL 96* 100  --  102  CO2 20* 20*  --  20*  GLUCOSE 164* 220*  --  277*  BUN 29* 27*  --  28*  CREATININE 1.11* 1.11*  --  1.03*  CALCIUM 9.3 9.0  --  9.3  MG  --   --  2.4  --    GFR: Estimated Creatinine Clearance: 43.4 mL/min (A) (by C-G formula based on SCr of 1.03 mg/dL (H)). Liver Function Tests: No results for input(s): AST, ALT, ALKPHOS, BILITOT, PROT, ALBUMIN  in the last 168 hours. No results for input(s): LIPASE, AMYLASE in the last 168 hours. No results for input(s): AMMONIA in the last 168 hours. Coagulation Profile: No results for input(s): INR, PROTIME in the last 168 hours. Cardiac Enzymes: No results for input(s): CKTOTAL, CKMB, CKMBINDEX, TROPONINI in the last 168 hours. BNP (last 3 results) No results for input(s): PROBNP in the last 8760 hours. HbA1C: Recent Labs    08/18/19 0544  HGBA1C 7.9*   CBG: Recent Labs  Lab 08/18/19 0027 08/18/19 0744 08/18/19 1117 08/18/19 1550 08/18/19 2136  GLUCAP 176* 228* 256* 376* 386*   Lipid Profile: No results for input(s): CHOL, HDL, LDLCALC, TRIG, CHOLHDL, LDLDIRECT in the last 72 hours. Thyroid Function Tests: No results for input(s): TSH, T4TOTAL, FREET4, T3FREE, THYROIDAB in the last 72 hours. Anemia Panel: No results for input(s): VITAMINB12, FOLATE, FERRITIN, TIBC, IRON, RETICCTPCT in the last 72 hours. Sepsis Labs: Recent Labs  Lab 08/17/19 2125  PROCALCITON <0.10    Recent Results (from the past 240 hour(s))  Respiratory Panel by RT PCR (Flu A&B, Covid) - Nasopharyngeal Swab     Status: None   Collection Time: 08/17/19 10:25 PM   Specimen: Nasopharyngeal Swab  Result Value Ref Range Status   SARS Coronavirus 2 by RT PCR NEGATIVE NEGATIVE Final    Comment: (NOTE) SARS-CoV-2 target nucleic acids are NOT DETECTED. The SARS-CoV-2 RNA is generally detectable in upper respiratoy specimens during the acute phase of infection. The lowest concentration of SARS-CoV-2 viral copies this assay can detect is 131 copies/mL. A negative result does not preclude SARS-Cov-2 infection and should not be used as the sole basis for treatment or other patient management decisions. A negative result may occur with  improper specimen collection/handling, submission of specimen other than nasopharyngeal swab, presence of viral mutation(s) within the areas targeted by this assay, and  inadequate number of viral copies (<131 copies/mL). A negative result must be combined with clinical observations, patient history, and epidemiological information. The expected result is Negative. Fact Sheet for Patients:  PinkCheek.be Fact Sheet for Healthcare Providers:  GravelBags.it This test is not yet ap proved or cleared by the [place] FDA and  has been authorized for detection and/or diagnosis of SARS-CoV-2 by FDA under an Emergency Use Authorization (EUA). This EUA will remain  in effect (meaning this test can be used) for the duration of the COVID-19 declaration under Section 564(b)(1) of the Act, 21 U.S.C. section 360bbb-3(b)(1), unless the authorization is terminated or revoked sooner.    Influenza A by PCR NEGATIVE NEGATIVE Final   Influenza B by PCR NEGATIVE NEGATIVE Final  Comment: (NOTE) The Xpert Xpress SARS-CoV-2/FLU/RSV assay is intended as an aid in  the diagnosis of influenza from Nasopharyngeal swab specimens and  should not be used as a sole basis for treatment. Nasal washings and  aspirates are unacceptable for Xpert Xpress SARS-CoV-2/FLU/RSV  testing. Fact Sheet for Patients: PinkCheek.be Fact Sheet for Healthcare Providers: GravelBags.it This test is not yet approved or cleared by the [place] FDA and  has been authorized for detection and/or diagnosis of SARS-CoV-2 by  FDA under an Emergency Use Authorization (EUA). This EUA will remain  in effect (meaning this test can be used) for the duration of the  Covid-19 declaration under Section 564(b)(1) of the Act, 21  U.S.C. section 360bbb-3(b)(1), unless the authorization is  terminated or revoked. Performed at [facility], [address] [place], [place] [address]       Radiology Studies: DG Chest Portable 1 View  Result Date: 08/17/2019 CLINICAL DATA:  Shortness  of breath, wheezing EXAM: PORTABLE CHEST 1 VIEW COMPARISON:  10/10/2018, 02/11/2018 FINDINGS: No consolidation or effusion. Minimal chronic interstitial opacity at the bases. Normal heart size. Aortic atherosclerosis. No pneumothorax. IMPRESSION: No active disease. Electronically Signed   By: Donavan Foil M.D.   On: 08/17/2019 21:56     Scheduled Meds: . carvedilol  12.5 mg Oral BID WC  . Chlorhexidine Gluconate Cloth  6 each Topical Daily  . dextromethorphan-guaiFENesin  1 tablet Oral BID  . enoxaparin (LOVENOX) injection  40 mg Subcutaneous Q24H  . insulin aspart  0-20 Units Subcutaneous TID AC & HS  . insulin glargine  10 Units Subcutaneous Daily  . ipratropium-albuterol  3 mL Nebulization Q4H  . mometasone-formoterol  2 puff Inhalation BID  . predniSONE  40 mg Oral Q breakfast  . rosuvastatin  5 mg Oral Daily  . sodium zirconium cyclosilicate  5 g Oral TID   Continuous Infusions:   LOS: 1 day     Enzo Bi, MD [facility] If 7PM-7AM, please contact night-coverage 08/19/2019, 3:43 AM

## 2019-08-18 NOTE — Progress Notes (Signed)
Critical test result received at 0624 K6.3, Stark Klein, NP notified at [phone]. MD to place orders

## 2019-08-19 ENCOUNTER — Telehealth: Payer: Self-pay | Admitting: Internal Medicine

## 2019-08-19 DIAGNOSIS — E1165 Type 2 diabetes mellitus with hyperglycemia: Secondary | ICD-10-CM

## 2019-08-19 DIAGNOSIS — E785 Hyperlipidemia, unspecified: Secondary | ICD-10-CM

## 2019-08-19 DIAGNOSIS — I10 Essential (primary) hypertension: Secondary | ICD-10-CM

## 2019-08-19 LAB — CBC
HCT: 39.1 % (ref 36.0–46.0)
Hemoglobin: 12.4 g/dL (ref 12.0–15.0)
MCH: 25.6 pg — ABNORMAL LOW (ref 26.0–34.0)
MCHC: 31.7 g/dL (ref 30.0–36.0)
MCV: 80.6 fL (ref 80.0–100.0)
Platelets: 223 10*3/uL (ref 150–400)
RBC: 4.85 MIL/uL (ref 3.87–5.11)
RDW: 13.3 % (ref 11.5–15.5)
WBC: 12.6 10*3/uL — ABNORMAL HIGH (ref 4.0–10.5)
nRBC: 0 % (ref 0.0–0.2)

## 2019-08-19 LAB — BASIC METABOLIC PANEL
Anion gap: 5 (ref 5–15)
BUN: 28 mg/dL — ABNORMAL HIGH (ref 8–23)
CO2: 29 mmol/L (ref 22–32)
Calcium: 8.4 mg/dL — ABNORMAL LOW (ref 8.9–10.3)
Chloride: 99 mmol/L (ref 98–111)
Creatinine, Ser: 1.01 mg/dL — ABNORMAL HIGH (ref 0.44–1.00)
GFR calc Af Amer: >60 mL/min (ref >60)
GFR calc non Af Amer: 54 mL/min — ABNORMAL LOW (ref >60)
Glucose, Bld: 206 mg/dL — ABNORMAL HIGH (ref 70–99)
Potassium: 4.4 mmol/L (ref 3.5–5.1)
Sodium: 133 mmol/L — ABNORMAL LOW (ref 135–145)

## 2019-08-19 LAB — MAGNESIUM: Magnesium: 2.1 mg/dL (ref 1.7–2.4)

## 2019-08-19 LAB — GLUCOSE, CAPILLARY: Glucose-Capillary: 210 mg/dL — ABNORMAL HIGH (ref 70–99)

## 2019-08-19 MED ORDER — ACCU-CHEK GUIDE VI STRP: ORAL_STRIP | 2 refills | Status: DC

## 2019-08-19 MED ORDER — DM-GUAIFENESIN ER 30-600 MG PO TB12: 1.0000 | ORAL_TABLET | Freq: Two times a day (BID) | ORAL | 0 refills | Status: DC

## 2019-08-19 MED ORDER — INSULIN GLARGINE 100 UNIT/ML ~~LOC~~ SOLN: 15.0000 [IU] | Freq: Every day | SUBCUTANEOUS | Status: DC

## 2019-08-19 MED ORDER — ACCU-CHEK FASTCLIX LANCETS MISC: 2 refills | Status: DC

## 2019-08-19 MED ORDER — NOVOFINE 32G X 6 MM MISC: 2 refills | Status: DC

## 2019-08-19 MED ORDER — IPRATROPIUM-ALBUTEROL 0.5-2.5 (3) MG/3ML IN SOLN
3.0000 mL | Freq: Three times a day (TID) | RESPIRATORY_TRACT | Status: DC
  Administered 2019-08-19: 3 mL via RESPIRATORY_TRACT
  Filled 2019-08-19: qty 3

## 2019-08-19 MED ORDER — AEROCHAMBER PLUS FLO-VU LARGE MISC
1.0000 | Freq: Once | Status: AC
  Administered 2019-08-19: 1
  Filled 2019-08-19 (×3): qty 1

## 2019-08-19 MED ORDER — LOSARTAN POTASSIUM 50 MG PO TABS
50.0000 mg | ORAL_TABLET | Freq: Every day | ORAL | Status: DC
  Administered 2019-08-19: 50 mg via ORAL
  Filled 2019-08-19: qty 1

## 2019-08-19 MED ORDER — METFORMIN HCL 500 MG PO TABS
1000.0000 mg | ORAL_TABLET | Freq: Two times a day (BID) | ORAL | Status: DC
  Administered 2019-08-19: 1000 mg via ORAL
  Filled 2019-08-19: qty 2

## 2019-08-19 MED ORDER — PREDNISONE 10 MG PO TABS: 30.0000 mg | ORAL_TABLET | Freq: Every day | ORAL | 0 refills | Status: DC

## 2019-08-19 MED ORDER — INSULIN GLARGINE 100 UNIT/ML ~~LOC~~ SOLN
15.0000 [IU] | Freq: Every day | SUBCUTANEOUS | Status: DC
  Administered 2019-08-19: 15 [IU] via SUBCUTANEOUS
  Filled 2019-08-19: qty 0.15

## 2019-08-19 NOTE — Progress Notes (Signed)
RN went over medications and discharge instructions with daughter and grandson in room. Both family members verbalized understanding. Additional notes written on medication list per family member request.

## 2019-08-19 NOTE — Progress Notes (Signed)
Pt's family member on video chat interpreted for RN and patient during assessment.

## 2019-08-19 NOTE — Telephone Encounter (Signed)
Received call from [facility], who is requesting one week f/u with Dr. Mortimer Fries for asthma flare and respiratory distress.  We do not have any availability with Dr. Mortimer Fries or NP's until April. First available with Dr. Mortimer Fries is not until 09/29/2019.  Dr. Mortimer Fries please advise. Thanks.

## 2019-08-19 NOTE — Discharge Instructions (Signed)
Use your inhalers an check your sugars as before

## 2019-08-19 NOTE — Telephone Encounter (Signed)
09/29/18 is ok

## 2019-08-19 NOTE — Telephone Encounter (Signed)
Pt has been scheduled for 09/29/19 with Dr. Mortimer Fries. Pt's daughter, [name]) is aware and voiced her understanding. Nothing further is needed.

## 2019-08-19 NOTE — Discharge Summary (Signed)
[place] [facility] [place] NAME: Whitney Fleming    MR#:  [id number]  DATE OF BIRTH:  [birth year]  DATE OF ADMISSION:  08/17/2019 ADMITTING PHYSICIAN: Enzo Bi, MD  DATE OF DISCHARGE: 08/19/2019  PRIMARY CARE PHYSICIAN: [name], MD    ADMISSION DIAGNOSIS:  Respiratory distress [R06.03] SOB (shortness of breath) [R06.02] Asthma exacerbation [J45.901] Exacerbation of persistent asthma, unspecified asthma severity [J45.901]  DISCHARGE DIAGNOSIS:  Acute exacerbation of asthma, Persistent  SECONDARY DIAGNOSIS:   Past Medical History:  Diagnosis Date  . Anemia    normocytic anemia  . Asthma   . Cardiomegaly   . COPD (chronic obstructive pulmonary disease) ([place])   . Diabetes mellitus without complication ([place])   . Hypertension   . Hyponatremia     HOSPITAL COURSE:  Whitney Fleming a 74 y.o.femalewith a pertinent history ofsevere allergicasthma, HTN, DM, Cardiomegaly, Hyponatremiawho presented to the ED withshortness of breath and concerns of an asthma attack in respiratory distress.  # Acute hypoxic and hypercapnic Respiratory Failure 2/2 asthma exacerbation--now improved --Was placed on BiPAP and admitted to stepdown, and quickly weaned down to [place] the next day. -pt is 100% on RA, no wheezing, feels back to baseline, able to talk full sentences w/o difficulty --reported adherence to her inhalers and has been on azithromycin (MWF) in the outpatient setting.   -Follows with pulmonology in the outpatient setting. Saw dr Mortimer Fries dec 2020 --continue prednisone 40 mg with tapering dose from tomorrow --Hold abx as procal neg and no signs of PNA --continue home daily bronchodilators -- patient ambulated with RN without difficulty. -Her Theophyllline has been held since July 2020 (likely drug-drug interaction with Azithromycin)  Severe Hyperkalemia of unclear etiology --K+ 6.3 on presentation.  highest on differential is from high  potassium foods avocados, herbal teas, kiwis.  --Temporized with insulin and stabilized with calcium gluconate  -K down to 4.4. d/w pt and dter in the room regarding foods high in K and eating in proportion  HTN  - continue coreg at 12.'5mg'$  BID, losartan '50mg'$  qd  HLD  - cont statin  T2DM  - resume metformin, Sigaptilin and empagliflozin -Continue 10 units of lantus  -SSI TID -a1c 9.1%   DVT prophylaxis: Lovenox SQ Code Status: Full code  Family Communication: family (dter Manisha) updated at bedside today Disposition Plan: d/c home today  Pt and family agreeable  CONSULTS OBTAINED:    DRUG ALLERGIES:  No Known Allergies  DISCHARGE MEDICATIONS:   Allergies as of 08/19/2019   No Known Allergies     Medication List    TAKE these medications   Accu-Chek FastClix Lancets Misc Use as directed to check blood glucose twice daily   Accu-Chek Guide test strip Generic drug: glucose blood Use as instructed to test blood glucose twice daily   Accu-Chek Guide w/Device Kit USE TO TEST BLOOD SUGAR TWICE DAILY   albuterol (2.5 MG/3ML) 0.083% nebulizer solution Commonly known as: PROVENTIL USE 1 VIAL IN NEBULIZER THREE TIMES DAILY What changed:   how much to take  how to take this  when to take this  additional instructions   AMBULATORY NON FORMULARY MEDICATION Medication Name: Aero chamber use as directed. DX:J45.909   budesonide-formoterol 160-4.5 MCG/ACT inhaler Commonly known as: Symbicort Inhale 2 puffs into the lungs 2 (two) times daily. Rinse mouth after use   carvedilol 12.5 MG tablet Commonly known as: COREG Take 1 tablet (12.5 mg total) by mouth 2 (two) times daily with a  meal.   dextromethorphan-guaiFENesin 30-600 MG 12hr tablet Commonly known as: MUCINEX DM Take 1 tablet by mouth 2 (two) times daily.   empagliflozin 25 MG Tabs tablet Commonly known as: Jardiance Take 25 mg by mouth daily.   Lantus SoloStar 100 UNIT/ML Solostar  Pen Generic drug: insulin glargine Inject 10 Units into the skin daily.   losartan 50 MG tablet Commonly known as: COZAAR Take 1 tablet (50 mg total) by mouth daily.   metFORMIN 1000 MG tablet Commonly known as: GLUCOPHAGE Take 1 tablet (1,000 mg total) by mouth 2 (two) times daily with a meal.   montelukast 10 MG tablet Commonly known as: SINGULAIR Take 10 mg by mouth at bedtime.   NovoFine 32G X 6 MM Misc Generic drug: Insulin Pen Needle Use as directed to inject insulin daily   predniSONE 10 MG tablet Commonly known as: DELTASONE Take 3 tablets (30 mg total) by mouth daily with breakfast. Take 30 mg daily taper by 10 mg daily then stop Start taking on: August 20, 2019   rosuvastatin 5 MG tablet Commonly known as: CRESTOR Take 1 tablet (5 mg total) by mouth daily.   sitaGLIPtin 100 MG tablet Commonly known as: Januvia Take 1 tablet (100 mg total) by mouth daily.       If you experience worsening of your admission symptoms, develop shortness of breath, life threatening emergency, suicidal or homicidal thoughts you must seek medical attention immediately by calling 911 or calling your MD immediately  if symptoms less severe.  You Must read complete instructions/literature along with all the possible adverse reactions/side effects for all the Medicines you take and that have been prescribed to you. Take any new Medicines after you have completely understood and accept all the possible adverse reactions/side effects.   Please note  You were cared for by a hospitalist during your hospital stay. If you have any questions about your discharge medications or the care you received while you were in the hospital after you are discharged, you can call the unit and asked to speak with the hospitalist on call if the hospitalist that took care of you is not available. Once you are discharged, your primary care physician will handle any further medical issues. Please note that NO REFILLS  for any discharge medications will be authorized once you are discharged, as it is imperative that you return to your primary care physician (or establish a relationship with a primary care physician if you do not have one) for your aftercare needs so that they can reassess your need for medications and monitor your lab values. Today   SUBJECTIVE   Breathing better. Eating ok. No new complaints  VITAL SIGNS:  Blood pressure (!) 130/50, pulse 65, temperature 98 F (36.7 C), temperature source Oral, resp. rate 17, height '5\' 1"'$  (1.549 m), weight 73.9 kg, SpO2 97 %.  I/O:    Intake/Output Summary (Last 24 hours) at 08/19/2019 1120 Last data filed at 08/19/2019 1009 Gross per 24 hour  Intake 120 ml  Output 450 ml  Net -330 ml    PHYSICAL EXAMINATION:  GENERAL:  74 y.o.-year-old patient lying in the bed with no acute distress.  EYES: Pupils equal, round, reactive to light and accommodation. No scleral icterus.  HEENT: Head atraumatic, normocephalic. Oropharynx and nasopharynx clear.  NECK:  Supple, no jugular venous distention. No thyroid enlargement, no tenderness.  LUNGS: Normal breath sounds bilaterally, no wheezing, rales,rhonchi or crepitation. No use of accessory muscles of respiration.  CARDIOVASCULAR: S1,  S2 normal. No murmurs, rubs, or gallops.  ABDOMEN: Soft, non-tender, non-distended. Bowel sounds present. No organomegaly or mass.  EXTREMITIES: No pedal edema, cyanosis, or clubbing.  NEUROLOGIC: Cranial nerves II through XII are intact. Muscle strength 5/5 in all extremities. Sensation intact. Gait not checked.  PSYCHIATRIC: The patient is alert and oriented x 3.  SKIN: No obvious rash, lesion, or ulcer.   DATA REVIEW:   CBC  Recent Labs  Lab 08/19/19 0328  WBC 12.6*  HGB 12.4  HCT 39.1  PLT 223    Chemistries  Recent Labs  Lab 08/19/19 0328  NA 133*  K 4.4  CL 99  CO2 29  GLUCOSE 206*  BUN 28*  CREATININE 1.01*  CALCIUM 8.4*  MG 2.1    Microbiology  Results   Recent Results (from the past 240 hour(s))  Respiratory Panel by RT PCR (Flu A&B, Covid) - Nasopharyngeal Swab     Status: None   Collection Time: 08/17/19 10:25 PM   Specimen: Nasopharyngeal Swab  Result Value Ref Range Status   SARS Coronavirus 2 by RT PCR NEGATIVE NEGATIVE Final    Comment: (NOTE) SARS-CoV-2 target nucleic acids are NOT DETECTED. The SARS-CoV-2 RNA is generally detectable in upper respiratoy specimens during the acute phase of infection. The lowest concentration of SARS-CoV-2 viral copies this assay can detect is 131 copies/mL. A negative result does not preclude SARS-Cov-2 infection and should not be used as the sole basis for treatment or other patient management decisions. A negative result may occur with  improper specimen collection/handling, submission of specimen other than nasopharyngeal swab, presence of viral mutation(s) within the areas targeted by this assay, and inadequate number of viral copies (<131 copies/mL). A negative result must be combined with clinical observations, patient history, and epidemiological information. The expected result is Negative. Fact Sheet for Patients:  PinkCheek.be Fact Sheet for Healthcare Providers:  GravelBags.it This test is not yet ap proved or cleared by the [place] FDA and  has been authorized for detection and/or diagnosis of SARS-CoV-2 by FDA under an Emergency Use Authorization (EUA). This EUA will remain  in effect (meaning this test can be used) for the duration of the COVID-19 declaration under Section 564(b)(1) of the Act, 21 U.S.C. section 360bbb-3(b)(1), unless the authorization is terminated or revoked sooner.    Influenza A by PCR NEGATIVE NEGATIVE Final   Influenza B by PCR NEGATIVE NEGATIVE Final    Comment: (NOTE) The Xpert Xpress SARS-CoV-2/FLU/RSV assay is intended as an aid in  the diagnosis of influenza from  Nasopharyngeal swab specimens and  should not be used as a sole basis for treatment. Nasal washings and  aspirates are unacceptable for Xpert Xpress SARS-CoV-2/FLU/RSV  testing. Fact Sheet for Patients: PinkCheek.be Fact Sheet for Healthcare Providers: GravelBags.it This test is not yet approved or cleared by the [place] FDA and  has been authorized for detection and/or diagnosis of SARS-CoV-2 by  FDA under an Emergency Use Authorization (EUA). This EUA will remain  in effect (meaning this test can be used) for the duration of the  Covid-19 declaration under Section 564(b)(1) of the Act, 21  U.S.C. section 360bbb-3(b)(1), unless the authorization is  terminated or revoked. Performed at [facility], [address] [place], [place] [address]     RADIOLOGY:  DG Chest Portable 1 View  Result Date: 08/17/2019 CLINICAL DATA:  Shortness of breath, wheezing EXAM: PORTABLE CHEST 1 VIEW COMPARISON:  10/10/2018, 02/11/2018 FINDINGS: No consolidation or effusion. Minimal chronic  interstitial opacity at the bases. Normal heart size. Aortic atherosclerosis. No pneumothorax. IMPRESSION: No active disease. Electronically Signed   By: Donavan Foil M.D.   On: 08/17/2019 21:56     CODE STATUS:     Code Status Orders  (From admission, onward)         Start     Ordered   08/18/19 0149  Full code  Continuous     08/18/19 0148        Code Status History    Date Active Date Inactive Code Status Order ID Comments User Context   02/11/2018 0445 02/12/2018 1910 Full Code [id number]  Arta Silence, MD Inpatient   12/02/2017 2312 12/05/2017 1753 Full Code [id number]  Amelia Jo, MD ED   09/26/2017 0131 09/29/2017 1501 Full Code [id number]  Lance Coon, MD Inpatient   Advance Care Planning Activity       TOTAL TIME TAKING CARE OF THIS PATIENT: *35* minutes.    Fritzi Mandes M.D  [facility]    CC: Primary  care physician; [name], MD

## 2019-08-20 ENCOUNTER — Telehealth: Payer: Self-pay

## 2019-08-20 DIAGNOSIS — E1165 Type 2 diabetes mellitus with hyperglycemia: Secondary | ICD-10-CM

## 2019-08-20 NOTE — Telephone Encounter (Signed)
I have made the 1st attempt to contact the patient or family member in charge, in order to follow up from recently being discharged from the hospital. I left a message on voicemail requesting a CB. -MM 

## 2019-08-20 NOTE — Telephone Encounter (Signed)
Daughter called back and LM stating that she received my VM and stated pt was doing well since her d/c from the hospital.   LMTCB to complete the TCM call prior to [place] apt.

## 2019-08-20 NOTE — Telephone Encounter (Signed)
HFU scheduled for 08/26/19 @ 10:20 AM with Dr Brita Romp.

## 2019-08-21 NOTE — Telephone Encounter (Signed)
Pt's daughter called back.

## 2019-08-24 MED ORDER — LANTUS SOLOSTAR 100 UNIT/ML ~~LOC~~ SOPN: 10.0000 [IU] | PEN_INJECTOR | Freq: Every day | SUBCUTANEOUS | 1 refills | Status: DC

## 2019-08-24 NOTE — Telephone Encounter (Signed)
Transition Care Management Follow-up Telephone Call  Date of discharge and from where: [facility] on 08/19/19  How have you been since you were released from the hospital? Doing well while on the prednisone. Daughter declines SOB, pain, weakness, fever or n/v/d.  Any questions or concerns? Yes, concerned about diet because Prednisone has elevated BS. Daughter to discuss further a HFU apt. Daughter is also interested in getting a Handicap placard for pt.   Items Reviewed:  Did the pt receive and understand the discharge instructions provided? Yes   Medications obtained and verified? No, declined reviewing at this time. Will review at [place] apt.  Any new allergies since your discharge? No   Dietary orders reviewed? Yes  Do you have support at home? Yes   Other (ie: DME, Home Health, etc) N/A  Functional Questionnaire: (I = Independent and D = Dependent)  Bathing/Dressing- I   Meal Prep- I  Eating- I  Maintaining continence- D, wears depends at all times.  Transferring/Ambulation- I, uses a walker as needed but not at all times.  Managing Meds- D, daughter manages medications.   Follow up appointments reviewed:    PCP Hospital f/u appt confirmed? Yes  Scheduled to see Dr Brita Romp on 08/26/19 @ 10:20 AM.  [facility] f/u appt confirmed? Yes    Are transportation arrangements needed? No   If their condition worsens, is the pt aware to call  their PCP or go to the ED? Yes  Was the patient provided with contact information for the PCP's office or ED? Yes  Was the pt encouraged to call back with questions or concerns? Yes

## 2019-08-24 NOTE — Telephone Encounter (Signed)
Lantus refilled DME incontinence supplies need to go through a DME company typically.  Will need to determine which she would like to use.

## 2019-08-25 NOTE — Telephone Encounter (Signed)
Patient's daughter aware that medicaid will not pay for depends from [facility]. I did call [facility] medical supply store. T [phone] Fax [phone]. We do need to send prescription to [facility] with Dx, and diaper size. Patient wears x-large.

## 2019-08-26 ENCOUNTER — Ambulatory Visit (INDEPENDENT_AMBULATORY_CARE_PROVIDER_SITE_OTHER): Payer: Medicaid Other | Admitting: Family Medicine

## 2019-08-26 ENCOUNTER — Other Ambulatory Visit: Payer: Self-pay

## 2019-08-26 ENCOUNTER — Encounter: Payer: Self-pay | Admitting: Family Medicine

## 2019-08-26 VITALS — BP 111/50 | HR 75 | Temp 96.8°F | Resp 16 | Wt 160.0 lb

## 2019-08-26 DIAGNOSIS — J4550 Severe persistent asthma, uncomplicated: Secondary | ICD-10-CM | POA: Diagnosis not present

## 2019-08-26 DIAGNOSIS — N3946 Mixed incontinence: Secondary | ICD-10-CM

## 2019-08-26 DIAGNOSIS — E875 Hyperkalemia: Secondary | ICD-10-CM

## 2019-08-26 NOTE — Progress Notes (Signed)
Patient: Whitney Fleming Female    DOB: [birth year]   74 y.o.   MRN: [id number] Visit Date: 08/26/2019  Today's Provider: Lavon Paganini, MD   Chief Complaint  Patient presents with  . Follow-up   Subjective:     HPI  Follow up Hospitalization  Patient was admitted to [facility] on 08/17/2019 and discharged on 08/19/2019. She was treated for shortness of breath, and acute exacerbation of asthma. Treatment for this included BiPAP, inhalers, azithromycin, and prednisone. Follow up with pulmonology. Telephone follow up was done on 08/20/2019 She reports excellent compliance with treatment. She reports this condition is Improved.  Finished prednisone taper.  Was taking Lantus while she was taking prednisone to avoid hyperglycemia.  Back on RA and feelign respiratory status is back to baseline  Cotninue to take home bronchodialtors  Does wear adult incontinence diapers for her mixed urinary incontinence.  Prescription was sent today to medical supply company to have these delivered to her home  ------------------------------------------------------------------------------------    No Known Allergies   Current Outpatient Medications:  .  Accu-Chek FastClix Lancets MISC, Use as directed to check blood glucose twice daily, Disp: 100 each, Rfl: 2 .  albuterol (PROVENTIL) (2.5 MG/3ML) 0.083% nebulizer solution, USE 1 VIAL IN NEBULIZER THREE TIMES DAILY (Patient taking differently: Take 2.5 mg by nebulization 3 (three) times daily. ), Disp: 225 mL, Rfl: 6 .  budesonide-formoterol (SYMBICORT) 160-4.5 MCG/ACT inhaler, Inhale 2 puffs into the lungs 2 (two) times daily. Rinse mouth after use, Disp: 3 Inhaler, Rfl: 6 .  carvedilol (COREG) 12.5 MG tablet, Take 1 tablet (12.5 mg total) by mouth 2 (two) times daily with a meal., Disp: 180 tablet, Rfl: 0 .  dextromethorphan-guaiFENesin (MUCINEX DM) 30-600 MG 12hr tablet, Take 1 tablet by mouth 2 (two) times daily., Disp: 15 tablet, Rfl: 0 .   empagliflozin (JARDIANCE) 25 MG TABS tablet, Take 25 mg by mouth daily., Disp: 90 tablet, Rfl: 1 .  insulin glargine (LANTUS SOLOSTAR) 100 UNIT/ML Solostar Pen, Inject 10 Units into the skin daily., Disp: 5 pen, Rfl: 1 .  losartan (COZAAR) 50 MG tablet, Take 1 tablet (50 mg total) by mouth daily., Disp: 90 tablet, Rfl: 3 .  metFORMIN (GLUCOPHAGE) 1000 MG tablet, Take 1 tablet (1,000 mg total) by mouth 2 (two) times daily with a meal., Disp: 180 tablet, Rfl: 3 .  montelukast (SINGULAIR) 10 MG tablet, Take 10 mg by mouth at bedtime., Disp: , Rfl:  .  rosuvastatin (CRESTOR) 5 MG tablet, Take 1 tablet (5 mg total) by mouth daily., Disp: 90 tablet, Rfl: 3 .  sitaGLIPtin (JANUVIA) 100 MG tablet, Take 1 tablet (100 mg total) by mouth daily., Disp: 90 tablet, Rfl: 1 .  AMBULATORY NON FORMULARY MEDICATION, Medication Name: Aero chamber use as directed. 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  Blood Glucose Monitoring Suppl (ACCU-CHEK GUIDE) w/Device KIT, USE TO TEST BLOOD SUGAR TWICE DAILY, Disp: 1 kit, Rfl: 0 .  glucose blood (ACCU-CHEK GUIDE) test strip, Use as instructed to test blood glucose twice daily, Disp: 100 each, Rfl: 2 .  Insulin Pen Needle (NOVOFINE) 32G X 6 MM MISC, Use as directed to inject insulin daily, Disp: 100 each, Rfl: 2 .  predniSONE (DELTASONE) 10 MG tablet, Take 3 tablets (30 mg total) by mouth daily with breakfast. Take 30 mg daily taper by 10 mg daily then stop (Patient not taking: Reported on 08/24/2019), Disp: 6 tablet, Rfl: 0  Review of Systems  Constitutional: Negative.  Respiratory: Positive for cough and wheezing. Negative for shortness of breath.   Cardiovascular: Negative.     Social History   Tobacco Use  . Smoking status: Never Smoker  . Smokeless tobacco: Never Used  Substance Use Topics  . Alcohol use: Never      Objective:   BP (!) 111/50 (BP Location: Left Arm, Patient Position: Sitting, Cuff Size: Large)   Pulse 75   Temp (!) 96.8 F (36 C) (Temporal)    Resp 16   Wt 160 lb (72.6 kg)   SpO2 99%   BMI 30.23 kg/m  Vitals:   08/26/19 1040  BP: (!) 111/50  Pulse: 75  Resp: 16  Temp: (!) 96.8 F (36 C)  TempSrc: Temporal  SpO2: 99%  Weight: 160 lb (72.6 kg)  Body mass index is 30.23 kg/m.   Physical Exam Vitals reviewed.  Constitutional:      General: She is not in acute distress.    Appearance: Normal appearance. She is well-developed. She is not diaphoretic.  HENT:     Head: Normocephalic and atraumatic.  Eyes:     General: No scleral icterus.    Conjunctiva/sclera: Conjunctivae normal.  Neck:     Thyroid: No thyromegaly.  Cardiovascular:     Rate and Rhythm: Normal rate and regular rhythm.     Pulses: Normal pulses.     Heart sounds: Normal heart sounds. No murmur.  Pulmonary:     Effort: Pulmonary effort is normal. No respiratory distress.     Breath sounds: Normal breath sounds. No wheezing, rhonchi or rales.  Musculoskeletal:     Cervical back: Neck supple.     Right lower leg: No edema.     Left lower leg: No edema.  Lymphadenopathy:     Cervical: No cervical adenopathy.  Skin:    General: Skin is warm and dry.     Findings: No rash.  Neurological:     Mental Status: She is alert. Mental status is at baseline.  Psychiatric:        Mood and Affect: Mood normal.        Behavior: Behavior normal.      No results found for any visits on 08/26/19.     Assessment & Plan    1. Severe persistent asthma without complication -Chronic and severe asthma with recent exacerbation requiring hospitalization with BiPAP in the stepdown unit -Now stable on room air -Lung exam is actually benign today -Continue home medications -Continue to follow with pulmonology -Advised patient's daughter that she can get a pulse oximeter to have at home to monitor as well -We discussed emergent/return precautions  2. Hyperkalemia -Incidentally noted during hospitalization -May have been related to hypoxia or respiratory  distress -We will recheck renal function panel to ensure that it has remained normal since discharge -No need to change diet at this time, but that does depend on result - Renal Function Panel  3. Urinary incontinence, mixed -Chronic and stable -Needs incontinence supplies-Rx sent   Return in about 3 months (around 11/26/2019) for chronic disease f/u. - needs repeat A1c   The entirety of the information documented in the History of Present Illness, Review of Systems and Physical Exam were personally obtained by me. Portions of this information were initially documented by Lynford Humphrey, CMA and reviewed by me for thoroughness and accuracy.    Thomes Burak, Dionne Bucy, MD MPH [facility]

## 2019-08-26 NOTE — Patient Instructions (Signed)
http://www.aaaai.org/conditions-and-treatments/asthma">  Asthma, Adult  Asthma is a long-term (chronic) condition that causes recurrent episodes in which the airways become tight and narrow. The airways are the passages that lead from the nose and mouth down into the lungs. Asthma episodes, also called asthma attacks, can cause coughing, wheezing, shortness of breath, and chest pain. The airways can also fill with mucus. During an attack, it can be difficult to breathe. Asthma attacks can range from minor to life threatening. Asthma cannot be cured, but medicines and lifestyle changes can help control it and treat acute attacks. What are the causes? This condition is believed to be caused by inherited (genetic) and environmental factors, but its exact cause is not known. There are many things that can bring on an asthma attack or make asthma symptoms worse (triggers). Asthma triggers are different for each person. Common triggers include:  Mold.  Dust.  Cigarette smoke.  Cockroaches.  Things that can cause allergy symptoms (allergens), such as animal dander or pollen from trees or grass.  Air pollutants such as household cleaners, wood smoke, smog, or chemical odors.  Cold air, weather changes, and winds (which increase molds and pollen in the air).  Strong emotional expressions such as crying or laughing hard.  Stress.  Certain medicines (such as aspirin) or types of medicines (such as beta-blockers).  Sulfites in foods and drinks. Foods and drinks that may contain sulfites include dried fruit, potato chips, and sparkling grape juice.  Infections or inflammatory conditions such as the flu, a cold, or inflammation of the nasal membranes (rhinitis).  Gastroesophageal reflux disease (GERD).  Exercise or strenuous activity. What are the signs or symptoms? Symptoms of this condition may occur right after asthma is triggered or many hours later. Symptoms include:  Wheezing. This can  sound like whistling when you breathe.  Excessive nighttime or early morning coughing.  Frequent or severe coughing with a common cold.  Chest tightness.  Shortness of breath.  Tiredness (fatigue) with minimal activity. How is this diagnosed? This condition is diagnosed based on:  Your medical history.  A physical exam.  Tests, which may include: ? Lung function studies and pulmonary studies (spirometry). These tests can evaluate the flow of air in your lungs. ? Allergy tests. ? Imaging tests, such as X-rays. How is this treated? There is no cure for this condition, but treatment can help control your symptoms. Treatment for asthma usually involves:  Identifying and avoiding your asthma triggers.  Using medicines to control your symptoms. Generally, two types of medicines are used to treat asthma: ? Controller medicines. These help prevent asthma symptoms from occurring. They are usually taken every day. ? Fast-acting reliever or rescue medicines. These quickly relieve asthma symptoms by widening the narrow and tight airways. They are used as needed and provide short-term relief.  Using supplemental oxygen. This may be needed during a severe episode.  Using other medicines, such as: ? Allergy medicines, such as antihistamines, if your asthma attacks are triggered by allergens. ? Immune medicines (immunomodulators). These are medicines that help control the immune system.  Creating an asthma action plan. An asthma action plan is a written plan for managing and treating your asthma attacks. This plan includes: ? A list of your asthma triggers and how to avoid them. ? Information about when medicines should be taken and when their dosage should be changed. ? Instructions about using a device called a peak flow meter. A peak flow meter measures how well the lungs are working   and the severity of your asthma. It helps you monitor your condition. Follow these instructions at  home: Controlling your home environment Control your home environment in the following ways to help avoid triggers and prevent asthma attacks:  Change your heating and air conditioning filter regularly.  Limit your use of fireplaces and wood stoves.  Get rid of pests (such as roaches and mice) and their droppings.  Throw away plants if you see mold on them.  Clean floors and dust surfaces regularly. Use unscented cleaning products.  Try to have someone else vacuum for you regularly. Stay out of rooms while they are being vacuumed and for a short while afterward. If you vacuum, use a dust mask from a hardware store, a double-layered or microfilter vacuum cleaner bag, or a vacuum cleaner with a HEPA filter.  Replace carpet with wood, tile, or vinyl flooring. Carpet can trap dander and dust.  Use allergy-proof pillows, mattress covers, and box spring covers.  Keep your bedroom a trigger-free room.  Avoid pets and keep windows closed when allergens are in the air.  Wash beddings every week in hot water and dry them in a dryer.  Use blankets that are made of polyester or cotton.  Clean bathrooms and kitchens with bleach. If possible, have someone repaint the walls in these rooms with mold-resistant paint. Stay out of the rooms that are being cleaned and painted.  Wash your hands often with soap and water. If soap and water are not available, use hand sanitizer.  Do not allow anyone to smoke in your home. General instructions  Take over-the-counter and prescription medicines only as told by your health care provider. ? Speak with your health care provider if you have questions about how or when to take the medicines. ? Make note if you are requiring more frequent dosages.  Do not use any products that contain nicotine or tobacco, such as cigarettes and e-cigarettes. If you need help quitting, ask your health care provider. Also, avoid being exposed to secondhand smoke.  Use a peak  flow meter as told by your health care provider. Record and keep track of the readings.  Understand and use the asthma action plan to help minimize, or stop an asthma attack, without needing to seek medical care.  Make sure you stay up to date on your yearly vaccinations as told by your health care provider. This may include vaccines for the flu and pneumonia.  Avoid outdoor activities when allergen counts are high and when air quality is low.  Wear a ski mask that covers your nose and mouth during outdoor winter activities. Exercise indoors on cold days if you can.  Warm up before exercising, and take time for a cool-down period after exercise.  Keep all follow-up visits as told by your health care provider. This is important. Where to find more information  For information about asthma, turn to the Centers for Disease Control and Prevention at www.cdc.gov/asthma/faqs.htm  For air quality information, turn to AirNow at https://airnow.gov/ Contact a health care provider if:  You have wheezing, shortness of breath, or a cough even while you are taking medicine to prevent attacks.  The mucus you cough up (sputum) is thicker than usual.  Your sputum changes from clear or white to yellow, green, gray, or bloody.  Your medicines are causing side effects, such as a rash, itching, swelling, or trouble breathing.  You need to use a reliever medicine more than 2-3 times a week.  Your peak   flow reading is still at 50-79% of your personal best after following your action plan for 1 hour.  You have a fever. Get help right away if:  You are getting worse and do not respond to treatment during an asthma attack.  You are short of breath when at rest or when doing very little physical activity.  You have difficulty eating, drinking, or talking.  You have chest pain or tightness.  You develop a fast heartbeat or palpitations.  You have a bluish color to your lips or fingernails.  You  are light-headed or dizzy, or you faint.  Your peak flow reading is less than 50% of your personal best.  You feel too tired to breathe normally. Summary  Asthma is a long-term (chronic) condition that causes recurrent episodes in which the airways become tight and narrow. These episodes can cause coughing, wheezing, shortness of breath, and chest pain.  Asthma cannot be cured, but medicines and lifestyle changes can help control it and treat acute attacks.  Make sure you understand how to avoid triggers and how and when to use your medicines.  Asthma attacks can range from minor to life threatening. Get help right away if you have an asthma attack and do not respond to treatment with your usual rescue medicines. This information is not intended to replace advice given to you by your health care provider. Make sure you discuss any questions you have with your health care provider. Document Revised: 07/31/2018 Document Reviewed: 07/02/2016 Elsevier Patient Education  2020 [employer]  

## 2019-08-26 NOTE — Telephone Encounter (Signed)
Rx written. Need to make note in hosp f/u note today about medical necessity

## 2019-08-27 ENCOUNTER — Telehealth: Payer: Self-pay

## 2019-08-27 ENCOUNTER — Other Ambulatory Visit: Payer: Self-pay | Admitting: Family Medicine

## 2019-08-27 DIAGNOSIS — E875 Hyperkalemia: Secondary | ICD-10-CM

## 2019-08-27 LAB — RENAL FUNCTION PANEL
Albumin: 3.9 g/dL (ref 3.7–4.7)
BUN/Creatinine Ratio: 22 (ref 12–28)
BUN: 21 mg/dL (ref 8–27)
CO2: 21 mmol/L (ref 20–29)
Calcium: 9.5 mg/dL (ref 8.7–10.3)
Chloride: 99 mmol/L (ref 96–106)
Creatinine, Ser: 0.97 mg/dL (ref 0.57–1.00)
GFR calc Af Amer: 66 mL/min/{1.73_m2} (ref >59)
GFR calc non Af Amer: 57 mL/min/{1.73_m2} — ABNORMAL LOW (ref >59)
Glucose: 184 mg/dL — ABNORMAL HIGH (ref 65–99)
Phosphorus: 5 mg/dL — ABNORMAL HIGH (ref 3.0–4.3)
Potassium: 5.9 mmol/L — ABNORMAL HIGH (ref 3.5–5.2)
Sodium: 133 mmol/L — ABNORMAL LOW (ref 134–144)

## 2019-08-27 MED ORDER — SODIUM POLYSTYRENE SULFONATE 15 GM/60ML PO SUSP: 30.0000 g | ORAL | 0 refills | Status: AC

## 2019-08-27 NOTE — Telephone Encounter (Signed)
Patient's daughter advised as below. They agree to take medication and will come in on Monday to check lab.

## 2019-08-27 NOTE — Telephone Encounter (Signed)
-----   Message from [name], MD sent at 08/27/2019  8:15 AM EDT ----- Potassium is high again.  Need to cut potassium containing foods and will start Kayexylate qod x2 doses.  Then recheck BMP on Monday.

## 2019-08-27 NOTE — Telephone Encounter (Signed)
lmtcb

## 2019-08-31 DIAGNOSIS — E875 Hyperkalemia: Secondary | ICD-10-CM | POA: Diagnosis not present

## 2019-09-01 ENCOUNTER — Other Ambulatory Visit: Payer: Self-pay

## 2019-09-01 DIAGNOSIS — E875 Hyperkalemia: Secondary | ICD-10-CM

## 2019-09-01 LAB — BASIC METABOLIC PANEL
BUN/Creatinine Ratio: 13 (ref 12–28)
BUN: 13 mg/dL (ref 8–27)
CO2: 18 mmol/L — ABNORMAL LOW (ref 20–29)
Calcium: 9 mg/dL (ref 8.7–10.3)
Chloride: 101 mmol/L (ref 96–106)
Creatinine, Ser: 1.01 mg/dL — ABNORMAL HIGH (ref 0.57–1.00)
GFR calc Af Amer: 63 mL/min/{1.73_m2} (ref >59)
GFR calc non Af Amer: 55 mL/min/{1.73_m2} — ABNORMAL LOW (ref >59)
Glucose: 265 mg/dL — ABNORMAL HIGH (ref 65–99)
Potassium: 5.4 mmol/L — ABNORMAL HIGH (ref 3.5–5.2)
Sodium: 134 mmol/L (ref 134–144)

## 2019-09-01 MED ORDER — SODIUM POLYSTYRENE SULFONATE 15 GM/60ML PO SUSP: 30.0000 g | Freq: Every day | ORAL | 0 refills | Status: AC

## 2019-09-01 NOTE — Telephone Encounter (Signed)
-----   Message from [name], MD sent at 09/01/2019  8:18 AM EDT ----- Potassium is improving, but not normalized.  Need to refill Kayexalate and have her take it daily for next week and recheck

## 2019-09-01 NOTE — Telephone Encounter (Signed)
Patient's daughter advised as below.  °

## 2019-09-01 NOTE — Telephone Encounter (Signed)
LMTCB

## 2019-09-04 ENCOUNTER — Ambulatory Visit: Payer: Medicaid Other

## 2019-09-08 ENCOUNTER — Ambulatory Visit: Payer: Medicaid Other

## 2019-09-09 DIAGNOSIS — E875 Hyperkalemia: Secondary | ICD-10-CM | POA: Diagnosis not present

## 2019-09-10 ENCOUNTER — Other Ambulatory Visit: Payer: Self-pay

## 2019-09-10 DIAGNOSIS — E875 Hyperkalemia: Secondary | ICD-10-CM

## 2019-09-10 LAB — BASIC METABOLIC PANEL
BUN/Creatinine Ratio: 15 (ref 12–28)
BUN: 15 mg/dL (ref 8–27)
CO2: 20 mmol/L (ref 20–29)
Calcium: 9 mg/dL (ref 8.7–10.3)
Chloride: 103 mmol/L (ref 96–106)
Creatinine, Ser: 0.97 mg/dL (ref 0.57–1.00)
GFR calc Af Amer: 66 mL/min/{1.73_m2} (ref >59)
GFR calc non Af Amer: 57 mL/min/{1.73_m2} — ABNORMAL LOW (ref >59)
Glucose: 197 mg/dL — ABNORMAL HIGH (ref 65–99)
Potassium: 4.8 mmol/L (ref 3.5–5.2)
Sodium: 136 mmol/L (ref 134–144)

## 2019-09-25 ENCOUNTER — Telehealth: Payer: Self-pay

## 2019-09-25 NOTE — Telephone Encounter (Signed)
Copied from [place] [id number]. Topic: General - Other >> Sep 25, 2019  3:08 PM Oneta Rack wrote: Reason for CRM: Shuffler,[name] D (daughter) states she received 2 missed calls and no voicemail was left. Daughter thinks it may be regarding lab orders, please advise

## 2019-09-29 ENCOUNTER — Other Ambulatory Visit: Payer: Self-pay

## 2019-09-29 ENCOUNTER — Encounter: Payer: Self-pay | Admitting: Internal Medicine

## 2019-09-29 ENCOUNTER — Ambulatory Visit (INDEPENDENT_AMBULATORY_CARE_PROVIDER_SITE_OTHER): Payer: Medicaid Other | Admitting: Internal Medicine

## 2019-09-29 VITALS — BP 118/54 | HR 78 | Temp 97.8°F | Ht <= 58 in | Wt 158.4 lb

## 2019-09-29 DIAGNOSIS — J4550 Severe persistent asthma, uncomplicated: Secondary | ICD-10-CM | POA: Diagnosis not present

## 2019-09-29 MED ORDER — ARFORMOTEROL TARTRATE 15 MCG/2ML IN NEBU: 15.0000 ug | INHALATION_SOLUTION | Freq: Two times a day (BID) | RESPIRATORY_TRACT | 12 refills | Status: DC

## 2019-09-29 MED ORDER — BUDESONIDE-FORMOTEROL FUMARATE 160-4.5 MCG/ACT IN AERO: 2.0000 | INHALATION_SPRAY | Freq: Two times a day (BID) | RESPIRATORY_TRACT | 6 refills | Status: DC

## 2019-09-29 NOTE — Patient Instructions (Addendum)
Symbicort as needed  CONTINUE PULMICORT NEBS TWICE DAILY START BROVANA TWICE DAILY

## 2019-09-29 NOTE — Progress Notes (Signed)
* [facility]  Date: 09/29/2019  MRN# [id number] Whitney Fleming March 30, 1946  Whitney Fleming is a 74 y.o. old female seen in follow up for chief complaint of dyspnea.   SYNOPSIS Whitney Fleming is a 74 y.o. female  with severe allergic asthma.  She speaks [place] her daughter translates.  She has had 3 admissions to the hospital in 2019 with asthma exacerbations including most recently in September 2019.  She had not had further hospital admissions thus far this year but required 2 courses of prednisone in May 2020 which caused her blood sugar to spike.  She has not been very compliant with her medications, she has a Symbicort inhaler, however at her last visit she noted that it had run out and she had not bothered refilling it subsequently her asthma became worse.  At last visit she was strongly advised to make sure that she uses her Symbicort inhaler daily as instructed, use albuterol inhaler as needed.  She continues to rely on and asked for courses of prednisone, however we have instructed her that this is only a rescue medication and has several deleterious effects therefore she should be maintained on her regular medications and use prednisone only for exacerbations.  She was asked to continue azithromycin 2 and 50 mg 5 days/week.  She previously had been evaluated for an injectable biologic agent for asthma but declined because she did not want injections.   At last visit she was reminded to use Symbicort inhaler 2 puffs twice daily, in addition to her Xopenex nebulizer.  We previously considered her for Nucala but declined anything that involve injections.  She was therefore maintained on azithromycin 5 times weekly. Patient is noted to have her breathing getting worse, and they were worried that she had to go to ED, she went to see her physician the next day and received prednisone which helped.   She has now started taking symbicort 2 puffs twice per day and rinses per day. She is  taking singulair, azithro 5 days per week. She is off the prednisone and doing well with that. She is using rescue inhaler she is using 2 times per day, pulmicort nebs twice per day.    **IgE 12/24/17; IGe 5.  **CXR 11/05/17; changes of chronic bronchitis, right heart enlargement.  **CBC 02/11/18>> absolute eosinophil count 800. **CBC 09/25/17; Abs eos count 300.    CC Follow-up asthma  HPI Chronic persistent wheezing Chronic shortness of breath Chronic dyspnea exertion No symptoms at rest Patient refuses to see cardiology Patient recently admitted for shortness of breath dyspnea exertion and wheezing Was discharged on Pulmicort nebs twice daily Uses Symbicort as needed Patient no longer can tolerate traditional inhaler therapy and needs nebulized therapy for her chronic persistent asthma She is not on prednisone therapy She is not on theophylline therapy Patient currently on azithromycin  Most of her history is provided by her daughter   Medication:    Current Outpatient Medications:  .  Accu-Chek FastClix Lancets MISC, Use as directed to check blood glucose twice daily, Disp: 100 each, Rfl: 2 .  albuterol (PROVENTIL) (2.5 MG/3ML) 0.083% nebulizer solution, USE 1 VIAL IN NEBULIZER THREE TIMES DAILY (Patient taking differently: Take 2.5 mg by nebulization 3 (three) times daily. ), Disp: 225 mL, Rfl: 6 .  AMBULATORY NON FORMULARY MEDICATION, Medication Name: Aero chamber use as directed. 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  Blood Glucose Monitoring Suppl (ACCU-CHEK GUIDE) w/Device KIT, USE TO TEST BLOOD SUGAR TWICE DAILY,  Disp: 1 kit, Rfl: 0 .  budesonide-formoterol (SYMBICORT) 160-4.5 MCG/ACT inhaler, Inhale 2 puffs into the lungs 2 (two) times daily. Rinse mouth after use, Disp: 3 Inhaler, Rfl: 6 .  carvedilol (COREG) 12.5 MG tablet, Take 1 tablet (12.5 mg total) by mouth 2 (two) times daily with a meal., Disp: 180 tablet, Rfl: 0 .  dextromethorphan-guaiFENesin (MUCINEX DM) 30-600  MG 12hr tablet, Take 1 tablet by mouth 2 (two) times daily., Disp: 15 tablet, Rfl: 0 .  empagliflozin (JARDIANCE) 25 MG TABS tablet, Take 25 mg by mouth daily., Disp: 90 tablet, Rfl: 1 .  glucose blood (ACCU-CHEK GUIDE) test strip, Use as instructed to test blood glucose twice daily, Disp: 100 each, Rfl: 2 .  insulin glargine (LANTUS SOLOSTAR) 100 UNIT/ML Solostar Pen, Inject 10 Units into the skin daily., Disp: 5 pen, Rfl: 1 .  Insulin Pen Needle (NOVOFINE) 32G X 6 MM MISC, Use as directed to inject insulin daily, Disp: 100 each, Rfl: 2 .  losartan (COZAAR) 50 MG tablet, Take 1 tablet (50 mg total) by mouth daily., Disp: 90 tablet, Rfl: 3 .  metFORMIN (GLUCOPHAGE) 1000 MG tablet, Take 1 tablet (1,000 mg total) by mouth 2 (two) times daily with a meal., Disp: 180 tablet, Rfl: 3 .  montelukast (SINGULAIR) 10 MG tablet, Take 10 mg by mouth at bedtime., Disp: , Rfl:  .  predniSONE (DELTASONE) 10 MG tablet, Take 3 tablets (30 mg total) by mouth daily with breakfast. Take 30 mg daily taper by 10 mg daily then stop (Patient not taking: Reported on 08/24/2019), Disp: 6 tablet, Rfl: 0 .  rosuvastatin (CRESTOR) 5 MG tablet, Take 1 tablet (5 mg total) by mouth daily., Disp: 90 tablet, Rfl: 3 .  sitaGLIPtin (JANUVIA) 100 MG tablet, Take 1 tablet (100 mg total) by mouth daily., Disp: 90 tablet, Rfl: 1   Allergies:  Patient has no known allergies.    Review of Systems:  Gen:  Denies  fever, sweats, chills weight loss  HEENT: Denies blurred vision, double vision, ear pain, eye pain, hearing loss, nose bleeds, sore throat Cardiac:  No dizziness, chest pain or heaviness, chest tightness,+edema, No JVD Resp:   No cough, -sputum production, +shortness of breath,+wheezing, -hemoptysis,  Gi: Denies swallowing difficulty, stomach pain, nausea or vomiting, diarrhea, constipation, bowel incontinence Gu:  Denies bladder incontinence, burning urine Ext:   Denies Joint pain, stiffness or swelling Skin: Denies  skin  rash, easy bruising or bleeding or hives Endoc:  Denies polyuria, polydipsia , polyphagia or weight change Psych:   Denies depression, insomnia or hallucinations  Other:  All other systems negative   BP (!) 118/54 (BP Location: Left Arm, Cuff Size: Large)   Pulse 78   Temp 97.8 F (36.6 C) (Temporal)   Ht '4\' 8"'$  (1.422 m)   Wt 158 lb 6.4 oz (71.8 kg)   SpO2 97%   BMI 35.51 kg/m  Physical Examination:   General Appearance: No distress  Neuro:without focal findings,  speech normal,  HEENT: PERRLA, EOM intact.   Pulmonary: normal breath sounds, No wheezing.  CardiovascularNormal S1,S2.  No m/r/g.   Abdomen: Benign, Soft, non-tender. Renal:  No costovertebral tenderness  GU:  Not performed at this time. Endoc: No evident thyromegaly Skin:   warm, no rashes, no ecchymosis  Extremities: normal, no cyanosis, clubbing. PSYCHIATRIC: Mood, affect within normal limits.   ALL OTHER ROS ARE NEGATIVE   Assessment and Plan:  Chronic severe persistent asthma Patient no longer can take traditional inhaler therapy  Continue Pulmicort nebs twice daily Brovana nebulizer therapy added Patient would like to have Symbicort as needed Also albuterol as needed Continue azithromycin for prevention of exacerbations Overall her prognosis is poor Patient is a high risk for complications  Dyspnea exertion and shortness of breath most likely related to her obesity underlying lung disease as well as untreated and undiagnosed CAD diastolic heart failure  Patient needs to be reassessed for exertional hypoxia nocturnal hypoxia We will obtain overnight pulse oximetry on room air   Vaccination update Pneumovax completed several years ago according to family COVID-19 vaccination completed February 2021   COVID-19 EDUCATION: The signs and symptoms of COVID-19 were discussed with the patient and how to seek care for testing.  The importance of social distancing was discussed today. Hand Washing  Techniques and avoid touching face was advised.     MEDICATION ADJUSTMENTS/LABS AND TESTS ORDERED: Continue nebulized therapy as prescribed Stop theophylline  Check 6-minute walk test and overnight pulse oximetry   CURRENT MEDICATIONS REVIEWED AT LENGTH WITH PATIENT TODAY   Patient/family  satisfied with Plan of action and management. All questions answered Follow-up 6 months  Total time spent 33 minutes   Corrin Parker, M.D.  [facility]  Medical Director [place] Director [facility] Cardio-P[facility] Department

## 2019-10-04 ENCOUNTER — Other Ambulatory Visit: Payer: Self-pay | Admitting: Family Medicine

## 2019-10-04 NOTE — Telephone Encounter (Signed)
Requested Prescriptions  Pending Prescriptions Disp Refills  . rosuvastatin (CRESTOR) 5 MG tablet [Pharmacy Med Name: Rosuvastatin Calcium 5 MG Oral Tablet] 90 tablet 0    Sig: Take 1 tablet by mouth once daily     Cardiovascular:  Antilipid - Statins Failed - 10/04/2019  4:15 PM      Failed - Total Cholesterol in normal range and within 360 days    Cholesterol, Total  Date Value Ref Range Status  01/17/2018 110 100 - 199 mg/dL Final   Cholesterol  Date Value Ref Range Status  12/25/2012 91 0 - 200 mg/dL Final         Failed - LDL in normal range and within 360 days    Ldl Cholesterol, Calc  Date Value Ref Range Status  12/25/2012 36 0 - 100 mg/dL Final   LDL Calculated  Date Value Ref Range Status  01/17/2018 52 0 - 99 mg/dL Final         Failed - HDL in normal range and within 360 days    HDL Cholesterol  Date Value Ref Range Status  12/25/2012 26 (L) 40 - 60 mg/dL Final   HDL  Date Value Ref Range Status  01/17/2018 37 (L) >39 mg/dL Final         Failed - Triglycerides in normal range and within 360 days    Triglycerides  Date Value Ref Range Status  01/17/2018 103 0 - 149 mg/dL Final  12/25/2012 143 0 - 200 mg/dL Final         Passed - Patient is not pregnant      Passed - Valid encounter within last 12 months    Recent Outpatient Visits          1 month ago Severe persistent asthma without complication   [facility], Dionne Bucy, MD   9 months ago Type 2 diabetes mellitus with hyperglycemia, without long-term current use of insulin [facility])   [facility], Dionne Bucy, MD   11 months ago Type 2 diabetes mellitus with hyperglycemia, without long-term current use of insulin [facility])   [facility], Dionne Bucy, MD   1 year ago Type 2 diabetes mellitus with hyperglycemia, without long-term current use of insulin [facility])   [facility], Dionne Bucy, MD   1 year ago Type 2 diabetes  mellitus with hyperglycemia, without long-term current use of insulin [facility])   [place], Dionne Bucy, MD      Future Appointments            In 1 month Bacigalupo, Dionne Bucy, MD [facility]

## 2019-10-12 ENCOUNTER — Telehealth: Payer: Self-pay | Admitting: Internal Medicine

## 2019-10-12 NOTE — Telephone Encounter (Addendum)
Called [employer] to start PA. Also called daughter and left message.  PA request received from [facility] requested: Brovana Neb Tried/failed: Proventil Neb, [place], Duoneb, Xopenex PA request has been sent to plan, and a determination is expected within 24 hrs    PA#:[id number] Call ref#: [id number]   Call [employer] back or go to [email or web address] for response.

## 2019-10-13 MED ORDER — ALBUTEROL SULFATE 1.25 MG/3ML IN NEBU: 1.0000 | INHALATION_SOLUTION | RESPIRATORY_TRACT | 12 refills | Status: DC | PRN

## 2019-10-13 NOTE — Telephone Encounter (Signed)
Accuneb 1.25 every 4 hrs as needed

## 2019-10-13 NOTE — Telephone Encounter (Signed)
Spoke with daughter she has a couple questions  1. Does her mom need to do the albuterol(accuneb) and the albuterol(proventil) together or is supposed only do the Accuneb we sent in today?  2. She is requesting something for her mothers cough. States they have tried Delsym and mucinex.  Please advise

## 2019-10-13 NOTE — Telephone Encounter (Signed)
Dr. Mortimer Fries prior Whitney Fleming for this medication has been denied. She has to try a different albuterol neb medication before she can be approved. She has only been on albuterol(proventil) so far.  She has to try any of the following first.. Albuterol(accuneb) 0.63 or 1.25 Albuterol Sulfate 2.5  Please advise on what you would like to do

## 2019-10-13 NOTE — Telephone Encounter (Signed)
ATC daughter Joya Gaskins at [phone] to let her know that we could no get the [facility] approved and that we have sent in new RX for Accuneb. Left message.  Nothing further needed at this time.

## 2019-10-14 ENCOUNTER — Encounter: Payer: Self-pay | Admitting: Internal Medicine

## 2019-10-14 NOTE — Telephone Encounter (Signed)
Called and spoke with daughter to let her know about Dr. Zoila Shutter recs. She voiced understanding. Nothing further needed at this time.

## 2019-10-14 NOTE — Telephone Encounter (Signed)
Accuneb only Robitussin DM OTC 5 ml every 6 hrs as needed

## 2019-10-17 ENCOUNTER — Telehealth: Payer: Self-pay | Admitting: Pulmonary Disease

## 2019-10-17 NOTE — Telephone Encounter (Signed)
Patient's daughter Malachi Paradise, called with issues with her mother's prescriptions at [facility] in particular of the Symbicort prescription.  [place] at [employer] and corrected the issue.  No further action required.  Renold Don, MD [facility]

## 2019-10-19 ENCOUNTER — Telehealth: Payer: Self-pay | Admitting: Internal Medicine

## 2019-10-19 DIAGNOSIS — J4550 Severe persistent asthma, uncomplicated: Secondary | ICD-10-CM

## 2019-10-19 MED ORDER — BUDESONIDE-FORMOTEROL FUMARATE 160-4.5 MCG/ACT IN AERO: 2.0000 | INHALATION_SPRAY | Freq: Two times a day (BID) | RESPIRATORY_TRACT | 6 refills | Status: DC

## 2019-10-19 MED ORDER — ALBUTEROL SULFATE (2.5 MG/3ML) 0.083% IN NEBU: INHALATION_SOLUTION | RESPIRATORY_TRACT | 6 refills | Status: DC

## 2019-10-19 NOTE — Telephone Encounter (Signed)
Pts daughter called to get a med refill of her Symbicort. She states this has been going on for weeks. I let her know a Rx was sent in on 4/20 but she states that the pharmacy won't give her any refills due to [facility] license. She is also needing Proventil as well. Pts daughter would like a call back as soon as possible. Cb#: [phone]

## 2019-10-19 NOTE — Telephone Encounter (Signed)
Call made to [facility], Dr Zoila Shutter license is showing as in-active. Pharmacy tech states new prescription will need to be sent in. Verbal given from MD CG to send under her name. Will send email to Lynelle Smoke and Tarri Fuller to address in-active license per pharmacy.   Call made to patient daughter Malachi Paradise, made aware of medication has been sent in under [facility]. Voiced understanding.   Nothing further needed at this time.

## 2019-10-21 ENCOUNTER — Telehealth: Payer: Self-pay | Admitting: Internal Medicine

## 2019-10-21 NOTE — Telephone Encounter (Signed)
ONO on RA done by [employer] 10/15/19 was reviewed by Dr Mortimer Fries and per Dr Mortimer Fries- no oxygen needed.   LMTCB for pt's daughter Joya Gaskins at [phone]

## 2019-10-22 NOTE — Telephone Encounter (Signed)
Spoke with the pt's daughter and notified of ONO results and she verbalized understanding. Nothing further needed.

## 2019-10-26 DIAGNOSIS — E119 Type 2 diabetes mellitus without complications: Secondary | ICD-10-CM | POA: Diagnosis not present

## 2019-10-26 LAB — HM DIABETES EYE EXAM

## 2019-10-29 ENCOUNTER — Emergency Department: Payer: Medicaid Other

## 2019-10-29 ENCOUNTER — Emergency Department
Admission: EM | Admit: 2019-10-29 | Discharge: 2019-10-29 | Disposition: A | Discharge status: Home / Self Care | Payer: Medicaid Other | Attending: Emergency Medicine | Admitting: Emergency Medicine

## 2019-10-29 ENCOUNTER — Encounter: Payer: Self-pay | Admitting: Emergency Medicine

## 2019-10-29 ENCOUNTER — Other Ambulatory Visit: Payer: Self-pay

## 2019-10-29 DIAGNOSIS — E119 Type 2 diabetes mellitus without complications: Secondary | ICD-10-CM | POA: Insufficient documentation

## 2019-10-29 DIAGNOSIS — I10 Essential (primary) hypertension: Secondary | ICD-10-CM | POA: Diagnosis not present

## 2019-10-29 DIAGNOSIS — R0602 Shortness of breath: Secondary | ICD-10-CM | POA: Diagnosis not present

## 2019-10-29 DIAGNOSIS — Z79899 Other long term (current) drug therapy: Secondary | ICD-10-CM | POA: Diagnosis not present

## 2019-10-29 DIAGNOSIS — J441 Chronic obstructive pulmonary disease with (acute) exacerbation: Secondary | ICD-10-CM | POA: Insufficient documentation

## 2019-10-29 DIAGNOSIS — Z794 Long term (current) use of insulin: Secondary | ICD-10-CM | POA: Diagnosis not present

## 2019-10-29 LAB — COMPREHENSIVE METABOLIC PANEL WITH GFR
ALT: 14 U/L (ref 0–44)
AST: 15 U/L (ref 15–41)
Albumin: 3.8 g/dL (ref 3.5–5.0)
Alkaline Phosphatase: 72 U/L (ref 38–126)
Anion gap: 7 (ref 5–15)
BUN: 27 mg/dL — ABNORMAL HIGH (ref 8–23)
CO2: 19 mmol/L — ABNORMAL LOW (ref 22–32)
Calcium: 8.7 mg/dL — ABNORMAL LOW (ref 8.9–10.3)
Chloride: 104 mmol/L (ref 98–111)
Creatinine, Ser: 1.19 mg/dL — ABNORMAL HIGH (ref 0.44–1.00)
GFR calc Af Amer: 52 mL/min — ABNORMAL LOW
GFR calc non Af Amer: 45 mL/min — ABNORMAL LOW
Glucose, Bld: 248 mg/dL — ABNORMAL HIGH (ref 70–99)
Potassium: 5.5 mmol/L — ABNORMAL HIGH (ref 3.5–5.1)
Sodium: 130 mmol/L — ABNORMAL LOW (ref 135–145)
Total Bilirubin: 0.5 mg/dL (ref 0.3–1.2)
Total Protein: 7.1 g/dL (ref 6.5–8.1)

## 2019-10-29 LAB — CBC
HCT: 42.1 % (ref 36.0–46.0)
Hemoglobin: 13.6 g/dL (ref 12.0–15.0)
MCH: 25.9 pg — ABNORMAL LOW (ref 26.0–34.0)
MCHC: 32.3 g/dL (ref 30.0–36.0)
MCV: 80.2 fL (ref 80.0–100.0)
Platelets: 194 K/uL (ref 150–400)
RBC: 5.25 MIL/uL — ABNORMAL HIGH (ref 3.87–5.11)
RDW: 14 % (ref 11.5–15.5)
WBC: 11.5 K/uL — ABNORMAL HIGH (ref 4.0–10.5)
nRBC: 0 % (ref 0.0–0.2)

## 2019-10-29 LAB — TROPONIN I (HIGH SENSITIVITY): Troponin I (High Sensitivity): 5 ng/L

## 2019-10-29 MED ORDER — PREDNISONE 20 MG PO TABS: 40.0000 mg | ORAL_TABLET | Freq: Every day | ORAL | 0 refills | Status: DC

## 2019-10-29 MED ORDER — MUCINEX 600 MG PO TB12: 600.0000 mg | ORAL_TABLET | Freq: Two times a day (BID) | ORAL | 1 refills | Status: AC

## 2019-10-29 MED ORDER — IPRATROPIUM-ALBUTEROL 0.5-2.5 (3) MG/3ML IN SOLN
3.0000 mL | Freq: Once | RESPIRATORY_TRACT | Status: AC
  Administered 2019-10-29: 3 mL via RESPIRATORY_TRACT

## 2019-10-29 MED ORDER — METHYLPREDNISOLONE SODIUM SUCC 125 MG IJ SOLR
125.0000 mg | Freq: Once | INTRAMUSCULAR | Status: AC
  Administered 2019-10-29: 125 mg via INTRAVENOUS
  Filled 2019-10-29: qty 2

## 2019-10-29 MED ORDER — IPRATROPIUM-ALBUTEROL 0.5-2.5 (3) MG/3ML IN SOLN
3.0000 mL | Freq: Once | RESPIRATORY_TRACT | Status: AC
  Administered 2019-10-29: 3 mL via RESPIRATORY_TRACT
  Filled 2019-10-29: qty 9

## 2019-10-29 NOTE — ED Triage Notes (Signed)
Pt from home via POV with complaint of SOB with cough. Pt audibly wheezing on arrival, RA saturation 97%. Pt reports trying nebulizer and inhaler at home with no relief

## 2019-10-29 NOTE — ED Notes (Signed)
Pt reports cough began two days ago, with breathing difficulty beginning approx 0230 this AM  Pt presents with upper airway wheezing and occasional dry cough.   Daughter at bedside to interpret

## 2019-10-29 NOTE — ED Provider Notes (Signed)
[facility] Emergency Department Provider Note  Time seen: 3:15 AM  I have reviewed the triage vital signs and the nursing notes.   HISTORY  Chief Complaint Cough and Shortness of Breath   HPI Whitney Fleming is a 74 y.o. female with a past medical history of anemia, COPD, diabetes, hypertension, presents to the emergency department for shortness of breath.  According to the daughter patient has been feeling short of breath over the past 24 hours.  No fever.  States the patient has been wheezing at home.  They have been using the patient's home inhalers and nebulizer without relief so they came to the emergency department.  Denies any chest pain at any point.  Daughter states this occurs every several months.   Past Medical History:  Diagnosis Date  . Anemia    normocytic anemia  . Asthma   . Cardiomegaly   . COPD (chronic obstructive pulmonary disease) ([place])   . Diabetes mellitus without complication ([place])   . Hypertension   . Hyponatremia     Patient Active Problem List   Diagnosis Date Noted  . Hyperlipidemia   . Asthma exacerbation 08/18/2019  . COPD (chronic obstructive pulmonary disease) ([place]) 03/03/2018  . Hyperlipidemia associated with type 2 diabetes mellitus ([place]) 12/20/2017  . GERD (gastroesophageal reflux disease) 12/20/2017  . Respiratory failure ([place]) 12/02/2017  . Asthma 09/26/2017  . HTN (hypertension) 09/26/2017  . Diabetes ([place]) 09/26/2017    Past Surgical History:  Procedure Laterality Date  . TUBAL LIGATION      Prior to Admission medications   Medication Sig Start Date End Date Taking? Authorizing Provider  Accu-Chek FastClix Lancets MISC Use as directed to check blood glucose twice daily 08/19/19   Fritzi Mandes, MD  albuterol (ACCUNEB) 1.25 MG/3ML nebulizer solution Take 3 mLs (1.25 mg total) by nebulization every 4 (four) hours as needed for wheezing. 10/13/19   Flora Lipps, MD  albuterol (PROVENTIL) (2.5 MG/3ML) 0.083%  nebulizer solution USE 1 VIAL IN NEBULIZER THREE TIMES DAILY 10/19/19   Tyler Pita, MD  AMBULATORY NON FORMULARY MEDICATION Medication Name: Aero chamber use as directed. YQ:I34.742 12/24/17   [name], MD  Blood Glucose Monitoring Suppl (ACCU-CHEK GUIDE) w/Device KIT USE TO TEST BLOOD SUGAR TWICE DAILY 01/19/19   Bacigalupo, Dionne Bucy, MD  budesonide-formoterol [facility] 160-4.5 MCG/ACT inhaler Inhale 2 puffs into the lungs 2 (two) times daily. Rinse mouth after use 10/19/19   Tyler Pita, MD  carvedilol (COREG) 12.5 MG tablet Take 1 tablet (12.5 mg total) by mouth 2 (two) times daily with a meal. 11/21/18   Bacigalupo, Dionne Bucy, MD  dextromethorphan-guaiFENesin (MUCINEX DM) 30-600 MG 12hr tablet Take 1 tablet by mouth 2 (two) times daily. 08/19/19   Fritzi Mandes, MD  empagliflozin (JARDIANCE) 25 MG TABS tablet Take 25 mg by mouth daily. 11/05/18   [name], MD  glucose blood (ACCU-CHEK GUIDE) test strip Use as instructed to test blood glucose twice daily 08/19/19   Fritzi Mandes, MD  insulin glargine (LANTUS SOLOSTAR) 100 UNIT/ML Solostar Pen Inject 10 Units into the skin daily. 08/24/19   [name], MD  Insulin Pen Needle (NOVOFINE) 32G X 6 MM MISC Use as directed to inject insulin daily 08/19/19   Fritzi Mandes, MD  losartan (COZAAR) 50 MG tablet Take 1 tablet (50 mg total) by mouth daily. 11/05/18   [name], MD  metFORMIN (GLUCOPHAGE) 1000 MG tablet Take 1 tablet (1,000 mg total) by mouth 2 (two)  times daily with a meal. 11/05/18   Bacigalupo, Dionne Bucy, MD  montelukast (SINGULAIR) 10 MG tablet Take 10 mg by mouth at bedtime.    [provider]  rosuvastatin (CRESTOR) 5 MG tablet Take 1 tablet by mouth once daily 10/04/19   [name], MD  sitaGLIPtin (JANUVIA) 100 MG tablet Take 1 tablet (100 mg total) by mouth daily. 11/05/18   [name], MD    No Known Allergies  Family History  Problem Relation Age of Onset  .  Tuberculosis Other   . Healthy Mother   . Heart disease Father   . Asthma Father   . Cancer Neg Hx     Social History Social History   Tobacco Use  . Smoking status: Never Smoker  . Smokeless tobacco: Never Used  Substance Use Topics  . Alcohol use: Never  . Drug use: Never    Review of Systems Constitutional: Negative for fever. Cardiovascular: Negative for chest pain. Respiratory: Positive shortness of breath. Gastrointestinal: Negative for abdominal pain, vomiting Musculoskeletal: Negative for musculoskeletal complaints Neurological: Negative for headache All other ROS negative  ____________________________________________   PHYSICAL EXAM:  VITAL SIGNS: ED Triage Vitals [10/29/19 0311]  Enc Vitals Group     BP      Pulse      Resp      Temp      Temp src      SpO2 97 %     Weight      Height      Head Circumference      Peak Flow      Pain Score      Pain Loc      Pain Edu?      Excl. in [place]?    Constitutional: Alert and oriented. Well appearing and in no distress. Eyes: Normal exam ENT      Head: Normocephalic and atraumatic.      Mouth/Throat: Mucous membranes are moist. Cardiovascular: Normal rate, regular rhythm. Respiratory: Mild tachypnea.  Mild expiratory wheeze bilaterally.  No rales or rhonchi. Gastrointestinal: Soft and nontender. No distention Musculoskeletal: Nontender with normal range of motion in all extremities.  Neurologic:  Normal speech and language. No gross focal neurologic deficits  Skin:  Skin is warm, dry and intact.  Psychiatric: Mood and affect are normal.   ____________________________________________    EKG  EKG viewed and interpreted by myself shows normal sinus rhythm 87 bpm with a narrow QRS, normal axis, normal intervals, no concerning ST changes.  ____________________________________________    RADIOLOGY  X-ray shows small right-sided pleural  effusion.  ____________________________________________   INITIAL IMPRESSION / ASSESSMENT AND PLAN / ED COURSE  Pertinent labs & imaging results that were available during my care of the patient were reviewed by me and considered in my medical decision making (see chart for details).   Patient presents to the emergency department for shortness of breath.  Patient does have moderate expiratory wheeze bilaterally.  History of COPD.  No rales or rhonchi.  We will check labs including cardiac enzymes.  We will obtain a chest x-ray.  We will dose duo nebs and Solu-Medrol and continue to closely monitor.  Patient agreeable to plan of care.  X-ray shows small right pleural effusion.  Lab work largely within normal limits including negative troponin.  Overall patient appears much improved after breathing treatments.  Patient states he is feeling much better.  They are asking about going home.  I believe the patient would  be safe for discharge home with PCP follow-up.  We will discharge with prednisone.  Daughter states she has albuterol nebulizers but is requesting a prescription for Mucinex which I will provide.  Discussed return precautions.  Whitney Fleming was evaluated in Emergency Department on 10/29/2019 for the symptoms described in the history of present illness. She was evaluated in the context of the global COVID-19 pandemic, which necessitated consideration that the patient might be at risk for infection with the SARS-CoV-2 virus that causes COVID-19. Institutional protocols and algorithms that pertain to the evaluation of patients at risk for COVID-19 are in a state of rapid change based on information released by regulatory bodies including the CDC and federal and state organizations. These policies and algorithms were followed during the patient's care in the ED.  ____________________________________________   FINAL CLINICAL IMPRESSION(S) / ED DIAGNOSES  Dyspnea COPD exacerbation    Harvest Dark, MD 10/29/19 [phone]

## 2019-10-29 NOTE — ED Triage Notes (Signed)
Pt to triage via w/c with no distress noted, mask in place; family st hx COPD & asthma, onset SHOB and nonprod cough PTA unrelieved by inhaler & neb at home; denies any recent illness; pt denies any pain

## 2019-10-30 ENCOUNTER — Encounter: Payer: Self-pay | Admitting: Family Medicine

## 2019-11-02 ENCOUNTER — Other Ambulatory Visit: Payer: Self-pay | Admitting: Family Medicine

## 2019-11-02 NOTE — Telephone Encounter (Signed)
Requested Prescriptions  Pending Prescriptions Disp Refills  . rosuvastatin (CRESTOR) 5 MG tablet [Pharmacy Med Name: Rosuvastatin Calcium 5 MG Oral Tablet] 90 tablet 0    Sig: Take 1 tablet by mouth once daily     Cardiovascular:  Antilipid - Statins Failed - 11/02/2019  5:34 PM      Failed - Total Cholesterol in normal range and within 360 days    Cholesterol, Total  Date Value Ref Range Status  01/17/2018 110 100 - 199 mg/dL Final   Cholesterol  Date Value Ref Range Status  12/25/2012 91 0 - 200 mg/dL Final         Failed - LDL in normal range and within 360 days    Ldl Cholesterol, Calc  Date Value Ref Range Status  12/25/2012 36 0 - 100 mg/dL Final   LDL Calculated  Date Value Ref Range Status  01/17/2018 52 0 - 99 mg/dL Final         Failed - HDL in normal range and within 360 days    HDL Cholesterol  Date Value Ref Range Status  12/25/2012 26 (L) 40 - 60 mg/dL Final   HDL  Date Value Ref Range Status  01/17/2018 37 (L) >39 mg/dL Final         Failed - Triglycerides in normal range and within 360 days    Triglycerides  Date Value Ref Range Status  01/17/2018 103 0 - 149 mg/dL Final  12/25/2012 143 0 - 200 mg/dL Final         Passed - Patient is not pregnant      Passed - Valid encounter within last 12 months    Recent Outpatient Visits          2 months ago Severe persistent asthma without complication   [facility] [place], Dionne Bucy, MD   10 months ago Type 2 diabetes mellitus with hyperglycemia, without long-term current use of insulin [facility])   [facility], Dionne Bucy, MD   12 months ago Type 2 diabetes mellitus with hyperglycemia, without long-term current use of insulin [facility])   [facility], Dionne Bucy, MD   1 year ago Type 2 diabetes mellitus with hyperglycemia, without long-term current use of insulin [facility])   [facility], Dionne Bucy, MD   1 year ago Type 2  diabetes mellitus with hyperglycemia, without long-term current use of insulin [facility])   [facility], Dionne Bucy, MD      Future Appointments            In 1 month Bacigalupo, Dionne Bucy, MD [facility], [facility]           . JANUVIA 100 MG tablet [Pharmacy Med Name: Januvia 100 MG Oral Tablet] 90 tablet 1    Sig: Take 1 tablet by mouth once daily     Endocrinology:  Diabetes - DPP-4 Inhibitors Failed - 11/02/2019  5:34 PM      Failed - Cr in normal range and within 360 days    Creatinine  Date Value Ref Range Status  12/26/2012 1.16 0.60 - 1.30 mg/dL Final   Creatinine, Ser  Date Value Ref Range Status  10/29/2019 1.19 (H) 0.44 - 1.00 mg/dL Final         Passed - HBA1C is between 0 and 7.9 and within 180 days    Hemoglobin A1C  Date Value Ref Range Status  12/25/2012 7.9 (H) 4.2 - 6.3 % Final  Comment:    The American Diabetes Association recommends that a primary goal of therapy should be <7% and that physicians should reevaluate the treatment regimen in patients with HbA1c values consistently >8%.    Hgb A1c MFr Bld  Date Value Ref Range Status  08/18/2019 7.9 (H) 4.8 - 5.6 % Final    Comment:    (NOTE) Pre diabetes:          5.7%-6.4% Diabetes:              >6.4% Glycemic control for   <7.0% adults with diabetes          Passed - Valid encounter within last 6 months    Recent Outpatient Visits          2 months ago Severe persistent asthma without complication   [facility] [place], Dionne Bucy, MD   10 months ago Type 2 diabetes mellitus with hyperglycemia, without long-term current use of insulin [facility])   [facility], Dionne Bucy, MD   12 months ago Type 2 diabetes mellitus with hyperglycemia, without long-term current use of insulin [facility])   [facility] [place], Dionne Bucy, MD   1 year ago Type 2 diabetes mellitus with hyperglycemia, without long-term current use of  insulin [facility])   [facility], Dionne Bucy, MD   1 year ago Type 2 diabetes mellitus with hyperglycemia, without long-term current use of insulin [facility])   [place], Dionne Bucy, MD      Future Appointments            In 1 month Bacigalupo, Dionne Bucy, MD [facility], [facility]

## 2019-11-18 ENCOUNTER — Telehealth (INDEPENDENT_AMBULATORY_CARE_PROVIDER_SITE_OTHER): Payer: Medicaid Other | Admitting: Family Medicine

## 2019-11-18 ENCOUNTER — Other Ambulatory Visit: Payer: Self-pay

## 2019-11-18 ENCOUNTER — Ambulatory Visit: Payer: Self-pay

## 2019-11-18 ENCOUNTER — Telehealth: Payer: Self-pay

## 2019-11-18 DIAGNOSIS — J4551 Severe persistent asthma with (acute) exacerbation: Secondary | ICD-10-CM | POA: Diagnosis not present

## 2019-11-18 DIAGNOSIS — E1165 Type 2 diabetes mellitus with hyperglycemia: Secondary | ICD-10-CM | POA: Diagnosis not present

## 2019-11-18 DIAGNOSIS — R0609 Other forms of dyspnea: Secondary | ICD-10-CM | POA: Insufficient documentation

## 2019-11-18 DIAGNOSIS — R0600 Dyspnea, unspecified: Secondary | ICD-10-CM

## 2019-11-18 MED ORDER — LANTUS SOLOSTAR 100 UNIT/ML ~~LOC~~ SOPN: 10.0000 [IU] | PEN_INJECTOR | Freq: Every day | SUBCUTANEOUS | 1 refills | Status: DC

## 2019-11-18 MED ORDER — PREDNISONE 20 MG PO TABS: 40.0000 mg | ORAL_TABLET | Freq: Every day | ORAL | 0 refills | Status: AC

## 2019-11-18 MED ORDER — NOVOFINE 32G X 6 MM MISC: 2 refills | Status: DC

## 2019-11-18 NOTE — Progress Notes (Signed)
MyChart Video Visit    Virtual Visit via Video Note   This visit type was conducted due to national recommendations for restrictions regarding the COVID-19 Pandemic (e.g. social distancing) in an effort to limit this patient's exposure and mitigate transmission in our community. This patient is at least at moderate risk for complications without adequate follow up. This format is felt to be most appropriate for this patient at this time. Physical exam was limited by quality of the video and audio technology used for the visit.    Patient location: home Provider location: [place] involved in the visit: patient, provider   Patient: Whitney Fleming   DOB: 1946/05/02   74 y.o. Female  MRN: [id number] Visit Date: 11/18/2019  Today's healthcare provider: Lavon Paganini, MD   No chief complaint on file.  Subjective    HPI Daughter interprets for her in [place]. Declines formal interpreter.  Dyspnea on exertion Worse in hot weather No chest pain Ongoing for 1 yr at least Seems to be worsening Not on any home oxygen therapy Recently had an overnight oximetry done with pulmonology which was normal and she was told she did not need overnight oxygen therapy She has had multiple asthma exacerbations and is unable to use traditional inhalers anymore due to her poor lung function She has been wheezing and more short of breath over the last 24 hours that is similar to her asthma exacerbations previously She has good response from prednisone burst when this occurs Last echo was in 2019 and showed normal EF and valvular function She has not seen a cardiologist for this She is followed by pulmonology regularly       Medications: Outpatient Medications Prior to Visit  Medication Sig  . Accu-Chek FastClix Lancets MISC Use as directed to check blood glucose twice daily  . albuterol (ACCUNEB) 1.25 MG/3ML nebulizer solution Take 3 mLs (1.25 mg total) by  nebulization every 4 (four) hours as needed for wheezing.  Marland Kitchen albuterol (PROVENTIL) (2.5 MG/3ML) 0.083% nebulizer solution USE 1 VIAL IN NEBULIZER THREE TIMES DAILY  . AMBULATORY NON FORMULARY MEDICATION Medication Name: Aero chamber use as directed. QP:Y19.509  . Blood Glucose Monitoring Suppl (ACCU-CHEK GUIDE) w/Device KIT USE TO TEST BLOOD SUGAR TWICE DAILY  . budesonide-formoterol (SYMBICORT) 160-4.5 MCG/ACT inhaler Inhale 2 puffs into the lungs 2 (two) times daily. Rinse mouth after use  . carvedilol (COREG) 12.5 MG tablet Take 1 tablet (12.5 mg total) by mouth 2 (two) times daily with a meal.  . dextromethorphan-guaiFENesin (MUCINEX DM) 30-600 MG 12hr tablet Take 1 tablet by mouth 2 (two) times daily.  . empagliflozin (JARDIANCE) 25 MG TABS tablet Take 25 mg by mouth daily.  Marland Kitchen glucose blood (ACCU-CHEK GUIDE) test strip Use as instructed to test blood glucose twice daily  . guaiFENesin (MUCINEX) 600 MG 12 hr tablet Take 1 tablet (600 mg total) by mouth 2 (two) times daily.  Marland Kitchen JANUVIA 100 MG tablet Take 1 tablet by mouth once daily  . losartan (COZAAR) 50 MG tablet Take 1 tablet (50 mg total) by mouth daily.  . metFORMIN (GLUCOPHAGE) 1000 MG tablet Take 1 tablet (1,000 mg total) by mouth 2 (two) times daily with a meal.  . montelukast (SINGULAIR) 10 MG tablet Take 10 mg by mouth at bedtime.  . rosuvastatin (CRESTOR) 5 MG tablet Take 1 tablet by mouth once daily  . [DISCONTINUED] insulin glargine (LANTUS SOLOSTAR) 100 UNIT/ML Solostar Pen Inject 10 Units into the skin daily.  . [  DISCONTINUED] Insulin Pen Needle (NOVOFINE) 32G X 6 MM MISC Use as directed to inject insulin daily  . [DISCONTINUED] predniSONE (DELTASONE) 20 MG tablet Take 2 tablets (40 mg total) by mouth daily.   No facility-administered medications prior to visit.    Review of Systems  Constitutional: Negative.   Respiratory: Positive for shortness of breath and wheezing. Negative for apnea, choking and chest tightness.     Cardiovascular: Negative.   Gastrointestinal: Negative.   Musculoskeletal: Positive for arthralgias and back pain.  Neurological: Negative.   Psychiatric/Behavioral: Negative.       Objective    There were no vitals taken for this visit.   Physical Exam Constitutional:      General: She is not in acute distress. Pulmonary:     Effort: Pulmonary effort is normal. No respiratory distress.  Neurological:     Mental Status: She is alert. Mental status is at baseline.  Psychiatric:        Behavior: Behavior normal.        Assessment & Plan     Problem List Items Addressed This Visit      Respiratory   Asthma exacerbation    Patient with severe persistent asthma with frequent exacerbations Symptoms currently consistent with another exacerbation We will treat with prednisone burst x7 days Advised to continue inhalers as prescribed by pulmonology Discussed strict emergent/return precautions if she worsens or does not improve      Relevant Medications   predniSONE (DELTASONE) 20 MG tablet     Endocrine   Diabetes ([place])    Patient has had multiple courses of prednisone over the last year and this seems to worsen her blood sugar control Continue current oral medications Continue Lantus 10 units daily      Relevant Medications   insulin glargine (LANTUS SOLOSTAR) 100 UNIT/ML Solostar Pen     Other   Dyspnea on exertion - Primary    Longstanding issue Followed by pulmonology Likely multifactorial given severe persistent asthma, obesity Concern also for possible undiagnosed CAD or CHF We will get echo Will send home health nurse to her home to do walking O2 test to determine need for daytime oxygen She should continue her maintenance therapy for her asthma as prescribed by pulmonology Referral to cardiology for evaluation for possible CAD Return/emergent precautions discussed      Relevant Orders   ECHOCARDIOGRAM COMPLETE   Ambulatory referral to [place] referral to Cardiology       Return for as scheduled.     I discussed the assessment and treatment plan with the patient. The patient was provided an opportunity to ask questions and all were answered. The patient agreed with the plan and demonstrated an understanding of the instructions.   The patient was advised to call back or seek an in-person evaluation if the symptoms worsen or if the condition fails to improve as anticipated.  I, Lavon Paganini, MD, have reviewed all documentation for this visit. The documentation on 11/18/19 for the exam, diagnosis, procedures, and orders are all accurate and complete.   Huey Scalia, Dionne Bucy, MD, MPH [facility]

## 2019-11-18 NOTE — Assessment & Plan Note (Signed)
Patient has had multiple courses of prednisone over the last year and this seems to worsen her blood sugar control Continue current oral medications Continue Lantus 10 units daily

## 2019-11-18 NOTE — Telephone Encounter (Signed)
Assisted by [facility], Interpreter # [phone]; left message on pt's home voicemail; interpereter states there is a poor phone connection on his end; unable to attempt to contact alternate number.

## 2019-11-18 NOTE — Assessment & Plan Note (Signed)
Longstanding issue Followed by pulmonology Likely multifactorial given severe persistent asthma, obesity Concern also for possible undiagnosed CAD or CHF We will get echo Will send home health nurse to her home to do walking O2 test to determine need for daytime oxygen She should continue her maintenance therapy for her asthma as prescribed by pulmonology Referral to cardiology for evaluation for possible CAD Return/emergent precautions discussed

## 2019-11-18 NOTE — Assessment & Plan Note (Signed)
Patient with severe persistent asthma with frequent exacerbations Symptoms currently consistent with another exacerbation We will treat with prednisone burst x7 days Advised to continue inhalers as prescribed by pulmonology Discussed strict emergent/return precautions if she worsens or does not improve

## 2019-11-18 NOTE — Telephone Encounter (Signed)
Virtual appointment today.

## 2019-11-18 NOTE — Telephone Encounter (Signed)
Attempted to contact daughter.  She is patients interpreter per DPR. Patient has virtual appointment scheduled today. Calling for prednisone for breathing issues. Left VM requesting  that daughter call office to speak with nurse about mothers symptoms.

## 2019-11-18 NOTE — Telephone Encounter (Signed)
Copied from [place] [phone]. Topic: General - Other >> Nov 18, 2019 12:37 PM Yvette Rack wrote: Reason for CRM: Pt daughter Malachi Paradise returned call to office. Cb# [phone]

## 2019-11-18 NOTE — Telephone Encounter (Signed)
Called patient daughters cell number.She is patients interpreter. Patient has virtual appointment scheduled today. Calling for prednisone for breathing issues. Request that daughter call office to speak with nurse about her mothers symptoms.

## 2019-12-01 ENCOUNTER — Telehealth: Payer: Self-pay | Admitting: Family Medicine

## 2019-12-01 NOTE — Telephone Encounter (Signed)
Can you send to a different agency please?

## 2019-12-01 NOTE — Telephone Encounter (Signed)
[facility] is unable to staff this referral for home health nursing/ Pt will need a referral to another agency / please advise

## 2019-12-02 ENCOUNTER — Ambulatory Visit: Payer: Self-pay | Admitting: Family Medicine

## 2019-12-11 ENCOUNTER — Ambulatory Visit
Admission: RE | Admit: 2019-12-11 | Discharge: 2019-12-11 | Disposition: A | Discharge status: Home / Self Care | Payer: Medicaid Other | Source: Ambulatory Visit | Attending: Family Medicine | Admitting: Family Medicine

## 2019-12-11 ENCOUNTER — Other Ambulatory Visit: Payer: Self-pay

## 2019-12-11 DIAGNOSIS — K219 Gastro-esophageal reflux disease without esophagitis: Secondary | ICD-10-CM | POA: Diagnosis not present

## 2019-12-11 DIAGNOSIS — Z9181 History of falling: Secondary | ICD-10-CM | POA: Diagnosis not present

## 2019-12-11 DIAGNOSIS — Z7951 Long term (current) use of inhaled steroids: Secondary | ICD-10-CM | POA: Diagnosis not present

## 2019-12-11 DIAGNOSIS — R0609 Other forms of dyspnea: Secondary | ICD-10-CM

## 2019-12-11 DIAGNOSIS — E1169 Type 2 diabetes mellitus with other specified complication: Secondary | ICD-10-CM | POA: Diagnosis not present

## 2019-12-11 DIAGNOSIS — J4551 Severe persistent asthma with (acute) exacerbation: Secondary | ICD-10-CM | POA: Diagnosis not present

## 2019-12-11 DIAGNOSIS — R0600 Dyspnea, unspecified: Secondary | ICD-10-CM | POA: Diagnosis not present

## 2019-12-11 DIAGNOSIS — J9690 Respiratory failure, unspecified, unspecified whether with hypoxia or hypercapnia: Secondary | ICD-10-CM | POA: Diagnosis not present

## 2019-12-11 DIAGNOSIS — E785 Hyperlipidemia, unspecified: Secondary | ICD-10-CM | POA: Diagnosis not present

## 2019-12-11 DIAGNOSIS — J449 Chronic obstructive pulmonary disease, unspecified: Secondary | ICD-10-CM | POA: Diagnosis not present

## 2019-12-11 DIAGNOSIS — Z794 Long term (current) use of insulin: Secondary | ICD-10-CM | POA: Diagnosis not present

## 2019-12-11 DIAGNOSIS — Z7952 Long term (current) use of systemic steroids: Secondary | ICD-10-CM | POA: Diagnosis not present

## 2019-12-11 DIAGNOSIS — I10 Essential (primary) hypertension: Secondary | ICD-10-CM | POA: Diagnosis not present

## 2019-12-11 NOTE — Progress Notes (Signed)
*  PRELIMINARY RESULTS* Echocardiogram 2D Echocardiogram has been performed.  Whitney Fleming 12/11/2019, 11:54 AM

## 2019-12-13 IMAGING — CT CT ANGIO CHEST
2 of 6 series · 18 of 36 positions shown · IV contrast (APPLIED)
Comparison: Chest radiograph dated 02/28/2018 and CT dated
12/23/2012

CLINICAL DATA: 71-year-old female with shortness of breath. Chest
pain.

EXAM:
CT ANGIOGRAPHY CHEST WITH CONTRAST
TECHNIQUE: Multidetector CT imaging of the chest was performed using the
standard protocol during bolus administration of intravenous
contrast. Multiplanar CT image reconstructions and MIPs were
obtained to evaluate the vascular anatomy.
CONTRAST:  60mL OMNIPAQUE IOHEXOL 350 MG/ML SOLN

[Series 5: thins · axial · 0.66mm/px · z∈[-525,-272]mm · 17 of 283 slices shown]
[im 15/283  lung]
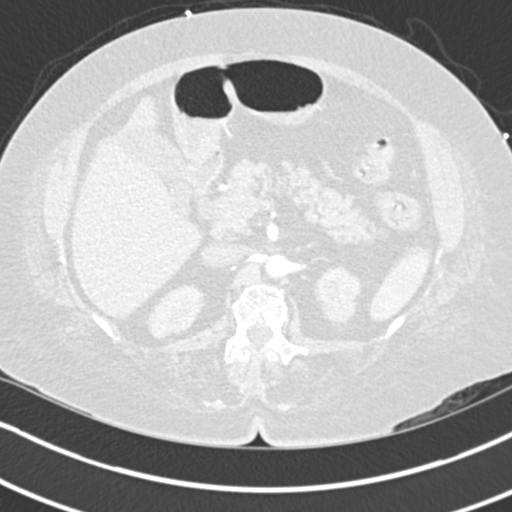
[im 29/283  mediastinal]
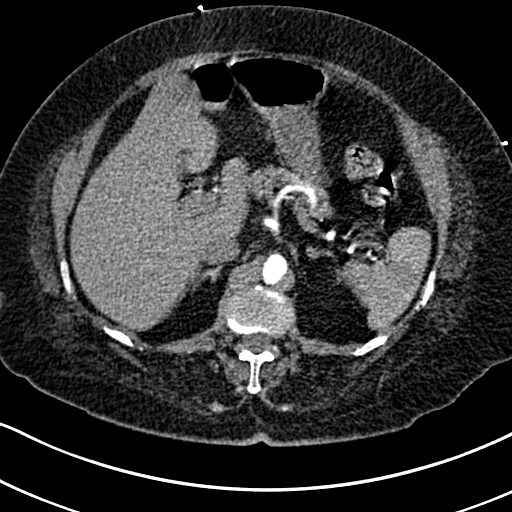
[im 43/283  lung]
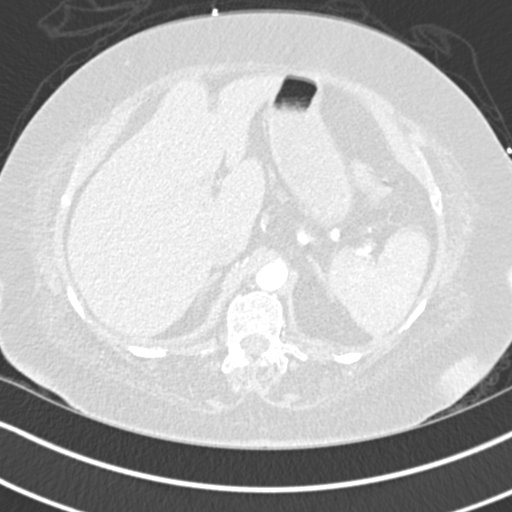
[im 57/283  mediastinal]
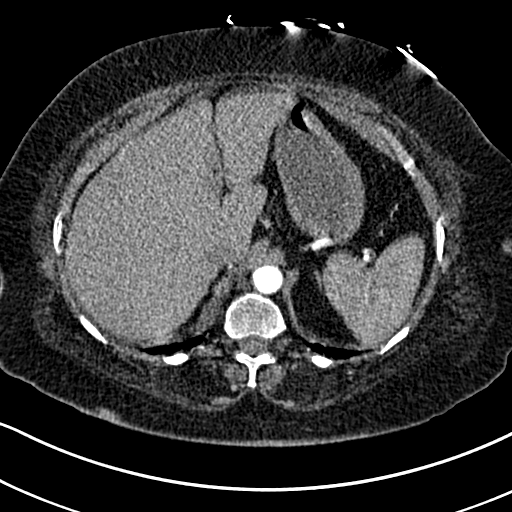
[im 85/283  lung]
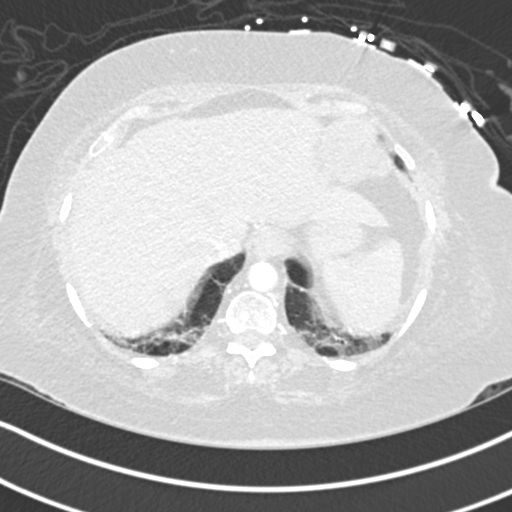
[im 99/283  mediastinal]
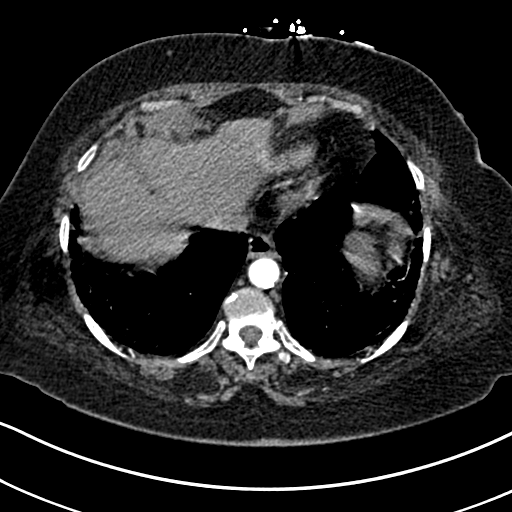
[im 113/283  lung]
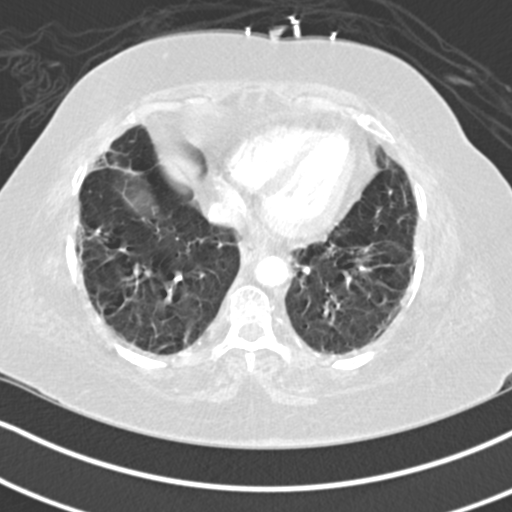
[im 127/283  mediastinal]
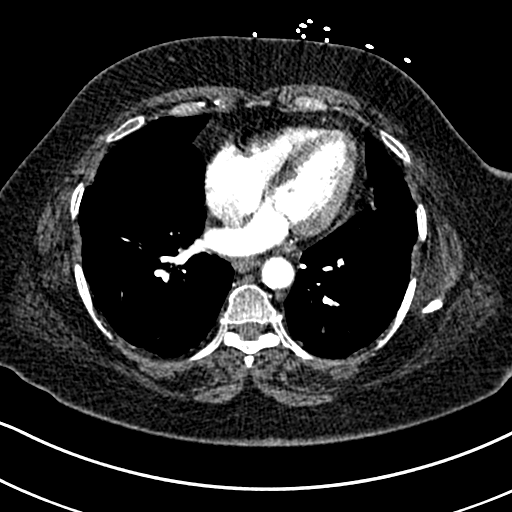
[im 142/283  lung]
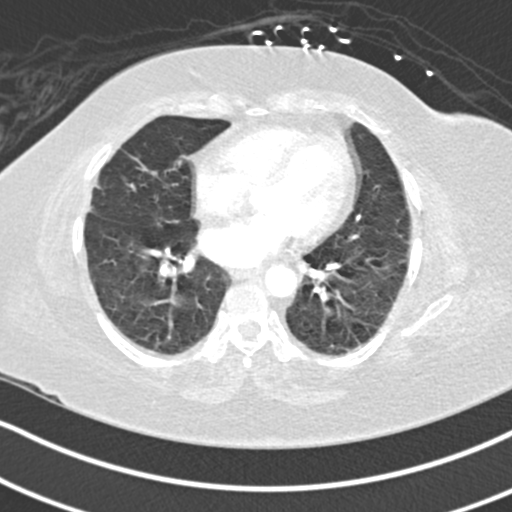
[im 156/283  mediastinal]
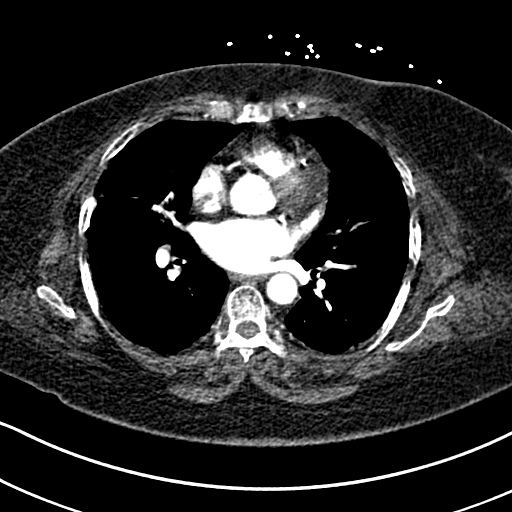
[im 170/283  lung]
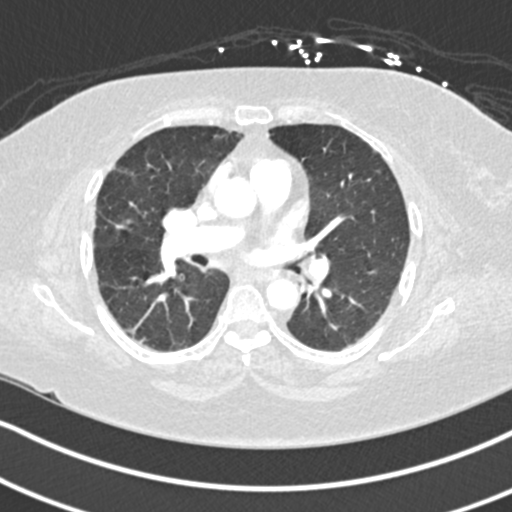
[im 184/283  mediastinal]
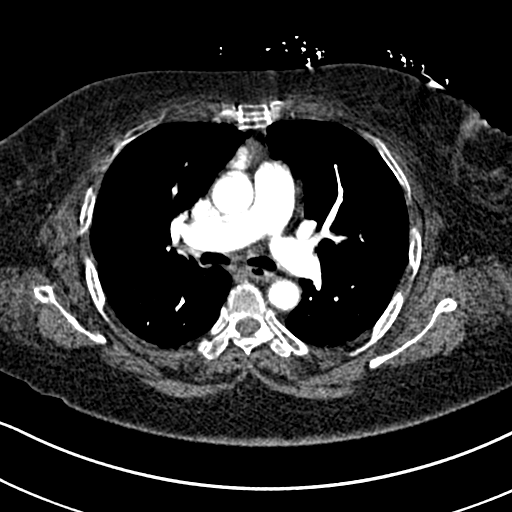
[im 198/283  lung]
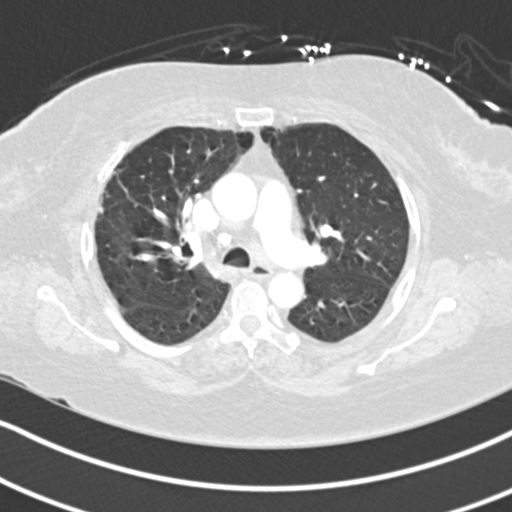
[im 226/283  mediastinal]
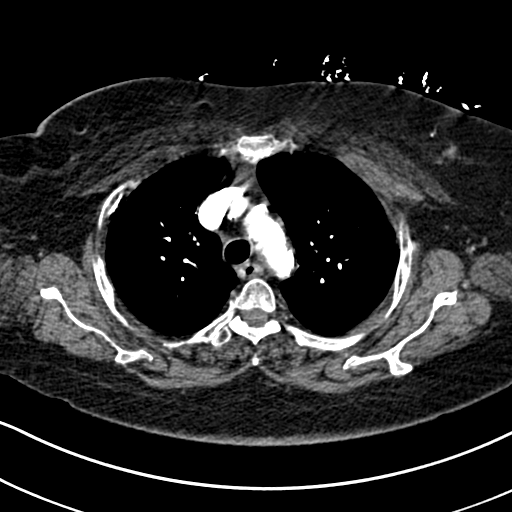
[im 240/283  lung]
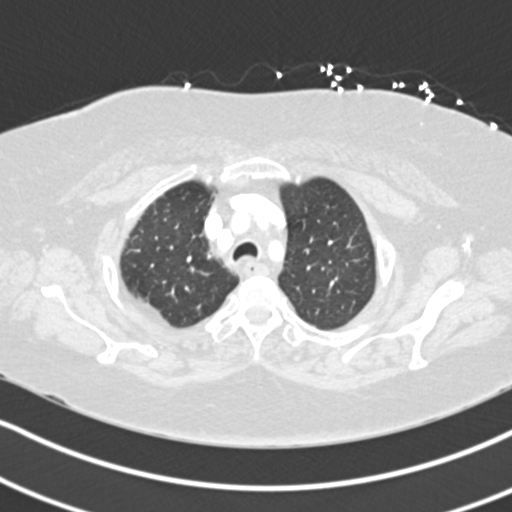
[im 254/283  mediastinal]
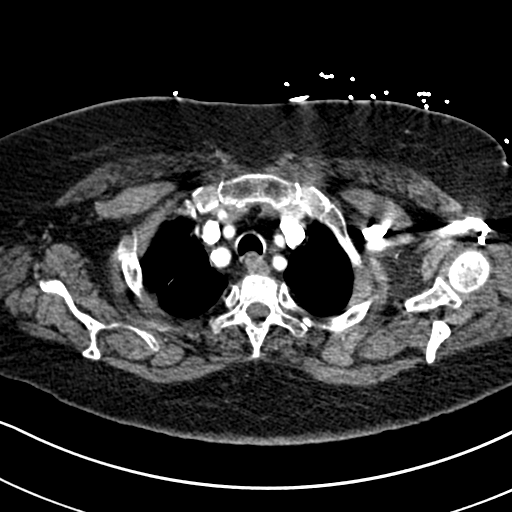
[im 268/283  lung]
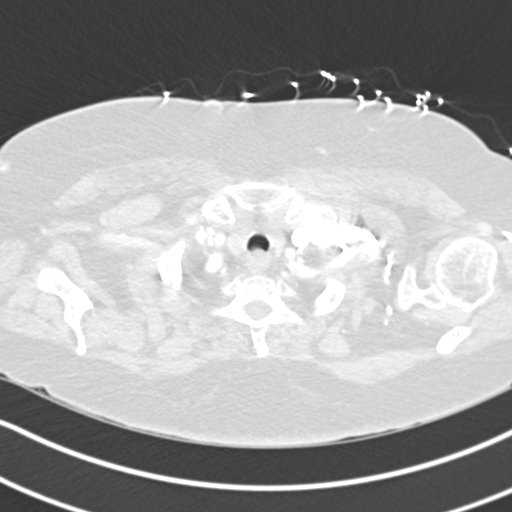

[Series 7: coronal mpr · coronal · 0.55mm/px · 1 of 85 slices shown]
[im 43/85  mediastinal]
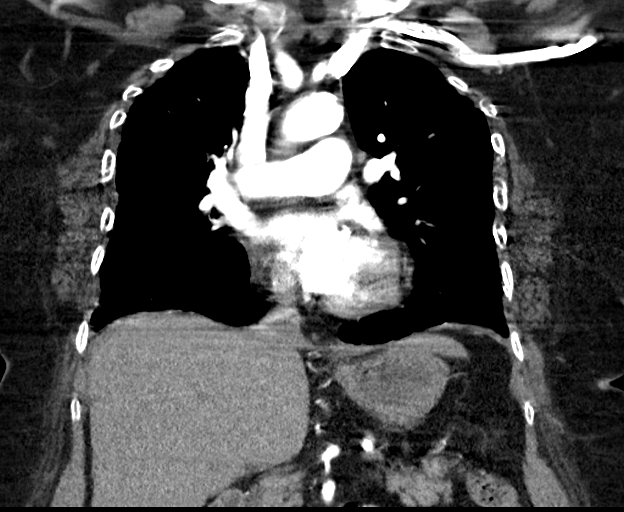

[18 of 36 positions shown; findings below may reference images not displayed]

FINDINGS: Cardiovascular: There is no cardiomegaly or pericardial effusion.
Coronary vascular calcification noted. There is moderate
atherosclerotic calcification of the thoracic aorta. No aneurysmal
dilatation or dissection. The origins of the great vessels of the
aortic arch are patent. Evaluation of the pulmonary arteries is
limited due to respiratory motion artifact. No definite large or
central pulmonary artery embolus identified. Nonocclusive apparent
linear hypodensity in the periphery of a right lower lobe
subsegmental pulmonary artery branch (series 5, image 139) is most
likely artifactual. This is less likely related to an old
infarct/scarring. Evaluation of this area is limited due to motion
artifact.

Mediastinum/Nodes: There is no hilar or mediastinal adenopathy.
Esophagus and the thyroid gland are grossly unremarkable. No
mediastinal fluid collection. Calcified granuloma noted in the
mediastinum.

Lungs/Pleura: There is bibasilar subpleural interstitial
coarsening/fibrosis. No focal consolidation, pleural effusion, or
pneumothorax. The central airways are patent.

Upper Abdomen: There is a small stone within the gallbladder. No
pericholecystic fluid. The visualized upper abdomen is otherwise
unremarkable.

Musculoskeletal: Age indeterminate L1 compression fracture, likely
old. Clinical correlation is recommended. No acute fracture.

Review of the MIP images confirms the above findings.
IMPRESSION: 1. No acute intrathoracic pathology. No CT evidence of central
pulmonary artery embolus.
2. Bibasilar interstitial coarsening/fibrosis.
3.  Aortic Atherosclerosis ([id number]).

## 2019-12-15 ENCOUNTER — Other Ambulatory Visit: Payer: Self-pay | Admitting: Family Medicine

## 2019-12-15 NOTE — Telephone Encounter (Signed)
Requested medications are due for refill today?  Yes  Requested medications are on active medication list?  Yes  Last Refill:  11/05/2018  # 90 with one refill  Future visit scheduled?  Yes in 4 months.  Notes to Clinic:   Medication failed RX refill protocol due to no LDL value in the past 360 days.  Last LDL obtained on 01/17/2018.

## 2019-12-15 NOTE — Telephone Encounter (Signed)
Requested Prescriptions  °Pending Prescriptions Disp Refills  °• metFORMIN (GLUCOPHAGE) 1000 MG tablet [Pharmacy Med Name: metFORMIN HCl 1000 MG Oral Tablet] 180 tablet 0  °  Sig: TAKE 1 TABLET BY MOUTH TWICE DAILY WITH MEALS  °  ° Endocrinology:  Diabetes - Biguanides Failed - 12/15/2019  7:41 PM  °  °  Failed - Cr in normal range and within 360 days  °  Creatinine  °Date Value Ref Range Status  °12/26/2012 1.16 0.60 - 1.30 mg/dL Final  ° °Creatinine, Ser  °Date Value Ref Range Status  °10/29/2019 1.19 (H) 0.44 - 1.00 mg/dL Final  °   °  °  Failed - eGFR in normal range and within 360 days  °  EGFR (African American)  °Date Value Ref Range Status  °12/26/2012 56 (L)  Final  ° °GFR calc Af Amer  °Date Value Ref Range Status  °10/29/2019 52 (L) >60 mL/min Final  ° °EGFR (Non-African Amer.)  °Date Value Ref Range Status  °12/26/2012 48 (L)  Final  °  Comment:  °  eGFR values <60mL/min/1.73 m2 may be an indication of chronic °kidney disease (CKD). °Calculated eGFR is useful in patients with stable renal function. °The eGFR calculation will not be reliable in acutely ill patients °when serum creatinine is changing rapidly. It is not useful in  °patients on dialysis. The eGFR calculation may not be applicable °to patients at the low and high extremes of body sizes, pregnant °women, and vegetarians. °CALCIUM - RESULTS VERIFIED BY REPEAT TESTING. ° - NOTIFIED OF CRITICAL VALUE ° - C/BRANDY KELLY AT 0715 12/26/12-LAB ° - READ-BACK PROCESS PERFORMED. °  ° °GFR calc non Af Amer  °Date Value Ref Range Status  °10/29/2019 45 (L) >60 mL/min Final  °   °  °  Passed - HBA1C is between 0 and 7.9 and within 180 days  °  Hemoglobin A1C  °Date Value Ref Range Status  °12/25/2012 7.9 (H) 4.2 - 6.3 % Final  °  Comment:  °  The American Diabetes Association recommends that a primary goal of °therapy should be <7% and that physicians should reevaluate the °treatment regimen in patients with HbA1c values consistently >8%. °  ° °Hgb A1c MFr  Bld  °Date Value Ref Range Status  °08/18/2019 7.9 (H) 4.8 - 5.6 % Final  °  Comment:  °  (NOTE) °Pre diabetes:          5.7%-6.4% °Diabetes:              >6.4% °Glycemic control for   <7.0% °adults with diabetes °  °   °  °  Passed - Valid encounter within last 6 months  °  Recent Outpatient Visits   °      ° 3 weeks ago Dyspnea on exertion  ° [facility] Bacigalupo, Angela M, MD  ° 3 months ago Severe persistent asthma without complication  ° [facility] Bacigalupo, Angela M, MD  ° 11 months ago Type 2 diabetes mellitus with hyperglycemia, without long-term current use of insulin (HCC)  ° [facility] Bacigalupo, Angela M, MD  ° 1 year ago Type 2 diabetes mellitus with hyperglycemia, without long-term current use of insulin (HCC)  ° [facility] Bacigalupo, Angela M, MD  ° 1 year ago Type 2 diabetes mellitus with hyperglycemia, without long-term current use of insulin (HCC)  °  [facility] Bacigalupo, Angela M, MD  °  °  °Future Appointments   °        °   In 4 months Bacigalupo, Dionne Bucy, MD [facility], [facility]            JARDIANCE 25 MG TABS tablet [Pharmacy Med Name: Jardiance 85 MG Oral Tablet] 90 tablet 0    Sig: Take 1 tablet by mouth once daily     Endocrinology:  Diabetes - SGLT2 Inhibitors Failed - 12/15/2019  7:41 PM      Failed - Cr in normal range and within 360 days    Creatinine  Date Value Ref Range Status  12/26/2012 1.16 0.60 - 1.30 mg/dL Final   Creatinine, Ser  Date Value Ref Range Status  10/29/2019 1.19 (H) 0.44 - 1.00 mg/dL Final         Failed - LDL in normal range and within 360 days    Ldl Cholesterol, Calc  Date Value Ref Range Status  12/25/2012 36 0 - 100 mg/dL Final   LDL Calculated  Date Value Ref Range Status  01/17/2018 52 0 - 99 mg/dL Final         Failed - eGFR in normal range and within 360 days    EGFR (African American)  Date Value Ref Range Status  12/26/2012 56  (L)  Final   GFR calc Af Amer  Date Value Ref Range Status  10/29/2019 52 (L) >60 mL/min Final   EGFR (Non-African Amer.)  Date Value Ref Range Status  12/26/2012 48 (L)  Final    Comment:    eGFR values <74m/min/1.73 m2 may be an indication of chronic kidney disease (CKD). Calculated eGFR is useful in patients with stable renal function. The eGFR calculation will not be reliable in acutely ill patients when serum creatinine is changing rapidly. It is not useful in  patients on dialysis. The eGFR calculation may not be applicable to patients at the low and high extremes of body sizes, pregnant women, and vegetarians. CALCIUM - RESULTS VERIFIED BY REPEAT TESTING.  - NOTIFIED OF CRITICAL VALUE  - C/BRANDY KELLY AT 0715 12/26/12-LAB  - READ-BACK PROCESS PERFORMED.    GFR calc non Af Amer  Date Value Ref Range Status  10/29/2019 45 (L) >60 mL/min Final         Passed - HBA1C is between 0 and 7.9 and within 180 days    Hemoglobin A1C  Date Value Ref Range Status  12/25/2012 7.9 (H) 4.2 - 6.3 % Final    Comment:    The American Diabetes Association recommends that a primary goal of therapy should be <7% and that physicians should reevaluate the treatment regimen in patients with HbA1c values consistently >8%.    Hgb A1c MFr Bld  Date Value Ref Range Status  08/18/2019 7.9 (H) 4.8 - 5.6 % Final    Comment:    (NOTE) Pre diabetes:          5.7%-6.4% Diabetes:              >6.4% Glycemic control for   <7.0% adults with diabetes          Passed - Valid encounter within last 6 months    Recent Outpatient Visits          3 weeks ago [facility]   3 months ago [facility] persistent [facility]   11 months ago Type 2 diabetes mellitus with hyperglycemia, without long-term current use of insulin ([facility] MD   1 year ago Type 2 diabetes mellitus with hyperglycemia, without long-term current use of insulin [facility])   [facility], Dionne Bucy, MD   1 year ago Type 2 diabetes mellitus with hyperglycemia, without long-term current use of insulin [facility])   [facility] Bacigalupo, Dionne Bucy, MD      Future Appointments            In 4 months Bacigalupo, Dionne Bucy, MD [facility], [place]

## 2019-12-18 ENCOUNTER — Telehealth: Payer: Self-pay

## 2019-12-18 DIAGNOSIS — E785 Hyperlipidemia, unspecified: Secondary | ICD-10-CM | POA: Diagnosis not present

## 2019-12-18 DIAGNOSIS — J4551 Severe persistent asthma with (acute) exacerbation: Secondary | ICD-10-CM | POA: Diagnosis not present

## 2019-12-18 DIAGNOSIS — J449 Chronic obstructive pulmonary disease, unspecified: Secondary | ICD-10-CM | POA: Diagnosis not present

## 2019-12-18 DIAGNOSIS — Z794 Long term (current) use of insulin: Secondary | ICD-10-CM | POA: Diagnosis not present

## 2019-12-18 DIAGNOSIS — Z7952 Long term (current) use of systemic steroids: Secondary | ICD-10-CM | POA: Diagnosis not present

## 2019-12-18 DIAGNOSIS — E1169 Type 2 diabetes mellitus with other specified complication: Secondary | ICD-10-CM | POA: Diagnosis not present

## 2019-12-18 DIAGNOSIS — K219 Gastro-esophageal reflux disease without esophagitis: Secondary | ICD-10-CM | POA: Diagnosis not present

## 2019-12-18 DIAGNOSIS — Z7951 Long term (current) use of inhaled steroids: Secondary | ICD-10-CM | POA: Diagnosis not present

## 2019-12-18 DIAGNOSIS — I10 Essential (primary) hypertension: Secondary | ICD-10-CM | POA: Diagnosis not present

## 2019-12-18 DIAGNOSIS — J9690 Respiratory failure, unspecified, unspecified whether with hypoxia or hypercapnia: Secondary | ICD-10-CM | POA: Diagnosis not present

## 2019-12-18 DIAGNOSIS — Z9181 History of falling: Secondary | ICD-10-CM | POA: Diagnosis not present

## 2019-12-18 NOTE — Telephone Encounter (Signed)
Patient's daughter advised as below. She will also call cardiology for an appointment. Dr. Brita Romp referred her to cardio.

## 2019-12-18 NOTE — Telephone Encounter (Signed)
-----   Message from Mar Daring, [place] sent at 12/18/2019  1:05 PM EDT ----- Echocardiogram is fairly unremarkable. Does note to have some mild heart failure they graded as grade 1 diastolic dysfunction. Can schedule appt with another provider or schedule with Dr. B in future to discuss further.

## 2019-12-18 NOTE — Telephone Encounter (Signed)
If she has been referred to cardiology, she can follow up with them

## 2019-12-21 ENCOUNTER — Telehealth: Payer: Self-pay | Admitting: Physician Assistant

## 2019-12-21 DIAGNOSIS — Z7952 Long term (current) use of systemic steroids: Secondary | ICD-10-CM | POA: Diagnosis not present

## 2019-12-21 DIAGNOSIS — E785 Hyperlipidemia, unspecified: Secondary | ICD-10-CM | POA: Diagnosis not present

## 2019-12-21 DIAGNOSIS — J4551 Severe persistent asthma with (acute) exacerbation: Secondary | ICD-10-CM | POA: Diagnosis not present

## 2019-12-21 DIAGNOSIS — Z7951 Long term (current) use of inhaled steroids: Secondary | ICD-10-CM | POA: Diagnosis not present

## 2019-12-21 DIAGNOSIS — K219 Gastro-esophageal reflux disease without esophagitis: Secondary | ICD-10-CM | POA: Diagnosis not present

## 2019-12-21 DIAGNOSIS — J9690 Respiratory failure, unspecified, unspecified whether with hypoxia or hypercapnia: Secondary | ICD-10-CM | POA: Diagnosis not present

## 2019-12-21 DIAGNOSIS — Z794 Long term (current) use of insulin: Secondary | ICD-10-CM | POA: Diagnosis not present

## 2019-12-21 DIAGNOSIS — J449 Chronic obstructive pulmonary disease, unspecified: Secondary | ICD-10-CM | POA: Diagnosis not present

## 2019-12-21 DIAGNOSIS — E1169 Type 2 diabetes mellitus with other specified complication: Secondary | ICD-10-CM | POA: Diagnosis not present

## 2019-12-21 DIAGNOSIS — I10 Essential (primary) hypertension: Secondary | ICD-10-CM | POA: Diagnosis not present

## 2019-12-21 DIAGNOSIS — Z9181 History of falling: Secondary | ICD-10-CM | POA: Diagnosis not present

## 2019-12-21 NOTE — Telephone Encounter (Signed)
PT recert signed and placed for fax.

## 2019-12-28 ENCOUNTER — Other Ambulatory Visit: Payer: Self-pay

## 2019-12-28 NOTE — Telephone Encounter (Signed)
CREATED IN ERROR

## 2019-12-29 ENCOUNTER — Other Ambulatory Visit: Payer: Self-pay

## 2019-12-29 MED ORDER — BUDESONIDE 0.5 MG/2ML IN SUSP
0.5000 mg | Freq: Two times a day (BID) | RESPIRATORY_TRACT | 2 refills | Status: DC
Start: 2019-12-29 — End: 2020-04-28

## 2019-12-29 NOTE — Telephone Encounter (Signed)
Rx for pulmicort 0.5 has been sent to [facility], as requested by pharmacy.  Nothing further is needed.

## 2019-12-30 ENCOUNTER — Other Ambulatory Visit: Payer: Self-pay | Admitting: Family Medicine

## 2019-12-30 DIAGNOSIS — J9690 Respiratory failure, unspecified, unspecified whether with hypoxia or hypercapnia: Secondary | ICD-10-CM | POA: Diagnosis not present

## 2019-12-30 DIAGNOSIS — J4551 Severe persistent asthma with (acute) exacerbation: Secondary | ICD-10-CM | POA: Diagnosis not present

## 2019-12-30 DIAGNOSIS — E1169 Type 2 diabetes mellitus with other specified complication: Secondary | ICD-10-CM | POA: Diagnosis not present

## 2019-12-30 DIAGNOSIS — I10 Essential (primary) hypertension: Secondary | ICD-10-CM | POA: Diagnosis not present

## 2019-12-30 DIAGNOSIS — E785 Hyperlipidemia, unspecified: Secondary | ICD-10-CM | POA: Diagnosis not present

## 2019-12-30 DIAGNOSIS — Z794 Long term (current) use of insulin: Secondary | ICD-10-CM | POA: Diagnosis not present

## 2019-12-30 DIAGNOSIS — Z7951 Long term (current) use of inhaled steroids: Secondary | ICD-10-CM | POA: Diagnosis not present

## 2019-12-30 DIAGNOSIS — Z9181 History of falling: Secondary | ICD-10-CM | POA: Diagnosis not present

## 2019-12-30 DIAGNOSIS — K219 Gastro-esophageal reflux disease without esophagitis: Secondary | ICD-10-CM | POA: Diagnosis not present

## 2019-12-30 DIAGNOSIS — Z7952 Long term (current) use of systemic steroids: Secondary | ICD-10-CM | POA: Diagnosis not present

## 2019-12-30 DIAGNOSIS — J449 Chronic obstructive pulmonary disease, unspecified: Secondary | ICD-10-CM | POA: Diagnosis not present

## 2020-01-06 DIAGNOSIS — E1169 Type 2 diabetes mellitus with other specified complication: Secondary | ICD-10-CM | POA: Diagnosis not present

## 2020-01-06 DIAGNOSIS — I10 Essential (primary) hypertension: Secondary | ICD-10-CM | POA: Diagnosis not present

## 2020-01-06 DIAGNOSIS — Z7951 Long term (current) use of inhaled steroids: Secondary | ICD-10-CM | POA: Diagnosis not present

## 2020-01-06 DIAGNOSIS — J449 Chronic obstructive pulmonary disease, unspecified: Secondary | ICD-10-CM | POA: Diagnosis not present

## 2020-01-06 DIAGNOSIS — K219 Gastro-esophageal reflux disease without esophagitis: Secondary | ICD-10-CM | POA: Diagnosis not present

## 2020-01-06 DIAGNOSIS — J9690 Respiratory failure, unspecified, unspecified whether with hypoxia or hypercapnia: Secondary | ICD-10-CM | POA: Diagnosis not present

## 2020-01-06 DIAGNOSIS — Z794 Long term (current) use of insulin: Secondary | ICD-10-CM | POA: Diagnosis not present

## 2020-01-06 DIAGNOSIS — J4551 Severe persistent asthma with (acute) exacerbation: Secondary | ICD-10-CM | POA: Diagnosis not present

## 2020-01-06 DIAGNOSIS — E785 Hyperlipidemia, unspecified: Secondary | ICD-10-CM | POA: Diagnosis not present

## 2020-01-06 DIAGNOSIS — Z7952 Long term (current) use of systemic steroids: Secondary | ICD-10-CM | POA: Diagnosis not present

## 2020-01-06 DIAGNOSIS — Z9181 History of falling: Secondary | ICD-10-CM | POA: Diagnosis not present

## 2020-01-08 ENCOUNTER — Other Ambulatory Visit: Payer: Self-pay

## 2020-01-08 ENCOUNTER — Encounter: Payer: Self-pay | Admitting: Cardiovascular Disease

## 2020-01-08 ENCOUNTER — Ambulatory Visit (INDEPENDENT_AMBULATORY_CARE_PROVIDER_SITE_OTHER): Payer: Medicaid Other | Admitting: Cardiovascular Disease

## 2020-01-08 VITALS — BP 110/52 | HR 82 | Ht 61.0 in | Wt 158.0 lb

## 2020-01-08 DIAGNOSIS — R0609 Other forms of dyspnea: Secondary | ICD-10-CM

## 2020-01-08 DIAGNOSIS — I209 Angina pectoris, unspecified: Secondary | ICD-10-CM | POA: Diagnosis not present

## 2020-01-08 DIAGNOSIS — I739 Peripheral vascular disease, unspecified: Secondary | ICD-10-CM

## 2020-01-08 DIAGNOSIS — I25118 Atherosclerotic heart disease of native coronary artery with other forms of angina pectoris: Secondary | ICD-10-CM | POA: Diagnosis not present

## 2020-01-08 DIAGNOSIS — R0600 Dyspnea, unspecified: Secondary | ICD-10-CM | POA: Diagnosis not present

## 2020-01-08 MED ORDER — ASPIRIN EC 81 MG PO TBEC
81.0000 mg | DELAYED_RELEASE_TABLET | Freq: Every day | ORAL | 3 refills | Status: AC
Start: 2020-01-08 — End: ?

## 2020-01-08 NOTE — Patient Instructions (Addendum)
Medication Instructions:  Please start aspirin 81 mg daily, coated   If you need a refill on your cardiac medications before your next appointment, please call your pharmacy.    Lab work: No new labs needed   If you have labs (blood work) drawn today and your tests are completely normal, you will receive your results only by: Marland Kitchen MyChart Message (if you have MyChart) OR . A paper copy in the mail If you have any lab test that is abnormal or we need to change your treatment, we will call you to review the results.   Testing/Procedures: Leane Call for [employer] MYOVIEW  Your caregiver has ordered a Stress Test with nuclear imaging. The purpose of this test is to evaluate the blood supply to your heart muscle. This procedure is referred to as a "Non-Invasive Stress Test." This is because other than having an IV started in your vein, nothing is inserted or "invades" your body. Cardiac stress tests are done to find areas of poor blood flow to the heart by determining the extent of coronary artery disease (CAD). Some patients exercise on a treadmill, which naturally increases the blood flow to your heart, while others who are  unable to walk on a treadmill due to physical limitations have a pharmacologic/chemical stress agent called Lexiscan . This medicine will mimic walking on a treadmill by temporarily increasing your coronary blood flow.   Please note: these test may take anywhere between 2-4 hours to complete  PLEASE REPORT TO [place] WILL DIRECT YOU WHERE TO GO  Date of Procedure:_____________________________________  Arrival Time for Procedure:______________________________  Instructions regarding medication:   _XX___ : Hold diabetes medication morning of procedure  _XX___:  Hold carvedilol the night before procedure and morning of procedure  _XX___:  DECREASE Insulin by half the night before and NO insulin the  morning of your procedure.  PLEASE NOTIFY THE OFFICE AT LEAST 61 HOURS IN ADVANCE IF YOU ARE UNABLE TO KEEP YOUR APPOINTMENT.  [phone] AND  PLEASE NOTIFY NUCLEAR MEDICINE AT [facility] AT LEAST 24 HOURS IN ADVANCE IF YOU ARE UNABLE TO KEEP YOUR APPOINTMENT. [phone]  How to prepare for your Myoview test:  1. Do not eat or drink after midnight 2. No caffeine for 24 hours prior to test 3. No smoking 24 hours prior to test. 4. Your medication may be taken with water.  If your doctor stopped a medication because of this test, do not take that medication. 5. Ladies, please do not wear dresses.  Skirts or pants are appropriate. Please wear a short sleeve shirt. 6. No perfume, cologne or lotion. 7. Wear comfortable walking shoes. No heels!   Follow-Up: At [facility], you and your health needs are our priority.  As part of our continuing mission to provide you with exceptional heart care, we have created designated Provider Care Teams.  These Care Teams include your primary Cardiologist (physician) and Advanced Practice Providers (APPs -  Physician Assistants and Nurse Practitioners) who all work together to provide you with the care you need, when you need it.  . You will need a follow up appointment as needed   . Providers on your designated Care Team:   . Murray Hodgkins, NP . Christell Faith, PA-C . Marrianne Mood, PA-C  Any Other Special Instructions Will Be Listed Below (If Applicable).  For educational health videos Log in to : [email or web address].com Or : [email or web address], password : [email or web address]

## 2020-01-08 NOTE — Progress Notes (Signed)
Cardiology Office Note  Date:  01/08/2020   ID:  Whitney Fleming, DOB 1946/01/30, MRN [id number]  PCP:  [name], MD   Chief Complaint  Patient presents with  . office visit    DOE; Meds verbally reviewed with patient's daughter.    HPI:  Ms.  Whitney Fleming is a 74 year old woman Referred by Dr.Bacigalupo for shortness of breath on exertion History of Morbid obesity sedentary lifestyle Diabetes type 2, hyperlipidemia  On recent visit with primary care last month reported having shortness of breath on exertion Worse in hot weather, going on for 1 year, seem to be getting worse Seen by pulmonary, Overnight oximetry was normal, Reports having multiple asthma exacerbations, Unable to use traditional inhalers Has been wheezing, more short of breath similar to asthma exacerbations Had good response with prednisone  Prior echocardiogram 2019 normal study  Daughter translates for her today Leads a sedentary lifestyle , deconditioned Walks around the house, does not go outside the house very much Weight trending upwards  CT scan 2019 images pulled up and reviewed in detail This shows moderate diffuse aortic atherosclerosis, significant coronary calcification/possibly left main versus proximal LAD  Echo 12/2019 results discussed 1. Left ventricular ejection fraction, by estimation, is 55 to 60%. The  left ventricle has normal function. The left ventricle has no regional  wall motion abnormalities. Left ventricular diastolic parameters are  consistent with Grade I diastolic  dysfunction (impaired relaxation).  2. Right ventricular systolic function is normal. The right ventricular  size is normal.  3. The mitral valve is normal in structure. No evidence of mitral valve  regurgitation.  4. The aortic valve is grossly normal. Aortic valve regurgitation is not  visualized. Mild aortic valve stenosis.   No edema, denies abdominal distention concerning for fluid  retention Sometimes poor sleep, chronic issue  EKG personally reviewed by myself on todays visit Shows normal sinus rhythm with rate 82 bpm, poor R wave progression to the anterior precordial leads, unable to exclude old anterior MI  PMH:   has a past medical history of Anemia, Asthma, Cardiomegaly, COPD (chronic obstructive pulmonary disease) ([place]), Diabetes mellitus without complication ([place]), Hypertension, and Hyponatremia.  PSH:    Past Surgical History:  Procedure Laterality Date  . TUBAL LIGATION      Current Outpatient Medications  Medication Sig Dispense Refill  . Accu-Chek FastClix Lancets MISC Use as directed to check blood glucose twice daily 100 each 2  . albuterol (ACCUNEB) 1.25 MG/3ML nebulizer solution Take 3 mLs (1.25 mg total) by nebulization every 4 (four) hours as needed for wheezing. 75 mL 12  . albuterol (PROVENTIL) (2.5 MG/3ML) 0.083% nebulizer solution USE 1 VIAL IN NEBULIZER THREE TIMES DAILY 225 mL 6  . AMBULATORY NON FORMULARY MEDICATION Medication Name: Aero chamber use as directed. DX:J45.909 1 each 0  . Blood Glucose Monitoring Suppl (ACCU-CHEK GUIDE) w/Device KIT USE TO TEST BLOOD SUGAR TWICE DAILY 1 kit 0  . budesonide (PULMICORT) 0.5 MG/2ML nebulizer solution Take 2 mLs (0.5 mg total) by nebulization 2 (two) times daily. DX:J45.50 130 mL 2  . budesonide-formoterol (SYMBICORT) 160-4.5 MCG/ACT inhaler Inhale 2 puffs into the lungs 2 (two) times daily. Rinse mouth after use 3 Inhaler 6  . carvedilol (COREG) 12.5 MG tablet Take 1 tablet (12.5 mg total) by mouth 2 (two) times daily with a meal. 180 tablet 0  . dextromethorphan-guaiFENesin (MUCINEX DM) 30-600 MG 12hr tablet Take 1 tablet by mouth 2 (two) times daily. 15 tablet 0  .  glucose blood (ACCU-CHEK GUIDE) test strip Use as instructed to test blood glucose twice daily 100 each 2  . guaiFENesin (MUCINEX) 600 MG 12 hr tablet Take 1 tablet (600 mg total) by mouth 2 (two) times daily. 60 tablet 1  . insulin  glargine (LANTUS SOLOSTAR) 100 UNIT/ML Solostar Pen Inject 10 Units into the skin daily. 5 pen 1  . Insulin Pen Needle (NOVOFINE) 32G X 6 MM MISC Use as directed to inject insulin daily 100 each 2  . JANUVIA 100 MG tablet Take 1 tablet by mouth once daily 90 tablet 1  . JARDIANCE 25 MG TABS tablet Take 1 tablet by mouth once daily 90 tablet 0  . losartan (COZAAR) 50 MG tablet Take 1 tablet by mouth once daily 90 tablet 1  . metFORMIN (GLUCOPHAGE) 1000 MG tablet TAKE 1 TABLET BY MOUTH TWICE DAILY WITH MEALS 180 tablet 0  . montelukast (SINGULAIR) 10 MG tablet Take 10 mg by mouth at bedtime.    . rosuvastatin (CRESTOR) 5 MG tablet Take 1 tablet by mouth once daily 90 tablet 0  . aspirin EC 81 MG tablet Take 1 tablet (81 mg total) by mouth daily. Swallow whole. 90 tablet 3   No current facility-administered medications for this visit.     Allergies:   Patient has no known allergies.   Social History:  The patient  reports that she has never smoked. She has never used smokeless tobacco. She reports that she does not drink alcohol and does not use drugs.   Family History:   family history includes Asthma in her father; Healthy in her mother; Heart disease in her father; Tuberculosis in an other family member.    Review of Systems: Review of Systems  Constitutional: Negative.   HENT: Negative.   Respiratory: Negative.   Cardiovascular: Negative.   Gastrointestinal: Negative.   Musculoskeletal: Negative.   Neurological: Negative.   Psychiatric/Behavioral: Negative.   All other systems reviewed and are negative.   PHYSICAL EXAM: VS:  BP (!) 110/52 (BP Location: Right Arm, Patient Position: Sitting, Cuff Size: Normal)   Pulse 82   Ht '5\' 1"'$  (1.549 m)   Wt 158 lb (71.7 kg)   SpO2 97%   BMI 29.85 kg/m  , BMI Body mass index is 29.85 kg/m. GEN: Well nourished, well developed, in no acute distress HEENT: normal Neck: no JVD, carotid bruits, or masses Cardiac: RRR; no murmurs, rubs, or  gallops,no edema  Respiratory:  clear to auscultation bilaterally, normal work of breathing GI: soft, nontender, nondistended, + BS MS: no deformity or atrophy Skin: warm and dry, no rash Neuro:  Strength and sensation are intact Psych: euthymic mood, full affect   Recent Labs: 08/17/2019: B Natriuretic Peptide 164.0 08/19/2019: Magnesium 2.1 10/29/2019: ALT 14; BUN 27; Creatinine, Ser 1.19; Hemoglobin 13.6; Platelets 194; Potassium 5.5; Sodium 130    Lipid Panel Lab Results  Component Value Date   CHOL 110 01/17/2018   HDL 37 (L) 01/17/2018   LDLCALC 52 01/17/2018   TRIG 103 01/17/2018      Wt Readings from Last 3 Encounters:  01/08/20 158 lb (71.7 kg)  10/29/19 158 lb (71.7 kg)  09/29/19 158 lb 6.4 oz (71.8 kg)     ASSESSMENT AND PLAN:  Problem List Items Addressed This Visit    Dyspnea on exertion   Relevant Orders   EKG 12-Lead   NM Myocar Multi W/Spect W/Wall Motion / EF    Other Visit Diagnoses    Angina pectoris (  [place])    -  Primary   Relevant Medications   aspirin EC 81 MG tablet   Other Relevant Orders   NM Myocar Multi W/Spect W/Wall Motion / EF   PAD (peripheral artery disease) (HCC)       Relevant Medications   aspirin EC 81 MG tablet   Coronary artery disease of native artery of native heart with stable angina pectoris (HCC)       Relevant Medications   aspirin EC 81 MG tablet     Shortness of breath Very sedentary at baseline, difficult to to interpret her symptoms and she is very sedentary There is significant coronary calcification on CT scan chest 2019 Significant aortic atherosclerosis After long discussion with her concerning various treatment options available, she agreed to pharmacologic Myoview She does not want any invasive procedures or catheterizations at this time -Recent echocardiogram with normal ejection fraction, no indication of elevated right heart pressures or valve disease Addendum: On scheduling the study as she was checking  out she did not want to hold any of her medications prior to the stress test and she has declined the test at this time Recommend we treat her medically -We did offer lung works program for conditioning, weight loss.  She will think about it but does not really want to do any exercise She has little bit of PT that she does at home The above was discussed with family who presented with her today  PAD Significant aortic atherosclerosis on CT scan 2019, images pulled up and reviewed with her in detail Stressed importance of aggressive diabetes and cholesterol control Suggested she start aspirin 81 mg daily  Aortic atherosclerosis Significant plaque noted as above Aggressive risk factor modification recommended  Diabetes type 2 with complications Recommended strict low carbohydrate diet, walking program, lifestyle modification    Total encounter time more than 60 minutes  Greater than 50% was spent in counseling and coordination of care with the patient      Signed, Esmond Plants, M.D., Ph.D. [place], [place]

## 2020-02-09 ENCOUNTER — Other Ambulatory Visit: Payer: Self-pay | Admitting: Family Medicine

## 2020-03-13 ENCOUNTER — Other Ambulatory Visit: Payer: Self-pay | Admitting: Family Medicine

## 2020-03-13 NOTE — Telephone Encounter (Signed)
Requested Prescriptions  Pending Prescriptions Disp Refills  . metFORMIN (GLUCOPHAGE) 1000 MG tablet [Pharmacy Med Name: metFORMIN HCl 1000 MG Oral Tablet] 180 tablet 0    Sig: TAKE 1 TABLET BY MOUTH TWICE DAILY WITH MEALS     Endocrinology:  Diabetes - Biguanides Failed - 03/13/2020  1:05 PM      Failed - Cr in normal range and within 360 days    Creatinine  Date Value Ref Range Status  12/26/2012 1.16 0.60 - 1.30 mg/dL Final   Creatinine, Ser  Date Value Ref Range Status  10/29/2019 1.19 (H) 0.44 - 1.00 mg/dL Final         Failed - HBA1C is between 0 and 7.9 and within 180 days    Hemoglobin A1C  Date Value Ref Range Status  12/25/2012 7.9 (H) 4.2 - 6.3 % Final    Comment:    The American Diabetes Association recommends that a primary goal of therapy should be <7% and that physicians should reevaluate the treatment regimen in patients with HbA1c values consistently >8%.    Hgb A1c MFr Bld  Date Value Ref Range Status  08/18/2019 7.9 (H) 4.8 - 5.6 % Final    Comment:    (NOTE) Pre diabetes:          5.7%-6.4% Diabetes:              >6.4% Glycemic control for   <7.0% adults with diabetes          Failed - eGFR in normal range and within 360 days    EGFR (African American)  Date Value Ref Range Status  12/26/2012 56 (L)  Final   GFR calc Af Amer  Date Value Ref Range Status  10/29/2019 52 (L) >60 mL/min Final   EGFR (Non-African Amer.)  Date Value Ref Range Status  12/26/2012 48 (L)  Final    Comment:    eGFR values <68mL/min/1.73 m2 may be an indication of chronic kidney disease (CKD). Calculated eGFR is useful in patients with stable renal function. The eGFR calculation will not be reliable in acutely ill patients when serum creatinine is changing rapidly. It is not useful in  patients on dialysis. The eGFR calculation may not be applicable to patients at the low and high extremes of body sizes, pregnant women, and vegetarians. CALCIUM - RESULTS VERIFIED BY  REPEAT TESTING.  - NOTIFIED OF CRITICAL VALUE  - C/BRANDY KELLY AT 0715 12/26/12-LAB  - READ-BACK PROCESS PERFORMED.    GFR calc non Af Amer  Date Value Ref Range Status  10/29/2019 45 (L) >60 mL/min Final         Passed - Valid encounter within last 6 months    Recent Outpatient Visits          3 months ago Dyspnea on exertion   [facility] [employer], Dionne Bucy, MD   6 months ago Severe persistent asthma without complication   [facility] [place], Dionne Bucy, MD   1 year ago Type 2 diabetes mellitus with hyperglycemia, without long-term current use of insulin [facility])   [facility], Dionne Bucy, MD   1 year ago Type 2 diabetes mellitus with hyperglycemia, without long-term current use of insulin [facility])   [facility], Dionne Bucy, MD   1 year ago Type 2 diabetes mellitus with hyperglycemia, without long-term current use of insulin [facility])   [facility], Dionne Bucy, MD      Future Appointments  In 1 month Bacigalupo, Dionne Bucy, MD [facility], [facility]           . rosuvastatin (CRESTOR) 5 MG tablet [Pharmacy Med Name: Rosuvastatin Calcium 5 MG Oral Tablet] 90 tablet 0    Sig: Take 1 tablet by mouth once daily     Cardiovascular:  Antilipid - Statins Failed - 03/13/2020  1:05 PM      Failed - Total Cholesterol in normal range and within 360 days    Cholesterol, Total  Date Value Ref Range Status  01/17/2018 110 100 - 199 mg/dL Final   Cholesterol  Date Value Ref Range Status  12/25/2012 91 0 - 200 mg/dL Final         Failed - LDL in normal range and within 360 days    Ldl Cholesterol, Calc  Date Value Ref Range Status  12/25/2012 36 0 - 100 mg/dL Final   LDL Calculated  Date Value Ref Range Status  01/17/2018 52 0 - 99 mg/dL Final         Failed - HDL in normal range and within 360 days    HDL Cholesterol  Date Value Ref Range Status  12/25/2012  26 (L) 40 - 60 mg/dL Final   HDL  Date Value Ref Range Status  01/17/2018 37 (L) >39 mg/dL Final         Failed - Triglycerides in normal range and within 360 days    Triglycerides  Date Value Ref Range Status  01/17/2018 103 0 - 149 mg/dL Final  12/25/2012 143 0 - 200 mg/dL Final         Passed - Patient is not pregnant      Passed - Valid encounter within last 12 months    Recent Outpatient Visits          3 months ago Dyspnea on exertion   [facility] [place], Dionne Bucy, MD   6 months ago Severe persistent asthma without complication   [facility] [place], Dionne Bucy, MD   1 year ago Type 2 diabetes mellitus with hyperglycemia, without long-term current use of insulin [facility])   [facility], Dionne Bucy, MD   1 year ago Type 2 diabetes mellitus with hyperglycemia, without long-term current use of insulin [facility])   [facility], Dionne Bucy, MD   1 year ago Type 2 diabetes mellitus with hyperglycemia, without long-term current use of insulin [facility])   [place], Dionne Bucy, MD      Future Appointments            In 1 month Bacigalupo, Dionne Bucy, MD [facility], [facility]

## 2020-03-14 ENCOUNTER — Other Ambulatory Visit: Payer: Self-pay

## 2020-03-14 MED ORDER — MONTELUKAST SODIUM 10 MG PO TABS: 10.0000 mg | ORAL_TABLET | Freq: Every day | ORAL | 11 refills | Status: DC

## 2020-03-14 NOTE — Telephone Encounter (Signed)
Rx for singulair has been sent to [place], as requested by pharmacy.

## 2020-03-16 ENCOUNTER — Other Ambulatory Visit: Payer: Self-pay | Admitting: Family Medicine

## 2020-03-27 ENCOUNTER — Other Ambulatory Visit: Payer: Self-pay | Admitting: Family Medicine

## 2020-03-27 ENCOUNTER — Other Ambulatory Visit: Payer: Self-pay | Admitting: Physician Assistant

## 2020-03-27 NOTE — Telephone Encounter (Signed)
Requested medication (s) are due for refill today: yes  Requested medication (s) are on the active medication list: yes  Last refill:  12/16/19   Future visit scheduled: yes  Notes to clinic:  overdue lab work (Hgb A1C)   Requested Prescriptions  Pending Prescriptions Disp Refills   JARDIANCE 25 MG TABS tablet [Pharmacy Med Name: Jardiance 25 MG Oral Tablet] 90 tablet 0    Sig: Take 1 tablet by mouth once daily      Endocrinology:  Diabetes - SGLT2 Inhibitors Failed - 03/27/2020  3:15 PM      Failed - Cr in normal range and within 360 days    Creatinine  Date Value Ref Range Status  12/26/2012 1.16 0.60 - 1.30 mg/dL Final   Creatinine, Ser  Date Value Ref Range Status  10/29/2019 1.19 (H) 0.44 - 1.00 mg/dL Final          Failed - LDL in normal range and within 360 days    Ldl Cholesterol, Calc  Date Value Ref Range Status  12/25/2012 36 0 - 100 mg/dL Final   LDL Calculated  Date Value Ref Range Status  01/17/2018 52 0 - 99 mg/dL Final          Failed - HBA1C is between 0 and 7.9 and within 180 days    Hemoglobin A1C  Date Value Ref Range Status  12/25/2012 7.9 (H) 4.2 - 6.3 % Final    Comment:    The American Diabetes Association recommends that a primary goal of therapy should be <7% and that physicians should reevaluate the treatment regimen in patients with HbA1c values consistently >8%.    Hgb A1c MFr Bld  Date Value Ref Range Status  08/18/2019 7.9 (H) 4.8 - 5.6 % Final    Comment:    (NOTE) Pre diabetes:          5.7%-6.4% Diabetes:              >6.4% Glycemic control for   <7.0% adults with diabetes           Failed - eGFR in normal range and within 360 days    EGFR (African American)  Date Value Ref Range Status  12/26/2012 56 (L)  Final   GFR calc Af Amer  Date Value Ref Range Status  10/29/2019 52 (L) >60 mL/min Final   EGFR (Non-African Amer.)  Date Value Ref Range Status  12/26/2012 48 (L)  Final    Comment:    eGFR values  <34m/min/1.73 m2 may be an indication of chronic kidney disease (CKD). Calculated eGFR is useful in patients with stable renal function. The eGFR calculation will not be reliable in acutely ill patients when serum creatinine is changing rapidly. It is not useful in  patients on dialysis. The eGFR calculation may not be applicable to patients at the low and high extremes of body sizes, pregnant women, and vegetarians. CALCIUM - RESULTS VERIFIED BY REPEAT TESTING.  - NOTIFIED OF CRITICAL VALUE  - C/BRANDY KELLY AT 0715 12/26/12-LAB  - READ-BACK PROCESS PERFORMED.    GFR calc non Af Amer  Date Value Ref Range Status  10/29/2019 45 (L) >60 mL/min Final          Passed - Valid encounter within last 6 months    Recent Outpatient Visits           4 months ago [facility]   7 months ago  Severe persistent asthma without complication   [facility] [place], Dionne Bucy, MD   1 year ago Type 2 diabetes mellitus with hyperglycemia, without long-term current use of insulin [facility])   [facility] [place], Dionne Bucy, MD   1 year ago Type 2 diabetes mellitus with hyperglycemia, without long-term current use of insulin [facility])   [facility], Dionne Bucy, MD   1 year ago Type 2 diabetes mellitus with hyperglycemia, without long-term current use of insulin [facility])   [facility] Bacigalupo, Dionne Bucy, MD       Future Appointments             In 1 month Bacigalupo, Dionne Bucy, MD [facility], [facility]

## 2020-03-30 ENCOUNTER — Other Ambulatory Visit: Payer: Self-pay | Admitting: Family Medicine

## 2020-03-30 MED ORDER — METFORMIN HCL 1000 MG PO TABS: 1000.0000 mg | ORAL_TABLET | Freq: Two times a day (BID) | ORAL | 0 refills | Status: DC

## 2020-03-30 NOTE — Telephone Encounter (Signed)
Prescription re-submitted to pharmacy. E-Prescribing Status: Receipt confirmed by pharmacy (03/30/2020 5:03 PM EDT)

## 2020-03-30 NOTE — Telephone Encounter (Signed)
Pts refill for metFORMIN (GLUCOPHAGE) 1000 MG tablet Do not go through to pharmacy when sent/ please resend today if possible

## 2020-03-30 NOTE — Addendum Note (Signed)
Addended by: Randal Buba on: 03/30/2020 05:04 PM   Modules accepted: Orders

## 2020-04-05 ENCOUNTER — Other Ambulatory Visit: Payer: Self-pay | Admitting: Family Medicine

## 2020-04-05 NOTE — Telephone Encounter (Signed)
   Notes to clinic: test strips filled under a different provider  Review for refill   Requested Prescriptions  Pending Prescriptions Disp Refills   ACCU-CHEK GUIDE test strip [Pharmacy Med Name: Accu-Chek Guide In Vitro Strip] 64 each 0    Sig: USE AS DIRECTED TWICE DAILY      Endocrinology: Diabetes - Testing Supplies Passed - 04/05/2020  7:15 PM      Passed - Valid encounter within last 12 months    Recent Outpatient Visits           4 months ago Dyspnea on exertion   [facility] [place], Dionne Bucy, MD   7 months ago Severe persistent asthma without complication   [facility], Dionne Bucy, MD   1 year ago Type 2 diabetes mellitus with hyperglycemia, without long-term current use of insulin [facility])   [facility], Dionne Bucy, MD   1 year ago Type 2 diabetes mellitus with hyperglycemia, without long-term current use of insulin [facility])   [facility], Dionne Bucy, MD   1 year ago Type 2 diabetes mellitus with hyperglycemia, without long-term current use of insulin [facility])   [place], Dionne Bucy, MD       Future Appointments             In 3 weeks Bacigalupo, Dionne Bucy, MD [facility], [facility]

## 2020-04-06 NOTE — Addendum Note (Signed)
Addended by: Mliss Sax on: 04/06/2020 03:38 PM   Modules accepted: Orders

## 2020-04-06 NOTE — Telephone Encounter (Signed)
Daughter calling back to ask about the accu check test strips.  She has been trying to get for over a week, (10 days) and they need today in order to check her sugar.  Pt is out of strips and needs asap.  [place], [place] Phone:  [phone]  Fax:  [phone]

## 2020-04-22 ENCOUNTER — Inpatient Hospital Stay: Payer: Medicaid Other | Admitting: Oncology

## 2020-04-22 ENCOUNTER — Inpatient Hospital Stay: Payer: Medicaid Other | Attending: Oncology

## 2020-04-28 ENCOUNTER — Other Ambulatory Visit: Payer: Self-pay

## 2020-04-28 ENCOUNTER — Ambulatory Visit (INDEPENDENT_AMBULATORY_CARE_PROVIDER_SITE_OTHER): Payer: Medicaid Other | Admitting: Family Medicine

## 2020-04-28 ENCOUNTER — Encounter: Payer: Self-pay | Admitting: Family Medicine

## 2020-04-28 VITALS — BP 106/60 | HR 78 | Temp 98.6°F | Resp 16 | Ht <= 58 in | Wt 166.0 lb

## 2020-04-28 DIAGNOSIS — D72829 Elevated white blood cell count, unspecified: Secondary | ICD-10-CM | POA: Diagnosis not present

## 2020-04-28 DIAGNOSIS — E1169 Type 2 diabetes mellitus with other specified complication: Secondary | ICD-10-CM | POA: Diagnosis not present

## 2020-04-28 DIAGNOSIS — Z23 Encounter for immunization: Secondary | ICD-10-CM | POA: Diagnosis not present

## 2020-04-28 DIAGNOSIS — E785 Hyperlipidemia, unspecified: Secondary | ICD-10-CM | POA: Diagnosis not present

## 2020-04-28 DIAGNOSIS — E1165 Type 2 diabetes mellitus with hyperglycemia: Secondary | ICD-10-CM

## 2020-04-28 DIAGNOSIS — Z0000 Encounter for general adult medical examination without abnormal findings: Secondary | ICD-10-CM

## 2020-04-28 DIAGNOSIS — I10 Essential (primary) hypertension: Secondary | ICD-10-CM | POA: Diagnosis not present

## 2020-04-28 DIAGNOSIS — J411 Mucopurulent chronic bronchitis: Secondary | ICD-10-CM | POA: Diagnosis not present

## 2020-04-28 MED ORDER — DEXCOM G6 RECEIVER DEVI: 3 refills | Status: DC

## 2020-04-28 MED ORDER — DEXCOM G6 SENSOR MISC: 5 refills | Status: DC

## 2020-04-28 NOTE — Assessment & Plan Note (Signed)
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

## 2020-04-28 NOTE — Assessment & Plan Note (Signed)
Well controlled Continue current medications Recheck metabolic panel F/u in 6 months  

## 2020-04-28 NOTE — Assessment & Plan Note (Addendum)
Chronic and uncontrolled With hyperglycemia Recheck A1c Continue current medications, pending A1c Encourage low-carb diet Foot exam completed today Up-to-date on other screenings Prevnar given today Work on getting Dexcom continuous glucose monitor as this seems to be on formulary for Medicaid Follow-up in 3 months

## 2020-04-28 NOTE — Assessment & Plan Note (Signed)
Previously well controlled Continue Crestor at current dose Recheck FLP and CMP

## 2020-04-28 NOTE — Progress Notes (Signed)
Complete physical exam   Patient: Shizue Kaseman   DOB: [birth year]   74 y.o. Female  MRN: [id number] Visit Date: 04/28/2020  Today's healthcare provider: Lavon Paganini, MD   Chief Complaint  Patient presents with  . Annual Exam   Subjective    Kathelene Rumberger is a 74 y.o. female who presents today for a complete physical exam.  She reports consuming a general diet. The patient does not participate in regular exercise at present. She generally feels fairly well. She reports sleeping poorly. She does have additional problems to discuss today.  HPI    Want to check blood glucose less often  Taking barley and thinks this will keep her from going on dialysis  Declines mammogram, DEXA, colon cancer screening.  Past Medical History:  Diagnosis Date  . Anemia    normocytic anemia  . Asthma   . Cardiomegaly   . COPD (chronic obstructive pulmonary disease) ([place])   . Diabetes mellitus without complication ([place])   . Hypertension   . Hyponatremia    Past Surgical History:  Procedure Laterality Date  . TUBAL LIGATION     Social History   Socioeconomic History  . Marital status: Married    Spouse name: Not on file  . Number of children: 4  . Years of education: Not on file  . Highest education level: Not on file  Occupational History  . Occupation: homemaker  Tobacco Use  . Smoking status: Never Smoker  . Smokeless tobacco: Never Used  Vaping Use  . Vaping Use: Never used  Substance and Sexual Activity  . Alcohol use: Never  . Drug use: Never  . Sexual activity: Not Currently  Other Topics Concern  . Not on file  Social History Narrative  . Not on file   Social Determinants of Health   Financial Resource Strain:   . Difficulty of Paying Living Expenses: Not on file  Food Insecurity:   . Worried About Charity fundraiser in the Last Year: Not on file  . Ran Out of Food in the Last Year: Not on file  Transportation Needs:   . Lack of Transportation  (Medical): Not on file  . Lack of Transportation (Non-Medical): Not on file  Physical Activity:   . Days of Exercise per Week: Not on file  . Minutes of Exercise per Session: Not on file  Stress:   . Feeling of Stress : Not on file  Social Connections:   . Frequency of Communication with Friends and Family: Not on file  . Frequency of Social Gatherings with Friends and Family: Not on file  . Attends Religious Services: Not on file  . Active Member of Clubs or Organizations: Not on file  . Attends Archivist Meetings: Not on file  . Marital Status: Not on file  Intimate Partner Violence:   . Fear of Current or Ex-Partner: Not on file  . Emotionally Abused: Not on file  . Physically Abused: Not on file  . Sexually Abused: Not on file   Family Status  Relation Name Status  . Other  (Not Specified)  . Mother  Deceased  . Father  Deceased  . Neg Hx  (Not Specified)   Family History  Problem Relation Age of Onset  . Tuberculosis Other   . Healthy Mother   . Heart disease Father   . Asthma Father   . Cancer Neg Hx    No Known Allergies  Patient Care Team: Dulce,  Dionne Bucy, MD as PCP - General (Family Medicine)   Medications: Outpatient Medications Prior to Visit  Medication Sig  . Accu-Chek FastClix Lancets MISC Use as directed to check blood glucose twice daily  . albuterol (PROVENTIL) (2.5 MG/3ML) 0.083% nebulizer solution USE 1 VIAL IN NEBULIZER THREE TIMES DAILY  . AMBULATORY NON FORMULARY MEDICATION Medication Name: Aero chamber use as directed. KF:M40.375  . aspirin EC 81 MG tablet Take 1 tablet (81 mg total) by mouth daily. Swallow whole.  . Blood Glucose Monitoring Suppl (ACCU-CHEK GUIDE) w/Device KIT USE TO TEST BLOOD SUGAR TWICE DAILY  . budesonide-formoterol (SYMBICORT) 160-4.5 MCG/ACT inhaler Inhale 2 puffs into the lungs 2 (two) times daily. Rinse mouth after use  . carvedilol (COREG) 12.5 MG tablet TAKE 1 TABLET BY MOUTH TWICE DAILY WITH A MEAL    . dextromethorphan-guaiFENesin (MUCINEX DM) 30-600 MG 12hr tablet Take 1 tablet by mouth 2 (two) times daily.  Marland Kitchen glucose blood (ACCU-CHEK GUIDE) test strip USE AS DIRECTED TWICE DAILY  . guaiFENesin (MUCINEX) 600 MG 12 hr tablet Take 1 tablet (600 mg total) by mouth 2 (two) times daily.  . insulin glargine (LANTUS SOLOSTAR) 100 UNIT/ML Solostar Pen Inject 10 Units into the skin daily.  . Insulin Pen Needle (NOVOFINE) 32G X 6 MM MISC Use as directed to inject insulin daily  . JANUVIA 100 MG tablet Take 1 tablet by mouth once daily  . JARDIANCE 25 MG TABS tablet Take 1 tablet by mouth once daily  . losartan (COZAAR) 50 MG tablet Take 1 tablet by mouth once daily  . metFORMIN (GLUCOPHAGE) 1000 MG tablet Take 1 tablet (1,000 mg total) by mouth 2 (two) times daily with a meal.  . montelukast (SINGULAIR) 10 MG tablet Take 1 tablet (10 mg total) by mouth at bedtime.  . rosuvastatin (CRESTOR) 5 MG tablet Take 1 tablet by mouth once daily  . [DISCONTINUED] albuterol (ACCUNEB) 1.25 MG/3ML nebulizer solution Take 3 mLs (1.25 mg total) by nebulization every 4 (four) hours as needed for wheezing.  . [DISCONTINUED] budesonide (PULMICORT) 0.5 MG/2ML nebulizer solution Take 2 mLs (0.5 mg total) by nebulization 2 (two) times daily. DX:J45.50   No facility-administered medications prior to visit.    Review of Systems  Constitutional: Negative.   HENT: Positive for hearing loss.   Eyes: Negative.   Cardiovascular: Negative.   Gastrointestinal: Negative.   Endocrine: Negative.   Genitourinary: Negative.   Musculoskeletal: Negative.   Skin: Negative.   Allergic/Immunologic: Negative.   Neurological: Negative.   Hematological: Negative.   Psychiatric/Behavioral: Negative.     Last CBC Lab Results  Component Value Date   WBC 11.5 (H) 10/29/2019   HGB 13.6 10/29/2019   HCT 42.1 10/29/2019   MCV 80.2 10/29/2019   MCH 25.9 (L) 10/29/2019   RDW 14.0 10/29/2019   PLT 194 43/60/6770   Last metabolic  panel Lab Results  Component Value Date   GLUCOSE 248 (H) 10/29/2019   NA 130 (L) 10/29/2019   K 5.5 (H) 10/29/2019   CL 104 10/29/2019   CO2 19 (L) 10/29/2019   BUN 27 (H) 10/29/2019   CREATININE 1.19 (H) 10/29/2019   GFRNONAA 45 (L) 10/29/2019   GFRAA 52 (L) 10/29/2019   CALCIUM 8.7 (L) 10/29/2019   PHOS 5.0 (H) 08/26/2019   PROT 7.1 10/29/2019   ALBUMIN 3.8 10/29/2019   LABGLOB 2.8 04/23/2019   BILITOT 0.5 10/29/2019   ALKPHOS 72 10/29/2019   AST 15 10/29/2019   ALT 14 10/29/2019  ANIONGAP 7 10/29/2019   Last lipids Lab Results  Component Value Date   CHOL 110 01/17/2018   HDL 37 (L) 01/17/2018   LDLCALC 52 01/17/2018   TRIG 103 01/17/2018   CHOLHDL 3.0 01/17/2018   Last hemoglobin A1c Lab Results  Component Value Date   HGBA1C 7.9 (H) 08/18/2019   Last thyroid functions Lab Results  Component Value Date   TSH 1.959 02/05/2018   Last vitamin D No results found for: 25OHVITD2, 25OHVITD3, VD25OH Last vitamin B12 and Folate Lab Results  Component Value Date   VITAMINB12 514 01/17/2018   FOLATE 13.8 01/17/2018      Objective    BP 106/60 (BP Location: Left Arm, Patient Position: Sitting, Cuff Size: Large)   Pulse 78   Temp 98.6 F (37 C) (Oral)   Resp 16   Ht _0  (1.473 m)   Wt 166 lb (75.3 kg)   SpO2 99%   BMI 34.69 kg/m  BP Readings from Last 3 Encounters:  04/28/20 106/60  01/08/20 (!) 110/52  10/29/19 (!) 120/45   Wt Readings from Last 3 Encounters:  04/28/20 166 lb (75.3 kg)  01/08/20 158 lb (71.7 kg)  10/29/19 158 lb (71.7 kg)      Physical Exam Vitals reviewed.  Constitutional:      General: She is not in acute distress.    Appearance: Normal appearance. She is well-developed. She is not diaphoretic.  HENT:     Head: Normocephalic and atraumatic.  Eyes:     General: No scleral icterus.    Conjunctiva/sclera: Conjunctivae normal.     Pupils: Pupils are equal, round, and reactive to light.  Neck:     Thyroid: No  thyromegaly.  Cardiovascular:     Rate and Rhythm: Normal rate and regular rhythm.     Pulses: Normal pulses.     Heart sounds: Normal heart sounds. No murmur heard.   Pulmonary:     Effort: Pulmonary effort is normal. No respiratory distress.     Breath sounds: Normal breath sounds. No wheezing or rales.  Chest:     Comments: Breasts: breasts appear normal, no suspicious masses, no skin or nipple changes or axillary nodes.  Abdominal:     General: There is no distension.     Palpations: Abdomen is soft.     Tenderness: There is no abdominal tenderness.  Musculoskeletal:        General: No deformity.     Cervical back: Neck supple.     Right lower leg: No edema.     Left lower leg: No edema.  Lymphadenopathy:     Cervical: No cervical adenopathy.  Skin:    General: Skin is warm and dry.     Findings: No rash.  Neurological:     Mental Status: She is alert and oriented to person, place, and time. Mental status is at baseline.     Cranial Nerves: Cranial nerve deficit: antalgic, slow.     Gait: Gait abnormal.  Psychiatric:        Mood and Affect: Mood normal.        Behavior: Behavior normal.       Last depression screening scores PHQ 2/9 Scores 04/28/2020 12/19/2017  PHQ - 2 Score 0 0  PHQ- 9 Score 6 -   Last fall risk screening Fall Risk  04/28/2020  Falls in the past year? 0  Number falls in past yr: 0  Injury with Fall? 0  Risk for fall due to :  No Fall Risks  Follow up Falls evaluation completed   Last Audit-C alcohol use screening Alcohol Use Disorder Test (AUDIT) 04/28/2020  1. How often do you have a drink containing alcohol? 0  2. How many drinks containing alcohol do you have on a typical day when you are drinking? 0  3. How often do you have six or more drinks on one occasion? 0  AUDIT-C Score 0  Alcohol Brief Interventions/Follow-up AUDIT Score <7 follow-up not indicated   A score of 3 or more in women, and 4 or more in men indicates increased risk  for alcohol abuse, EXCEPT if all of the points are from question 1   No results found for any visits on 04/28/20.  Assessment & Plan    Routine Health Maintenance and Physical Exam  Exercise Activities and Dietary recommendations Goals   None     Immunization History  Administered Date(s) Administered  . Influenza, High Dose Seasonal PF 02/25/2018  . PFIZER SARS-COV-2 Vaccination 07/17/2019, 08/06/2019  . Pneumococcal Conjugate-13 04/28/2020  . Pneumococcal Polysaccharide-23 01/14/2018    Health Maintenance  Topic Date Due  . COLONOSCOPY  Never done  . DEXA SCAN  Never done  . HEMOGLOBIN A1C  02/18/2020  . INFLUENZA VACCINE  09/08/2020 (Originally 01/10/2020)  . TETANUS/TDAP  03/04/2023 (Originally 05/12/1965)  . OPHTHALMOLOGY EXAM  10/25/2020  . FOOT EXAM  04/28/2021  . COVID-19 Vaccine  Completed  . Hepatitis C Screening  Completed  . PNA vac Low Risk Adult  Completed    Discussed health benefits of physical activity, and encouraged her to engage in regular exercise appropriate for her age and condition.  Problem List Items Addressed This Visit      Cardiovascular and Mediastinum   HTN (hypertension)    Well controlled Continue current medications Recheck metabolic panel F/u in 6 months       Relevant Orders   Comprehensive metabolic panel     Respiratory   COPD (chronic obstructive pulmonary disease) (HCC)    COPD/asthma overlap syndrome Treated by pulmonology No changes to medications today Exacerbations have calmed down recently        Endocrine   Diabetes ([place])    Chronic and uncontrolled With hyperglycemia Recheck A1c Continue current medications, pending A1c Encourage low-carb diet Foot exam completed today Up-to-date on other screenings Prevnar given today Work on getting Dexcom continuous glucose monitor as this seems to be on formulary for Medicaid Follow-up in 3 months      Relevant Orders   Hemoglobin A1c   Hyperlipidemia associated  with type 2 diabetes mellitus ([place])    Previously well controlled Continue Crestor at current dose Recheck FLP and CMP      Relevant Orders   Comprehensive metabolic panel   Lipid panel    Other Visit Diagnoses    Encounter for annual physical exam    -  Primary   Relevant Orders   Hemoglobin A1c   CBC w/Diff/Platelet   Comprehensive metabolic panel   Lipid panel   Need for vaccination with 13-polyvalent pneumococcal conjugate vaccine       Relevant Orders   Pneumococcal conjugate vaccine 13-valent IM (Completed)   Leukocytosis, unspecified type       Relevant Orders   CBC w/Diff/Platelet       Return in about 3 months (around 07/29/2020) for chronic disease f/u.     I, Lavon Paganini, MD, have reviewed all documentation for this visit. The documentation on 04/28/20 for the exam, diagnosis,  procedures, and orders are all accurate and complete.   Yaa Donnellan, Dionne Bucy, MD, MPH [facility]

## 2020-04-28 NOTE — Patient Instructions (Signed)
Preventive Care 38 Years and Older, Female Preventive care refers to lifestyle choices and visits with your health care provider that can promote health and wellness. This includes:  A yearly physical exam. This is also called an annual well check.  Regular dental and eye exams.  Immunizations.  Screening for certain conditions.  Healthy lifestyle choices, such as diet and exercise. What can I expect for my preventive care visit? Physical exam Your health care provider will check:  Height and weight. These may be used to calculate body mass index (BMI), which is a measurement that tells if you are at a healthy weight.  Heart rate and blood pressure.  Your skin for abnormal spots. Counseling Your health care provider may ask you questions about:  Alcohol, tobacco, and drug use.  Emotional well-being.  Home and relationship well-being.  Sexual activity.  Eating habits.  History of falls.  Memory and ability to understand (cognition).  Work and work Statistician.  Pregnancy and menstrual history. What immunizations do I need?  Influenza (flu) vaccine  This is recommended every year. Tetanus, diphtheria, and pertussis (Tdap) vaccine  You may need a Td booster every 10 years. Varicella (chickenpox) vaccine  You may need this vaccine if you have not already been vaccinated. Zoster (shingles) vaccine  You may need this after age 33. Pneumococcal conjugate (PCV13) vaccine  One dose is recommended after age 33. Pneumococcal polysaccharide (PPSV23) vaccine  One dose is recommended after age 72. Measles, mumps, and rubella (MMR) vaccine  You may need at least one dose of MMR if you were born in 1957 or later. You may also need a second dose. Meningococcal conjugate (MenACWY) vaccine  You may need this if you have certain conditions. Hepatitis A vaccine  You may need this if you have certain conditions or if you travel or work in places where you may be exposed  to hepatitis A. Hepatitis B vaccine  You may need this if you have certain conditions or if you travel or work in places where you may be exposed to hepatitis B. Haemophilus influenzae type b (Hib) vaccine  You may need this if you have certain conditions. You may receive vaccines as individual doses or as more than one vaccine together in one shot (combination vaccines). Talk with your health care provider about the risks and benefits of combination vaccines. What tests do I need? Blood tests  Lipid and cholesterol levels. These may be checked every 5 years, or more frequently depending on your overall health.  Hepatitis C test.  Hepatitis B test. Screening  Lung cancer screening. You may have this screening every year starting at age 39 if you have a 30-pack-year history of smoking and currently smoke or have quit within the past 15 years.  Colorectal cancer screening. All adults should have this screening starting at age 36 and continuing until age 15. Your health care provider may recommend screening at age 23 if you are at increased risk. You will have tests every 1-10 years, depending on your results and the type of screening test.  Diabetes screening. This is done by checking your blood sugar (glucose) after you have not eaten for a while (fasting). You may have this done every 1-3 years.  Mammogram. This may be done every 1-2 years. Talk with your health care provider about how often you should have regular mammograms.  BRCA-related cancer screening. This may be done if you have a family history of breast, ovarian, tubal, or peritoneal cancers.  Other tests  Sexually transmitted disease (STD) testing.  Bone density scan. This is done to screen for osteoporosis. You may have this done starting at age 44. Follow these instructions at home: Eating and drinking  Eat a diet that includes fresh fruits and vegetables, whole grains, lean protein, and low-fat dairy products. Limit  your intake of foods with high amounts of sugar, saturated fats, and salt.  Take vitamin and mineral supplements as recommended by your health care provider.  Do not drink alcohol if your health care provider tells you not to drink.  If you drink alcohol: ? Limit how much you have to 0-1 drink a day. ? Be aware of how much alcohol is in your drink. In the [place] one drink equals one 12 oz bottle of beer (355 mL), one 5 oz glass of wine (148 mL), or one 1 oz glass of hard liquor (44 mL). Lifestyle  Take daily care of your teeth and gums.  Stay active. Exercise for at least 30 minutes on 5 or more days each week.  Do not use any products that contain nicotine or tobacco, such as cigarettes, e-cigarettes, and chewing tobacco. If you need help quitting, ask your health care provider.  If you are sexually active, practice safe sex. Use a condom or other form of protection in order to prevent STIs (sexually transmitted infections).  Talk with your health care provider about taking a low-dose aspirin or statin. What's next?  Go to your health care provider once a year for a well check visit.  Ask your health care provider how often you should have your eyes and teeth checked.  Stay up to date on all vaccines. This information is not intended to replace advice given to you by your health care provider. Make sure you discuss any questions you have with your health care provider. Document Revised: 05/22/2018 Document Reviewed: 05/22/2018 Elsevier Patient Education  2020 [employer].

## 2020-04-29 LAB — CBC WITH DIFFERENTIAL/PLATELET
Basophils Absolute: 0.1 10*3/uL (ref 0.0–0.2)
Basos: 1 %
EOS (ABSOLUTE): 0.6 10*3/uL — ABNORMAL HIGH (ref 0.0–0.4)
Eos: 6 %
Hematocrit: 42.3 % (ref 34.0–46.6)
Hemoglobin: 12.7 g/dL (ref 11.1–15.9)
Immature Grans (Abs): 0.1 10*3/uL (ref 0.0–0.1)
Immature Granulocytes: 1 %
Lymphocytes Absolute: 2.9 10*3/uL (ref 0.7–3.1)
Lymphs: 30 %
MCH: 25 pg — ABNORMAL LOW (ref 26.6–33.0)
MCHC: 30 g/dL — ABNORMAL LOW (ref 31.5–35.7)
MCV: 83 fL (ref 79–97)
Monocytes Absolute: 0.9 10*3/uL (ref 0.1–0.9)
Monocytes: 9 %
Neutrophils Absolute: 5.1 10*3/uL (ref 1.4–7.0)
Neutrophils: 53 %
Platelets: 247 10*3/uL (ref 150–450)
RBC: 5.09 x10E6/uL (ref 3.77–5.28)
RDW: 13.1 % (ref 11.7–15.4)
WBC: 9.7 10*3/uL (ref 3.4–10.8)

## 2020-04-29 LAB — COMPREHENSIVE METABOLIC PANEL
ALT: 15 IU/L (ref 0–32)
AST: 11 IU/L (ref 0–40)
Albumin/Globulin Ratio: 1.5 (ref 1.2–2.2)
Albumin: 3.9 g/dL (ref 3.7–4.7)
Alkaline Phosphatase: 85 IU/L (ref 44–121)
BUN/Creatinine Ratio: 19 (ref 12–28)
BUN: 24 mg/dL (ref 8–27)
Bilirubin Total: 0.4 mg/dL (ref 0.0–1.2)
CO2: 19 mmol/L — ABNORMAL LOW (ref 20–29)
Calcium: 9 mg/dL (ref 8.7–10.3)
Chloride: 99 mmol/L (ref 96–106)
Creatinine, Ser: 1.25 mg/dL — ABNORMAL HIGH (ref 0.57–1.00)
GFR calc Af Amer: 49 mL/min/{1.73_m2} — ABNORMAL LOW (ref >59)
GFR calc non Af Amer: 42 mL/min/{1.73_m2} — ABNORMAL LOW (ref >59)
Globulin, Total: 2.6 g/dL (ref 1.5–4.5)
Glucose: 244 mg/dL — ABNORMAL HIGH (ref 65–99)
Potassium: 6.1 mmol/L — ABNORMAL HIGH (ref 3.5–5.2)
Sodium: 131 mmol/L — ABNORMAL LOW (ref 134–144)
Total Protein: 6.5 g/dL (ref 6.0–8.5)

## 2020-04-29 LAB — LIPID PANEL
Chol/HDL Ratio: 2.6 ratio (ref 0.0–4.4)
Cholesterol, Total: 127 mg/dL (ref 100–199)
HDL: 49 mg/dL (ref >39)
LDL Chol Calc (NIH): 54 mg/dL (ref 0–99)
Triglycerides: 138 mg/dL (ref 0–149)
VLDL Cholesterol Cal: 24 mg/dL (ref 5–40)

## 2020-04-29 LAB — HEMOGLOBIN A1C
Est. average glucose Bld gHb Est-mCnc: 252 mg/dL
Hgb A1c MFr Bld: 10.4 % — ABNORMAL HIGH (ref 4.8–5.6)

## 2020-05-02 ENCOUNTER — Other Ambulatory Visit: Payer: Self-pay | Admitting: Family Medicine

## 2020-05-03 ENCOUNTER — Other Ambulatory Visit: Payer: Self-pay | Admitting: Family Medicine

## 2020-05-03 DIAGNOSIS — I10 Essential (primary) hypertension: Secondary | ICD-10-CM

## 2020-05-03 NOTE — Telephone Encounter (Signed)
Daughter calling to ask if the dr can order pt a new nebulizer machine.  Pt got from the hospital last time, and now it is not working so good.  She says sooner is better.

## 2020-05-04 ENCOUNTER — Telehealth: Payer: Self-pay

## 2020-05-04 NOTE — Telephone Encounter (Signed)
-----   Message from [name], MD sent at 04/29/2020  7:57 AM EST ----- Well controlled cholesterol, blood counts. A1c is very elevated at 10.4.  With this high blood sugar, potassium and kidney function are slightly elevated.  Sodium is also slightly decreased.  Patient should be taking Januvia 100 mg daily, Jardiance 25 mg daily, Metformin 1000 mg twice daily.  What is her current dose of Lantus?  If she is still on 10 units daily, we should increase this to 15 units daily.  Then in 1 week, if fasting blood sugar in the morning is still above 160, we can increase further.  Insulin will bring down potassium levels, but she also needs to cut back on high carb foods and high potassium foods.  We should recheck BMP in 1 to 2 weeks.

## 2020-05-04 NOTE — Telephone Encounter (Signed)
Daughter calling to ask if the dr can order pt a new nebulizer machine.  Pt got from the hospital last time, and now it is not working so good.  She says sooner is better.

## 2020-05-07 ENCOUNTER — Other Ambulatory Visit: Payer: Self-pay | Admitting: Physician Assistant

## 2020-05-09 NOTE — Telephone Encounter (Signed)
Rx written. [facility] to [facility].

## 2020-05-09 NOTE — Telephone Encounter (Signed)
Pt's daughter called [facility] called to check status of nebulizer request, she [facility] per medicaid she says. [place] [address]    Best contact: [phone]

## 2020-05-11 NOTE — Telephone Encounter (Signed)
Prescription faxed to [facility] [phone].

## 2020-05-13 DIAGNOSIS — R3981 Functional urinary incontinence: Secondary | ICD-10-CM | POA: Diagnosis not present

## 2020-05-13 DIAGNOSIS — J45909 Unspecified asthma, uncomplicated: Secondary | ICD-10-CM | POA: Diagnosis not present

## 2020-05-14 DIAGNOSIS — J45909 Unspecified asthma, uncomplicated: Secondary | ICD-10-CM | POA: Diagnosis not present

## 2020-05-14 DIAGNOSIS — J449 Chronic obstructive pulmonary disease, unspecified: Secondary | ICD-10-CM | POA: Diagnosis not present

## 2020-05-16 ENCOUNTER — Telehealth: Payer: Self-pay

## 2020-05-16 DIAGNOSIS — E1165 Type 2 diabetes mellitus with hyperglycemia: Secondary | ICD-10-CM

## 2020-05-16 NOTE — Telephone Encounter (Signed)
Copied from [place] [phone]. Topic: General - Other >> May 16, 2020  2:18 PM Rainey Pines A wrote: Patients daughter would like a callback in regards to a status update on a new glucose meter for patient . Please advise

## 2020-05-16 NOTE — Telephone Encounter (Signed)
  Whitney Fleming (Key: [id number]) - [id number] Dexcom G6 Sensor     Status: PA Response - Denied

## 2020-05-16 NOTE — Telephone Encounter (Signed)
Unfortunately it is common for these to be denied. Could try freestyle libre Rx instead, but does not seem to be on Medicaid formularly

## 2020-05-17 NOTE — Telephone Encounter (Signed)
Left detailed message for denied coverage for meter.

## 2020-05-17 NOTE — Telephone Encounter (Signed)
Pt's daughter called and would like to try [employer] Rx, and understands insurance may not cover.

## 2020-05-18 ENCOUNTER — Ambulatory Visit: Payer: Medicaid Other

## 2020-05-18 MED ORDER — FREESTYLE LIBRE 14 DAY READER DEVI: 5 refills | Status: DC

## 2020-05-18 MED ORDER — FREESTYLE LIBRE 14 DAY SENSOR MISC: 3 refills | Status: DC

## 2020-05-18 NOTE — Telephone Encounter (Signed)
Order sent. PA will follow likely

## 2020-06-01 ENCOUNTER — Ambulatory Visit (INDEPENDENT_AMBULATORY_CARE_PROVIDER_SITE_OTHER): Payer: Medicaid Other

## 2020-06-01 ENCOUNTER — Other Ambulatory Visit: Payer: Self-pay

## 2020-06-01 DIAGNOSIS — Z23 Encounter for immunization: Secondary | ICD-10-CM | POA: Diagnosis not present

## 2020-06-01 DIAGNOSIS — I10 Essential (primary) hypertension: Secondary | ICD-10-CM | POA: Diagnosis not present

## 2020-06-02 ENCOUNTER — Other Ambulatory Visit: Payer: Self-pay | Admitting: Family Medicine

## 2020-06-02 ENCOUNTER — Other Ambulatory Visit: Payer: Self-pay | Admitting: Internal Medicine

## 2020-06-02 ENCOUNTER — Telehealth: Payer: Self-pay

## 2020-06-02 DIAGNOSIS — J4550 Severe persistent asthma, uncomplicated: Secondary | ICD-10-CM

## 2020-06-02 LAB — BASIC METABOLIC PANEL
BUN/Creatinine Ratio: 18 (ref 12–28)
BUN: 22 mg/dL (ref 8–27)
CO2: 21 mmol/L (ref 20–29)
Calcium: 8.8 mg/dL (ref 8.7–10.3)
Chloride: 95 mmol/L — ABNORMAL LOW (ref 96–106)
Creatinine, Ser: 1.22 mg/dL — ABNORMAL HIGH (ref 0.57–1.00)
GFR calc Af Amer: 50 mL/min/{1.73_m2} — ABNORMAL LOW (ref >59)
GFR calc non Af Amer: 43 mL/min/{1.73_m2} — ABNORMAL LOW (ref >59)
Glucose: 330 mg/dL — ABNORMAL HIGH (ref 65–99)
Potassium: 6.9 mmol/L (ref 3.5–5.2)
Sodium: 129 mmol/L — ABNORMAL LOW (ref 134–144)

## 2020-06-02 NOTE — Telephone Encounter (Signed)
Please advise 

## 2020-06-02 NOTE — Telephone Encounter (Signed)
Patient's daughter advised as below. Daughter is on DPR and comes in with her at every visit.

## 2020-06-02 NOTE — Telephone Encounter (Signed)
Patience with [facility] reporting critical Potassium 6.9. Given to [facility] in the practice.

## 2020-06-02 NOTE — Telephone Encounter (Signed)
Emergency room immediately.

## 2020-06-10 ENCOUNTER — Other Ambulatory Visit: Payer: Self-pay | Admitting: Physician Assistant

## 2020-06-18 ENCOUNTER — Other Ambulatory Visit: Payer: Self-pay | Admitting: Family Medicine

## 2020-06-18 ENCOUNTER — Other Ambulatory Visit: Payer: Self-pay | Admitting: Internal Medicine

## 2020-06-18 ENCOUNTER — Other Ambulatory Visit: Payer: Self-pay | Admitting: Physician Assistant

## 2020-06-18 DIAGNOSIS — J4550 Severe persistent asthma, uncomplicated: Secondary | ICD-10-CM

## 2020-06-27 ENCOUNTER — Other Ambulatory Visit: Payer: Self-pay | Admitting: Adult Health

## 2020-06-27 ENCOUNTER — Other Ambulatory Visit: Payer: Self-pay | Admitting: Family Medicine

## 2020-06-27 ENCOUNTER — Telehealth: Payer: Self-pay | Admitting: Family Medicine

## 2020-06-27 DIAGNOSIS — Z794 Long term (current) use of insulin: Secondary | ICD-10-CM

## 2020-06-27 DIAGNOSIS — E1165 Type 2 diabetes mellitus with hyperglycemia: Secondary | ICD-10-CM

## 2020-06-27 MED ORDER — CARVEDILOL 12.5 MG PO TABS: 12.5000 mg | ORAL_TABLET | Freq: Two times a day (BID) | ORAL | 1 refills | Status: DC

## 2020-06-27 MED ORDER — MONTELUKAST SODIUM 10 MG PO TABS: 10.0000 mg | ORAL_TABLET | Freq: Every day | ORAL | 1 refills | Status: DC

## 2020-06-27 MED ORDER — METFORMIN HCL 1000 MG PO TABS: 1000.0000 mg | ORAL_TABLET | Freq: Two times a day (BID) | ORAL | 1 refills | Status: DC

## 2020-06-27 MED ORDER — ACCU-CHEK FASTCLIX LANCETS MISC: 1 refills | Status: DC

## 2020-06-27 MED ORDER — LOSARTAN POTASSIUM 50 MG PO TABS: 50.0000 mg | ORAL_TABLET | Freq: Every day | ORAL | 1 refills | Status: DC

## 2020-06-27 MED ORDER — EMPAGLIFLOZIN 25 MG PO TABS: 25.0000 mg | ORAL_TABLET | Freq: Every day | ORAL | 1 refills | Status: DC

## 2020-06-27 MED ORDER — AZITHROMYCIN 250 MG PO TABS: 250.0000 mg | ORAL_TABLET | Freq: Every day | ORAL | 3 refills | Status: DC

## 2020-06-27 MED ORDER — ACCU-CHEK GUIDE VI STRP: ORAL_STRIP | 1 refills | Status: DC

## 2020-06-27 MED ORDER — FREESTYLE LIBRE 14 DAY READER DEVI: 5 refills | Status: DC

## 2020-06-27 MED ORDER — SITAGLIPTIN PHOSPHATE 100 MG PO TABS: 100.0000 mg | ORAL_TABLET | Freq: Every day | ORAL | 1 refills | Status: DC

## 2020-06-27 MED ORDER — FREESTYLE LIBRE 14 DAY SENSOR MISC: 3 refills | Status: DC

## 2020-06-27 MED ORDER — ROSUVASTATIN CALCIUM 5 MG PO TABS: 5.0000 mg | ORAL_TABLET | Freq: Every day | ORAL | 1 refills | Status: DC

## 2020-06-27 MED ORDER — AZITHROMYCIN 250 MG PO TABS: 250.0000 mg | ORAL_TABLET | Freq: Every day | ORAL | 1 refills | Status: DC

## 2020-06-27 NOTE — Progress Notes (Signed)
Refill request Meds ordered this encounter  Medications  . azithromycin (ZITHROMAX) 250 MG tablet    Sig: Take 1 tablet (250 mg total) by mouth daily. Take one tablet every Monday, Wednesday, Friday    Dispense:  13 tablet    Refill:  1  last filled by Dr. Jacinto Reap PCP.

## 2020-06-27 NOTE — Telephone Encounter (Signed)
Medication: azithromycin (ZITHROMAX) 250 MG tablet [[id number]]  ENDED  Has the patient contacted their pharmacy? YES  (Agent: If no, request that the patient contact the pharmacy for the refill.) (Agent: If yes, when and what did the pharmacy advise?)  Preferred Pharmacy (with phone number or street name): [place], [place] - [address] [place] [address] Phone: [phone] Fax: [phone] Hours: Not open 24 hours    Agent: Please be advised that RX refills may take up to 3 business days. We ask that you follow-up with your pharmacy.

## 2020-06-27 NOTE — Telephone Encounter (Signed)
Request medication last filled 12/17/18- not on current medication list. Attempted to patient/daughter- left message to call back regarding request of antibiotic.

## 2020-06-27 NOTE — Telephone Encounter (Signed)
Medication: carvedilol (COREG) 12.5 MG tablet [[id number]] , JANUVIA 100 MG tablet [[id number]] , JARDIANCE 25 MG TABS tablet [[id number]] , losartan (COZAAR) 50 MG tablet [[id number]] , metFORMIN (GLUCOPHAGE) 1000 MG tablet [[id number]] , montelukast (SINGULAIR) 10 MG tablet [[id number]] , rosuvastatin (CRESTOR) 5 MG tablet [[id number]] , Insulin Pen Needle (NOVOFINE) 32G X 6 MM MISC [[id number]] , Accu-Chek FastClix Lancets MISC [[id number]]  Has the patient contacted their pharmacy? YES  (Agent: If no, request that the patient contact the pharmacy for the refill.) (Agent: If yes, when and what did the pharmacy advise?)  (Agent: If no, request that the patient contact the pharmacy for the refill.) (Agent: If yes, when and what did the pharmacy advise?)  Preferred Pharmacy (with phone number or street name): [facility]    Agent: Please be advised that RX refills may take up to 3 business days. We ask that you follow-up with your pharmacy.

## 2020-06-27 NOTE — Telephone Encounter (Signed)
Please advise 

## 2020-06-27 NOTE — Telephone Encounter (Signed)
Patient's daughter called and says the patient has been taking Azithromycin for 3 times a week now and has been for about a year, so the pharmacy told her to call the office for a refill. I advised I will send to Dr. Brita Romp. She also says the insurance did not approve for the [facility] to check blood sugars and she needs Dr. Brita Romp to approve that as well.  [place] called and spoke to [place], [facility] about the [employer]. She says the insurance is requiring a PA and to call [phone] to give it.

## 2020-06-27 NOTE — Telephone Encounter (Signed)
Please review for Dr. B ° ° °Thanks,  ° °-Shreeya Recendiz  °

## 2020-06-27 NOTE — Telephone Encounter (Signed)
Meds ordered this encounter  Medications   azithromycin (ZITHROMAX) 250 MG tablet    Sig: Take 1 tablet (250 mg total) by mouth daily. Take one tablet every Monday, Wednesday, Friday    Dispense:  13 tablet    Refill:  1  Refilled as above. Ok to look for prior authorization once back in office or call when back in office for [employer].

## 2020-06-28 DIAGNOSIS — J45909 Unspecified asthma, uncomplicated: Secondary | ICD-10-CM | POA: Diagnosis not present

## 2020-06-28 DIAGNOSIS — R3981 Functional urinary incontinence: Secondary | ICD-10-CM | POA: Diagnosis not present

## 2020-06-28 NOTE — Telephone Encounter (Signed)
Looks like Arts development officer signed Rx yesterday.  We will keep our eyes out for PA, but we did discuss when meter was Rx'd that it may be denied by insurance

## 2020-07-08 ENCOUNTER — Telehealth: Payer: Self-pay | Admitting: Internal Medicine

## 2020-07-08 ENCOUNTER — Other Ambulatory Visit: Payer: Self-pay

## 2020-07-08 DIAGNOSIS — J4550 Severe persistent asthma, uncomplicated: Secondary | ICD-10-CM

## 2020-07-08 MED ORDER — BUDESONIDE-FORMOTEROL FUMARATE 160-4.5 MCG/ACT IN AERO: 2.0000 | INHALATION_SPRAY | Freq: Two times a day (BID) | RESPIRATORY_TRACT | 0 refills | Status: DC

## 2020-07-08 MED ORDER — ALBUTEROL SULFATE (2.5 MG/3ML) 0.083% IN NEBU: INHALATION_SOLUTION | RESPIRATORY_TRACT | 2 refills | Status: DC

## 2020-07-08 NOTE — Telephone Encounter (Signed)
90 day supply of Symbicort 160 has been sent to preferred pharmacy.  Patient's spouse, [name](DPR) is aware and voiced her understanding.  OV scheduled for 07/13/2020, as patient is past due for an appointment.  Nothing further needed.

## 2020-07-08 NOTE — Telephone Encounter (Signed)
PA for Symbicort 160mg  has been started via CMM.   QIH:[id number]  Will await determination.

## 2020-07-11 NOTE — Telephone Encounter (Signed)
PA has been approved per CMM.   [facility] pharmacy and they are closed until 9:00a

## 2020-07-11 NOTE — Telephone Encounter (Signed)
Crystal with [facility] is aware of approval and voiced her understanding.  Nothing further needed.

## 2020-07-13 ENCOUNTER — Ambulatory Visit (INDEPENDENT_AMBULATORY_CARE_PROVIDER_SITE_OTHER): Payer: Medicaid Other | Admitting: Primary Care

## 2020-07-13 ENCOUNTER — Other Ambulatory Visit: Payer: Self-pay

## 2020-07-13 ENCOUNTER — Encounter: Payer: Self-pay | Admitting: Primary Care

## 2020-07-13 DIAGNOSIS — J4551 Severe persistent asthma with (acute) exacerbation: Secondary | ICD-10-CM

## 2020-07-13 MED ORDER — FLUTICASONE PROPIONATE 50 MCG/ACT NA SUSP: 1.0000 | Freq: Every day | NASAL | 2 refills | Status: DC

## 2020-07-13 MED ORDER — OMEPRAZOLE 20 MG PO CPDR: 20.0000 mg | DELAYED_RELEASE_CAPSULE | Freq: Two times a day (BID) | ORAL | 1 refills | Status: DC

## 2020-07-13 MED ORDER — PREDNISONE 10 MG PO TABS: ORAL_TABLET | ORAL | 0 refills | Status: DC

## 2020-07-13 NOTE — Progress Notes (Signed)
_0  ID: Whitney Fleming, female    DOB: [birth year], 75 y.o.   MRN: [id number]  Chief Complaint  Patient presents with  . Follow-up    C/o Sob with exertion, non prod cough with and wheezing.     Referring provider: [name], MD  HPI: 75 year old female, never smoked. PMH significant HTN, asthma, respiratory failure, GERD, type 2 diabetes, hyperlipidemia. Patient of Dr. Mortimer Fries, last seen in office on 09/29/19.   07/13/2020 Patient presents today for an overdue follow-up for asthma. During last visit theophylline was discontinued. Reports symptoms of dyspnea on exertion with associated np cough and wheezing. These symptoms are chronic in nature. She is compliant with symbicort 160 twice a day as prescribed along with Azithromycin MWF and Singulair 68m qhs. CAT score 22.   No Known Allergies  Immunization History  Administered Date(s) Administered  . Influenza, High Dose Seasonal PF 02/25/2018  . PFIZER(Purple Top)SARS-COV-2 Vaccination 07/17/2019, 08/06/2019, 06/01/2020  . Pneumococcal Conjugate-13 04/28/2020  . Pneumococcal Polysaccharide-23 01/14/2018    Past Medical History:  Diagnosis Date  . Anemia    normocytic anemia  . Asthma   . Cardiomegaly   . [place] obstructive [place] disease) [place]   . Diabetes mellitus without complication [place]   . Hypertension   . Hyponatremia     Tobacco History: Social History   Tobacco Use  Smoking Status Never Smoker  Smokeless Tobacco Never Used   Counseling given: Not Answered   Outpatient Medications Prior to Visit  Medication Sig Dispense Refill  . Accu-Chek FastClix Lancets MISC Use as directed to check blood glucose twice daily 100 each 1  . albuterol (PROVENTIL) (2.5 MG/3ML) 0.083% nebulizer solution USE 1 VIAL IN NEBULIZER THREE TIMES DAILY 225 mL 2  . AMBULATORY NON FORMULARY MEDICATION Medication Name: Aero chamber use as directed. DX:J45.909 1 each 0  . aspirin EC 81 MG tablet Take 1 tablet (81  mg total) by mouth daily. Swallow whole. 90 tablet 3  . azithromycin (ZITHROMAX) 250 MG tablet Take 1 tablet (250 mg total) by mouth daily. Take one tablet every Monday, Wednesday, Friday 13 tablet 3  . Blood Glucose Monitoring Suppl (ACCU-CHEK GUIDE) w/Device KIT USE TO TEST BLOOD SUGAR TWICE DAILY 1 kit 0  . carvedilol (COREG) 12.5 MG tablet Take 1 tablet (12.5 mg total) by mouth 2 (two) times daily with a meal. 180 tablet 1  . Continuous Blood Gluc Receiver (FREESTYLE LIBRE 14 DAY READER) DEVI Use to test blood glucose throughout the day, continuously 1 each 5  . Continuous Blood Gluc Sensor (FREESTYLE LIBRE 14 DAY SENSOR) MISC Use for 14 days to monitor glucose continuously 6 each 3  . dextromethorphan-guaiFENesin (MUCINEX DM) 30-600 MG 12hr tablet Take 1 tablet by mouth 2 (two) times daily. 15 tablet 0  . empagliflozin (JARDIANCE) 25 MG TABS tablet Take 1 tablet (25 mg total) by mouth daily. 90 tablet 1  . glucose blood (ACCU-CHEK GUIDE) test strip USE AS DIRECTED TWICE DAILY 50 each 1  . guaiFENesin (MUCINEX) 600 MG 12 hr tablet Take 1 tablet (600 mg total) by mouth 2 (two) times daily. 60 tablet 1  . insulin glargine (LANTUS SOLOSTAR) 100 UNIT/ML Solostar Pen Inject 10 Units into the skin daily. 5 pen 1  . Insulin Pen Needle (NOVOFINE) 32G X 6 MM MISC Use as directed to inject insulin daily 100 each 2  . losartan (COZAAR) 50 MG tablet Take 1 tablet (50 mg total) by mouth daily. 90 tablet 1  .  metFORMIN (GLUCOPHAGE) 1000 MG tablet Take 1 tablet (1,000 mg total) by mouth 2 (two) times daily with a meal. 180 tablet 1  . montelukast (SINGULAIR) 10 MG tablet Take 1 tablet (10 mg total) by mouth at bedtime. 90 tablet 1  . rosuvastatin (CRESTOR) 5 MG tablet Take 1 tablet (5 mg total) by mouth daily. 90 tablet 1  . sitaGLIPtin (JANUVIA) 100 MG tablet Take 1 tablet (100 mg total) by mouth daily. 90 tablet 1  . azithromycin (ZITHROMAX) 250 MG tablet Take 1 tablet (250 mg total) by mouth daily. Take one  tablet every Monday, Wednesday, Friday 13 tablet 1  . budesonide-formoterol (SYMBICORT) 160-4.5 MCG/ACT inhaler Inhale 2 puffs into the lungs 2 (two) times daily. Rinse mouth after use 3 each 0   No facility-administered medications prior to visit.    Review of Systems  Review of Systems  Constitutional: Negative.   Respiratory: Positive for cough, shortness of breath and wheezing.   Cardiovascular: Negative.   Musculoskeletal: Negative.    Physical Exam  BP 112/60 (BP Location: Left Arm, Cuff Size: Normal)   Pulse 86   Temp 97.9 F (36.6 C) (Temporal)   Ht 4' 10" (1.473 m)   Wt 165 lb 9.6 oz (75.1 kg)   SpO2 97%   BMI 34.61 kg/m  Physical Exam Constitutional:      Appearance: Normal appearance.  Cardiovascular:     Rate and Rhythm: Normal rate.  Pulmonary:     Effort: Pulmonary effort is normal.     Breath sounds: Wheezing present.  Neurological:     General: No focal deficit present.     Mental Status: She is alert and oriented to person, place, and time. Mental status is at baseline.  Psychiatric:        Mood and Affect: Mood normal.        Behavior: Behavior normal.        Thought Content: Thought content normal.      Lab Results:  CBC    Component Value Date/Time   WBC 9.7 04/28/2020 1528   WBC 11.5 (H) 10/29/2019 0325   RBC 5.09 04/28/2020 1528   RBC 5.25 (H) 10/29/2019 0325   HGB 12.7 04/28/2020 1528   HCT 42.3 04/28/2020 1528   PLT 247 04/28/2020 1528   MCV 83 04/28/2020 1528   MCV 79 (L) 12/25/2012 0154   MCH 25.0 (L) 04/28/2020 1528   MCH 25.9 (L) 10/29/2019 0325   MCHC 30.0 (L) 04/28/2020 1528   MCHC 32.3 10/29/2019 0325   RDW 13.1 04/28/2020 1528   RDW 13.4 12/25/2012 0154   LYMPHSABS 2.9 04/28/2020 1528   LYMPHSABS 0.7 (L) 12/25/2012 0154   MONOABS 1.1 (H) 08/17/2019 2125   MONOABS 0.1 (L) 12/25/2012 0154   EOSABS 0.6 (H) 04/28/2020 1528   EOSABS 0.0 12/25/2012 0154   BASOSABS 0.1 04/28/2020 1528   BASOSABS 0.0 12/25/2012 0154     BMET    Component Value Date/Time   NA 129 (L) 06/01/2020 1414   NA 133 (L) 12/26/2012 0545   K 6.9 (HH) 06/01/2020 1414   K 4.7 12/26/2012 0545   CL 95 (L) 06/01/2020 1414   CL 105 12/26/2012 0545   CO2 21 06/01/2020 1414   CO2 16 (L) 12/26/2012 0545   GLUCOSE 330 (H) 06/01/2020 1414   GLUCOSE 248 (H) 10/29/2019 0325   GLUCOSE 389 (H) 12/26/2012 0545   BUN 22 06/01/2020 1414   BUN 24 (H) 12/26/2012 0545   CREATININE 1.22 (H)  06/01/2020 1414   CREATININE 1.16 12/26/2012 0545   CALCIUM 8.8 06/01/2020 1414   CALCIUM 6.9 (LL) 12/26/2012 0545   GFRNONAA 43 (L) 06/01/2020 1414   GFRNONAA 48 (L) 12/26/2012 0545   GFRAA 50 (L) 06/01/2020 1414   GFRAA 56 (L) 12/26/2012 0545    BNP    Component Value Date/Time   BNP 164.0 (H) 08/17/2019 2125    ProBNP No results found for: PROBNP  Imaging: No results found.   Assessment & Plan:   Asthma exacerbation - Chronic cough/wheezing with acute exacerbation. PND and GERD likely contributing to symptoms. - Recommend prednisone taper (31m x2 days; 367mx 2 days; 2020m 2 days; 42m74m2 days) , Flonase nasal spray and Omeprazole 20mg52m x 4-6 weeks.  - Continue Azithromycin Monday/Wednesday/Friday - Continue Symbicort 160 two puffs morning and evening (use with aerochamber); albuterol 2 puffs every 6 hours for breakthrough shortness of breath/wheezing - Continue Singulair 42mg 65m - Regular follow-up with Dr. Kasa iMortimer Friesmonths or sooner if symptoms do not improve/worsen   ElizabMartyn Ehrich/17/2022

## 2020-07-13 NOTE — Patient Instructions (Addendum)
Nice meeting you today  Recommendations - Start omeprazole 20mg  twice a day for reflux x 4-6 weeks  - Start Flonase nasal spray 1 puff per nostril for nasal congestion - Take prednisone taper as prescribed - Continue Azithromycin Monday/WEDNESDAY/FRIDAY  - Continue to use Symbicort - take two puffs morning and evening (use with aerochamber) - Use albuterol 2 puffs every 6 hours for breakthrough shortness of breath/wheezing  Follow-up: - 4-6 months with Dr. Mortimer Fries    Asthma, Adult  Asthma is a long-term (chronic) condition that causes recurrent episodes in which the airways become tight and narrow. The airways are the passages that lead from the nose and mouth down into the lungs. Asthma episodes, also called asthma attacks, can cause coughing, wheezing, shortness of breath, and chest pain. The airways can also fill with mucus. During an attack, it can be difficult to breathe. Asthma attacks can range from minor to life threatening. Asthma cannot be cured, but medicines and lifestyle changes can help control it and treat acute attacks. What are the causes? This condition is believed to be caused by inherited (genetic) and environmental factors, but its exact cause is not known. There are many things that can bring on an asthma attack or make asthma symptoms worse (triggers). Asthma triggers are different for each person. Common triggers include:  Mold.  Dust.  Cigarette smoke.  Cockroaches.  Things that can cause allergy symptoms (allergens), such as animal dander or pollen from trees or grass.  Air pollutants such as household cleaners, wood smoke, smog, or Advertising account planner.  Cold air, weather changes, and winds (which increase molds and pollen in the air).  Strong emotional expressions such as crying or laughing hard.  Stress.  Certain medicines (such as aspirin) or types of medicines (such as beta-blockers).  Sulfites in foods and drinks. Foods and drinks that may contain  sulfites include dried fruit, potato chips, and sparkling grape juice.  Infections or inflammatory conditions such as the flu, a cold, or inflammation of the nasal membranes (rhinitis).  Gastroesophageal reflux disease (GERD).  Exercise or strenuous activity. What are the signs or symptoms? Symptoms of this condition may occur right after asthma is triggered or many hours later. Symptoms include:  Wheezing. This can sound like whistling when you breathe.  Excessive nighttime or early morning coughing.  Frequent or severe coughing with a common cold.  Chest tightness.  Shortness of breath.  Tiredness (fatigue) with minimal activity. How is this diagnosed? This condition is diagnosed based on:  Your medical history.  A physical exam.  Tests, which may include: ? Lung function studies and pulmonary studies (spirometry). These tests can evaluate the flow of air in your lungs. ? Allergy tests. ? Imaging tests, such as X-rays. How is this treated? There is no cure for this condition, but treatment can help control your symptoms. Treatment for asthma usually involves:  Identifying and avoiding your asthma triggers.  Using medicines to control your symptoms. Generally, two types of medicines are used to treat asthma: ? Controller medicines. These help prevent asthma symptoms from occurring. They are usually taken every day. ? Fast-acting reliever or rescue medicines. These quickly relieve asthma symptoms by widening the narrow and tight airways. They are used as needed and provide short-term relief.  Using supplemental oxygen. This may be needed during a severe episode.  Using other medicines, such as: ? Allergy medicines, such as antihistamines, if your asthma attacks are triggered by allergens. ? Immune medicines (immunomodulators). These are medicines  that help control the immune system.  Creating an asthma action plan. An asthma action plan is a written plan for managing  and treating your asthma attacks. This plan includes: ? A list of your asthma triggers and how to avoid them. ? Information about when medicines should be taken and when their dosage should be changed. ? Instructions about using a device called a peak flow meter. A peak flow meter measures how well the lungs are working and the severity of your asthma. It helps you monitor your condition. Follow these instructions at home: Controlling your home environment Control your home environment in the following ways to help avoid triggers and prevent asthma attacks:  Change your heating and air conditioning filter regularly.  Limit your use of fireplaces and wood stoves.  Get rid of pests (such as roaches and mice) and their droppings.  Throw away plants if you see mold on them.  Clean floors and dust surfaces regularly. Use unscented cleaning products.  Try to have someone else vacuum for you regularly. Stay out of rooms while they are being vacuumed and for a short while afterward. If you vacuum, use a dust mask from a hardware store, a double-layered or microfilter vacuum cleaner bag, or a vacuum cleaner with a HEPA filter.  Replace carpet with wood, tile, or vinyl flooring. Carpet can trap dander and dust.  Use allergy-proof pillows, mattress covers, and box spring covers.  Keep your bedroom a trigger-free room.  Avoid pets and keep windows closed when allergens are in the air.  Wash beddings every week in hot water and dry them in a dryer.  Use blankets that are made of polyester or cotton.  Clean bathrooms and kitchens with bleach. If possible, have someone repaint the walls in these rooms with mold-resistant paint. Stay out of the rooms that are being cleaned and painted.  Wash your hands often with soap and water. If soap and water are not available, use hand sanitizer.  Do not allow anyone to smoke in your home. General instructions  Take over-the-counter and prescription  medicines only as told by your health care provider. ? Speak with your health care provider if you have questions about how or when to take the medicines. ? Make note if you are requiring more frequent dosages.  Do not use any products that contain nicotine or tobacco, such as cigarettes and e-cigarettes. If you need help quitting, ask your health care provider. Also, avoid being exposed to secondhand smoke.  Use a peak flow meter as told by your health care provider. Record and keep track of the readings.  Understand and use the asthma action plan to help minimize, or stop an asthma attack, without needing to seek medical care.  Make sure you stay up to date on your yearly vaccinations as told by your health care provider. This may include vaccines for the flu and pneumonia.  Avoid outdoor activities when allergen counts are high and when air quality is low.  Wear a ski mask that covers your nose and mouth during outdoor winter activities. Exercise indoors on cold days if you can.  Warm up before exercising, and take time for a cool-down period after exercise.  Keep all follow-up visits as told by your health care provider. This is important. Where to find more information  For information about asthma, turn to the Centers for Disease Control and Prevention at http://www.clark.net/  For air quality information, turn to [employer] at https://www.miller-reyes.info/ Contact a health care  provider if:  You have wheezing, shortness of breath, or a cough even while you are taking medicine to prevent attacks.  The mucus you cough up (sputum) is thicker than usual.  Your sputum changes from clear or white to yellow, green, gray, or bloody.  Your medicines are causing side effects, such as a rash, itching, swelling, or trouble breathing.  You need to use a reliever medicine more than 2-3 times a week.  Your peak flow reading is still at 50-79% of your personal best after following your action plan for 1  hour.  You have a fever. Get help right away if:  You are getting worse and do not respond to treatment during an asthma attack.  You are short of breath when at rest or when doing very little physical activity.  You have difficulty eating, drinking, or talking.  You have chest pain or tightness.  You develop a fast heartbeat or palpitations.  You have a bluish color to your lips or fingernails.  You are light-headed or dizzy, or you faint.  Your peak flow reading is less than 50% of your personal best.  You feel too tired to breathe normally. Summary  Asthma is a long-term (chronic) condition that causes recurrent episodes in which the airways become tight and narrow. These episodes can cause coughing, wheezing, shortness of breath, and chest pain.  Asthma cannot be cured, but medicines and lifestyle changes can help control it and treat acute attacks.  Make sure you understand how to avoid triggers and how and when to use your medicines.  Asthma attacks can range from minor to life threatening. Get help right away if you have an asthma attack and do not respond to treatment with your usual rescue medicines. This information is not intended to replace advice given to you by your health care provider. Make sure you discuss any questions you have with your health care provider. Document Revised: 02/26/2020 Document Reviewed: 09/30/2019 Elsevier Patient Education  2021 [employer].

## 2020-07-22 ENCOUNTER — Telehealth: Payer: Self-pay | Admitting: Internal Medicine

## 2020-07-22 DIAGNOSIS — J4550 Severe persistent asthma, uncomplicated: Secondary | ICD-10-CM

## 2020-07-22 MED ORDER — BUDESONIDE-FORMOTEROL FUMARATE 160-4.5 MCG/ACT IN AERO: 2.0000 | INHALATION_SPRAY | Freq: Two times a day (BID) | RESPIRATORY_TRACT | 3 refills | Status: DC

## 2020-07-22 NOTE — Telephone Encounter (Signed)
Rx for Symbicort 160 has been sent to preferred pharmacy.  Patient's daughter, [name]) is aware and voiced her understanding.  Nothing further needed.

## 2020-07-28 ENCOUNTER — Encounter: Payer: Self-pay | Admitting: Primary Care

## 2020-07-28 NOTE — Assessment & Plan Note (Addendum)
-   Chronic cough/wheezing with acute exacerbation. PND and GERD likely contributing to symptoms. - Recommend prednisone taper (40mg  x2 days; 30mg  x 2 days; 20mg  x 2 days; 10mg  x 2 days) , Flonase nasal spray and Omeprazole 20mg  BID x 4-6 weeks.  - Continue Azithromycin Monday/Wednesday/Friday - Continue Symbicort 160 two puffs morning and evening (use with aerochamber); albuterol 2 puffs every 6 hours for breakthrough shortness of breath/wheezing - Continue Singulair 10mg  qhs  - Regular follow-up with Dr. Mortimer Fries in 4 months or sooner if symptoms do not improve/worsen

## 2020-08-02 ENCOUNTER — Other Ambulatory Visit: Payer: Self-pay

## 2020-08-02 ENCOUNTER — Encounter: Payer: Self-pay | Admitting: Family Medicine

## 2020-08-02 ENCOUNTER — Ambulatory Visit (INDEPENDENT_AMBULATORY_CARE_PROVIDER_SITE_OTHER): Payer: Medicaid Other | Admitting: Family Medicine

## 2020-08-02 VITALS — BP 124/75 | HR 83 | Ht 59.0 in | Wt 168.0 lb

## 2020-08-02 DIAGNOSIS — E1165 Type 2 diabetes mellitus with hyperglycemia: Secondary | ICD-10-CM | POA: Diagnosis not present

## 2020-08-02 DIAGNOSIS — E875 Hyperkalemia: Secondary | ICD-10-CM

## 2020-08-02 LAB — POCT GLYCOSYLATED HEMOGLOBIN (HGB A1C)
Est. average glucose Bld gHb Est-mCnc: 252
Hemoglobin A1C: 10.4 % — AB (ref 4.0–5.6)

## 2020-08-02 MED ORDER — LANTUS SOLOSTAR 100 UNIT/ML ~~LOC~~ SOPN: 10.0000 [IU] | PEN_INJECTOR | Freq: Every day | SUBCUTANEOUS | 5 refills | Status: DC

## 2020-08-02 NOTE — Patient Instructions (Signed)
Food Basics for Chronic Kidney Disease Chronic kidney disease (CKD) occurs when the kidneys are permanently damaged over a long period of time. When your kidneys are not working well, they cannot remove waste, fluids, and other substances from your blood as well as they did before. The substances can build up, which can worsen kidney damage and affect how your body functions. Certain foods lead to a buildup of these substances. By changing your diet, you can help prevent more kidney damage and delay or prevent the need for dialysis. What are tips for following this plan? Reading food labels  Check the amount of salt (sodium) in foods. Choose foods that have less than 300 milligrams (mg) per serving.  Check the ingredient list for phosphorus or potassium-based additives or preservatives.  Check the amount of saturated fat and trans fat. Limit or avoid these fats as told by your dietitian. Shopping  Avoid buying foods that are: ? Processed or prepackaged. ? Calcium-enriched or that have calcium added to them (are fortified).  Do not buy foods that have salt or sodium listed among the first five ingredients.  Buy canned vegetables and beans that say "no salt added" or "low sodium" and rinse them before eating. Cooking  Soak vegetables, such as potatoes, before cooking to reduce potassium. To do this: 1. Peel and cut the vegetables into small pieces. 2. Soak the vegetables in warm water for at least 2 hours. For every 1 cup of vegetables, use 10 cups of water. 3. Drain and rinse the vegetables with warm water. 4. Boil the vegetables for at least 5 minutes. Meal planning  Limit the amount of protein you eat from plant and animal sources each day.  Do not add salt to food when cooking or before eating.  Eat meals and snacks at around the same time each day. General information  Talk with your health care provider about whether you should take a vitamin and mineral supplement.  Use  standard measuring cups and spoons to measure servings of foods. Use a kitchen scale to measure portions of protein foods.  If told by your health care provider, avoid drinking too much fluid. Measure and count all liquids, including water, ice, soups, flavored gelatin, and frozen desserts such as ice pops or ice cream. If you have diabetes:  If you have diabetes (diabetes mellitus) and CKD, it is important to keep your blood sugar (glucose) in the target range recommended by your health care provider. Follow your diabetes management plan. This may include: ? Checking your blood glucose regularly. ? Taking medicines by mouth, taking insulin, or taking both. ? Exercising for at least 30 minutes on 5 or more days each week, or as told by your health care provider. ? Tracking how many servings of carbohydrates you eat at each meal.  You may be given specific guidelines on how much of certain foods and nutrients you may eat, depending on your stage of kidney disease and whether you have high blood pressure (hypertension). Follow your meal plan as told by your dietitian. What nutrients should I limit? Work with your health care provider and dietitian to develop a meal plan that is right for you. Foods you can eat and foods you should limit or avoid will depend on the stage of your kidney disease and any other health conditions you have. The items listed below are not a complete list. Talk with your dietitian about what dietary choices are best for you. Potassium Potassium affects how steadily   your heart beats. If too much potassium builds up in your blood, the potassium can cause an irregular heartbeat or even a heart attack. You may need to limit or avoid foods that are high in potassium, such as:  Milk and soy milk.  Fruits, such as bananas, apricots, nectarines, melon, prunes, raisins, kiwi, and oranges.  Vegetables, such as potatoes, sweet potatoes, yams, tomatoes, leafy greens, beets, avocado,  pumpkin, and winter squash.  White and lima beans.  Whole-wheat breads and pastas.  Beans and nuts. Phosphorus Phosphorus is a mineral found in your bones. A balance between calcium and phosphorus is needed to build and maintain healthy bones. Too much phosphorus pulls calcium from your bones. This can make your bones weak and more likely to break. Too much phosphorus can also make your skin itch. You may need to limit or avoid foods that are high in phosphorus, such as:  Milk and dairy products.  Dried beans and peas.  Tofu, soy milk, and other soy-based meat replacements.  Dark-colored sodas.  Nuts and peanut butter.  Meat, poultry, and fish.  Bran cereals and oatmeal. Protein Protein helps you make and keep muscle. It also helps to repair your body's cells and tissues. One of the natural breakdown products of protein is a waste product called urea. When your kidneys are not working properly, they cannot remove wastes, such as urea. Reducing how much protein you eat can help prevent a buildup of urea in your blood. Depending on your stage of kidney disease, you may need to limit foods that are high in protein. Sources of animal protein include:  Meat (all types).  Fish and seafood.  Poultry.  Eggs.  Dairy. Other protein foods include:  Beans and legumes.  Nuts and nut butter.  Soy and tofu.   Sodium Sodium helps to maintain a healthy balance of fluids in your body. Too much sodium can increase your blood pressure and have a negative effect on your heart and lungs. Too much sodium can also cause your body to retain too much fluid, making your kidneys work harder. Most people should have less than 2,300 mg of sodium each day. If you have hypertension, you may need to limit your sodium to 1,500 mg each day. You may need to limit or avoid foods that are high in sodium, such as:  Salt seasonings.  Soy sauce.  Cured and processed meats.  Salted crackers and snack  foods.  Fast food.  Canned soups and most canned foods.  Pickled foods.  Vegetable juice.  Boxed mixes or ready-to-eat boxed meals and side dishes.  Bottled dressings, sauces, and marinades. Talk with your dietitian about how much potassium, phosphorus, protein, and sodium you may have each day. Summary  Chronic kidney disease (CKD) can lead to a buildup of waste and extra substances in the body. Certain foods lead to a buildup of these substances. By changing your diet as told, you can help prevent more kidney damage and delay or prevent the need for dialysis.  Food intake changes are different for each person with CKD. Work with a dietitian to set up nutrient goals and a meal plan that is right for you.  If you have diabetes and CKD, it is important to keep your blood sugar in the target range recommended by your health care provider. This information is not intended to replace advice given to you by your health care provider. Make sure you discuss any questions you have with your health care   provider. Document Revised: 09/21/2019 Document Reviewed: 09/21/2019 Elsevier Patient Education  2021 [place].    https://www.diabeteseducator.org/docs/default-source/living-with-diabetes/conquering-the-grocery-store-v1.pdf?sfvrsn=4">  Carbohydrate Counting for Diabetes Mellitus, Adult Carbohydrate counting is a method of keeping track of how many carbohydrates you eat. Eating carbohydrates naturally increases the amount of sugar (glucose) in the blood. Counting how many carbohydrates you eat improves your blood glucose control, which helps you manage your diabetes. It is important to know how many carbohydrates you can safely have in each meal. This is different for every person. A dietitian can help you make a meal plan and calculate how many carbohydrates you should have at each meal and snack. What foods contain carbohydrates? Carbohydrates are found in the following foods:  Grains,  such as breads and cereals.  Dried beans and soy products.  Starchy vegetables, such as potatoes, peas, and corn.  Fruit and fruit juices.  Milk and yogurt.  Sweets and snack foods, such as cake, cookies, candy, chips, and soft drinks.   How do I count carbohydrates in foods? There are two ways to count carbohydrates in food. You can read food labels or learn standard serving sizes of foods. You can use either of the methods or a combination of both. Using the Nutrition Facts label The Nutrition Facts list is included on the labels of almost all packaged foods and beverages in the [place] It includes:  The serving size.  Information about nutrients in each serving, including the grams (g) of carbohydrate per serving. To use the Nutrition Facts:  Decide how many servings you will have.  Multiply the number of servings by the number of carbohydrates per serving.  The resulting number is the total amount of carbohydrates that you will be having. Learning the standard serving sizes of foods When you eat carbohydrate foods that are not packaged or do not include Nutrition Facts on the label, you need to measure the servings in order to count the amount of carbohydrates.  Measure the foods that you will eat with a food scale or measuring cup, if needed.  Decide how many standard-size servings you will eat.  Multiply the number of servings by 15. For foods that contain carbohydrates, one serving equals 15 g of carbohydrates. ? For example, if you eat 2 cups or 10 oz (300 g) of strawberries, you will have eaten 2 servings and 30 g of carbohydrates (2 servings x 15 g = 30 g).  For foods that have more than one food mixed, such as soups and casseroles, you must count the carbohydrates in each food that is included. The following list contains standard serving sizes of common carbohydrate-rich foods. Each of these servings has about 15 g of carbohydrates:  1 slice of bread.  1 six-inch (15  cm) tortilla.  ? cup or 2 oz (53 g) cooked rice or pasta.   cup or 3 oz (85 g) cooked or canned, drained and rinsed beans or lentils.   cup or 3 oz (85 g) starchy vegetable, such as peas, corn, or squash.   cup or 4 oz (120 g) hot cereal.   cup or 3 oz (85 g) boiled or mashed potatoes, or  or 3 oz (85 g) of a large baked potato.   cup or 4 fl oz (118 mL) fruit juice.  1 cup or 8 fl oz (237 mL) milk.  1 small or 4 oz (106 g) apple.   or 2 oz (63 g) of a medium banana.  1 cup or 5 oz (  150 g) strawberries.  3 cups or 1 oz (24 g) popped popcorn. What is an example of carbohydrate counting? To calculate the number of carbohydrates in this sample meal, follow the steps shown below. Sample meal  3 oz (85 g) chicken breast.  ? cup or 4 oz (106 g) brown rice.   cup or 3 oz (85 g) corn.  1 cup or 8 fl oz (237 mL) milk.  1 cup or 5 oz (150 g) strawberries with sugar-free whipped topping. Carbohydrate calculation 1. Identify the foods that contain carbohydrates: ? Rice. ? Corn. ? Milk. ? Strawberries. 2. Calculate how many servings you have of each food: ? 2 servings rice. ? 1 serving corn. ? 1 serving milk. ? 1 serving strawberries. 3. Multiply each number of servings by 15 g: ? 2 servings rice x 15 g = 30 g. ? 1 serving corn x 15 g = 15 g. ? 1 serving milk x 15 g = 15 g. ? 1 serving strawberries x 15 g = 15 g. 4. Add together all of the amounts to find the total grams of carbohydrates eaten: ? 30 g + 15 g + 15 g + 15 g = 75 g of carbohydrates total. What are tips for following this plan? Shopping  Develop a meal plan and then make a shopping list.  Buy fresh and frozen vegetables, fresh and frozen fruit, dairy, eggs, beans, lentils, and whole grains.  Look at food labels. Choose foods that have more fiber and less sugar.  Avoid processed foods and foods with added sugars. Meal planning  Aim to have the same amount of carbohydrates at each meal and for  each snack time.  Plan to have regular, balanced meals and snacks. Where to find more information  American Diabetes Association: www.diabetes.org  Centers for Disease Control and Prevention: http://www.wolf.info/ Summary  Carbohydrate counting is a method of keeping track of how many carbohydrates you eat.  Eating carbohydrates naturally increases the amount of sugar (glucose) in the blood.  Counting how many carbohydrates you eat improves your blood glucose control, which helps you manage your diabetes.  A dietitian can help you make a meal plan and calculate how many carbohydrates you should have at each meal and snack. This information is not intended to replace advice given to you by your health care provider. Make sure you discuss any questions you have with your health care provider. Document Revised: 05/28/2019 Document Reviewed: 05/29/2019 Elsevier Patient Education  2021 [employer].

## 2020-08-02 NOTE — Progress Notes (Signed)
Established patient visit   Patient: Whitney Fleming   DOB: [birth year]   74 y.o. Female  MRN: [id number] Visit Date: 08/02/2020  Today's healthcare provider: Lavon Paganini, MD   Chief Complaint  Patient presents with  . Diabetes   Subjective    HPI  Diabetes Mellitus Type II, Follow-up  Lab Results  Component Value Date   HGBA1C 10.4 (A) 08/02/2020   HGBA1C 10.4 (H) 04/28/2020   HGBA1C 7.9 (H) 08/18/2019   Wt Readings from Last 3 Encounters:  08/02/20 168 lb (76.2 kg)  07/13/20 165 lb 9.6 oz (75.1 kg)  04/28/20 166 lb (75.3 kg)   Last seen for diabetes 3 months ago.  Management since then includes checking a1c. She reports excellent compliance with treatment. She is not having side effects.  Symptoms: Yes fatigue No foot ulcerations  Yes appetite changes No nausea  No paresthesia of the feet  Yes polydipsia  No polyuria Yes visual disturbances   No vomiting     Home blood sugar records: approximately 200 not fasting  Episodes of hypoglycemia? No    Current insulin regiment: 5 qd and 1-2 times a day Most Recent Eye Exam: approximately a year ago Current exercise: none Current diet habits: well balanced  Pertinent Labs: Lab Results  Component Value Date   CHOL 127 04/28/2020   HDL 49 04/28/2020   LDLCALC 54 04/28/2020   TRIG 138 04/28/2020   CHOLHDL 2.6 04/28/2020   Lab Results  Component Value Date   NA 131 (L) 08/02/2020   K 5.8 (H) 08/02/2020   CREATININE 1.32 (H) 08/02/2020   GFRNONAA 39 (L) 08/02/2020   GFRAA 45 (L) 08/02/2020   GLUCOSE 306 (H) 08/02/2020     ---------------------------------------------------------------------------------------------------      Medications: Outpatient Medications Prior to Visit  Medication Sig  . Accu-Chek FastClix Lancets MISC Use as directed to check blood glucose twice daily  . albuterol (PROVENTIL) (2.5 MG/3ML) 0.083% nebulizer solution USE 1 VIAL IN NEBULIZER THREE TIMES DAILY  . AMBULATORY  NON FORMULARY MEDICATION Medication Name: Aero chamber use as directed. IO:M35.597  . aspirin EC 81 MG tablet Take 1 tablet (81 mg total) by mouth daily. Swallow whole.  . budesonide-formoterol (SYMBICORT) 160-4.5 MCG/ACT inhaler Inhale 2 puffs into the lungs 2 (two) times daily. Rinse mouth after use  . carvedilol (COREG) 12.5 MG tablet Take 1 tablet (12.5 mg total) by mouth 2 (two) times daily with a meal.  . Continuous Blood Gluc Receiver (FREESTYLE LIBRE 14 DAY READER) DEVI Use to test blood glucose throughout the day, continuously  . Continuous Blood Gluc Sensor (FREESTYLE LIBRE 14 DAY SENSOR) MISC Use for 14 days to monitor glucose continuously  . empagliflozin (JARDIANCE) 25 MG TABS tablet Take 1 tablet (25 mg total) by mouth daily.  . fluticasone (FLONASE) 50 MCG/ACT nasal spray Place 1 spray into both nostrils daily.  Marland Kitchen glucose blood (ACCU-CHEK GUIDE) test strip USE AS DIRECTED TWICE DAILY  . guaiFENesin (MUCINEX) 600 MG 12 hr tablet Take 1 tablet (600 mg total) by mouth 2 (two) times daily.  . Insulin Pen Needle (NOVOFINE) 32G X 6 MM MISC Use as directed to inject insulin daily  . losartan (COZAAR) 50 MG tablet Take 1 tablet (50 mg total) by mouth daily.  . metFORMIN (GLUCOPHAGE) 1000 MG tablet Take 1 tablet (1,000 mg total) by mouth 2 (two) times daily with a meal.  . montelukast (SINGULAIR) 10 MG tablet Take 1 tablet (10 mg total) by mouth at  bedtime.  Marland Kitchen omeprazole (PRILOSEC) 20 MG capsule Take 1 capsule (20 mg total) by mouth 2 (two) times daily before a meal.  . rosuvastatin (CRESTOR) 5 MG tablet Take 1 tablet (5 mg total) by mouth daily.  . sitaGLIPtin (JANUVIA) 100 MG tablet Take 1 tablet (100 mg total) by mouth daily.  . [DISCONTINUED] Blood Glucose Monitoring Suppl (ACCU-CHEK GUIDE) w/Device KIT USE TO TEST BLOOD SUGAR TWICE DAILY  . [DISCONTINUED] dextromethorphan-guaiFENesin (MUCINEX DM) 30-600 MG 12hr tablet Take 1 tablet by mouth 2 (two) times daily.  . [DISCONTINUED]  insulin glargine (LANTUS SOLOSTAR) 100 UNIT/ML Solostar Pen Inject 10 Units into the skin daily.  . [DISCONTINUED] predniSONE (DELTASONE) 10 MG tablet Take 4 tabs po daily x 2 days; then 3 tabs for 2 days; then 2 tabs for 2 days; then 1 tab for 2 days  . [DISCONTINUED] azithromycin (ZITHROMAX) 250 MG tablet Take 1 tablet (250 mg total) by mouth daily. Take one tablet every Monday, Wednesday, Friday   No facility-administered medications prior to visit.    Review of Systems  Constitutional: Negative for appetite change, chills and fatigue.  Eyes: Negative for visual disturbance.  Respiratory: Negative for cough, chest tightness and shortness of breath.   Cardiovascular: Negative for chest pain and palpitations.  Gastrointestinal: Negative for abdominal pain and diarrhea.  Endocrine: Negative for polyphagia and polyuria.        Objective    BP 124/75 (BP Location: Right Arm, Patient Position: Sitting, Cuff Size: Large)   Pulse 83   Ht $R'4\' 11"'tI$  (1.499 m)   Wt 168 lb (76.2 kg)   BMI 33.93 kg/m    Physical Exam Vitals reviewed.  Constitutional:      General: She is not in acute distress.    Appearance: Normal appearance. She is well-developed. She is not diaphoretic.  HENT:     Head: Normocephalic and atraumatic.  Eyes:     General: No scleral icterus.    Conjunctiva/sclera: Conjunctivae normal.  Neck:     Thyroid: No thyromegaly.  Cardiovascular:     Rate and Rhythm: Normal rate and regular rhythm.     Pulses: Normal pulses.     Heart sounds: Normal heart sounds. No murmur heard.   Pulmonary:     Effort: Pulmonary effort is normal. No respiratory distress.     Breath sounds: Normal breath sounds. No wheezing, rhonchi or rales.  Musculoskeletal:     Cervical back: Neck supple.     Right lower leg: No edema.     Left lower leg: No edema.  Lymphadenopathy:     Cervical: No cervical adenopathy.  Skin:    General: Skin is warm and dry.     Findings: No rash.   Neurological:     Mental Status: She is alert and oriented to person, place, and time. Mental status is at baseline.  Psychiatric:        Mood and Affect: Mood normal.        Behavior: Behavior normal.       Results for orders placed or performed in visit on 08/02/20  Renal Function Panel  Result Value Ref Range   Glucose 306 (H) 65 - 99 mg/dL   BUN 23 8 - 27 mg/dL   Creatinine, Ser 1.32 (H) 0.57 - 1.00 mg/dL   GFR calc non Af Amer 39 (L) >59 mL/min/1.73   GFR calc Af Amer 45 (L) >59 mL/min/1.73   BUN/Creatinine Ratio 17 12 - 28   Sodium 131 (L)  134 - 144 mmol/L   Potassium 5.8 (H) 3.5 - 5.2 mmol/L   Chloride 97 96 - 106 mmol/L   CO2 17 (L) 20 - 29 mmol/L   Calcium 8.9 8.7 - 10.3 mg/dL   Phosphorus 4.9 (H) 3.0 - 4.3 mg/dL   Albumin 4.2 3.7 - 4.7 g/dL  POCT glycosylated hemoglobin (Hb A1C)  Result Value Ref Range   Hemoglobin A1C 10.4 (A) 4.0 - 5.6 %   Est. average glucose Bld gHb Est-mCnc 252     Assessment & Plan     Problem List Items Addressed This Visit      Endocrine   Diabetes ([place]) - Primary    Remains uncontrolled with A1c 10.4 and hyperglycemia Prednisone use is not helping Continue current oral meds and increase Lantus from 5 to 10 units daily If fasting blood sugars still >150 in 1 week, will increase to 15 units daily Goal A1c <8 UTD on eye and foot exams On ARB and statin F/u in 3 months      Relevant Medications   insulin glargine (LANTUS SOLOSTAR) 100 UNIT/ML Solostar Pen   Other Relevant Orders   POCT glycosylated hemoglobin (Hb A1C) (Completed)    Other Visit Diagnoses    Hyperkalemia       Relevant Orders   Renal Function Panel (Completed)       Return in about 3 months (around 10/30/2020) for chronic disease f/u.      Total time spent on today's visit was greater than 30 minutes, including both face-to-face time and nonface-to-face time personally spent on review of chart (labs and imaging), discussing labs and goals, discussing  further work-up, treatment options, referrals to specialist if needed, reviewing outside records of pertinent, answering patient's questions, and coordinating care.   I, Lavon Paganini, MD, have reviewed all documentation for this visit. The documentation on 08/03/20 for the exam, diagnosis, procedures, and orders are all accurate and complete.   Hanz Winterhalter, Dionne Bucy, MD, MPH [facility]

## 2020-08-03 ENCOUNTER — Telehealth: Payer: Self-pay

## 2020-08-03 DIAGNOSIS — E875 Hyperkalemia: Secondary | ICD-10-CM

## 2020-08-03 DIAGNOSIS — E1165 Type 2 diabetes mellitus with hyperglycemia: Secondary | ICD-10-CM

## 2020-08-03 LAB — RENAL FUNCTION PANEL
Albumin: 4.2 g/dL (ref 3.7–4.7)
BUN/Creatinine Ratio: 17 (ref 12–28)
BUN: 23 mg/dL (ref 8–27)
CO2: 17 mmol/L — ABNORMAL LOW (ref 20–29)
Calcium: 8.9 mg/dL (ref 8.7–10.3)
Chloride: 97 mmol/L (ref 96–106)
Creatinine, Ser: 1.32 mg/dL — ABNORMAL HIGH (ref 0.57–1.00)
GFR calc Af Amer: 45 mL/min/{1.73_m2} — ABNORMAL LOW (ref >59)
GFR calc non Af Amer: 39 mL/min/{1.73_m2} — ABNORMAL LOW (ref >59)
Glucose: 306 mg/dL — ABNORMAL HIGH (ref 65–99)
Phosphorus: 4.9 mg/dL — ABNORMAL HIGH (ref 3.0–4.3)
Potassium: 5.8 mmol/L — ABNORMAL HIGH (ref 3.5–5.2)
Sodium: 131 mmol/L — ABNORMAL LOW (ref 134–144)

## 2020-08-03 NOTE — Assessment & Plan Note (Signed)
Remains uncontrolled with A1c 10.4 and hyperglycemia Prednisone use is not helping Continue current oral meds and increase Lantus from 5 to 10 units daily If fasting blood sugars still >150 in 1 week, will increase to 15 units daily Goal A1c <8 UTD on eye and foot exams On ARB and statin F/u in 3 months

## 2020-08-03 NOTE — Telephone Encounter (Signed)
-----   Message from [name], MD sent at 08/03/2020  2:15 PM EST ----- Potassium is improving, but still slightly high. Has patient seen nephrology?  Would recommend referral is she has not yet.

## 2020-08-10 ENCOUNTER — Other Ambulatory Visit: Payer: Self-pay | Admitting: Internal Medicine

## 2020-08-10 DIAGNOSIS — R0602 Shortness of breath: Secondary | ICD-10-CM

## 2020-08-22 ENCOUNTER — Telehealth: Payer: Self-pay | Admitting: Family Medicine

## 2020-08-22 NOTE — Telephone Encounter (Signed)
Copied from [place] [phone]. Topic: Quick Communication - Rx Refill/Question >> Aug 22, 2020  5:09 PM Mcneil, Ja-Kwan wrote: Medication: predniSONE (DELTASONE) 10 MG tablet  Has the patient contacted their pharmacy? yes - Pt daughter stated the pharmacy told her to call pcp because there are no refills   Preferred Pharmacy (with phone number or street name): [facility], [place]  Phone: [phone]  Fax: [phone]  Agent: Please be advised that RX refills may take up to 3 business days. We ask that you follow-up with your pharmacy.

## 2020-08-22 NOTE — Telephone Encounter (Signed)
Patient's daughter requesting refill for prednisone 10 mg . Medication not on med list and was discontinued 08/02/20.  Contacted patient and daughter via interpreter [phone] to review requested medication and symptoms patient may be having at this time. Patient's daughter reports when patient is taking erythromycin she gets a cough and wheezes at times. PCP will usually prescribe for 1 week course of prednisone. Patient's daughter reports she will be going out of town on Wednesday until next Wednesday and if patient starts having cough and wheezing she will not have anyone to take her to appt. Daughter is requesting medication if needed while she is out of town. Advised daughter patient may need OV for symptoms if PCP requires. Please advise. Daughter verbalized understanding.

## 2020-08-23 NOTE — Telephone Encounter (Signed)
Please advise 

## 2020-08-23 NOTE — Telephone Encounter (Signed)
She sees [facility] for this and they are the ones that occasionally Rx Prednisone. Would f/u with/call their office. Thanks!

## 2020-08-25 NOTE — Telephone Encounter (Signed)
LMTCB. Pec, please advise as below.

## 2020-08-26 NOTE — Telephone Encounter (Signed)
Manisha advised as below.

## 2020-09-02 DIAGNOSIS — R3981 Functional urinary incontinence: Secondary | ICD-10-CM | POA: Diagnosis not present

## 2020-09-02 DIAGNOSIS — J45909 Unspecified asthma, uncomplicated: Secondary | ICD-10-CM | POA: Diagnosis not present

## 2020-09-03 ENCOUNTER — Other Ambulatory Visit: Payer: Self-pay | Admitting: Adult Health

## 2020-09-03 ENCOUNTER — Other Ambulatory Visit: Payer: Self-pay | Admitting: Internal Medicine

## 2020-09-03 DIAGNOSIS — J4550 Severe persistent asthma, uncomplicated: Secondary | ICD-10-CM

## 2020-09-12 ENCOUNTER — Other Ambulatory Visit: Payer: Self-pay | Admitting: Pulmonary Disease

## 2020-09-12 DIAGNOSIS — J4550 Severe persistent asthma, uncomplicated: Secondary | ICD-10-CM

## 2020-09-15 ENCOUNTER — Other Ambulatory Visit: Payer: Self-pay | Admitting: Nephrology

## 2020-09-15 DIAGNOSIS — R8290 Unspecified abnormal findings in urine: Secondary | ICD-10-CM | POA: Diagnosis not present

## 2020-09-15 DIAGNOSIS — N1832 Chronic kidney disease, stage 3b: Secondary | ICD-10-CM

## 2020-09-15 DIAGNOSIS — I10 Essential (primary) hypertension: Secondary | ICD-10-CM | POA: Diagnosis not present

## 2020-09-15 DIAGNOSIS — E782 Mixed hyperlipidemia: Secondary | ICD-10-CM | POA: Diagnosis not present

## 2020-09-15 DIAGNOSIS — R609 Edema, unspecified: Secondary | ICD-10-CM | POA: Diagnosis not present

## 2020-09-15 DIAGNOSIS — E875 Hyperkalemia: Secondary | ICD-10-CM | POA: Diagnosis not present

## 2020-09-15 DIAGNOSIS — E1122 Type 2 diabetes mellitus with diabetic chronic kidney disease: Secondary | ICD-10-CM | POA: Diagnosis not present

## 2020-09-15 DIAGNOSIS — E1169 Type 2 diabetes mellitus with other specified complication: Secondary | ICD-10-CM | POA: Diagnosis not present

## 2020-09-16 ENCOUNTER — Telehealth: Payer: Self-pay

## 2020-09-16 DIAGNOSIS — J4550 Severe persistent asthma, uncomplicated: Secondary | ICD-10-CM

## 2020-09-16 MED ORDER — ALBUTEROL SULFATE (2.5 MG/3ML) 0.083% IN NEBU: 2.5000 mg | INHALATION_SOLUTION | Freq: Four times a day (QID) | RESPIRATORY_TRACT | 0 refills | Status: DC | PRN

## 2020-09-16 NOTE — Telephone Encounter (Signed)
Received 90 day request of albuterol neb solution from [facility] rx has been sent to preferred pharmacy.

## 2020-09-19 MED ORDER — BUDESONIDE-FORMOTEROL FUMARATE 160-4.5 MCG/ACT IN AERO: 2.0000 | INHALATION_SPRAY | Freq: Two times a day (BID) | RESPIRATORY_TRACT | 3 refills | Status: DC

## 2020-09-19 NOTE — Telephone Encounter (Signed)
Spoke to patient's daughter, Manisha(DPR).  Manisha stated that [facility] received Rx albuterol Rx, however medication will not be in stock until 09/20/2020. She also requested a refill on symbicort. Rx has been sent to preferred pharmacy. Nothing further needed at this time.

## 2020-09-23 DIAGNOSIS — R3981 Functional urinary incontinence: Secondary | ICD-10-CM | POA: Diagnosis not present

## 2020-09-23 DIAGNOSIS — J45909 Unspecified asthma, uncomplicated: Secondary | ICD-10-CM | POA: Diagnosis not present

## 2020-09-24 ENCOUNTER — Other Ambulatory Visit: Payer: Self-pay

## 2020-09-24 ENCOUNTER — Emergency Department: Payer: Medicaid Other

## 2020-09-24 ENCOUNTER — Inpatient Hospital Stay
Admission: EM | Admit: 2020-09-24 | Discharge: 2020-09-27 | DRG: 192 | Disposition: A | Discharge status: Home / Self Care | Payer: Medicaid Other | Attending: Internal Medicine | Admitting: Internal Medicine

## 2020-09-24 DIAGNOSIS — N2589 Other disorders resulting from impaired renal tubular function: Secondary | ICD-10-CM | POA: Diagnosis not present

## 2020-09-24 DIAGNOSIS — R0602 Shortness of breath: Secondary | ICD-10-CM | POA: Diagnosis not present

## 2020-09-24 DIAGNOSIS — E875 Hyperkalemia: Secondary | ICD-10-CM | POA: Diagnosis not present

## 2020-09-24 DIAGNOSIS — Z825 Family history of asthma and other chronic lower respiratory diseases: Secondary | ICD-10-CM | POA: Diagnosis not present

## 2020-09-24 DIAGNOSIS — R062 Wheezing: Secondary | ICD-10-CM | POA: Diagnosis not present

## 2020-09-24 DIAGNOSIS — Z7951 Long term (current) use of inhaled steroids: Secondary | ICD-10-CM

## 2020-09-24 DIAGNOSIS — Z79899 Other long term (current) drug therapy: Secondary | ICD-10-CM | POA: Diagnosis not present

## 2020-09-24 DIAGNOSIS — T380X5A Adverse effect of glucocorticoids and synthetic analogues, initial encounter: Secondary | ICD-10-CM | POA: Diagnosis present

## 2020-09-24 DIAGNOSIS — Z6830 Body mass index (BMI) 30.0-30.9, adult: Secondary | ICD-10-CM | POA: Diagnosis not present

## 2020-09-24 DIAGNOSIS — Z20822 Contact with and (suspected) exposure to covid-19: Secondary | ICD-10-CM | POA: Diagnosis not present

## 2020-09-24 DIAGNOSIS — I10 Essential (primary) hypertension: Secondary | ICD-10-CM | POA: Diagnosis present

## 2020-09-24 DIAGNOSIS — Z8249 Family history of ischemic heart disease and other diseases of the circulatory system: Secondary | ICD-10-CM | POA: Diagnosis not present

## 2020-09-24 DIAGNOSIS — Z7982 Long term (current) use of aspirin: Secondary | ICD-10-CM

## 2020-09-24 DIAGNOSIS — K219 Gastro-esophageal reflux disease without esophagitis: Secondary | ICD-10-CM | POA: Diagnosis not present

## 2020-09-24 DIAGNOSIS — Z794 Long term (current) use of insulin: Secondary | ICD-10-CM

## 2020-09-24 DIAGNOSIS — J441 Chronic obstructive pulmonary disease with (acute) exacerbation: Secondary | ICD-10-CM | POA: Diagnosis not present

## 2020-09-24 DIAGNOSIS — E669 Obesity, unspecified: Secondary | ICD-10-CM | POA: Diagnosis not present

## 2020-09-24 DIAGNOSIS — Z7984 Long term (current) use of oral hypoglycemic drugs: Secondary | ICD-10-CM

## 2020-09-24 DIAGNOSIS — R0609 Other forms of dyspnea: Secondary | ICD-10-CM | POA: Diagnosis present

## 2020-09-24 DIAGNOSIS — R0600 Dyspnea, unspecified: Secondary | ICD-10-CM | POA: Diagnosis present

## 2020-09-24 DIAGNOSIS — E785 Hyperlipidemia, unspecified: Secondary | ICD-10-CM | POA: Diagnosis not present

## 2020-09-24 DIAGNOSIS — E1165 Type 2 diabetes mellitus with hyperglycemia: Secondary | ICD-10-CM | POA: Diagnosis present

## 2020-09-24 DIAGNOSIS — I152 Hypertension secondary to endocrine disorders: Secondary | ICD-10-CM | POA: Diagnosis present

## 2020-09-24 LAB — COMPREHENSIVE METABOLIC PANEL
ALT: 16 U/L (ref 0–44)
AST: 17 U/L (ref 15–41)
Albumin: 3.4 g/dL — ABNORMAL LOW (ref 3.5–5.0)
Alkaline Phosphatase: 61 U/L (ref 38–126)
Anion gap: 8 (ref 5–15)
BUN: 18 mg/dL (ref 8–23)
CO2: 20 mmol/L — ABNORMAL LOW (ref 22–32)
Calcium: 8.4 mg/dL — ABNORMAL LOW (ref 8.9–10.3)
Chloride: 105 mmol/L (ref 98–111)
Creatinine, Ser: 1.03 mg/dL — ABNORMAL HIGH (ref 0.44–1.00)
GFR, Estimated: 56 mL/min — ABNORMAL LOW (ref >60)
Glucose, Bld: 238 mg/dL — ABNORMAL HIGH (ref 70–99)
Potassium: 5.3 mmol/L — ABNORMAL HIGH (ref 3.5–5.1)
Sodium: 133 mmol/L — ABNORMAL LOW (ref 135–145)
Total Bilirubin: 0.8 mg/dL (ref 0.3–1.2)
Total Protein: 6.5 g/dL (ref 6.5–8.1)

## 2020-09-24 LAB — RESP PANEL BY RT-PCR (FLU A&B, COVID) ARPGX2
Influenza A by PCR: NEGATIVE
Influenza B by PCR: NEGATIVE
SARS Coronavirus 2 by RT PCR: NEGATIVE

## 2020-09-24 LAB — GLUCOSE, CAPILLARY
Glucose-Capillary: 285 mg/dL — ABNORMAL HIGH (ref 70–99)
Glucose-Capillary: 348 mg/dL — ABNORMAL HIGH (ref 70–99)
Glucose-Capillary: 358 mg/dL — ABNORMAL HIGH (ref 70–99)
Glucose-Capillary: 373 mg/dL — ABNORMAL HIGH (ref 70–99)
Glucose-Capillary: 402 mg/dL — ABNORMAL HIGH (ref 70–99)

## 2020-09-24 LAB — CBC
HCT: 38.3 % (ref 36.0–46.0)
Hemoglobin: 12 g/dL (ref 12.0–15.0)
MCH: 24.4 pg — ABNORMAL LOW (ref 26.0–34.0)
MCHC: 31.3 g/dL (ref 30.0–36.0)
MCV: 77.8 fL — ABNORMAL LOW (ref 80.0–100.0)
Platelets: 209 10*3/uL (ref 150–400)
RBC: 4.92 MIL/uL (ref 3.87–5.11)
RDW: 14.7 % (ref 11.5–15.5)
WBC: 9.3 10*3/uL (ref 4.0–10.5)
nRBC: 0 % (ref 0.0–0.2)

## 2020-09-24 LAB — HIV ANTIBODY (ROUTINE TESTING W REFLEX): HIV Screen 4th Generation wRfx: NONREACTIVE

## 2020-09-24 LAB — BRAIN NATRIURETIC PEPTIDE: B Natriuretic Peptide: 171.1 pg/mL — ABNORMAL HIGH (ref 0.0–100.0)

## 2020-09-24 LAB — TROPONIN I (HIGH SENSITIVITY)
Troponin I (High Sensitivity): 6 ng/L (ref <18)
Troponin I (High Sensitivity): 5 ng/L (ref <18)

## 2020-09-24 MED ORDER — INSULIN ASPART 100 UNIT/ML ~~LOC~~ SOLN
0.0000 [IU] | Freq: Three times a day (TID) | SUBCUTANEOUS | Status: DC
  Administered 2020-09-24: 11 [IU] via SUBCUTANEOUS
  Administered 2020-09-24: 8 [IU] via SUBCUTANEOUS
  Administered 2020-09-24: 11 [IU] via SUBCUTANEOUS
  Filled 2020-09-24 (×3): qty 1

## 2020-09-24 MED ORDER — LINAGLIPTIN 5 MG PO TABS
5.0000 mg | ORAL_TABLET | Freq: Every day | ORAL | Status: DC
  Administered 2020-09-24 – 2020-09-27 (×4): 5 mg via ORAL
  Filled 2020-09-24 (×4): qty 1

## 2020-09-24 MED ORDER — IPRATROPIUM-ALBUTEROL 0.5-2.5 (3) MG/3ML IN SOLN
RESPIRATORY_TRACT | Status: AC
  Administered 2020-09-24: 3 mL via RESPIRATORY_TRACT
  Filled 2020-09-24: qty 3

## 2020-09-24 MED ORDER — GUAIFENESIN ER 600 MG PO TB12
600.0000 mg | ORAL_TABLET | Freq: Two times a day (BID) | ORAL | Status: DC
  Administered 2020-09-24 – 2020-09-27 (×7): 600 mg via ORAL
  Filled 2020-09-24 (×6): qty 1

## 2020-09-24 MED ORDER — MONTELUKAST SODIUM 10 MG PO TABS
10.0000 mg | ORAL_TABLET | Freq: Every day | ORAL | Status: DC
  Administered 2020-09-24 – 2020-09-26 (×3): 10 mg via ORAL
  Filled 2020-09-24 (×3): qty 1

## 2020-09-24 MED ORDER — ENOXAPARIN SODIUM 40 MG/0.4ML ~~LOC~~ SOLN
40.0000 mg | SUBCUTANEOUS | Status: DC
  Administered 2020-09-24 – 2020-09-27 (×4): 40 mg via SUBCUTANEOUS
  Filled 2020-09-24 (×4): qty 0.4

## 2020-09-24 MED ORDER — PANTOPRAZOLE SODIUM 40 MG PO TBEC
40.0000 mg | DELAYED_RELEASE_TABLET | Freq: Every day | ORAL | Status: DC
  Administered 2020-09-24 – 2020-09-27 (×4): 40 mg via ORAL
  Filled 2020-09-24 (×4): qty 1

## 2020-09-24 MED ORDER — MOMETASONE FURO-FORMOTEROL FUM 200-5 MCG/ACT IN AERO
2.0000 | INHALATION_SPRAY | Freq: Two times a day (BID) | RESPIRATORY_TRACT | Status: DC
  Administered 2020-09-24 – 2020-09-27 (×7): 2 via RESPIRATORY_TRACT
  Filled 2020-09-24: qty 8.8

## 2020-09-24 MED ORDER — METHYLPREDNISOLONE SODIUM SUCC 125 MG IJ SOLR
125.0000 mg | Freq: Once | INTRAMUSCULAR | Status: AC
  Administered 2020-09-24: 125 mg via INTRAVENOUS
  Filled 2020-09-24: qty 2

## 2020-09-24 MED ORDER — AMLODIPINE BESYLATE 5 MG PO TABS
5.0000 mg | ORAL_TABLET | Freq: Every day | ORAL | Status: DC
  Administered 2020-09-24 – 2020-09-27 (×4): 5 mg via ORAL
  Filled 2020-09-24 (×4): qty 1

## 2020-09-24 MED ORDER — CARVEDILOL 12.5 MG PO TABS
12.5000 mg | ORAL_TABLET | Freq: Two times a day (BID) | ORAL | Status: DC
  Administered 2020-09-24 – 2020-09-27 (×7): 12.5 mg via ORAL
  Filled 2020-09-24 (×6): qty 1

## 2020-09-24 MED ORDER — ROSUVASTATIN CALCIUM 5 MG PO TABS
5.0000 mg | ORAL_TABLET | Freq: Every day | ORAL | Status: DC
  Filled 2020-09-24 (×2): qty 1

## 2020-09-24 MED ORDER — IPRATROPIUM-ALBUTEROL 0.5-2.5 (3) MG/3ML IN SOLN: 3.0000 mL | Freq: Once | RESPIRATORY_TRACT | Status: AC

## 2020-09-24 MED ORDER — INSULIN GLARGINE 100 UNIT/ML ~~LOC~~ SOLN
10.0000 [IU] | Freq: Every day | SUBCUTANEOUS | Status: DC
  Administered 2020-09-24 – 2020-09-25 (×2): 10 [IU] via SUBCUTANEOUS
  Filled 2020-09-24 (×2): qty 0.1

## 2020-09-24 MED ORDER — LOSARTAN POTASSIUM 50 MG PO TABS: 50.0000 mg | ORAL_TABLET | Freq: Every day | ORAL | Status: DC

## 2020-09-24 MED ORDER — ALBUTEROL SULFATE (2.5 MG/3ML) 0.083% IN NEBU
2.5000 mg | INHALATION_SOLUTION | Freq: Four times a day (QID) | RESPIRATORY_TRACT | Status: DC | PRN
  Administered 2020-09-25: 2.5 mg via RESPIRATORY_TRACT
  Filled 2020-09-24: qty 3

## 2020-09-24 MED ORDER — SODIUM ZIRCONIUM CYCLOSILICATE 10 G PO PACK
10.0000 g | PACK | Freq: Every day | ORAL | Status: DC
  Administered 2020-09-24: 10 g via ORAL
  Filled 2020-09-24 (×3): qty 1

## 2020-09-24 MED ORDER — PREDNISONE 20 MG PO TABS
40.0000 mg | ORAL_TABLET | Freq: Every day | ORAL | Status: DC
  Administered 2020-09-24 – 2020-09-26 (×3): 40 mg via ORAL
  Filled 2020-09-24 (×3): qty 2

## 2020-09-24 MED ORDER — SODIUM CHLORIDE 0.9% FLUSH: 3.0000 mL | INTRAVENOUS | Status: DC | PRN

## 2020-09-24 MED ORDER — SODIUM CHLORIDE 0.9 % IV SOLN: 250.0000 mL | INTRAVENOUS | Status: DC | PRN

## 2020-09-24 MED ORDER — FLUTICASONE PROPIONATE 50 MCG/ACT NA SUSP
1.0000 | Freq: Every day | NASAL | Status: DC
  Filled 2020-09-24: qty 16

## 2020-09-24 MED ORDER — IPRATROPIUM-ALBUTEROL 0.5-2.5 (3) MG/3ML IN SOLN
3.0000 mL | Freq: Once | RESPIRATORY_TRACT | Status: AC
  Administered 2020-09-24: 3 mL via RESPIRATORY_TRACT
  Filled 2020-09-24: qty 3

## 2020-09-24 MED ORDER — ASPIRIN EC 81 MG PO TBEC
81.0000 mg | DELAYED_RELEASE_TABLET | Freq: Every day | ORAL | Status: DC
  Administered 2020-09-24 – 2020-09-27 (×4): 81 mg via ORAL
  Filled 2020-09-24 (×4): qty 1

## 2020-09-24 MED ORDER — SODIUM CHLORIDE 0.9% FLUSH
3.0000 mL | Freq: Two times a day (BID) | INTRAVENOUS | Status: DC
  Administered 2020-09-24 – 2020-09-27 (×7): 3 mL via INTRAVENOUS

## 2020-09-24 MED ORDER — INSULIN ASPART 100 UNIT/ML ~~LOC~~ SOLN
0.0000 [IU] | SUBCUTANEOUS | Status: AC
  Administered 2020-09-24 – 2020-09-25 (×2): 20 [IU] via SUBCUTANEOUS
  Filled 2020-09-24 (×2): qty 1

## 2020-09-24 NOTE — Progress Notes (Signed)
Pt admitted to room 146. Daughter at bedside and states she is comfortable translating for admission process. Pt provided menu and meal order placed. VSS. Pt oriented to room and equipment. All questions and concerns addressed at this time.   09/24/20 0902  Vitals  Temp 97.7 F (36.5 C)  BP (!) 160/82  MAP (mmHg) 106  BP Location Left Arm  BP Method Automatic  Patient Position (if appropriate) Lying  Pulse Rate 94  Pulse Rate Source Monitor  Resp 20  MEWS COLOR  MEWS Score Color Green  Oxygen Therapy  SpO2 99 %  O2 Device Room Air

## 2020-09-24 NOTE — H&P (Addendum)
History and Physical    Whitney Fleming PYK:[id number] DOB: June 30, 1945 DOA: 09/24/2020  PCP: [name], MD   Patient coming from: Home  I have personally briefly reviewed patient's old medical records in [place]  Chief Complaint: Shortness of breath  HPI: Whitney Fleming is a 75 y.o. female with medical history significant for asthma/COPD, diabetes mellitus, obesity, hypertension, dyslipidemia who presents to the emergency room accompanied by her daughter for evaluation of worsening shortness of breath associated with a dry cough and wheezing.  Patient had used her nebulizers at home without any improvement in her symptoms. She denies having any fever, no chills, no orthopnea, no paroxysmal nocturnal dyspnea, no extremity swelling, no chest pain, no headache, no lightheadedness, no nausea, no vomiting, no diaphoresis or palpitations, no urinary symptoms or any changes in her bowel habits, no focal deficits or blurred vision. Venous blood gas 7.27/45/66/20.7/89.5 Sodium 133, potassium 5.3, chloride 105, bicarb 20, glucose 238, BUN 18, creatinine 1.03, calcium 8.4, alkaline phosphatase 61, albumin 3.4, AST 17, ALT 16, total protein 6.5, BNP 171, troponin 6, white count 9.3, hemoglobin 12.0, hematocrit 38.3, MCV 77.8, RDW 14.7, platelet count 209 Respiratory viral panel is negative Chest x-ray reviewed by me shows chronic lung disease with mildly increased streaky opacity at both lung bases from last year. Consider acute infectious exacerbation versus progression of basilar scarring/fibrosis. Twelve-lead EKG reviewed by me shows sinus rhythm with low voltage QRS.    ED Course: Patient is a 75 year old female who presents to the emergency room for evaluation of worsening shortness of breath from her baseline associated with a nonproductive cough and wheezing.  Symptoms persisted despite use of her home nebulizer.  Patient received multiple nebulizer treatments in the ER as well as IV  steroids but remains symptomatic.  She will be admitted to the hospital for further evaluation.   Review of Systems: As per HPI otherwise all other systems reviewed and negative.    Past Medical History:  Diagnosis Date  . Anemia    normocytic anemia  . Asthma   . Cardiomegaly   . COPD (chronic obstructive pulmonary disease) ([place])   . Diabetes mellitus without complication ([place])   . Hypertension   . Hyponatremia     Past Surgical History:  Procedure Laterality Date  . TUBAL LIGATION       reports that she has never smoked. She has never used smokeless tobacco. She reports that she does not drink alcohol and does not use drugs.  No Known Allergies  Family History  Problem Relation Age of Onset  . Tuberculosis Other   . Healthy Mother   . Heart disease Father   . Asthma Father   . Cancer Neg Hx       Prior to Admission medications   Medication Sig Start Date End Date Taking? Authorizing Provider  Accu-Chek FastClix Lancets MISC Use as directed to check blood glucose twice daily 06/27/20   Brita Romp, Dionne Bucy, MD  albuterol (PROVENTIL) (2.5 MG/3ML) 0.083% nebulizer solution Take 3 mLs (2.5 mg total) by nebulization every 6 (six) hours as needed for wheezing or shortness of breath. 09/16/20   Tyler Pita, MD  AMBULATORY NON FORMULARY MEDICATION Medication Name: Aero chamber use as directed. NL:Z76.734 12/24/17   [name], MD  aspirin EC 81 MG tablet Take 1 tablet (81 mg total) by mouth daily. Swallow whole. 01/08/20   Minna Merritts, MD  azithromycin (ZITHROMAX) 250 MG tablet TAKE ONE TABLET BY MOUTH FIVE DAYS  PER WEEK 09/05/20   Flora Lipps, MD  budesonide-formoterol (SYMBICORT) 160-4.5 MCG/ACT inhaler Inhale 2 puffs into the lungs 2 (two) times daily. Rinse mouth after use 09/19/20   Flora Lipps, MD  carvedilol (COREG) 12.5 MG tablet Take 1 tablet (12.5 mg total) by mouth 2 (two) times daily with a meal. 06/27/20   Bacigalupo, Dionne Bucy, MD  Continuous  Blood Gluc Receiver (FREESTYLE LIBRE 14 DAY READER) DEVI Use to test blood glucose throughout the day, continuously 06/27/20   Flinchum, Kelby Aline, FNP  Continuous Blood Gluc Sensor (FREESTYLE LIBRE 14 DAY SENSOR) MISC Use for 14 days to monitor glucose continuously 06/27/20   Flinchum, Kelby Aline, FNP  empagliflozin (JARDIANCE) 25 MG TABS tablet Take 1 tablet (25 mg total) by mouth daily. 06/27/20   [name], MD  fluticasone (FLONASE) 50 MCG/ACT nasal spray Place 1 spray into both nostrils daily. 07/13/20   Martyn Ehrich, NP  glucose blood (ACCU-CHEK GUIDE) test strip USE AS DIRECTED TWICE DAILY 06/27/20   Bacigalupo, Dionne Bucy, MD  guaiFENesin (MUCINEX) 600 MG 12 hr tablet Take 1 tablet (600 mg total) by mouth 2 (two) times daily. 10/29/19 10/28/20  Harvest Dark, MD  insulin glargine (LANTUS SOLOSTAR) 100 UNIT/ML Solostar Pen Inject 10 Units into the skin daily. 08/02/20   [name], MD  Insulin Pen Needle (NOVOFINE) 32G X 6 MM MISC Use as directed to inject insulin daily 11/18/19   [name], MD  losartan (COZAAR) 50 MG tablet Take 1 tablet (50 mg total) by mouth daily. 06/27/20   [name], MD  metFORMIN (GLUCOPHAGE) 1000 MG tablet Take 1 tablet (1,000 mg total) by mouth 2 (two) times daily with a meal. 06/27/20   Bacigalupo, Dionne Bucy, MD  montelukast (SINGULAIR) 10 MG tablet Take 1 tablet (10 mg total) by mouth at bedtime. 06/27/20   [name], MD  omeprazole (PRILOSEC) 20 MG capsule Take 1 capsule (20 mg total) by mouth 2 (two) times daily before a meal. 07/13/20   Martyn Ehrich, NP  rosuvastatin (CRESTOR) 5 MG tablet Take 1 tablet (5 mg total) by mouth daily. 06/27/20   [name], MD  sitaGLIPtin (JANUVIA) 100 MG tablet Take 1 tablet (100 mg total) by mouth daily. 06/27/20   [name], MD    Physical Exam: Vitals:   09/24/20 0530 09/24/20 0630 09/24/20 0800 09/24/20 0902  BP: (!) 152/58 (!) 165/55 (!) 149/54 (!)  160/82  Pulse: 72 80 94 94  Resp: 19 20 20 20   Temp:    97.7 F (36.5 C)  TempSrc:      SpO2: 98% 100% 99% 99%  Weight:      Height:         Vitals:   09/24/20 0530 09/24/20 0630 09/24/20 0800 09/24/20 0902  BP: (!) 152/58 (!) 165/55 (!) 149/54 (!) 160/82  Pulse: 72 80 94 94  Resp: 19 20 20 20   Temp:    97.7 F (36.5 C)  TempSrc:      SpO2: 98% 100% 99% 99%  Weight:      Height:          Constitutional: Alert and oriented x 3 . Not in any apparent distress.  Conversational dyspnea HEENT:      Head: Normocephalic and atraumatic.         Eyes: PERLA, EOMI, Conjunctivae are normal. Sclera is non-icteric.       Mouth/Throat: Mucous membranes are moist.  Neck: Supple with no signs of meningismus. Cardiovascular: Regular rate and rhythm. No murmurs, gallops, or rubs. 2+ symmetrical distal pulses are present . No JVD. No LE edema Respiratory:  Conversational dyspnea.  Scattered wheezes in all lung fields. Gastrointestinal: Soft, non tender, and non distended with positive bowel sounds.  Central adiposity Genitourinary: No CVA tenderness. Musculoskeletal: Nontender with normal range of motion in all extremities. No cyanosis, or erythema of extremities. Neurologic:  Face is symmetric. Moving all extremities. No gross focal neurologic deficits  Skin: Skin is warm, dry.  No rash or ulcers Psychiatric: Mood and affect are normal   Labs on Admission: I have personally reviewed following labs and imaging studies  CBC: Recent Labs  Lab 09/24/20 0358  WBC 9.3  HGB 12.0  HCT 38.3  MCV 77.8*  PLT 122   Basic Metabolic Panel: Recent Labs  Lab 09/24/20 0358  NA 133*  K 5.3*  CL 105  CO2 20*  GLUCOSE 238*  BUN 18  CREATININE 1.03*  CALCIUM 8.4*   GFR: Estimated Creatinine Clearance: 42.7 mL/min (A) (by C-G formula based on SCr of 1.03 mg/dL (H)). Liver Function Tests: Recent Labs  Lab 09/24/20 0358  AST 17  ALT 16  ALKPHOS 61  BILITOT 0.8  PROT 6.5   ALBUMIN 3.4*   No results for input(s): LIPASE, AMYLASE in the last 168 hours. No results for input(s): AMMONIA in the last 168 hours. Coagulation Profile: No results for input(s): INR, PROTIME in the last 168 hours. Cardiac Enzymes: No results for input(s): CKTOTAL, CKMB, CKMBINDEX, TROPONINI in the last 168 hours. BNP (last 3 results) No results for input(s): PROBNP in the last 8760 hours. HbA1C: No results for input(s): HGBA1C in the last 72 hours. CBG: No results for input(s): GLUCAP in the last 168 hours. Lipid Profile: No results for input(s): CHOL, HDL, LDLCALC, TRIG, CHOLHDL, LDLDIRECT in the last 72 hours. Thyroid Function Tests: No results for input(s): TSH, T4TOTAL, FREET4, T3FREE, THYROIDAB in the last 72 hours. Anemia Panel: No results for input(s): VITAMINB12, FOLATE, FERRITIN, TIBC, IRON, RETICCTPCT in the last 72 hours. Urine analysis:    Component Value Date/Time   COLORURINE YELLOW 08/18/2019 0426   APPEARANCEUR CLEAR 08/18/2019 0426   APPEARANCEUR Clear 12/25/2012 0411   LABSPEC 1.010 08/18/2019 0426   LABSPEC 1.006 12/25/2012 0411   PHURINE 5.5 08/18/2019 0426   GLUCOSEU 500 (A) 08/18/2019 0426   GLUCOSEU >=500 12/25/2012 0411   HGBUR NEGATIVE 08/18/2019 0426   BILIRUBINUR NEGATIVE 08/18/2019 0426   BILIRUBINUR Negative 01/14/2018 1211   BILIRUBINUR Negative 12/25/2012 0411   KETONESUR NEGATIVE 08/18/2019 0426   PROTEINUR NEGATIVE 08/18/2019 0426   UROBILINOGEN 0.2 01/14/2018 1211   NITRITE NEGATIVE 08/18/2019 0426   LEUKOCYTESUR NEGATIVE 08/18/2019 0426   LEUKOCYTESUR Negative 12/25/2012 0411    Radiological Exams on Admission: DG Chest Portable 1 View  Result Date: 09/24/2020 CLINICAL DATA:  75 year old female with shortness of breath for 1 week. Audible wheezing. EXAM: PORTABLE CHEST 1 VIEW COMPARISON:  Portable chest 10/29/2019 and earlier. FINDINGS: Portable AP upright view at 0347 hours. Chronically large lung volumes with subpleural and  basilar pulmonary scarring or early fibrosis demonstrated by CT in 2019. Stable cardiac size and mediastinal contours. No cardiomegaly. No pneumothorax, pulmonary edema or pleural effusion. Streaky opacity in both costophrenic angles is increased since last year. Tracheal air column partially obscured by nebulizer artifact. No acute osseous abnormality identified. Paucity of bowel gas in the upper abdomen. IMPRESSION: Chronic lung disease with mildly  increased streaky opacity at both lung bases from last year. Consider acute infectious exacerbation versus progression of basilar scarring/fibrosis. Electronically Signed   By: Genevie Ann M.D.   On: 09/24/2020 04:18     Assessment/Plan Principal Problem:   COPD exacerbation (HCC) Active Problems:   HTN (hypertension)   GERD (gastroesophageal reflux disease)   Dyspnea on exertion   Hyperkalemia      COPD with acute exacerbation Patient with a history of COPD who presents to the ER for evaluation of worsening shortness of breath associated with a dry cough and wheezing Continue as needed and scheduled nebulizer treatments Place patient on systemic steroids Continue inhaled steroids    Hypertension Continue carvedilol Hold Cozaar due to hyperkalemia   Diabetes mellitus Maintain consistent carbohydrate diet Continue Lantus 10 units daily Sliding scale insulin with Accu-Cheks before meals and at bedtime    Mild hyperkalemia Most likely related to ARB use 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  All questions and concerns have been addressed.  She verbalizes  understanding and agrees with the plan.  Initially been Disposition Plan: Back to previous home environment Consults called: none Status: At the time of admission, it appears that the appropriate admission status for this patient is inpatient. This is judged to be reasonable and necessary in order to provide the required intensity of service to ensure the patient's safety given the presenting symptoms, physical exam findings, and initial radiographic and laboratory data in the context of their comorbid conditions. Patient requires inpatient status due to high intensity of service, high risk for further deterioration and high frequency of surveillance required.    Collier Bullock MD [facility]     09/24/2020, 9:02 AM

## 2020-09-24 NOTE — ED Triage Notes (Signed)
Pt presents to ER with daughter.  Per daughter, pt has been feeling SOB since Sat PM.  Pt has taken neb tx at home without relief.  Pt audibly wheezing in triage.  Pt has hx of COPD.

## 2020-09-24 NOTE — ED Provider Notes (Signed)
[facility] Emergency Department Provider Note    Event Date/Time   First MD Initiated Contact with Patient 09/24/20 [phone]     (approximate)  I have reviewed the triage vital signs and the nursing notes.   HISTORY  Chief Complaint Shortness of Breath    HPI Whitney Fleming is a 75 y.o. female who presents to the ER for evaluation of shortness of breath.  She is been having cough and some wheezing over the past several days.  No measured fevers.  Became suddenly worse this evening to the point where she woke up from sleep gasping for air.  She does not wear home oxygen.  No recent steroids no recent antibiotics.    Past Medical History:  Diagnosis Date  . Anemia    normocytic anemia  . Asthma   . Cardiomegaly   . COPD (chronic obstructive pulmonary disease) ([place])   . Diabetes mellitus without complication ([place])   . Hypertension   . Hyponatremia    Family History  Problem Relation Age of Onset  . Tuberculosis Other   . Healthy Mother   . Heart disease Father   . Asthma Father   . Cancer Neg Hx    Past Surgical History:  Procedure Laterality Date  . TUBAL LIGATION     Patient Active Problem List   Diagnosis Date Noted  . COPD exacerbation ([place]) 09/24/2020  . Dyspnea on exertion 11/18/2019  . Asthma exacerbation 08/18/2019  . COPD (chronic obstructive pulmonary disease) ([place]) 03/03/2018  . Hyperlipidemia associated with type 2 diabetes mellitus ([place]) 12/20/2017  . GERD (gastroesophageal reflux disease) 12/20/2017  . Respiratory failure ([place]) 12/02/2017  . Asthma 09/26/2017  . HTN (hypertension) 09/26/2017  . Diabetes ([place]) 09/26/2017      Prior to Admission medications   Medication Sig Start Date End Date Taking? Authorizing Provider  Accu-Chek FastClix Lancets MISC Use as directed to check blood glucose twice daily 06/27/20   Brita Romp, Dionne Bucy, MD  albuterol (PROVENTIL) (2.5 MG/3ML) 0.083% nebulizer solution Take 3 mLs (2.5 mg total)  by nebulization every 6 (six) hours as needed for wheezing or shortness of breath. 09/16/20   Tyler Pita, MD  AMBULATORY NON FORMULARY MEDICATION Medication Name: Aero chamber use as directed. WP:Y09.983 12/24/17   [name], MD  aspirin EC 81 MG tablet Take 1 tablet (81 mg total) by mouth daily. Swallow whole. 01/08/20   Minna Merritts, MD  azithromycin (ZITHROMAX) 250 MG tablet TAKE ONE TABLET BY MOUTH FIVE DAYS PER WEEK 09/05/20   Flora Lipps, MD  budesonide-formoterol (SYMBICORT) 160-4.5 MCG/ACT inhaler Inhale 2 puffs into the lungs 2 (two) times daily. Rinse mouth after use 09/19/20   Flora Lipps, MD  carvedilol (COREG) 12.5 MG tablet Take 1 tablet (12.5 mg total) by mouth 2 (two) times daily with a meal. 06/27/20   Bacigalupo, Dionne Bucy, MD  Continuous Blood Gluc Receiver (FREESTYLE LIBRE 14 DAY READER) DEVI Use to test blood glucose throughout the day, continuously 06/27/20   Flinchum, Kelby Aline, FNP  Continuous Blood Gluc Sensor (FREESTYLE LIBRE 14 DAY SENSOR) MISC Use for 14 days to monitor glucose continuously 06/27/20   Flinchum, Kelby Aline, FNP  empagliflozin (JARDIANCE) 25 MG TABS tablet Take 1 tablet (25 mg total) by mouth daily. 06/27/20   [name], MD  fluticasone (FLONASE) 50 MCG/ACT nasal spray Place 1 spray into both nostrils daily. 07/13/20   Martyn Ehrich, NP  glucose blood (ACCU-CHEK GUIDE) test strip USE AS  DIRECTED TWICE DAILY 06/27/20   [name], MD  guaiFENesin (MUCINEX) 600 MG 12 hr tablet Take 1 tablet (600 mg total) by mouth 2 (two) times daily. 10/29/19 10/28/20  Harvest Dark, MD  insulin glargine (LANTUS SOLOSTAR) 100 UNIT/ML Solostar Pen Inject 10 Units into the skin daily. 08/02/20   [name], MD  Insulin Pen Needle (NOVOFINE) 32G X 6 MM MISC Use as directed to inject insulin daily 11/18/19   [name], MD  losartan (COZAAR) 50 MG tablet Take 1 tablet (50 mg total) by mouth daily. 06/27/20   [name], MD  metFORMIN (GLUCOPHAGE) 1000 MG tablet Take 1 tablet (1,000 mg total) by mouth 2 (two) times daily with a meal. 06/27/20   Bacigalupo, Dionne Bucy, MD  montelukast (SINGULAIR) 10 MG tablet Take 1 tablet (10 mg total) by mouth at bedtime. 06/27/20   [name], MD  omeprazole (PRILOSEC) 20 MG capsule Take 1 capsule (20 mg total) by mouth 2 (two) times daily before a meal. 07/13/20   Martyn Ehrich, NP  rosuvastatin (CRESTOR) 5 MG tablet Take 1 tablet (5 mg total) by mouth daily. 06/27/20   [name], MD  sitaGLIPtin (JANUVIA) 100 MG tablet Take 1 tablet (100 mg total) by mouth daily. 06/27/20   [name], MD    Allergies Patient has no known allergies.    Social History Social History   Tobacco Use  . Smoking status: Never Smoker  . Smokeless tobacco: Never Used  Vaping Use  . Vaping Use: Never used  Substance Use Topics  . Alcohol use: Never  . Drug use: Never    Review of Systems Patient denies headaches, rhinorrhea, blurry vision, numbness, shortness of breath, chest pain, edema, cough, abdominal pain, nausea, vomiting, diarrhea, dysuria, fevers, rashes or hallucinations unless otherwise stated above in HPI. ____________________________________________   PHYSICAL EXAM:  VITAL SIGNS: Vitals:   09/24/20 0530 09/24/20 0630  BP: (!) 152/58 (!) 165/55  Pulse: 72 80  Resp: 19 20  Temp:    SpO2: 98% 100%    Constitutional: Alert and oriented.  Eyes: Conjunctivae are normal.  Head: Atraumatic. Nose: No congestion/rhinnorhea. Mouth/Throat: Mucous membranes are moist.   Neck: No stridor. Painless ROM.  Cardiovascular: Normal rate, regular rhythm. Grossly normal heart sounds.  Good peripheral circulation. Respiratory: mild tachypnea with diffuse expiratory wheeze. Gastrointestinal: Soft and nontender. No distention. No abdominal bruits. No CVA tenderness. Genitourinary:  Musculoskeletal: No lower extremity tenderness nor edema.  No  joint effusions. Neurologic:  Normal speech and language. No gross focal neurologic deficits are appreciated. No facial droop Skin:  Skin is warm, dry and intact. No rash noted. Psychiatric: Mood and affect are normal. Speech and behavior are normal.  ____________________________________________   LABS (all labs ordered are listed, but only abnormal results are displayed)  Results for orders placed or performed during the hospital encounter of 09/24/20 (from the past 24 hour(s))  Blood gas, venous     Status: Abnormal (Preliminary result)   Collection Time: 09/24/20  3:58 AM  Result Value Ref Range   FIO2 PENDING    pH, Ven 7.27 7.250 - 7.430   pCO2, Ven 45 44.0 - 60.0 mmHg   pO2, Ven 66.0 (H) 32.0 - 45.0 mmHg   Bicarbonate 20.7 20.0 - 28.0 mmol/L   Acid-base deficit 6.1 (H) 0.0 - 2.0 mmol/L   O2 Saturation 89.5 %   Patient temperature 37.0    Collection site VEIN  Sample type VENOUS   Brain natriuretic peptide     Status: Abnormal   Collection Time: 09/24/20  3:58 AM  Result Value Ref Range   B Natriuretic Peptide 171.1 (H) 0.0 - 100.0 pg/mL  Troponin I (High Sensitivity)     Status: None   Collection Time: 09/24/20  3:58 AM  Result Value Ref Range   Troponin I (High Sensitivity) 6 <18 ng/L  CBC     Status: Abnormal   Collection Time: 09/24/20  3:58 AM  Result Value Ref Range   WBC 9.3 4.0 - 10.5 K/uL   RBC 4.92 3.87 - 5.11 MIL/uL   Hemoglobin 12.0 12.0 - 15.0 g/dL   HCT 38.3 36.0 - 46.0 %   MCV 77.8 (L) 80.0 - 100.0 fL   MCH 24.4 (L) 26.0 - 34.0 pg   MCHC 31.3 30.0 - 36.0 g/dL   RDW 14.7 11.5 - 15.5 %   Platelets 209 150 - 400 K/uL   nRBC 0.0 0.0 - 0.2 %  Comprehensive metabolic panel     Status: Abnormal   Collection Time: 09/24/20  3:58 AM  Result Value Ref Range   Sodium 133 (L) 135 - 145 mmol/L   Potassium 5.3 (H) 3.5 - 5.1 mmol/L   Chloride 105 98 - 111 mmol/L   CO2 20 (L) 22 - 32 mmol/L   Glucose, Bld 238 (H) 70 - 99 mg/dL   BUN 18 8 - 23 mg/dL    Creatinine, Ser 1.03 (H) 0.44 - 1.00 mg/dL   Calcium 8.4 (L) 8.9 - 10.3 mg/dL   Total Protein 6.5 6.5 - 8.1 g/dL   Albumin 3.4 (L) 3.5 - 5.0 g/dL   AST 17 15 - 41 U/L   ALT 16 0 - 44 U/L   Alkaline Phosphatase 61 38 - 126 U/L   Total Bilirubin 0.8 0.3 - 1.2 mg/dL   GFR, Estimated 56 (L) >60 mL/min   Anion gap 8 5 - 15  Troponin I (High Sensitivity)     Status: None   Collection Time: 09/24/20  6:09 AM  Result Value Ref Range   Troponin I (High Sensitivity) 5 <18 ng/L   ____________________________________________  EKG My review and personal interpretation at Time: 3:48   Indication: sob  Rate: 70  Rhythm: sinus Axis: normal Other: no stemi, nonspecific st abn ____________________________________________  RADIOLOGY  I personally reviewed all radiographic images ordered to evaluate for the above acute complaints and reviewed radiology reports and findings.  These findings were personally discussed with the patient.  Please see medical record for radiology report.  ____________________________________________   PROCEDURES  Procedure(s) performed:  Procedures    Critical Care performed: no ____________________________________________   INITIAL IMPRESSION / ASSESSMENT AND PLAN / ED COURSE  Pertinent labs & imaging results that were available during my care of the patient were reviewed by me and considered in my medical decision making (see chart for details).   DDX: Asthma, copd, CHF, pna, ptx, malignancy, Pe, anemia  Sennie Ruest is a 75 y.o. who presents to the ED with symptoms and presentation as described above.  Patient with findings of diffuse wheezing on exam no hypoxia but does appear significantly dyspneic.  She received multiple nebs as well as IV steroid here in the ER.  Does not seem consistent with PE.  Less consistent with ACS or cardiac etiology.  Based on her persistent dyspnea persistent wheeze do feel she should be admitted to the hospital for further  respiratory management.  Discussed case with hospitalist who currently agrees to admit for further work-up and tx.     The patient was evaluated in Emergency Department today for the symptoms described in the history of present illness. He/she was evaluated in the context of the global COVID-19 pandemic, which necessitated consideration that the patient might be at risk for infection with the SARS-CoV-2 virus that causes COVID-19. Institutional protocols and algorithms that pertain to the evaluation of patients at risk for COVID-19 are in a state of rapid change based on information released by regulatory bodies including the CDC and federal and state organizations. These policies and algorithms were followed during the patient's care in the ED.  As part of my medical decision making, I reviewed the following data within the [place] notes reviewed and incorporated, Labs reviewed, notes from prior ED visits and [place] Controlled Substance Database   ____________________________________________   FINAL CLINICAL IMPRESSION(S) / ED DIAGNOSES  Final diagnoses:  COPD exacerbation ([place])      NEW MEDICATIONS STARTED DURING THIS VISIT:  New Prescriptions   No medications on file     Note:  This document was prepared using Dragon voice recognition software and may include unintentional dictation errors.    Merlyn Lot, MD 09/24/20 [phone]

## 2020-09-24 NOTE — ED Notes (Signed)
Informed RN bed assigned [phone]

## 2020-09-24 NOTE — Plan of Care (Signed)

## 2020-09-24 NOTE — ED Notes (Signed)
Pt is ambulatory to the restroom without assistance at this time.

## 2020-09-25 DIAGNOSIS — J441 Chronic obstructive pulmonary disease with (acute) exacerbation: Secondary | ICD-10-CM | POA: Diagnosis not present

## 2020-09-25 LAB — BASIC METABOLIC PANEL
Anion gap: 11 (ref 5–15)
BUN: 28 mg/dL — ABNORMAL HIGH (ref 8–23)
CO2: 22 mmol/L (ref 22–32)
Calcium: 8.8 mg/dL — ABNORMAL LOW (ref 8.9–10.3)
Chloride: 100 mmol/L (ref 98–111)
Creatinine, Ser: 1.03 mg/dL — ABNORMAL HIGH (ref 0.44–1.00)
GFR, Estimated: 56 mL/min — ABNORMAL LOW (ref >60)
Glucose, Bld: 180 mg/dL — ABNORMAL HIGH (ref 70–99)
Potassium: 4.6 mmol/L (ref 3.5–5.1)
Sodium: 133 mmol/L — ABNORMAL LOW (ref 135–145)

## 2020-09-25 LAB — GLUCOSE, CAPILLARY
Glucose-Capillary: 183 mg/dL — ABNORMAL HIGH (ref 70–99)
Glucose-Capillary: 427 mg/dL — ABNORMAL HIGH (ref 70–99)
Glucose-Capillary: 440 mg/dL — ABNORMAL HIGH (ref 70–99)
Glucose-Capillary: 433 mg/dL — ABNORMAL HIGH (ref 70–99)
Glucose-Capillary: 196 mg/dL — ABNORMAL HIGH (ref 70–99)

## 2020-09-25 LAB — CBC
HCT: 38.8 % (ref 36.0–46.0)
Hemoglobin: 12.5 g/dL (ref 12.0–15.0)
MCH: 24.5 pg — ABNORMAL LOW (ref 26.0–34.0)
MCHC: 32.2 g/dL (ref 30.0–36.0)
MCV: 75.9 fL — ABNORMAL LOW (ref 80.0–100.0)
Platelets: 242 10*3/uL (ref 150–400)
RBC: 5.11 MIL/uL (ref 3.87–5.11)
RDW: 14.4 % (ref 11.5–15.5)
WBC: 11.3 10*3/uL — ABNORMAL HIGH (ref 4.0–10.5)
nRBC: 0 % (ref 0.0–0.2)

## 2020-09-25 MED ORDER — INSULIN ASPART 100 UNIT/ML ~~LOC~~ SOLN
0.0000 [IU] | Freq: Every day | SUBCUTANEOUS | Status: DC
  Administered 2020-09-26: 4 [IU] via SUBCUTANEOUS
  Filled 2020-09-25: qty 1

## 2020-09-25 MED ORDER — INSULIN GLARGINE 100 UNIT/ML ~~LOC~~ SOLN
20.0000 [IU] | Freq: Every day | SUBCUTANEOUS | Status: DC
  Filled 2020-09-25: qty 0.2

## 2020-09-25 MED ORDER — INSULIN ASPART 100 UNIT/ML ~~LOC~~ SOLN
25.0000 [IU] | Freq: Once | SUBCUTANEOUS | Status: AC
  Administered 2020-09-25: 25 [IU] via SUBCUTANEOUS
  Filled 2020-09-25: qty 1

## 2020-09-25 MED ORDER — ENSURE MAX PROTEIN PO LIQD
11.0000 [oz_av] | Freq: Two times a day (BID) | ORAL | Status: DC
  Administered 2020-09-25 – 2020-09-27 (×5): 11 [oz_av] via ORAL
  Filled 2020-09-25: qty 330

## 2020-09-25 MED ORDER — IPRATROPIUM-ALBUTEROL 0.5-2.5 (3) MG/3ML IN SOLN
3.0000 mL | Freq: Three times a day (TID) | RESPIRATORY_TRACT | Status: DC
  Administered 2020-09-25 – 2020-09-27 (×6): 3 mL via RESPIRATORY_TRACT
  Filled 2020-09-25 (×6): qty 3

## 2020-09-25 MED ORDER — ALBUTEROL SULFATE (2.5 MG/3ML) 0.083% IN NEBU: 2.5000 mg | INHALATION_SOLUTION | RESPIRATORY_TRACT | Status: DC | PRN

## 2020-09-25 MED ORDER — INSULIN ASPART 100 UNIT/ML ~~LOC~~ SOLN
0.0000 [IU] | SUBCUTANEOUS | Status: DC
  Administered 2020-09-25: 4 [IU] via SUBCUTANEOUS
  Filled 2020-09-25: qty 1

## 2020-09-25 MED ORDER — PROSOURCE PLUS PO LIQD
30.0000 mL | Freq: Two times a day (BID) | ORAL | Status: DC
  Administered 2020-09-25 – 2020-09-27 (×4): 30 mL via ORAL
  Filled 2020-09-25 (×5): qty 30

## 2020-09-25 MED ORDER — INSULIN GLARGINE 100 UNIT/ML ~~LOC~~ SOLN
20.0000 [IU] | Freq: Every day | SUBCUTANEOUS | Status: DC
  Administered 2020-09-25 – 2020-09-26 (×2): 20 [IU] via SUBCUTANEOUS
  Filled 2020-09-25 (×3): qty 0.2

## 2020-09-25 MED ORDER — ADULT MULTIVITAMIN W/MINERALS CH
1.0000 | ORAL_TABLET | Freq: Every day | ORAL | Status: DC
  Administered 2020-09-25 – 2020-09-27 (×3): 1 via ORAL
  Filled 2020-09-25 (×3): qty 1

## 2020-09-25 MED ORDER — INSULIN REGULAR HUMAN 100 UNIT/ML IJ SOLN: 10.0000 [IU] | Freq: Once | INTRAMUSCULAR | Status: DC

## 2020-09-25 MED ORDER — INSULIN ASPART 100 UNIT/ML ~~LOC~~ SOLN
0.0000 [IU] | Freq: Three times a day (TID) | SUBCUTANEOUS | Status: DC
  Administered 2020-09-26: 7 [IU] via SUBCUTANEOUS
  Administered 2020-09-26 – 2020-09-27 (×2): 15 [IU] via SUBCUTANEOUS
  Filled 2020-09-25 (×3): qty 1

## 2020-09-25 MED ORDER — INSULIN ASPART 100 UNIT/ML ~~LOC~~ SOLN
10.0000 [IU] | Freq: Once | SUBCUTANEOUS | Status: AC
  Administered 2020-09-25: 10 [IU] via SUBCUTANEOUS
  Filled 2020-09-25: qty 1

## 2020-09-25 MED ORDER — INSULIN ASPART 100 UNIT/ML ~~LOC~~ SOLN: 10.0000 [IU] | Freq: Once | SUBCUTANEOUS | Status: DC

## 2020-09-25 NOTE — Progress Notes (Signed)
[facility] Progress Note  Patient: Whitney Fleming    WCH:[id number]  DOA: 09/24/2020     Date of Service: the patient was seen and examined on 09/25/2020  Chief Complaint  Patient presents with  . Shortness of Breath   Brief hospital course: Arlethia Basso is a 75 y.o. female with medical history significant for asthma/COPD, diabetes mellitus, obesity, hypertension, dyslipidemia who presents to the emergency room accompanied by her daughter for evaluation of worsening shortness of breath associated with a dry cough and wheezing.  Patient had used her nebulizers at home without any improvement in her symptoms. In the ED patient was in severe respiratory distress, hypokalemia potassium 5.3 respiratory viral panel negative, Chest x-ray reviewed by me shows chronic lung disease with mildly increased streaky opacity at both lung bases from last year. Consider acute infectious exacerbation versus progression of basilar scarring/fibrosis So patient was admitted for further management as below  Assessment and Plan:   COPD with acute exacerbation Patient with a history of COPD who presents to the ER for evaluation of worsening shortness of breath associated with a dry cough and wheezing Continue as needed and scheduled nebulizer treatments S/p IV Solu-Medrol, continue prednisone 40 mg daily for total 5-day Continue inhaled steroids   Hypertension Continue carvedilol, and amlodipine D/w patient's daughter and Cozaar has been discontinued by nephrologist as an outpatient    Diabetes mellitus, uncontrolled, hyperglycemia secondary to steroids Maintain consistent carbohydrate diet S/p Lantus 10 units in am, due to persistent hyperglycemia increase Lantus to 20 units nightly  Sliding scale insulin with Accu-Cheks before meals and at bedtime    Mild hyperkalemia Most likely related to ARB use 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  Nutrition Problem: Increased nutrient needs Etiology: chronic illness (COPD exacerbation) Interventions: Interventions: MVI,Premier Protein,Prostat      Diet: Carb consistent DVT Prophylaxis: Subcutaneous Lovenox   Advance goals of care discussion: Full code  Family Communication: family was present at bedside, at the time of interview.  The pt provided permission to discuss medical plan with the family. Opportunity was given to ask question and all questions were answered satisfactorily.   Disposition:  Pt is from Home, admitted with copd, hyper K and controlled diabetes secondary to steroids, still has COPD and uncontrolled diabetes, which precludes a safe discharge. Discharge to home, when COPD and diabetes will improve most likely in 1 to 2 days.  Subjective: Patient was admitted yesterday due to acute COPD exacerbation and found to have hyperkalemia.  Patient feels improvement in the symptoms is still requiring nebs treatment and dyspnea on exertion.  Blood glucose is uncontrolled secondary to steroids.  We will continue to monitor for 1 to 2 days more. Discussed with patient's daughter over the phone  Physical Exam: General:  alert oriented to time, place, and person.  Appear in mild distress, affect appropriate Eyes: PERRLA ENT: Oral Mucosa Clear, moist  Neck: no JVD,  Cardiovascular: S1 and S2 Present, no Murmur,  Respiratory: good respiratory effort, Bilateral Air entry equal and Decreased, mild  Crackles, mild wheezes Abdomen: Bowel Sound present, Soft and mo tenderness,  Skin: mo rashes Extremities: no Pedal edema, no calf tenderness  Neurologic: without any new  focal findings Gait not checked due to patient safety concerns  Vitals:   09/25/20 0307 09/25/20 0754 09/25/20 1116 09/25/20 1146  BP: (!) 125/45 (!) 137/52 128/86   Pulse: 63 60 89 88  Resp: 18 18 18 20   Temp: 97.9 F (36.6 C) 97.9 F (36.6 C) 97.6 F (36.4 C)   TempSrc: Oral Oral Oral   SpO2: 100% 100% 100% 97%  Weight:      Height:        Intake/Output Summary (Last 24 hours) at 09/25/2020 1557 Last data filed at 09/25/2020 1000 Gross per 24 hour  Intake 10 ml  Output 0 ml  Net 10 ml   Filed Weights   09/24/20 0327  Weight: 73.9 kg    Data Reviewed: I have personally reviewed and interpreted daily labs, tele strips, imagings as discussed above. I reviewed all nursing notes, pharmacy notes, vitals, pertinent old records I have discussed plan of care as described above with RN and patient/family.  CBC: Recent Labs  Lab 09/24/20 0358 09/25/20 0609  WBC 9.3 11.3*  HGB 12.0 12.5  HCT 38.3 38.8  MCV 77.8* 75.9*  PLT 209 710   Basic Metabolic Panel: Recent Labs  Lab 09/24/20 0358 09/25/20 0609  NA 133* 133*  K 5.3* 4.6  CL 105 100  CO2 20* 22  GLUCOSE 238* 180*  BUN 18 28*  CREATININE 1.03* 1.03*  CALCIUM 8.4* 8.8*    Studies: No results found.  Scheduled Meds: . (feeding supplement) PROSource Plus  30 mL Oral BID BM  . amLODipine  5 mg Oral Daily  . aspirin EC  81 mg Oral Daily  . carvedilol  12.5 mg Oral BID WC  . enoxaparin (LOVENOX) injection  40 mg Subcutaneous Q24H  . fluticasone  1 spray Each Nare Daily  . guaiFENesin  600 mg Oral BID  . insulin aspart  0-20 Units Subcutaneous TID WC  . insulin aspart  0-5 Units Subcutaneous QHS  . insulin glargine  20 Units Subcutaneous QHS  . ipratropium-albuterol  3 mL Nebulization TID  . linagliptin  5 mg Oral Daily  . mometasone-formoterol  2 puff Inhalation BID  . montelukast  10 mg Oral QHS  . multivitamin with minerals  1 tablet Oral Daily  . pantoprazole  40 mg Oral Daily  . predniSONE  40 mg  Oral Q breakfast  . Ensure Max Protein  11 oz Oral BID  . rosuvastatin  5 mg Oral Daily  . sodium chloride flush  3 mL Intravenous Q12H   Continuous Infusions: . sodium chloride     PRN Meds: sodium chloride, albuterol, sodium chloride flush  Time spent: 35 minutes  Author: Val Riles. MD [facility] 09/25/2020 3:57 PM  To reach On-call, see care teams to locate the attending and reach out to them via [email or web address]. If 7PM-7AM, please contact night-coverage If you still have difficulty reaching the attending provider, please page the [facility] (Director on Call) for [facility] on amion for assistance.

## 2020-09-25 NOTE — Progress Notes (Signed)
MD notified that pt is refusing lab blood draw at this time.

## 2020-09-25 NOTE — Progress Notes (Signed)
Initial Nutrition Assessment  DOCUMENTATION CODES:   Not applicable  INTERVENTION:   Obtain measured weight  Ensure Max po BID, each supplement provides 150 kcal and 30 grams of protein  13ml Prosource Plus po BID, each supplement provides 100 kcals and 15 grams of protein  MVI with minerals daily  NUTRITION DIAGNOSIS:   Increased nutrient needs related to chronic illness (COPD exacerbation) as evidenced by estimated needs.  GOAL:   Patient will meet greater than or equal to 90% of their needs  MONITOR:   PO intake,Supplement acceptance,Labs,Weight trends,I & O's  REASON FOR ASSESSMENT:   Consult Assessment of nutrition requirement/status  ASSESSMENT:   Pt admitted with COPD with acute exacerbation. PMH includes asthma/COPD, DM, HTN, dyslipidemia  Pt unavailable at time of RD visit. Per H&P, pt has denied N/V and any changes in BMs. No PO intake documented.   Reviewed weight history. No significant weight changes noted, though suspect current weight was estimated/verbally obtained rather than measured. Recommend obtaining new measured weight so weight history/changes can be fully assessed.   No UOP documented  Medications: SSI TID w/ meals and bedtime, 25 units novolog x1, 20 units lantus daily, tradjenta 5 mg daily, protonix, deltasone Labs: Na 133 (L) CBGs [id number] (MD aware of elevated CBGs per RN)  Diet Order:   Diet Order            Diet Carb Modified Fluid consistency: Thin; Room service appropriate? Yes  Diet effective now                 EDUCATION NEEDS:   No education needs have been identified at this time  Skin:  Skin Assessment: Reviewed RN Assessment  Last BM:  PTA  Height:   Ht Readings from Last 1 Encounters:  09/24/20 5\' 1"  (1.549 m)    Weight:   Wt Readings from Last 1 Encounters:  09/24/20 73.9 kg   BMI:  Body mass index is 30.8 kg/m.  Estimated Nutritional Needs:  Kcal:  1700-1900 Protein:  85-95 grams Fluid:   >1.7L/d    Larkin Ina, MS, RD, LDN RD pager number and weekend/on-call pager number located in [place].

## 2020-09-25 NOTE — Evaluation (Signed)
Physical Therapy Evaluation Patient Details Name: Whitney Fleming MRN: [id number] DOB: December 26, 1945 Today's Date: 09/25/2020   History of Present Illness  Pt is a 75 y/o F admitted from home on 09/24/20 with c/c of SOB associated with nonproductive cough & wheezing. PMH: asthma, COPD, DM, obesity, HTN, dyslipidemia, cardiomegaly  Clinical Impression  Pt seen for PT evaluation with co-tx with OT for increased ease with iPad interpreter. Pt is able to complete bed mobility & transfers without assistance, and ambulate 1 lap around nurses station with 1 standing rest break with independence. Pt declines practicing stairs on this date as she doesn't want to exert herself too much 2/2 hx of "heart failure". At this time, this PT does not anticipate pt will have any issues negotiating stairs into her home as she appears to be at baseline level of function & pt agrees. Pt does not have any acute PT needs at this time, will sign off. Please re consult if new needs arise. Pt may benefit from pulmonary rehab f/u upon d/c from acute setting.    Follow Up Recommendations Supervision - Intermittent (pulmonary rehab)    Equipment Recommendations  None recommended by PT    Recommendations for Other Services       Precautions / Restrictions Precautions Precautions: None Restrictions Weight Bearing Restrictions: No      Mobility  Bed Mobility Overal bed mobility: Modified Independent             General bed mobility comments: HOB slightly elevated    Transfers Overall transfer level: Independent                  Ambulation/Gait Ambulation/Gait assistance: Independent Gait Distance (Feet): 170 Feet (pt required 1 standing rest break 2/2 SOB but SpO2 >90%) Assistive device: None Gait Pattern/deviations: Decreased stride length Gait velocity: decreased      Stairs            Wheelchair Mobility    Modified Rankin (Stroke Patients Only)       Balance Overall balance  assessment: Mild deficits observed, not formally tested                                           Pertinent Vitals/Pain Pain Assessment: No/denies pain    Home Living Family/patient expects to be discharged to:: Private residence Living Arrangements: Spouse/significant other;Children Available Help at Discharge: Family;Available 24 hours/day (husband home with her 24/7, daughter works during the day) Type of Home: House Home Access: Stairs to enter Entrance Stairs-Rails:  (reports she has something she can hold to but does not specify) Entrance Stairs-Number of Steps: 2 [place]: Two level;Able to live on main level with bedroom/bathroom Home Equipment: [name] - single point      Prior Function Level of Independence: Independent with assistive device(s)         Comments: MOD I using SPC when SOB. Reports household ambulator, endorses furniture walking, denies falls hx     Hand Dominance        Extremity/Trunk Assessment   Upper Extremity Assessment Upper Extremity Assessment: Overall WFL for tasks assessed    Lower Extremity Assessment Lower Extremity Assessment: Overall WFL for tasks assessed       Communication   Communication: Interpreter utilized [employer] ID: [phone], Whitney Fleming)  Cognition Arousal/Alertness: Awake/alert Behavior During Therapy: WFL for tasks assessed/performed Overall Cognitive Status: Within Functional Limits for  tasks assessed                                        General Comments General comments (skin integrity, edema, etc.): SpO2 >90% on room air throughout session, wheezing noted    Exercises    Assessment/Plan    PT Assessment Patent does not need any further PT services  PT Problem List         PT Treatment Interventions      PT Goals (Current goals can be found in the Care Plan section)  Acute Rehab PT Goals Patient Stated Goal: To improve breathing PT Goal Formulation: With  patient Time For Goal Achievement: 10/09/20 Potential to Achieve Goals: Good    Frequency     Barriers to discharge        Co-evaluation PT/OT/SLP Co-Evaluation/Treatment: Yes Reason for Co-Treatment: To address functional/ADL transfers (interpreter utilized) PT goals addressed during session: Mobility/safety with mobility;Balance OT goals addressed during session: ADL's and self-care       AM-PAC PT "6 Clicks" Mobility  Outcome Measure Help needed turning from your back to your side while in a flat bed without using bedrails?: None Help needed moving from lying on your back to sitting on the side of a flat bed without using bedrails?: None Help needed moving to and from a bed to a chair (including a wheelchair)?: None Help needed standing up from a chair using your arms (e.g., wheelchair or bedside chair)?: None Help needed to walk in hospital room?: None Help needed climbing 3-5 steps with a railing? : A Little 6 Click Score: 23    End of Session   Activity Tolerance: Patient tolerated treatment well Patient left: in chair;with bed alarm set;with chair alarm set Nurse Communication: Mobility status      Time: 2820-6015 PT Time Calculation (min) (ACUTE ONLY): 29 min   Charges:   PT Evaluation $PT Eval Low Complexity: 1 Low PT Treatments $Therapeutic Activity: 8-22 mins        Whitney Fleming, PT, DPT 09/25/20, 10:43 AM   Whitney Fleming 09/25/2020, 10:39 AM

## 2020-09-25 NOTE — Evaluation (Signed)
Occupational Therapy Evaluation Patient Details Name: Whitney Fleming MRN: [id number] DOB: [birth year] Today's Date: 09/25/2020    History of Present Illness Pt is a 75 y/o F admitted from home on 09/24/20 with c/c of SOB associated with nonproductive cough & wheezing. PMH: asthma, COPD, DM, obesity, HTN, dyslipidemia, cardiomegaly   Clinical Impression   Whitney Fleming was seen for OT/PT co-evaluation this date, seen together 2/2 language needs. [employer] Interpreter utilized t/o conversation Public house manager ID # [id number], Jagdishchandra). Prior to hospital admission, pt was MOD I for mobility using SPC as needed when SOB for household mobility distances. Pt lives in 2 story home c 2 STE and 1 railing, reports living with husband and daughter. Pt presents to acute OT near baseline independence to perform ADL and mobility tasks. SpO2 94% on RA during mobility. No skilled OT needs identified. Will sign off. Please re-consult if additional OT needs arise. Recommend Cardiopulmonary Rehab upon discharge to improve functional endurance.        Follow Up Recommendations  No OT follow up (cardiopulm rehab)    Equipment Recommendations  None recommended by OT    Recommendations for Other Services       Precautions / Restrictions Precautions Precautions: None Restrictions Weight Bearing Restrictions: No      Mobility Bed Mobility Overal bed mobility: Independent                  Transfers Overall transfer level: Independent                    Balance Overall balance assessment: Mild deficits observed, not formally tested                                         ADL either performed or assessed with clinical judgement   ADL Overall ADL's : Independent                                                          Pertinent Vitals/Pain Pain Assessment: No/denies pain     Hand Dominance     Extremity/Trunk Assessment Upper Extremity  Assessment Upper Extremity Assessment: Overall WFL for tasks assessed   Lower Extremity Assessment Lower Extremity Assessment: Overall WFL for tasks assessed       Communication Communication Communication: Interpreter utilized [employer] ID [phone], Jagdishchandra)   Cognition Arousal/Alertness: Awake/alert Behavior During Therapy: WFL for tasks assessed/performed Overall Cognitive Status: Within Functional Limits for tasks assessed                                     General Comments  SpO2 94% on RA during ~200 ft mobility, wheezing noted.    Exercises Exercises: Other exercises Other Exercises Other Exercises: Pt educated re: OT role, DME recs, d/c recs, falls prevention, ECS Other Exercises: LBD, sup<>sit, sitting/standing balance/tolerance, functional reach, ~200 ft mobility, self-drinking   Shoulder Instructions      Home Living Family/patient expects to be discharged to:: Private residence Living Arrangements: Spouse/significant other;Children Available Help at Discharge: Family;Available 24 hours/day (reports husband home 24/7, DTR works) Type of Home: House Home Access: Stairs to enter [employer] of  Steps: 2 Entrance Stairs-Rails:  (1 rail, did not specify) Home Layout: Two level;Able to live on main level with bedroom/bathroom               Home Equipment: Cane - single point          Prior Functioning/Environment Level of Independence: Independent with assistive device(s)        Comments: MOD I using SPC when SOB. Reports household ambulator, endorses furniture walking, denies falls hx        OT Problem List: Decreased activity tolerance         OT Goals(Current goals can be found in the care plan section) Acute Rehab OT Goals Patient Stated Goal: To improve breathing OT Goal Formulation: With patient Time For Goal Achievement: 10/09/20 Potential to Achieve Goals: Good             Co-evaluation PT/OT/SLP  Co-Evaluation/Treatment: Yes Reason for Co-Treatment: Other (comment);To address functional/ADL transfers (interpteter utilized, +2 to manage ipad interpreter) PT goals addressed during session: Mobility/safety with mobility OT goals addressed during session: ADL's and self-care      AM-PAC OT "6 Clicks" Daily Activity     Outcome Measure Help from another person eating meals?: None Help from another person taking care of personal grooming?: None Help from another person toileting, which includes using toliet, bedpan, or urinal?: None Help from another person bathing (including washing, rinsing, drying)?: None Help from another person to put on and taking off regular upper body clothing?: None Help from another person to put on and taking off regular lower body clothing?: None 6 Click Score: 24   End of Session Nurse Communication: Mobility status  Activity Tolerance: Patient tolerated treatment well Patient left: in chair;with call bell/phone within reach  OT Visit Diagnosis: Unsteadiness on feet (R26.81);Other abnormalities of gait and mobility (R26.89)                Time: 1610-9604 OT Time Calculation (min): 31 min Charges:  OT General Charges $OT Visit: 1 Visit OT Evaluation $OT Eval Low Complexity: 1 Low OT Treatments $Self Care/Home Management : 8-22 mins  Dessie Coma, M.S. OTR/L  09/25/20, 9:05 AM  ascom [phone]

## 2020-09-25 NOTE — Progress Notes (Signed)
Pts blood glucose is 427. MD notified, RN awaiting orders. STAT lab draw ordered.

## 2020-09-25 NOTE — Progress Notes (Signed)
MD notified via secure chat, pts blood sugar 433 at this time. New medication order placed.

## 2020-09-26 DIAGNOSIS — J441 Chronic obstructive pulmonary disease with (acute) exacerbation: Secondary | ICD-10-CM | POA: Diagnosis not present

## 2020-09-26 LAB — GLUCOSE, CAPILLARY
Glucose-Capillary: 217 mg/dL — ABNORMAL HIGH (ref 70–99)
Glucose-Capillary: 412 mg/dL — ABNORMAL HIGH (ref 70–99)
Glucose-Capillary: 346 mg/dL — ABNORMAL HIGH (ref 70–99)
Glucose-Capillary: 344 mg/dL — ABNORMAL HIGH (ref 70–99)

## 2020-09-26 LAB — GLUCOSE, RANDOM
Glucose, Bld: 350 mg/dL — ABNORMAL HIGH (ref 70–99)
Glucose, Bld: 444 mg/dL — ABNORMAL HIGH (ref 70–99)

## 2020-09-26 MED ORDER — ALBUTEROL SULFATE HFA 108 (90 BASE) MCG/ACT IN AERS: 2.0000 | INHALATION_SPRAY | Freq: Four times a day (QID) | RESPIRATORY_TRACT | 2 refills | Status: DC | PRN

## 2020-09-26 MED ORDER — INSULIN ASPART 100 UNIT/ML ~~LOC~~ SOLN
25.0000 [IU] | Freq: Once | SUBCUTANEOUS | Status: AC
  Administered 2020-09-26: 25 [IU] via SUBCUTANEOUS
  Filled 2020-09-26: qty 1

## 2020-09-26 MED ORDER — PREDNISONE 20 MG PO TABS
20.0000 mg | ORAL_TABLET | Freq: Every day | ORAL | Status: DC
  Administered 2020-09-27: 20 mg via ORAL
  Filled 2020-09-26: qty 1

## 2020-09-26 MED ORDER — AMOXICILLIN-POT CLAVULANATE 875-125 MG PO TABS
1.0000 | ORAL_TABLET | Freq: Two times a day (BID) | ORAL | Status: DC
  Administered 2020-09-26 – 2020-09-27 (×2): 1 via ORAL
  Filled 2020-09-26 (×3): qty 1

## 2020-09-26 MED ORDER — INSULIN GLARGINE 100 UNIT/ML ~~LOC~~ SOLN
10.0000 [IU] | Freq: Once | SUBCUTANEOUS | Status: AC
  Administered 2020-09-26: 10 [IU] via SUBCUTANEOUS
  Filled 2020-09-26: qty 0.1

## 2020-09-26 NOTE — Progress Notes (Signed)
[facility] Progress Note  Patient: Whitney Fleming    WPY:[id number]  DOA: 09/24/2020     Date of Service: the patient was seen and examined on 09/26/2020  Chief Complaint  Patient presents with  . Shortness of Breath   Brief hospital course: Dalissa Lovin is a 75 y.o. female with medical history significant for asthma/COPD, diabetes mellitus, obesity, hypertension, dyslipidemia who presents to the emergency room accompanied by her daughter for evaluation of worsening shortness of breath associated with a dry cough and wheezing.  Patient had used her nebulizers at home without any improvement in her symptoms. In the ED patient was in severe respiratory distress, hypokalemia potassium 5.3 respiratory viral panel negative, Chest x-ray reviewed by me shows chronic lung disease with mildly increased streaky opacity at both lung bases from last year. Consider acute infectious exacerbation versus progression of basilar scarring/fibrosis. So patient was admitted for further management as below  Assessment and Plan:  COPD with acute exacerbation Patient with a history of COPD who presents to the ER for evaluation of worsening shortness of breath associated with a dry cough and wheezing Continue as needed and scheduled nebulizer treatments S/p IV Solu-Medrol, s/p prednisone 40 mg daily x 2 days, decrease done tomorrow to 20 mg pod due to hyperglycemia, uncontrolled blood glucose Continue inhaled steroids Prophylactically started Augmentin as per chest x-ray shows bibasilar opacities Repeat chest x-ray after 4 weeks for complete resolution Family requested albuterol inhaler which has been prescribed and sent to the pharmacy  Hypertension Continue carvedilol, and amlodipine D/w patient's daughter and Cozaar has been discontinued by nephrologist as an outpatient    Diabetes mellitus, uncontrolled, hyperglycemia secondary to steroids Maintain consistent carbohydrate diet S/p Lantus 10 units in  am, due to persistent hyperglycemia increase Lantus to 20 units nightly  Sliding scale insulin with Accu-Cheks before meals and at bedtime 4/18 still hyperglycemia, added 10 units Lantus today morning and additional 25 units sliding scale was given due to blood glucose greater than 400 Discussed with family, we will continue to monitor blood glucose today and plan for discharge tomorrow a.m.   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  Nutrition Problem: Increased nutrient needs Etiology: chronic illness (COPD exacerbation) Interventions: Interventions: MVI,Premier Protein,Prostat      Diet: Carb consistent DVT Prophylaxis: Subcutaneous Lovenox   Advance goals of care discussion: Full code  Family Communication: family was present at bedside, at the time of interview.  The pt provided permission to discuss medical plan with the family. Opportunity was given to ask question and all questions were answered satisfactorily.   Disposition:  Pt is from Home, admitted with copd, hyper K and controlled diabetes secondary to steroids, still has COPD and uncontrolled diabetes, which precludes a safe discharge. Discharge to home, when COPD and diabetes will improve most likely in 1 to 2 days.  Subjective: No significant overnight issues, patient seems to be feeling better with the breathing, still requiring nebs.  Patient blood sugar is running high but without any symptoms.  Plan discussed with the family that we will continue monitor blood glucose level today and plan for disposition tomorrow  and we  will decrease prednisone dose tomorrow a.m.    Physical Exam: General:  alert oriented to time, place, and person.  Appear in mild distress, affect appropriate Eyes: PERRLA ENT: Oral Mucosa Clear, moist  Neck: no JVD,  Cardiovascular: S1 and S2 Present, no Murmur,  Respiratory: good respiratory effort, Bilateral Air entry equal and Decreased, mild  Crackles, mild wheezes Abdomen: Bowel Sound present, Soft and mo tenderness,  Skin: mo rashes Extremities: no Pedal edema, no calf tenderness Neurologic: without any new focal findings Gait not checked due to patient safety concerns  Vitals:   09/26/20 0502 09/26/20 0738 09/26/20 0809 09/26/20 1356  BP: (!) 125/43 (!) 146/57    Pulse: 66 70    Resp: 15 18    Temp: 97.7 F (36.5 C) 98.3 F (36.8 C)    TempSrc: Oral     SpO2: 100% 100% 99% 97%  Weight:      Height:        Intake/Output Summary (Last 24 hours) at 09/26/2020 1513 Last data filed at 09/26/2020 1424 Gross per 24 hour  Intake 480 ml  Output --  Net 480 ml   Filed Weights   09/24/20 0327  Weight: 73.9 kg    Data Reviewed: I have personally reviewed and interpreted daily labs, tele strips, imagings as discussed above. I reviewed all nursing notes, pharmacy notes, vitals, pertinent old records I have discussed plan of care as described above with RN and patient/family.  CBC: Recent Labs  Lab 09/24/20 0358 09/25/20 0609  WBC 9.3 11.3*  HGB 12.0 12.5  HCT 38.3 38.8  MCV 77.8* 75.9*  PLT 209 262   Basic Metabolic Panel: Recent Labs  Lab 09/24/20 0358 09/25/20 0609 09/26/20 1025 09/26/20 1330  NA 133* 133*  --   --   K 5.3* 4.6  --   --   CL 105 100  --   --   CO2 20* 22  --   --   GLUCOSE 238* 180* 350* 444*  BUN 18 28*  --   --   CREATININE 1.03* 1.03*  --   --   CALCIUM 8.4* 8.8*  --   --     Studies: No results found.  Scheduled Meds: . (feeding supplement) PROSource Plus  30 mL Oral BID BM  . amLODipine  5 mg Oral Daily  .  amoxicillin-clavulanate  1 tablet Oral Q12H  . aspirin EC  81 mg Oral Daily  . carvedilol  12.5 mg Oral BID WC  . enoxaparin (LOVENOX) injection  40 mg Subcutaneous Q24H  . fluticasone  1 spray Each Nare Daily  . guaiFENesin  600 mg Oral BID  . insulin aspart  0-20 Units Subcutaneous TID WC  . insulin aspart  0-5 Units Subcutaneous QHS  . insulin glargine  10 Units Subcutaneous Once  . insulin glargine  20 Units Subcutaneous QHS  . ipratropium-albuterol  3 mL Nebulization TID  . linagliptin  5 mg Oral Daily  . mometasone-formoterol  2 puff Inhalation BID  . montelukast  10 mg Oral QHS  . multivitamin with minerals  1 tablet Oral Daily  . pantoprazole  40 mg Oral Daily  . [START ON 09/27/2020] predniSONE  20 mg Oral Q breakfast  . Ensure Max Protein  11 oz Oral BID  . rosuvastatin  5 mg Oral Daily  . sodium chloride flush  3 mL Intravenous Q12H   Continuous Infusions: . sodium chloride     PRN Meds: sodium  chloride, albuterol, sodium chloride flush  Time spent: 35 minutes  Author: Val Riles. MD [facility] 09/26/2020 3:13 PM  To reach On-call, see care teams to locate the attending and reach out to them via [email or web address]. If 7PM-7AM, please contact night-coverage If you still have difficulty reaching the attending provider, please page the [facility] (Director on Call) for [facility] on amion for assistance.

## 2020-09-26 NOTE — Progress Notes (Signed)
Inpatient Diabetes Program Recommendations  AACE/ADA: New Consensus Statement on Inpatient Glycemic Control (2015)  Target Ranges:  Prepandial:   less than 140 mg/dL      Peak postprandial:   less than 180 mg/dL (1-2 hours)      Critically ill patients:  140 - 180 mg/dL   Results for Whitney Fleming, Whitney "ILA" (MRN [id number]) as of 09/26/2020 09:40  Ref. Range 09/25/2020 07:55 09/25/2020 12:35 09/25/2020 14:25 09/25/2020 16:46 09/25/2020 21:50  Glucose-Capillary Latest Ref Range: 70 - 99 mg/dL 183 (H)  4 units NOVOLOG  10 units LANTUS  427 (H)  10 units NOVOLOG  440 (H)  25 units NOVOLOG @3 :11pm 433 (H)  25 units NOVOLOG  20 units LANTUS @6 :47pm  196 (H)   Results for Whitney Fleming, Whitney "ILA" (MRN [id number]) as of 09/26/2020 09:40  Ref. Range 09/26/2020 07:37  Glucose-Capillary Latest Ref Range: 70 - 99 mg/dL 217 (H)  7 units NOVOLOG     Admit COPD with acute exacerbation  Home DM Meds: Lantus 10 units Daily       Januvia 100 mg Daily       Metformin 1000 mg BID   Current Orders: Lantus 20 units QHS       Novolog 0-20 units TID ac/hs      Tradjenta 5 mg Daily    Getting Prednisone 40 mg Daily.    MD- Note Lantus increased yesterday (pt got total of 30 units yest--10 in the AM and another 20 last PM). Only scheduled to get 20 units tonight.  Please consider:  1. Increase Lantus to 25 units QHS  2. Start Novolog Meal Coverage: Novolog 6 units TID with meals to start Hold if pt eats <50% of meal, Hold if pt NPO    --Will follow patient during hospitalization--  Wyn Quaker RN, MSN, CDE Diabetes Coordinator Inpatient Glycemic Control Team Team Pager: [phone] (8a-5p)

## 2020-09-26 NOTE — Plan of Care (Signed)

## 2020-09-27 DIAGNOSIS — R3981 Functional urinary incontinence: Secondary | ICD-10-CM | POA: Diagnosis not present

## 2020-09-27 DIAGNOSIS — J45909 Unspecified asthma, uncomplicated: Secondary | ICD-10-CM | POA: Diagnosis not present

## 2020-09-27 DIAGNOSIS — R531 Weakness: Secondary | ICD-10-CM | POA: Diagnosis not present

## 2020-09-27 DIAGNOSIS — J441 Chronic obstructive pulmonary disease with (acute) exacerbation: Secondary | ICD-10-CM | POA: Diagnosis not present

## 2020-09-27 LAB — BLOOD GAS, VENOUS
Acid-base deficit: 6.1 mmol/L — ABNORMAL HIGH (ref 0.0–2.0)
Bicarbonate: 20.7 mmol/L (ref 20.0–28.0)
O2 Saturation: 89.5 %
Patient temperature: 37
pCO2, Ven: 45 mmHg (ref 44.0–60.0)
pH, Ven: 7.27 (ref 7.250–7.430)
pO2, Ven: 66 mmHg — ABNORMAL HIGH (ref 32.0–45.0)

## 2020-09-27 LAB — BASIC METABOLIC PANEL
Anion gap: 6 (ref 5–15)
BUN: 42 mg/dL — ABNORMAL HIGH (ref 8–23)
CO2: 24 mmol/L (ref 22–32)
Calcium: 8.5 mg/dL — ABNORMAL LOW (ref 8.9–10.3)
Chloride: 105 mmol/L (ref 98–111)
Creatinine, Ser: 0.92 mg/dL (ref 0.44–1.00)
GFR, Estimated: >60 mL/min (ref >60)
Glucose, Bld: 242 mg/dL — ABNORMAL HIGH (ref 70–99)
Potassium: 4.8 mmol/L (ref 3.5–5.1)
Sodium: 135 mmol/L (ref 135–145)

## 2020-09-27 LAB — CBC
HCT: 35.9 % — ABNORMAL LOW (ref 36.0–46.0)
Hemoglobin: 11.2 g/dL — ABNORMAL LOW (ref 12.0–15.0)
MCH: 24.3 pg — ABNORMAL LOW (ref 26.0–34.0)
MCHC: 31.2 g/dL (ref 30.0–36.0)
MCV: 77.9 fL — ABNORMAL LOW (ref 80.0–100.0)
Platelets: 221 10*3/uL (ref 150–400)
RBC: 4.61 MIL/uL (ref 3.87–5.11)
RDW: 14.9 % (ref 11.5–15.5)
WBC: 10 10*3/uL (ref 4.0–10.5)
nRBC: 0 % (ref 0.0–0.2)

## 2020-09-27 LAB — MAGNESIUM: Magnesium: 2 mg/dL (ref 1.7–2.4)

## 2020-09-27 LAB — PHOSPHORUS: Phosphorus: 3.3 mg/dL (ref 2.5–4.6)

## 2020-09-27 LAB — GLUCOSE, CAPILLARY: Glucose-Capillary: 327 mg/dL — ABNORMAL HIGH (ref 70–99)

## 2020-09-27 MED ORDER — ROSUVASTATIN CALCIUM 5 MG PO TABS: 5.0000 mg | ORAL_TABLET | Freq: Every day | ORAL | 1 refills | Status: DC

## 2020-09-27 MED ORDER — PREDNISONE 10 MG PO TABS: 20.0000 mg | ORAL_TABLET | Freq: Every day | ORAL | 0 refills | Status: DC

## 2020-09-27 MED ORDER — METFORMIN HCL 500 MG PO TABS: 1000.0000 mg | ORAL_TABLET | Freq: Two times a day (BID) | ORAL | Status: DC

## 2020-09-27 MED ORDER — LANTUS SOLOSTAR 100 UNIT/ML ~~LOC~~ SOPN: 20.0000 [IU] | PEN_INJECTOR | Freq: Every day | SUBCUTANEOUS | 5 refills | Status: DC

## 2020-09-27 MED ORDER — INSULIN GLARGINE 100 UNIT/ML ~~LOC~~ SOLN: 25.0000 [IU] | Freq: Every day | SUBCUTANEOUS | Status: DC

## 2020-09-27 MED ORDER — INSULIN GLARGINE 100 UNIT/ML ~~LOC~~ SOLN
20.0000 [IU] | Freq: Every day | SUBCUTANEOUS | Status: DC
  Filled 2020-09-27: qty 0.2

## 2020-09-27 MED ORDER — AMOXICILLIN-POT CLAVULANATE 875-125 MG PO TABS: 1.0000 | ORAL_TABLET | Freq: Two times a day (BID) | ORAL | 0 refills | Status: AC

## 2020-09-27 NOTE — Plan of Care (Signed)
Problem: Education: Goal: Knowledge of General Education information will improve Description: Including pain rating scale, medication(s)/side effects and non-pharmacologic comfort measures 09/27/2020 1058 by Whitney Novak, RN Outcome: Adequate for Discharge 09/27/2020 1058 by Whitney Novak, RN Outcome: Adequate for Discharge   Problem: Health Behavior/Discharge Planning: Goal: Ability to manage health-related needs will improve 09/27/2020 1058 by Whitney Novak, RN Outcome: Adequate for Discharge 09/27/2020 1058 by Whitney Novak, RN Outcome: Adequate for Discharge   Problem: Clinical Measurements: Goal: Ability to maintain clinical measurements within normal limits will improve 09/27/2020 1058 by Whitney Novak, RN Outcome: Adequate for Discharge 09/27/2020 1058 by Whitney Novak, RN Outcome: Adequate for Discharge Goal: Will remain free from infection 09/27/2020 1058 by Whitney Novak, RN Outcome: Adequate for Discharge 09/27/2020 1058 by Whitney Novak, RN Outcome: Adequate for Discharge Goal: Diagnostic test results will improve 09/27/2020 1058 by Whitney Novak, RN Outcome: Adequate for Discharge 09/27/2020 1058 by Whitney Novak, RN Outcome: Adequate for Discharge Goal: Respiratory complications will improve 09/27/2020 1058 by Whitney Novak, RN Outcome: Adequate for Discharge 09/27/2020 1058 by Whitney Novak, RN Outcome: Adequate for Discharge Goal: Cardiovascular complication will be avoided 09/27/2020 1058 by Whitney Novak, RN Outcome: Adequate for Discharge 09/27/2020 1058 by Whitney Novak, RN Outcome: Adequate for Discharge   Problem: Activity: Goal: Risk for activity intolerance will decrease 09/27/2020 1058 by Whitney Novak, RN Outcome: Adequate for Discharge 09/27/2020 1058 by Whitney Novak, RN Outcome: Adequate for Discharge   Problem: Nutrition: Goal: Adequate nutrition will be maintained 09/27/2020 1058 by  Whitney Novak, RN Outcome: Adequate for Discharge 09/27/2020 1058 by Whitney Novak, RN Outcome: Adequate for Discharge   Problem: Coping: Goal: Level of anxiety will decrease 09/27/2020 1058 by Whitney Novak, RN Outcome: Adequate for Discharge 09/27/2020 1058 by Whitney Novak, RN Outcome: Adequate for Discharge   Problem: Elimination: Goal: Will not experience complications related to bowel motility 09/27/2020 1058 by Whitney Novak, RN Outcome: Adequate for Discharge 09/27/2020 1058 by Whitney Novak, RN Outcome: Adequate for Discharge Goal: Will not experience complications related to urinary retention 09/27/2020 1058 by Whitney Novak, RN Outcome: Adequate for Discharge 09/27/2020 1058 by Whitney Novak, RN Outcome: Adequate for Discharge   Problem: Pain Managment: Goal: General experience of comfort will improve 09/27/2020 1058 by Whitney Novak, RN Outcome: Adequate for Discharge 09/27/2020 1058 by Whitney Novak, RN Outcome: Adequate for Discharge   Problem: Safety: Goal: Ability to remain free from injury will improve 09/27/2020 1058 by Whitney Novak, RN Outcome: Adequate for Discharge 09/27/2020 1058 by Whitney Novak, RN Outcome: Adequate for Discharge   Problem: Skin Integrity: Goal: Risk for impaired skin integrity will decrease 09/27/2020 1058 by Whitney Novak, RN Outcome: Adequate for Discharge 09/27/2020 1058 by Whitney Novak, RN Outcome: Adequate for Discharge   Problem: Education: Goal: Ability to describe self-care measures that may prevent or decrease complications (Diabetes Survival Skills Education) will improve 09/27/2020 1058 by Whitney Novak, RN Outcome: Adequate for Discharge 09/27/2020 1058 by Whitney Novak, RN Outcome: Adequate for Discharge Goal: Individualized Educational Video(s) 09/27/2020 1058 by Whitney Novak, RN Outcome: Adequate for Discharge 09/27/2020 1058 by Whitney Novak,  RN Outcome: Adequate for Discharge   Problem: Coping: Goal: Ability to adjust to condition or change in health will improve 09/27/2020 1058 by Whitney Novak, RN Outcome: Adequate for Discharge 09/27/2020 1058 by Whitney Novak,  RN Outcome: Adequate for Discharge   Problem: Fluid Volume: Goal: Ability to maintain a balanced intake and output will improve 09/27/2020 1058 by Whitney Novak, RN Outcome: Adequate for Discharge 09/27/2020 1058 by Whitney Novak, RN Outcome: Adequate for Discharge   Problem: Health Behavior/Discharge Planning: Goal: Ability to identify and utilize available resources and services will improve 09/27/2020 1058 by Whitney Novak, RN Outcome: Adequate for Discharge 09/27/2020 1058 by Whitney Novak, RN Outcome: Adequate for Discharge Goal: Ability to manage health-related needs will improve 09/27/2020 1058 by Whitney Novak, RN Outcome: Adequate for Discharge 09/27/2020 1058 by Whitney Novak, RN Outcome: Adequate for Discharge   Problem: Metabolic: Goal: Ability to maintain appropriate glucose levels will improve 09/27/2020 1058 by Whitney Novak, RN Outcome: Adequate for Discharge 09/27/2020 1058 by Whitney Novak, RN Outcome: Adequate for Discharge   Problem: Nutritional: Goal: Maintenance of adequate nutrition will improve 09/27/2020 1058 by Whitney Novak, RN Outcome: Adequate for Discharge 09/27/2020 1058 by Whitney Novak, RN Outcome: Adequate for Discharge Goal: Progress toward achieving an optimal weight will improve 09/27/2020 1058 by Whitney Novak, RN Outcome: Adequate for Discharge 09/27/2020 1058 by Whitney Novak, RN Outcome: Adequate for Discharge   Problem: Skin Integrity: Goal: Risk for impaired skin integrity will decrease 09/27/2020 1058 by Whitney Novak, RN Outcome: Adequate for Discharge 09/27/2020 1058 by Whitney Novak, RN Outcome: Adequate for Discharge   Problem: Tissue  Perfusion: Goal: Adequacy of tissue perfusion will improve 09/27/2020 1058 by Whitney Novak, RN Outcome: Adequate for Discharge 09/27/2020 1058 by Whitney Novak, RN Outcome: Adequate for Discharge   Problem: Increased Nutrient Needs (NI-5.1) Goal: Food and/or nutrient delivery Description: Individualized approach for food/nutrient provision. Outcome: Adequate for Discharge

## 2020-09-27 NOTE — Discharge Summary (Signed)
[facility] [place] NAME: Whitney Fleming    MR#:  [id number]  DATE OF BIRTH:  09/24/1945  DATE OF ADMISSION:  75 ADMITTING PHYSICIAN: Collier Bullock, MD  DATE OF DISCHARGE: 09/27/2020  PRIMARY CARE PHYSICIAN: [name], MD    ADMISSION DIAGNOSIS:  COPD exacerbation ([place]) [J44.1]  DISCHARGE DIAGNOSIS:  Acute on chronic COPD exacerbation Type-2 DM, uncontrolled in the setting of steroids HTN  SECONDARY DIAGNOSIS:   Past Medical History:  Diagnosis Date  . Anemia    normocytic anemia  . Asthma   . Cardiomegaly   . COPD (chronic obstructive pulmonary disease) ([place])   . Diabetes mellitus without complication ([place])   . Hypertension   . Hyponatremia     HOSPITAL COURSE:  Whitney Fleming a 75 y.o.femalewith medical history significant forasthma/COPD, diabetes mellitus, obesity, hypertension, dyslipidemia who presents to the emergency room  for evaluation of worsening shortness of breath associated with a dry cough and wheezing.  COPD with acute on chronic exacerbation --Patient with a history of COPD who presents to the ER for evaluation of worsening shortness of breath associated with a dry coughand wheezing --Continue as needed and scheduled nebulizer treatments -- IV Solu-Medrol, s/p prednisone 40 mg daily x 2 days--taper with po over next couple days --Continue inhaled steroids and prn nebs--pt has at home --Prophylactically started Augmentin as per chest x-ray shows bibasilar opacities --Family requested albuterol inhaler which has been prescribed and sent to the pharmacy -- Sats hundred percent on room air.  Hypertension --Continue carvedilol, and amlodipine D/w patient's daughter and Cozaar has been discontinued by nephrologist as an outpatient   Diabetes mellitus, uncontrolled, hyperglycemia secondary to steroids --Maintain consistent carbohydrate diet --S/p Lantus 10 units in am, due to persistent  hyperglycemia increase Lantus to 20 units nightly and d/w dter to reduce dose according to sugars at home --Sliding scale insulin with Accu-Cheks before meals and at bedtime -- resumed Jardiance, Januvia, metformin (home meds)  Mild hyperkalemia --S/p Lokelma, hyperkalemia resolved --As per patient's daughter she has been taken off of losartan and started amlodipine by nephrologist as an outpatient  GERD Continue Protonix  Obesity Complicates overall prognosis andcare   Body mass index is 30.8 kg/m.  Nutrition Problem: Increased nutrient needs Etiology: chronic illness (COPD exacerbation) Interventions: Interventions: MVI,Premier Protein,Prostat    overall improved. Appears at baseline. Discussed with patient patient's daughter on the phone and granddaughter in the room discharge plan.  Seen by PT OT-- no needs at home. Continue supervision at home   CONSULTS OBTAINED:    DRUG ALLERGIES:  No Known Allergies  DISCHARGE MEDICATIONS:   Allergies as of 09/27/2020   No Known Allergies     Medication List    STOP taking these medications   azithromycin 250 MG tablet Commonly known as: ZITHROMAX   fluticasone 50 MCG/ACT nasal spray Commonly known as: FLONASE   losartan 50 MG tablet Commonly known as: COZAAR     TAKE these medications   Accu-Chek FastClix Lancets Misc Use as directed to check blood glucose twice daily   Accu-Chek Guide test strip Generic drug: glucose blood USE AS DIRECTED TWICE DAILY   albuterol (2.5 MG/3ML) 0.083% nebulizer solution Commonly known as: PROVENTIL Take 3 mLs (2.5 mg total) by nebulization every 6 (six) hours as needed for wheezing or shortness of breath. What changed: Another medication with the same name was added. Make sure you understand how and when to take each.   albuterol  108 (90 Base) MCG/ACT inhaler Commonly known as: VENTOLIN HFA Inhale 2 puffs into the lungs every 6 (six) hours as needed for wheezing or  shortness of breath. What changed: You were already taking a medication with the same name, and this prescription was added. Make sure you understand how and when to take each.   AMBULATORY NON FORMULARY MEDICATION Medication Name: Aero chamber use as directed. DX:J45.909   amLODipine 5 MG tablet Commonly known as: NORVASC Take 5 mg by mouth daily.   amoxicillin-clavulanate 875-125 MG tablet Commonly known as: AUGMENTIN Take 1 tablet by mouth every 12 (twelve) hours for 3 days.   aspirin EC 81 MG tablet Take 1 tablet (81 mg total) by mouth daily. Swallow whole.   budesonide-formoterol 160-4.5 MCG/ACT inhaler Commonly known as: Symbicort Inhale 2 puffs into the lungs 2 (two) times daily. Rinse mouth after use   carvedilol 12.5 MG tablet Commonly known as: COREG Take 1 tablet (12.5 mg total) by mouth 2 (two) times daily with a meal.   empagliflozin 25 MG Tabs tablet Commonly known as: Jardiance Take 1 tablet (25 mg total) by mouth daily.   FreeStyle Libre 14 Day Reader Kerrin Mo Use to test blood glucose throughout the day, continuously   FreeStyle Libre 14 Day Sensor Misc Use for 14 days to monitor glucose continuously   Lantus SoloStar 100 UNIT/ML Solostar Pen Generic drug: insulin glargine Inject 20 Units into the skin daily. Decrease to 10 units if sugars less then 200 What changed:   how much to take  additional instructions   metFORMIN 1000 MG tablet Commonly known as: GLUCOPHAGE Take 1 tablet (1,000 mg total) by mouth 2 (two) times daily with a meal.   montelukast 10 MG tablet Commonly known as: SINGULAIR Take 1 tablet (10 mg total) by mouth at bedtime.   Mucinex 600 MG 12 hr tablet Generic drug: guaiFENesin Take 1 tablet (600 mg total) by mouth 2 (two) times daily.   NovoFine 32G X 6 MM Misc Generic drug: Insulin Pen Needle Use as directed to inject insulin daily   omeprazole 20 MG capsule Commonly known as: PRILOSEC Take 1 capsule (20 mg total) by  mouth 2 (two) times daily before a meal.   predniSONE 10 MG tablet Commonly known as: DELTASONE Take 2 tablets (20 mg total) by mouth daily with breakfast. Take 20 mg for 2 days then decrease to 10 mg for 2 days and stop Start taking on: September 28, 2020   rosuvastatin 5 MG tablet Commonly known as: CRESTOR Take 1 tablet (5 mg total) by mouth daily.   sitaGLIPtin 100 MG tablet Commonly known as: Januvia Take 1 tablet (100 mg total) by mouth daily.       If you experience worsening of your admission symptoms, develop shortness of breath, life threatening emergency, suicidal or homicidal thoughts you must seek medical attention immediately by calling 911 or calling your MD immediately  if symptoms less severe.  You Must read complete instructions/literature along with all the possible adverse reactions/side effects for all the Medicines you take and that have been prescribed to you. Take any new Medicines after you have completely understood and accept all the possible adverse reactions/side effects.   Please note  You were cared for by a hospitalist during your hospital stay. If you have any questions about your discharge medications or the care you received while you were in the hospital after you are discharged, you can call the unit and asked to speak with  the hospitalist on call if the hospitalist that took care of you is not available. Once you are discharged, your primary care physician will handle any further medical issues. Please note that NO REFILLS for any discharge medications will be authorized once you are discharged, as it is imperative that you return to your primary care physician (or establish a relationship with a primary care physician if you do not have one) for your aftercare needs so that they can reassess your need for medications and monitor your lab values. Today   SUBJECTIVE   Feels good Family at bedside  VITAL SIGNS:  Blood pressure (!) 135/51, pulse 72,  temperature 97.9 F (36.6 C), resp. rate 18, height 5\' 1"  (1.549 m), weight 73.9 kg, SpO2 100 %.  I/O:    Intake/Output Summary (Last 24 hours) at 09/27/2020 1008 Last data filed at 09/26/2020 1424 Gross per 24 hour  Intake 480 ml  Output --  Net 480 ml    PHYSICAL EXAMINATION:  GENERAL:  75 y.o.-year-old patient lying in the bed with no acute distress.  Obese LUNGS: Normal breath sounds bilaterally, no wheezing, rales,rhonchi or crepitation. No use of accessory muscles of respiration.  CARDIOVASCULAR: S1, S2 normal. No murmurs, rubs, or gallops.  ABDOMEN: Soft, non-tender, non-distended. Bowel sounds present. No organomegaly or mass.  EXTREMITIES: No pedal edema, cyanosis, or clubbing.  NEUROLOGIC: Cranial nerves II through XII are intact. Muscle strength 5/5 in all extremities. Sensation intact. Gait not checked.  PSYCHIATRIC: The patient is alert and oriented x 3.  SKIN: No obvious rash, lesion, or ulcer.   DATA REVIEW:   CBC  Recent Labs  Lab 09/27/20 0521  WBC 10.0  HGB 11.2*  HCT 35.9*  PLT 221    Chemistries  Recent Labs  Lab 09/24/20 0358 09/25/20 0609 09/27/20 0521  NA 133*   < > 135  K 5.3*   < > 4.8  CL 105   < > 105  CO2 20*   < > 24  GLUCOSE 238*   < > 242*  BUN 18   < > 42*  CREATININE 1.03*   < > 0.92  CALCIUM 8.4*   < > 8.5*  MG  --   --  2.0  AST 17  --   --   ALT 16  --   --   ALKPHOS 61  --   --   BILITOT 0.8  --   --    < > = values in this interval not displayed.    Microbiology Results   Recent Results (from the past 240 hour(s))  Resp Panel by RT-PCR (Flu A&B, Covid) Nasopharyngeal Swab     Status: None   Collection Time: 09/24/20  7:23 AM   Specimen: Nasopharyngeal Swab; Nasopharyngeal(NP) swabs in vial transport medium  Result Value Ref Range Status   SARS Coronavirus 2 by RT PCR NEGATIVE NEGATIVE Final    Comment: (NOTE) SARS-CoV-2 target nucleic acids are NOT DETECTED.  The SARS-CoV-2 RNA is generally detectable in upper  respiratory specimens during the acute phase of infection. The lowest concentration of SARS-CoV-2 viral copies this assay can detect is 138 copies/mL. A negative result does not preclude SARS-Cov-2 infection and should not be used as the sole basis for treatment or other patient management decisions. A negative result may occur with  improper specimen collection/handling, submission of specimen other than nasopharyngeal swab, presence of viral mutation(s) within the areas targeted by this assay, and inadequate number of viral copies(<138  copies/mL). A negative result must be combined with clinical observations, patient history, and epidemiological information. The expected result is Negative.  Fact Sheet for Patients:  EntrepreneurPulse.com.au  Fact Sheet for Healthcare Providers:  IncredibleEmployment.be  This test is no t yet approved or cleared by the [place] FDA and  has been authorized for detection and/or diagnosis of SARS-CoV-2 by FDA under an Emergency Use Authorization (EUA). This EUA will remain  in effect (meaning this test can be used) for the duration of the COVID-19 declaration under Section 564(b)(1) of the Act, 21 U.S.C.section 360bbb-3(b)(1), unless the authorization is terminated  or revoked sooner.       Influenza A by PCR NEGATIVE NEGATIVE Final   Influenza B by PCR NEGATIVE NEGATIVE Final    Comment: (NOTE) The Xpert Xpress SARS-CoV-2/FLU/RSV plus assay is intended as an aid in the diagnosis of influenza from Nasopharyngeal swab specimens and should not be used as a sole basis for treatment. Nasal washings and aspirates are unacceptable for Xpert Xpress SARS-CoV-2/FLU/RSV testing.  Fact Sheet for Patients: EntrepreneurPulse.com.au  Fact Sheet for Healthcare Providers: IncredibleEmployment.be  This test is not yet approved or cleared by the [place] FDA and has been  authorized for detection and/or diagnosis of SARS-CoV-2 by FDA under an Emergency Use Authorization (EUA). This EUA will remain in effect (meaning this test can be used) for the duration of the COVID-19 declaration under Section 564(b)(1) of the Act, 21 U.S.C. section 360bbb-3(b)(1), unless the authorization is terminated or revoked.  Performed at [facility], [address] [place], [place] [address]     RADIOLOGY:  No results found.   CODE STATUS:     Code Status Orders  (From admission, onward)         Start     Ordered   09/24/20 0848  Full code  Continuous        09/24/20 0849        Code Status History    Date Active Date Inactive Code Status Order ID Comments User Context   08/18/2019 0148 08/19/2019 1733 Full Code [id number]  Sueanne Margarita, DO ED   02/11/2018 0445 02/12/2018 1910 Full Code [id number]  Arta Silence, MD Inpatient   12/02/2017 2312 12/05/2017 1753 Full Code [id number]  Amelia Jo, MD ED   09/26/2017 0131 09/29/2017 1501 Full Code [id number]  Lance Coon, MD Inpatient   Advance Care Planning Activity       TOTAL TIME TAKING CARE OF THIS PATIENT: *40* minutes.    Fritzi Mandes M.D  [facility]    CC: Primary care physician; [name], MD

## 2020-09-27 NOTE — TOC Progression Note (Addendum)
Transition of Care [facility]) - Progression Note    Patient Details  Name: Whitney Fleming MRN: [id number] Date of Birth: 1946-02-04  Transition of [facility]) CM/SW Jordan, RN Phone Number: 09/27/2020, 10:21 AM  Clinical Narrative:    The patient will DC home today, she will get a RW brought to the room prior to DC, No HH needed, she stated that she does not need anything else, her family is in the room that speaks [place]        Expected Discharge Plan and Services           Expected Discharge Date: 09/27/20                                     Social Determinants of Health (SDOH) Interventions    Readmission Risk Interventions No flowsheet data found.

## 2020-09-27 NOTE — Discharge Instructions (Signed)
Use your nebs and inhalers as before

## 2020-09-28 ENCOUNTER — Telehealth: Payer: Self-pay

## 2020-09-28 DIAGNOSIS — J411 Mucopurulent chronic bronchitis: Secondary | ICD-10-CM

## 2020-09-28 DIAGNOSIS — R0600 Dyspnea, unspecified: Secondary | ICD-10-CM

## 2020-09-28 DIAGNOSIS — I10 Essential (primary) hypertension: Secondary | ICD-10-CM

## 2020-09-28 DIAGNOSIS — R0609 Other forms of dyspnea: Secondary | ICD-10-CM

## 2020-09-28 NOTE — Addendum Note (Signed)
Addended by: Aviva Signs M on: 09/28/2020 12:51 PM   Modules accepted: Orders

## 2020-09-28 NOTE — Addendum Note (Signed)
Addended by: Aviva Signs M on: 09/28/2020 12:59 PM   Modules accepted: Orders

## 2020-09-28 NOTE — Telephone Encounter (Signed)
Transition Care Management Follow-up Telephone Call  Date of discharge and from where: 09/27/2020 from [facility]  How have you been since you were released from the hospital? Spoke to pt dtr "Manisha" and she stated that mom is feeling much better today and she has started her meds rx'ed by hospital. [facility] would like help to set up transportation services. They are also interested in mail order for medications. There is also concern for paying for bills and food. I have entered in the referral for [facility] MM services to assist with these requests.   Any questions or concerns? No  Items Reviewed:  Did the pt receive and understand the discharge instructions provided? Yes   Medications obtained and verified? Yes   Other? No   Any new allergies since your discharge? No   Dietary orders reviewed? n/a  Do you have support at home? Yes   Functional Questionnaire: (I = Independent and D = Dependent) ADLs: I  Bathing/Dressing- I  Meal Prep- I  Eating- I  Maintaining continence- I  Transferring/Ambulation- D - patient uses a walker.   Managing Meds- D   Follow up appointments reviewed:   PCP Hospital f/u appt confirmed? No  Dtr will call to schedule  [facility] f/u appt confirmed? No  Dtr will call to schedule  Are transportation arrangements needed? No   If their condition worsens, is the pt aware to call PCP or go to the Emergency Dept.? Yes  Was the patient provided with contact information for the PCP's office or ED? Yes  Was to pt encouraged to call back with questions or concerns? Yes

## 2020-10-04 ENCOUNTER — Telehealth: Payer: Self-pay | Admitting: Family Medicine

## 2020-10-04 NOTE — Telephone Encounter (Signed)
   Telephone encounter was:  Successful.  10/04/2020 Name: Bentley Haralson MRN: [id number] DOB: 1946/04/24  Kyley Solow is a 75 y.o. year old female who is a primary care patient of Bacigalupo, Dionne Bucy, MD . The community resource team was consulted for assistance with Transportation Needs   Care guide performed the following interventions: Patient provided with information about care guide support team and interviewed to confirm resource needs Discussed resources to assist with transportation through [place]. Sent daughter an email with the patients information and the number for Medicaid. Opened up a new referral with daughter to help with financial needs. bills are not in this patients name and therefore I cannot help with [facility] to pay the bills.  .  Follow Up Plan:  No further follow up planned at this time. The patient has been provided with needed resources.  [place], Care Management Phone: [phone] Email: [email or web address]

## 2020-10-04 NOTE — Telephone Encounter (Signed)
   Telephone encounter was:  Unsuccessful.  10/04/2020 Name: Whitney Fleming MRN: [id number] DOB: 11-28-1945  Unsuccessful outbound call made today to assist with:  Transportation Needs  and Financial Difficulties related to I don't know yet. Used interpreter line and had to leave a message in [place].   Outreach Attempt:  1st Attempt  A HIPAA compliant voice message was left requesting a return call.  Instructed patient to call back at [phone]; interpreter left the message for me while I was on the phone. Interpreter line is [phone].   [place], Care Management Phone: [phone] Email: [email or web address]

## 2020-10-05 ENCOUNTER — Other Ambulatory Visit: Payer: Self-pay

## 2020-10-13 ENCOUNTER — Inpatient Hospital Stay: Payer: Medicaid Other | Admitting: Family Medicine

## 2020-10-14 ENCOUNTER — Ambulatory Visit: Payer: Self-pay

## 2020-10-17 ENCOUNTER — Other Ambulatory Visit: Payer: Self-pay

## 2020-10-17 DIAGNOSIS — J4550 Severe persistent asthma, uncomplicated: Secondary | ICD-10-CM

## 2020-10-17 MED ORDER — ALBUTEROL SULFATE (2.5 MG/3ML) 0.083% IN NEBU: 2.5000 mg | INHALATION_SOLUTION | Freq: Four times a day (QID) | RESPIRATORY_TRACT | 0 refills | Status: DC | PRN

## 2020-10-17 NOTE — Patient Outreach (Signed)
Spoke with patient's daughter last week, Manish, who asked I call on 10/17/20 at 1430. Called 3 times and no answer, left VM.  At appt last week, patient's daughter didn't seem interested in services. Left my Phone # for them to call back but will not schedule f/u call to re-schedule patient. Open to F/U if they call me back.

## 2020-10-20 ENCOUNTER — Inpatient Hospital Stay: Payer: Medicaid Other | Admitting: Family Medicine

## 2020-10-23 ENCOUNTER — Other Ambulatory Visit: Payer: Self-pay | Admitting: Adult Health

## 2020-10-23 ENCOUNTER — Other Ambulatory Visit: Payer: Self-pay | Admitting: Internal Medicine

## 2020-10-23 DIAGNOSIS — J4550 Severe persistent asthma, uncomplicated: Secondary | ICD-10-CM

## 2020-10-24 ENCOUNTER — Telehealth: Payer: Self-pay | Admitting: Internal Medicine

## 2020-10-24 DIAGNOSIS — J4550 Severe persistent asthma, uncomplicated: Secondary | ICD-10-CM

## 2020-10-24 MED ORDER — ALBUTEROL SULFATE (2.5 MG/3ML) 0.083% IN NEBU: 2.5000 mg | INHALATION_SOLUTION | Freq: Four times a day (QID) | RESPIRATORY_TRACT | 1 refills | Status: DC | PRN

## 2020-10-24 NOTE — Telephone Encounter (Signed)
  Notes to clinic:  medication requested has been d/c Review for continued use    Requested Prescriptions  Pending Prescriptions Disp Refills   azithromycin (ZITHROMAX) 250 MG tablet [Pharmacy Med Name: Azithromycin 250 MG Oral Tablet] 13 tablet 0    Sig: TAKE 1 TABLET BY MOUTH DAILY ON MONDAY, WEDNESDAY, AND FRIDAY      Off-Protocol Failed - 10/23/2020  1:07 PM      Failed - Medication not assigned to a protocol, review manually.      Passed - Valid encounter within last 12 months    Recent Outpatient Visits           2 months ago Type 2 diabetes mellitus with hyperglycemia, without long-term current use of insulin [facility])   [facility] [place], Dionne Bucy, MD   5 months ago Encounter for annual physical exam   [facility], Dionne Bucy, MD   11 months ago Dyspnea on exertion   [facility], Dionne Bucy, MD   1 year ago Severe persistent asthma without complication   [facility] [place], Dionne Bucy, MD   1 year ago Type 2 diabetes mellitus with hyperglycemia, without long-term current use of insulin [facility])   [facility], Dionne Bucy, MD       Future Appointments             In 1 week Chrismon, Vickki Muff, PA-C [employer], [place]

## 2020-10-24 NOTE — Telephone Encounter (Signed)
90 day supply of albuterol solution has been sent to preferred pharmacy.  Patient's daughter, Margarita Rana) is aware and voiced her understanding.  Nothing further needed at this time.

## 2020-11-01 ENCOUNTER — Other Ambulatory Visit: Payer: Self-pay

## 2020-11-01 ENCOUNTER — Ambulatory Visit: Payer: Medicaid Other | Admitting: Family Medicine

## 2020-11-01 VITALS — BP 130/47 | HR 77 | Wt 168.0 lb

## 2020-11-01 DIAGNOSIS — Z794 Long term (current) use of insulin: Secondary | ICD-10-CM

## 2020-11-01 DIAGNOSIS — E1165 Type 2 diabetes mellitus with hyperglycemia: Secondary | ICD-10-CM | POA: Diagnosis not present

## 2020-11-01 DIAGNOSIS — I10 Essential (primary) hypertension: Secondary | ICD-10-CM | POA: Diagnosis not present

## 2020-11-01 DIAGNOSIS — J441 Chronic obstructive pulmonary disease with (acute) exacerbation: Secondary | ICD-10-CM | POA: Diagnosis not present

## 2020-11-01 MED ORDER — AZITHROMYCIN 250 MG PO TABS: ORAL_TABLET | ORAL | 3 refills | Status: DC

## 2020-11-01 NOTE — Progress Notes (Signed)
Established patient visit   Patient: Whitney Fleming   DOB: April 06, 1946   75 y.o. Female  MRN: [id number] Visit Date: 11/01/2020  Today's healthcare provider: Vernie Murders, PA-C   No chief complaint on file.  Subjective    HPI  Diabetes Mellitus Type II, Follow-up  Lab Results  Component Value Date   HGBA1C 10.4 (A) 08/02/2020   HGBA1C 10.4 (H) 04/28/2020   HGBA1C 7.9 (H) 08/18/2019   Wt Readings from Last 3 Encounters:  11/01/20 168 lb (76.2 kg)  09/24/20 163 lb (73.9 kg)  08/02/20 168 lb (76.2 kg)   Last seen for diabetes 3 months ago.  Management since then includes Increased Lantus, and noted that patient was on Prednisone. She reports good compliance with treatment. She is not having side effects.  Symptoms: No fatigue No foot ulcerations  No appetite changes No nausea  No paresthesia of the feet  No polydipsia  No polyuria No visual disturbances   No vomiting     Home blood sugar records:  Home readings running in the 200's  Episodes of hypoglycemia? No    Current insulin regiment: none  Patient would also like to have referral for hearing check and possibly getting hearing aids.   Patient has exertional shortness of breath and would like a portable tank of oxygen.  She sees pulmonology and has appointment next month.  Her daughter would like something set up before that time.  She does use nebulizer daily.  Patient is on Azithromicin every Mon Wed and Frid.  She needs to have this refilled.  Pertinent Labs: Lab Results  Component Value Date   CHOL 127 04/28/2020   HDL 49 04/28/2020   LDLCALC 54 04/28/2020   TRIG 138 04/28/2020   CHOLHDL 2.6 04/28/2020   Lab Results  Component Value Date   NA 135 09/27/2020   K 4.8 09/27/2020   CREATININE 0.92 09/27/2020   GFRNONAA >60 09/27/2020   GFRAA 45 (L) 08/02/2020   GLUCOSE 242 (H) 09/27/2020      ---------------------------------------------------------------------------------------------------   Past Medical History:  Diagnosis Date   Anemia    normocytic anemia   Asthma    Cardiomegaly    COPD (chronic obstructive pulmonary disease) (HCC)    Diabetes mellitus without complication (HCC)    Hypertension    Hyponatremia    Past Surgical History:  Procedure Laterality Date   TUBAL LIGATION     Social History   Tobacco Use   Smoking status: Never   Smokeless tobacco: Never  Vaping Use   Vaping Use: Never used  Substance Use Topics   Alcohol use: Never   Drug use: Never   Family Status  Relation Name Status   Other  (Not Specified)   Mother  Deceased   Father  Deceased   Neg Hx  (Not Specified)   No Known Allergies     Medications: Outpatient Medications Prior to Visit  Medication Sig   Accu-Chek FastClix Lancets MISC Use as directed to check blood glucose twice daily   albuterol (PROVENTIL) (2.5 MG/3ML) 0.083% nebulizer solution Take 3 mLs (2.5 mg total) by nebulization every 6 (six) hours as needed for wheezing or shortness of breath.   albuterol (VENTOLIN HFA) 108 (90 Base) MCG/ACT inhaler Inhale 2 puffs into the lungs every 6 (six) hours as needed for wheezing or shortness of breath.   AMBULATORY NON FORMULARY MEDICATION Medication Name: Aero chamber use as directed. DX:J45.909   amLODipine (NORVASC) 5 MG tablet  Take 5 mg by mouth daily.   aspirin EC 81 MG tablet Take 1 tablet (81 mg total) by mouth daily. Swallow whole.   azithromycin (ZITHROMAX) 250 MG tablet TAKE 1 TABLET BY MOUTH DAILY ON MONDAY, WEDNESDAY, AND FRIDAY   budesonide-formoterol (SYMBICORT) 160-4.5 MCG/ACT inhaler Inhale 2 puffs into the lungs 2 (two) times daily. Rinse mouth after use   carvedilol (COREG) 12.5 MG tablet Take 1 tablet (12.5 mg total) by mouth 2 (two) times daily with a meal.   Continuous Blood Gluc Receiver (FREESTYLE LIBRE 14 DAY READER) DEVI Use to test blood glucose  throughout the day, continuously   Continuous Blood Gluc Sensor (FREESTYLE LIBRE 14 DAY SENSOR) MISC Use for 14 days to monitor glucose continuously   empagliflozin (JARDIANCE) 25 MG TABS tablet Take 1 tablet (25 mg total) by mouth daily. (Patient not taking: Reported on 09/24/2020)   glucose blood (ACCU-CHEK GUIDE) test strip USE AS DIRECTED TWICE DAILY   insulin glargine (LANTUS SOLOSTAR) 100 UNIT/ML Solostar Pen Inject 20 Units into the skin daily. Decrease to 10 units if sugars less then 200   Insulin Pen Needle (NOVOFINE) 32G X 6 MM MISC Use as directed to inject insulin daily (Patient not taking: Reported on 09/24/2020)   metFORMIN (GLUCOPHAGE) 1000 MG tablet Take 1 tablet (1,000 mg total) by mouth 2 (two) times daily with a meal.   montelukast (SINGULAIR) 10 MG tablet Take 1 tablet (10 mg total) by mouth at bedtime. (Patient not taking: Reported on 09/24/2020)   omeprazole (PRILOSEC) 20 MG capsule Take 1 capsule (20 mg total) by mouth 2 (two) times daily before a meal.   rosuvastatin (CRESTOR) 5 MG tablet Take 1 tablet (5 mg total) by mouth daily.   sitaGLIPtin (JANUVIA) 100 MG tablet Take 1 tablet (100 mg total) by mouth daily.   [DISCONTINUED] predniSONE (DELTASONE) 10 MG tablet Take 2 tablets (20 mg total) by mouth daily with breakfast. Take 20 mg for 2 days then decrease to 10 mg for 2 days and stop   No facility-administered medications prior to visit.    Review of Systems  Constitutional: Negative.   HENT: Negative.   Respiratory: Positive for shortness of breath.   Cardiovascular: Negative.   Gastrointestinal: Negative.        Objective    BP (!) 130/47 (BP Location: Right Arm, Patient Position: Sitting, Cuff Size: Normal)   Pulse 77   Wt 168 lb (76.2 kg)   SpO2 100%   BMI 31.74 kg/m  BP Readings from Last 3 Encounters:  11/01/20 (!) 130/47  09/27/20 (!) 135/51  08/02/20 124/75   Wt Readings from Last 3 Encounters:  11/01/20 168 lb (76.2 kg)  09/24/20 163 lb (73.9  kg)  08/02/20 168 lb (76.2 kg)     Physical Exam Constitutional:      General: She is not in acute distress.    Appearance: She is well-developed.  HENT:     Head: Normocephalic and atraumatic.     Right Ear: Hearing normal.     Left Ear: Hearing normal.     Nose: Nose normal.  Eyes:     General: Lids are normal. No scleral icterus.       Right eye: No discharge.        Left eye: No discharge.     Conjunctiva/sclera: Conjunctivae normal.  Cardiovascular:     Rate and Rhythm: Normal rate and regular rhythm.     Pulses: Normal pulses.     Heart sounds:  Normal heart sounds.  Pulmonary:     Effort: Pulmonary effort is normal. No respiratory distress.  Abdominal:     General: Bowel sounds are normal.     Palpations: Abdomen is soft.  Musculoskeletal:        General: Normal range of motion.     Cervical back: Neck supple.  Skin:    Findings: No lesion or rash.  Neurological:     Mental Status: She is alert and oriented to person, place, and time.  Psychiatric:        Speech: Speech normal.        Behavior: Behavior normal.        Thought Content: Thought content normal.       No results found for any visits on 11/01/20.  Assessment & Plan     1. COPD exacerbation (HCC) Flare of COPD with asthma. No fever or loss of taste or smell. Hospitalized 09-24-20 to 09-27-20 with acute exacerbation and uncontrolled diabetes due to steroids. Encouraged to use Albuterol prn and Symbicort  2 puffs BID regularly with Singulair daily. May add Mucinex and given refill of Azithromycin 1 tablet MWF as recommended by pulmonology in the past. - azithromycin (ZITHROMAX) 250 MG tablet; TAKE 1 TABLET BY MOUTH DAILY ON MONDAY, WEDNESDAY, AND FRIDAY  Dispense: 13 tablet; Refill: 3  2. Type 2 diabetes mellitus with hyperglycemia, with long-term current use of insulin (HCC) Last Hgb A1C was 10.4 on 08-02-20. Presently on Lantus 20 units qd, Januvia 100 mg qd (no longer taking Jardiance) and Metformin  1000 mg BID. Recheck with PCP.  3. Primary hypertension BP well controlled today. Presently on Amlodipine 5 mg qd and Coreg 12.5 mg BID. Cozaar wast discontinued by nephrologist due to hyperkalemia. Will recheck with nephrology (dr. Lanora Manis) on 11-10-20 and review labs.   No follow-ups on file.      I, Io Dieujuste, PA-C, have reviewed all documentation for this visit. The documentation on 02/27/21 for the exam, diagnosis, procedures, and orders are all accurate and complete.    Vernie Murders, PA-C  [employer] [phone] (phone) [phone] (fax)  [place]

## 2020-11-08 ENCOUNTER — Ambulatory Visit: Payer: Self-pay | Admitting: Family Medicine

## 2020-11-08 ENCOUNTER — Ambulatory Visit
Admission: RE | Admit: 2020-11-08 | Discharge: 2020-11-08 | Disposition: A | Discharge status: Home / Self Care | Payer: Medicaid Other | Source: Ambulatory Visit | Attending: Nephrology | Admitting: Nephrology

## 2020-11-08 ENCOUNTER — Other Ambulatory Visit: Payer: Self-pay

## 2020-11-08 DIAGNOSIS — R8290 Unspecified abnormal findings in urine: Secondary | ICD-10-CM | POA: Diagnosis not present

## 2020-11-08 DIAGNOSIS — E875 Hyperkalemia: Secondary | ICD-10-CM | POA: Insufficient documentation

## 2020-11-08 DIAGNOSIS — E1122 Type 2 diabetes mellitus with diabetic chronic kidney disease: Secondary | ICD-10-CM | POA: Insufficient documentation

## 2020-11-08 DIAGNOSIS — N189 Chronic kidney disease, unspecified: Secondary | ICD-10-CM | POA: Diagnosis not present

## 2020-11-08 DIAGNOSIS — N1832 Chronic kidney disease, stage 3b: Secondary | ICD-10-CM | POA: Insufficient documentation

## 2020-11-09 DIAGNOSIS — R3981 Functional urinary incontinence: Secondary | ICD-10-CM | POA: Diagnosis not present

## 2020-11-09 DIAGNOSIS — J45909 Unspecified asthma, uncomplicated: Secondary | ICD-10-CM | POA: Diagnosis not present

## 2020-11-10 DIAGNOSIS — E1122 Type 2 diabetes mellitus with diabetic chronic kidney disease: Secondary | ICD-10-CM | POA: Diagnosis not present

## 2020-11-10 DIAGNOSIS — E119 Type 2 diabetes mellitus without complications: Secondary | ICD-10-CM | POA: Diagnosis not present

## 2020-11-10 DIAGNOSIS — E1169 Type 2 diabetes mellitus with other specified complication: Secondary | ICD-10-CM | POA: Diagnosis not present

## 2020-11-10 DIAGNOSIS — R809 Proteinuria, unspecified: Secondary | ICD-10-CM | POA: Diagnosis not present

## 2020-11-10 DIAGNOSIS — N1832 Chronic kidney disease, stage 3b: Secondary | ICD-10-CM | POA: Diagnosis not present

## 2020-11-10 DIAGNOSIS — E782 Mixed hyperlipidemia: Secondary | ICD-10-CM | POA: Diagnosis not present

## 2020-11-10 DIAGNOSIS — I10 Essential (primary) hypertension: Secondary | ICD-10-CM | POA: Diagnosis not present

## 2020-11-10 DIAGNOSIS — E875 Hyperkalemia: Secondary | ICD-10-CM | POA: Diagnosis not present

## 2020-11-21 ENCOUNTER — Encounter: Payer: Self-pay | Admitting: Emergency Medicine

## 2020-11-21 ENCOUNTER — Emergency Department
Admission: EM | Admit: 2020-11-21 | Discharge: 2020-11-21 | Disposition: A | Discharge status: Home / Self Care | Payer: Medicaid Other | Attending: Emergency Medicine | Admitting: Emergency Medicine

## 2020-11-21 ENCOUNTER — Emergency Department: Payer: Medicaid Other

## 2020-11-21 ENCOUNTER — Other Ambulatory Visit: Payer: Self-pay

## 2020-11-21 DIAGNOSIS — I10 Essential (primary) hypertension: Secondary | ICD-10-CM | POA: Diagnosis not present

## 2020-11-21 DIAGNOSIS — J441 Chronic obstructive pulmonary disease with (acute) exacerbation: Secondary | ICD-10-CM | POA: Insufficient documentation

## 2020-11-21 DIAGNOSIS — Z794 Long term (current) use of insulin: Secondary | ICD-10-CM | POA: Diagnosis not present

## 2020-11-21 DIAGNOSIS — E1169 Type 2 diabetes mellitus with other specified complication: Secondary | ICD-10-CM | POA: Diagnosis not present

## 2020-11-21 DIAGNOSIS — E785 Hyperlipidemia, unspecified: Secondary | ICD-10-CM | POA: Insufficient documentation

## 2020-11-21 DIAGNOSIS — J4550 Severe persistent asthma, uncomplicated: Secondary | ICD-10-CM | POA: Diagnosis not present

## 2020-11-21 DIAGNOSIS — Z7984 Long term (current) use of oral hypoglycemic drugs: Secondary | ICD-10-CM | POA: Diagnosis not present

## 2020-11-21 DIAGNOSIS — Z7982 Long term (current) use of aspirin: Secondary | ICD-10-CM | POA: Diagnosis not present

## 2020-11-21 DIAGNOSIS — Z79899 Other long term (current) drug therapy: Secondary | ICD-10-CM | POA: Insufficient documentation

## 2020-11-21 DIAGNOSIS — J4551 Severe persistent asthma with (acute) exacerbation: Secondary | ICD-10-CM | POA: Diagnosis not present

## 2020-11-21 DIAGNOSIS — R0602 Shortness of breath: Secondary | ICD-10-CM | POA: Diagnosis not present

## 2020-11-21 LAB — CBC
HCT: 40.6 % (ref 36.0–46.0)
Hemoglobin: 12.6 g/dL (ref 12.0–15.0)
MCH: 23.9 pg — ABNORMAL LOW (ref 26.0–34.0)
MCHC: 31 g/dL (ref 30.0–36.0)
MCV: 76.9 fL — ABNORMAL LOW (ref 80.0–100.0)
Platelets: 283 10*3/uL (ref 150–400)
RBC: 5.28 MIL/uL — ABNORMAL HIGH (ref 3.87–5.11)
RDW: 14.9 % (ref 11.5–15.5)
WBC: 10.1 10*3/uL (ref 4.0–10.5)
nRBC: 0 % (ref 0.0–0.2)

## 2020-11-21 LAB — COMPREHENSIVE METABOLIC PANEL
ALT: 15 U/L (ref 0–44)
AST: 18 U/L (ref 15–41)
Albumin: 3.5 g/dL (ref 3.5–5.0)
Alkaline Phosphatase: 61 U/L (ref 38–126)
Anion gap: 7 (ref 5–15)
BUN: 21 mg/dL (ref 8–23)
CO2: 23 mmol/L (ref 22–32)
Calcium: 8.9 mg/dL (ref 8.9–10.3)
Chloride: 102 mmol/L (ref 98–111)
Creatinine, Ser: 1.1 mg/dL — ABNORMAL HIGH (ref 0.44–1.00)
GFR, Estimated: 52 mL/min — ABNORMAL LOW (ref >60)
Glucose, Bld: 330 mg/dL — ABNORMAL HIGH (ref 70–99)
Potassium: 5 mmol/L (ref 3.5–5.1)
Sodium: 132 mmol/L — ABNORMAL LOW (ref 135–145)
Total Bilirubin: 0.7 mg/dL (ref 0.3–1.2)
Total Protein: 7.1 g/dL (ref 6.5–8.1)

## 2020-11-21 MED ORDER — ALBUTEROL SULFATE (2.5 MG/3ML) 0.083% IN NEBU: 2.5000 mg | INHALATION_SOLUTION | Freq: Four times a day (QID) | RESPIRATORY_TRACT | 1 refills | Status: DC | PRN

## 2020-11-21 MED ORDER — PREDNISONE 50 MG PO TABS: 50.0000 mg | ORAL_TABLET | Freq: Every day | ORAL | 0 refills | Status: DC

## 2020-11-21 MED ORDER — IPRATROPIUM-ALBUTEROL 0.5-2.5 (3) MG/3ML IN SOLN
3.0000 mL | Freq: Once | RESPIRATORY_TRACT | Status: AC
  Administered 2020-11-21: 3 mL via RESPIRATORY_TRACT
  Filled 2020-11-21: qty 3

## 2020-11-21 MED ORDER — GUAIFENESIN ER 600 MG PO TB12: 600.0000 mg | ORAL_TABLET | Freq: Two times a day (BID) | ORAL | 2 refills | Status: DC

## 2020-11-21 MED ORDER — METHYLPREDNISOLONE SODIUM SUCC 125 MG IJ SOLR
125.0000 mg | Freq: Once | INTRAMUSCULAR | Status: AC
  Administered 2020-11-21: 125 mg via INTRAVENOUS
  Filled 2020-11-21: qty 2

## 2020-11-21 NOTE — ED Triage Notes (Signed)
Pt via POV from home. Pt is accompanied by daughter, pt does not speak [place] and daughter is needed to translate. Pt c/o SOB and productive cough since this AM. Pt has a hx of COPD. Denies needing to wear O2 at home. Pt is A&Ox4 and NAD.

## 2020-11-21 NOTE — ED Notes (Signed)
X-Ray at bedside.

## 2020-11-21 NOTE — ED Provider Notes (Signed)
[facility] Emergency Department Provider Note   ____________________________________________    I have reviewed the triage vital signs and the nursing notes.   HISTORY  Chief Complaint Shortness of breath Daughter is interpreting   HPI Whitney Fleming is a 75 y.o. female with a history of COPD, diabetes, high blood pressure presents with shortness of breath.  Symptoms worsened over the last 2 days and significantly worse this morning.  Denies fevers chills or cough.  No calf pain or swelling.  No chest pain, some mild chest tightness attributed to COPD  Past Medical History:  Diagnosis Date   Anemia    normocytic anemia   Asthma    Cardiomegaly    COPD (chronic obstructive pulmonary disease) (HCC)    Diabetes mellitus without complication ([place])    Hypertension    Hyponatremia     Patient Active Problem List   Diagnosis Date Noted   COPD exacerbation ([place]) 09/24/2020   Hyperkalemia 09/24/2020   Dyspnea on exertion 11/18/2019   Asthma exacerbation 08/18/2019   COPD (chronic obstructive pulmonary disease) ([place]) 03/03/2018   Hyperlipidemia associated with type 2 diabetes mellitus ([place]) 12/20/2017   GERD (gastroesophageal reflux disease) 12/20/2017   Respiratory failure ([place]) 12/02/2017   Asthma 09/26/2017   HTN (hypertension) 09/26/2017   Diabetes ([place]) 09/26/2017    Past Surgical History:  Procedure Laterality Date   TUBAL LIGATION      Prior to Admission medications   Medication Sig Start Date End Date Taking? Authorizing Provider  guaiFENesin (MUCINEX) 600 MG 12 hr tablet Take 1 tablet (600 mg total) by mouth 2 (two) times daily. 11/21/20 11/21/21 Yes Lavonia Drafts, MD  predniSONE (DELTASONE) 50 MG tablet Take 1 tablet (50 mg total) by mouth daily with breakfast. 11/21/20  Yes Lavonia Drafts, MD  Accu-Chek FastClix Lancets MISC Use as directed to check blood glucose twice daily 06/27/20   [name], MD  albuterol (PROVENTIL)  (2.5 MG/3ML) 0.083% nebulizer solution Take 3 mLs (2.5 mg total) by nebulization every 6 (six) hours as needed for wheezing or shortness of breath. 11/21/20   Lavonia Drafts, MD  albuterol (VENTOLIN HFA) 108 (90 Base) MCG/ACT inhaler Inhale 2 puffs into the lungs every 6 (six) hours as needed for wheezing or shortness of breath. 09/26/20   Val Riles, MD  AMBULATORY NON FORMULARY MEDICATION Medication Name: Aero chamber use as directed. JY:N82.956 12/24/17   [name], MD  amLODipine (NORVASC) 5 MG tablet Take 5 mg by mouth daily.    [provider]  aspirin EC 81 MG tablet Take 1 tablet (81 mg total) by mouth daily. Swallow whole. 01/08/20   Minna Merritts, MD  azithromycin (ZITHROMAX) 250 MG tablet TAKE 1 TABLET BY MOUTH DAILY ON MONDAY, WEDNESDAY, AND FRIDAY 11/01/20   Chrismon, Vickki Muff, PA-C  budesonide-formoterol (SYMBICORT) 160-4.5 MCG/ACT inhaler Inhale 2 puffs into the lungs 2 (two) times daily. Rinse mouth after use 09/19/20   Flora Lipps, MD  carvedilol (COREG) 12.5 MG tablet Take 1 tablet (12.5 mg total) by mouth 2 (two) times daily with a meal. 06/27/20   Bacigalupo, Dionne Bucy, MD  Continuous Blood Gluc Receiver (FREESTYLE LIBRE 14 DAY READER) DEVI Use to test blood glucose throughout the day, continuously 06/27/20   Flinchum, Kelby Aline, FNP  Continuous Blood Gluc Sensor (FREESTYLE LIBRE 14 DAY SENSOR) MISC Use for 14 days to monitor glucose continuously 06/27/20   Flinchum, Kelby Aline, FNP  empagliflozin (JARDIANCE) 25 MG TABS tablet Take 1  tablet (25 mg total) by mouth daily. Patient not taking: Reported on 09/24/2020 06/27/20   [name], MD  glucose blood (ACCU-CHEK GUIDE) test strip USE AS DIRECTED TWICE DAILY 06/27/20   [name], MD  insulin glargine (LANTUS SOLOSTAR) 100 UNIT/ML Solostar Pen Inject 20 Units into the skin daily. Decrease to 10 units if sugars less then 200 09/27/20   Fritzi Mandes, MD  Insulin Pen Needle (NOVOFINE) 32G X 6 MM MISC  Use as directed to inject insulin daily Patient not taking: Reported on 09/24/2020 11/18/19   [name], MD  metFORMIN (GLUCOPHAGE) 1000 MG tablet Take 1 tablet (1,000 mg total) by mouth 2 (two) times daily with a meal. 06/27/20   Bacigalupo, Dionne Bucy, MD  montelukast (SINGULAIR) 10 MG tablet Take 1 tablet (10 mg total) by mouth at bedtime. Patient not taking: Reported on 09/24/2020 06/27/20   [name], MD  omeprazole (PRILOSEC) 20 MG capsule Take 1 capsule (20 mg total) by mouth 2 (two) times daily before a meal. 07/13/20   Martyn Ehrich, NP  rosuvastatin (CRESTOR) 5 MG tablet Take 1 tablet (5 mg total) by mouth daily. 09/27/20   Fritzi Mandes, MD  sitaGLIPtin (JANUVIA) 100 MG tablet Take 1 tablet (100 mg total) by mouth daily. 06/27/20   [name], MD     Allergies Patient has no known allergies.  Family History  Problem Relation Age of Onset   Tuberculosis Other    Healthy Mother    Heart disease Father    Asthma Father    Cancer Neg Hx     Social History Social History   Tobacco Use   Smoking status: Never   Smokeless tobacco: Never  Vaping Use   Vaping Use: Never used  Substance Use Topics   Alcohol use: Never   Drug use: Never    Review of Systems  Constitutional: No fever/chills Eyes: No visual changes.  ENT: No sore throat. Cardiovascular: Denies chest pain. Respiratory: As above Gastrointestinal: No abdominal pain.  No nausea, no vomiting.   Genitourinary: Negative for dysuria. Musculoskeletal: Negative for back pain. Skin: Negative for rash. Neurological: Negative for headaches or weakness   ____________________________________________   PHYSICAL EXAM:  VITAL SIGNS: ED Triage Vitals  Enc Vitals Group     BP      Pulse      Resp      Temp      Temp src      SpO2      Weight      Height      Head Circumference      Peak Flow      Pain Score      Pain Loc      Pain Edu?      Excl. in [place]?     Constitutional:  Alert and oriented.  Eyes: Conjunctivae are normal.   Nose: No congestion/rhinnorhea. Mouth/Throat: Mucous membranes are moist.   Neck:  Painless ROM Cardiovascular: Normal rate, regular rhythm. Grossly normal heart sounds.  Good peripheral circulation. Respiratory: Increased respiratory effort with tachypnea, diffuse wheezing, poor to moderate airflow Gastrointestinal: Soft and nontender. No distention.    Musculoskeletal: No lower extremity tenderness nor edema.  Warm and well perfused Neurologic:  Normal speech and language. No gross focal neurologic deficits are appreciated.  Skin:  Skin is warm, dry and intact. No rash noted. Psychiatric: Mood and affect are normal. Speech and behavior are normal.  ____________________________________________   LABS (  all labs ordered are listed, but only abnormal results are displayed)  Labs Reviewed  CBC - Abnormal; Notable for the following components:      Result Value   RBC 5.28 (*)    MCV 76.9 (*)    MCH 23.9 (*)    All other components within normal limits  COMPREHENSIVE METABOLIC PANEL - Abnormal; Notable for the following components:   Sodium 132 (*)    Glucose, Bld 330 (*)    Creatinine, Ser 1.10 (*)    GFR, Estimated 52 (*)    All other components within normal limits   ____________________________________________  EKG  ED ECG REPORT I, Lavonia Drafts, the attending physician, personally viewed and interpreted this ECG.  Date: 11/21/2020  Rhythm: normal sinus rhythm QRS Axis: normal Intervals: normal ST/T Wave abnormalities: normal Narrative Interpretation: no evidence of acute ischemia  ____________________________________________  RADIOLOGY  Chest x-ray reviewed by me, no acute abnormality ____________________________________________   PROCEDURES  Procedure(s) performed: No  Procedures   Critical Care performed: No ____________________________________________   INITIAL IMPRESSION / ASSESSMENT AND  PLAN / ED COURSE  Pertinent labs & imaging results that were available during my care of the patient were reviewed by me and considered in my medical decision making (see chart for details).   Patient presents with shortness of breath and wheezing in the setting of a history of COPD.  Will treat with duo nebs, Solu-Medrol, obtain chest x-ray labs and monitor carefully.  ----------------------------------------- 9:47 AM on 11/21/2020 ----------------------------------------- Patient significantly improved after treatment, wheezing significantly better.  Daughter feels patient looks to be at her baseline.  We will start the patient on prednisone, daughter and patient know that this can raise her glucose level.  Do think that she needs it though given her exacerbation.  Appropriate discharge at this time, return precautions discussed, patient and family are in agreement with the plan    ____________________________________________   FINAL CLINICAL IMPRESSION(S) / ED DIAGNOSES  Final diagnoses:  COPD exacerbation ([place])  Severe persistent asthma, unspecified whether complicated        Note:  This document was prepared using Dragon voice recognition software and may include unintentional dictation errors.    Lavonia Drafts, MD 11/21/20 [phone]

## 2020-11-22 ENCOUNTER — Telehealth: Payer: Self-pay

## 2020-11-22 NOTE — Telephone Encounter (Signed)
Transition Care Management Follow-up Telephone Call Date of discharge and from where: 11/21/2020 from [facility] How have you been since you were released from the hospital?  Pt daughter states that she is feeling some better, no chest pain or sob. Prednisone and mucinex is tolerated well.   Called the pharmacy and they stated that the albuterol was filled last on 10/28/2020 for 45 day supply 566ml. 1069ml for 90day of the albuterol.   [facility] has neb rx for albuterol  Any questions or concerns? Yes Concern about the albuterol neb and running out.   Items Reviewed: Did the pt receive and understand the discharge instructions provided? Yes  Medications obtained and verified? Yes  Other? No  Any new allergies since your discharge? No  Dietary orders reviewed? No Do you have support at home? Yes   Functional Questionnaire: (I = Independent and D = Dependent) ADLs: I  Bathing/Dressing- I  Meal Prep- I  Eating- I  Maintaining continence- I  Transferring/Ambulation- I  Managing Meds- I   Follow up appointments reviewed:  PCP Hospital f/u appt confirmed? No   Specialist Hospital f/u appt confirmed? Yes  Scheduled to see Flora Lipps, MD on 11/29/2020 @ 10:45am. Are transportation arrangements needed? No  If their condition worsens, is the pt aware to call PCP or go to the Emergency Dept.? Yes Was the patient provided with contact information for the PCP's office or ED? Yes Was to pt encouraged to call back with questions or concerns? Yes

## 2020-11-29 ENCOUNTER — Ambulatory Visit: Payer: Medicaid Other | Admitting: Internal Medicine

## 2020-11-29 ENCOUNTER — Encounter: Payer: Self-pay | Admitting: Internal Medicine

## 2020-11-29 ENCOUNTER — Telehealth: Payer: Self-pay | Admitting: Cardiovascular Disease

## 2020-11-29 ENCOUNTER — Other Ambulatory Visit: Payer: Self-pay

## 2020-11-29 VITALS — BP 122/40 | HR 79 | Temp 98.0°F | Ht 61.0 in | Wt 160.8 lb

## 2020-11-29 DIAGNOSIS — J4550 Severe persistent asthma, uncomplicated: Secondary | ICD-10-CM | POA: Diagnosis not present

## 2020-11-29 DIAGNOSIS — R0689 Other abnormalities of breathing: Secondary | ICD-10-CM | POA: Diagnosis not present

## 2020-11-29 DIAGNOSIS — J4540 Moderate persistent asthma, uncomplicated: Secondary | ICD-10-CM | POA: Diagnosis not present

## 2020-11-29 MED ORDER — ALBUTEROL SULFATE (2.5 MG/3ML) 0.083% IN NEBU: 2.5000 mg | INHALATION_SOLUTION | Freq: Four times a day (QID) | RESPIRATORY_TRACT | 1 refills | Status: DC | PRN

## 2020-11-29 NOTE — Patient Instructions (Signed)
HIGH RECOMMENDATION TO SEE CARDIOLOGY FOR STRESS TEST  CONTINUE ALBUTEROL NEBS EVERY 4 HRS AS NEEDED CONTINUE SYMBICORT AS PRESCRIBED  Check 6MWT and check ONO

## 2020-11-29 NOTE — Addendum Note (Signed)
Addended by: Carlisle Cater on: 11/29/2020 01:23 PM   Modules accepted: Orders

## 2020-11-29 NOTE — Telephone Encounter (Signed)
Patient came by office  Dr Mortimer Fries recommended patient has stress test - scheduled from old order from 2021 Please advise if it is ok to use this old order or if new orders needs to be placed and please call with instructions.

## 2020-11-29 NOTE — Progress Notes (Signed)
* [facility]  Date: 11/29/2020  MRN# [id number] Whitney Fleming 09/27/1945  Whitney Fleming is a 75 y.o. old female seen in follow up for chief complaint of dyspnea.   SYNOPSIS Whitney Fleming is a 75 y.o. female  with severe allergic asthma.  She speaks [place] her daughter translates.  She has had 3 admissions to the hospital in 2019 with asthma exacerbations including most recently in September 2019.  She had not had further hospital admissions thus far this year but required 2 courses of prednisone in May 2020 which caused her blood sugar to spike.  She has not been very compliant with her medications, she has a Symbicort inhaler, however at her last visit she noted that it had run out and she had not bothered refilling it subsequently her asthma became worse.  At last visit she was strongly advised to make sure that she uses her Symbicort inhaler daily as instructed, use albuterol inhaler as needed.  She continues to rely on and asked for courses of prednisone, however we have instructed her that this is only a rescue medication and has several deleterious effects therefore she should be maintained on her regular medications and use prednisone only for exacerbations.  She was asked to continue azithromycin 2 and 50 mg 5 days/week.  She previously had been evaluated for an injectable biologic agent for asthma but declined because she did not want injections.   At last visit she was reminded to use Symbicort inhaler 2 puffs twice daily, in addition to her Xopenex nebulizer.  We previously considered her for Nucala but declined anything that involve injections.  She was therefore maintained on azithromycin 5 times weekly. Patient is noted to have her breathing getting worse, and they were worried that she had to go to ED, she went to see her physician the next day and received prednisone which helped.   She has now started taking symbicort 2 puffs twice per day and rinses per day. She is  taking singulair, azithro 5 days per week. She is off the prednisone and doing well with that. She is using rescue inhaler she is using 2 times per day, pulmicort nebs twice per day.    **IgE 12/24/17; IGe 5.  **CXR 11/05/17; changes of chronic bronchitis, right heart enlargement.  **CBC 02/11/18>> absolute eosinophil count 800. **CBC 09/25/17; Abs eos count 300.    CC Follow up ASTHMA  HPI  Chronic shortness of breath patient with chronic persistent wheezing Chronic dyspnea exertion Patiently currently on prednisone therapy for wheezing Patient refused to see cardiology for stress test Patient uses Symbicort and nebulized albuterol as needed   Most of her history is provided by her daughter  Overall her prognosis is poor Patient advised to see cardiology for stress test to assess for cardiac evaluation Most likely etiology is cardiac wheezing I have encouraged the patient and the daughter to pursue cardiac evaluation At this time patient refuses to go any type of intervention at this time  I will need to assess for exertional and nocturnal hypoxia Will check 6-minute walk test and overnight pulse oximetry    Medication:    Current Outpatient Medications:    Accu-Chek FastClix Lancets MISC, Use as directed to check blood glucose twice daily, Disp: 100 each, Rfl: 1   albuterol (PROVENTIL) (2.5 MG/3ML) 0.083% nebulizer solution, Take 3 mLs (2.5 mg total) by nebulization every 6 (six) hours as needed for wheezing or shortness of breath., Disp: 1008 mL, Rfl: 1  albuterol (VENTOLIN HFA) 108 (90 Base) MCG/ACT inhaler, Inhale 2 puffs into the lungs every 6 (six) hours as needed for wheezing or shortness of breath., Disp: 8 g, Rfl: 2   AMBULATORY NON FORMULARY MEDICATION, Medication Name: Aero chamber use as directed. DX:J45.909, Disp: 1 each, Rfl: 0   amLODipine (NORVASC) 5 MG tablet, Take 5 mg by mouth daily., Disp: , Rfl:    aspirin EC 81 MG tablet, Take 1 tablet (81 mg total) by  mouth daily. Swallow whole., Disp: 90 tablet, Rfl: 3   azithromycin (ZITHROMAX) 250 MG tablet, TAKE 1 TABLET BY MOUTH DAILY ON MONDAY, WEDNESDAY, AND FRIDAY, Disp: 13 tablet, Rfl: 3   budesonide-formoterol (SYMBICORT) 160-4.5 MCG/ACT inhaler, Inhale 2 puffs into the lungs 2 (two) times daily. Rinse mouth after use, Disp: 3 each, Rfl: 3   carvedilol (COREG) 12.5 MG tablet, Take 1 tablet (12.5 mg total) by mouth 2 (two) times daily with a meal., Disp: 180 tablet, Rfl: 1   Continuous Blood Gluc Receiver (FREESTYLE LIBRE 14 DAY READER) DEVI, Use to test blood glucose throughout the day, continuously, Disp: 1 each, Rfl: 5   Continuous Blood Gluc Sensor (FREESTYLE LIBRE 14 DAY SENSOR) MISC, Use for 14 days to monitor glucose continuously, Disp: 6 each, Rfl: 3   empagliflozin (JARDIANCE) 25 MG TABS tablet, Take 1 tablet (25 mg total) by mouth daily. (Patient not taking: Reported on 09/24/2020), Disp: 90 tablet, Rfl: 1   glucose blood (ACCU-CHEK GUIDE) test strip, USE AS DIRECTED TWICE DAILY, Disp: 50 each, Rfl: 1   guaiFENesin (MUCINEX) 600 MG 12 hr tablet, Take 1 tablet (600 mg total) by mouth 2 (two) times daily., Disp: 60 tablet, Rfl: 2   insulin glargine (LANTUS SOLOSTAR) 100 UNIT/ML Solostar Pen, Inject 20 Units into the skin daily. Decrease to 10 units if sugars less then 200, Disp: 15 mL, Rfl: 5   Insulin Pen Needle (NOVOFINE) 32G X 6 MM MISC, Use as directed to inject insulin daily (Patient not taking: Reported on 09/24/2020), Disp: 100 each, Rfl: 2   metFORMIN (GLUCOPHAGE) 1000 MG tablet, Take 1 tablet (1,000 mg total) by mouth 2 (two) times daily with a meal., Disp: 180 tablet, Rfl: 1   montelukast (SINGULAIR) 10 MG tablet, Take 1 tablet (10 mg total) by mouth at bedtime. (Patient not taking: Reported on 09/24/2020), Disp: 90 tablet, Rfl: 1   omeprazole (PRILOSEC) 20 MG capsule, Take 1 capsule (20 mg total) by mouth 2 (two) times daily before a meal., Disp: 30 capsule, Rfl: 1   predniSONE (DELTASONE)  50 MG tablet, Take 1 tablet (50 mg total) by mouth daily with breakfast., Disp: 4 tablet, Rfl: 0   rosuvastatin (CRESTOR) 5 MG tablet, Take 1 tablet (5 mg total) by mouth daily., Disp: 90 tablet, Rfl: 1   sitaGLIPtin (JANUVIA) 100 MG tablet, Take 1 tablet (100 mg total) by mouth daily., Disp: 90 tablet, Rfl: 1   Allergies:  Patient has no known allergies.    Review of Systems:  Gen:  +fatogue  HEENT: Denies blurred vision, double vision, ear pain, eye pain, hearing loss, nose bleeds, sore throat Cardiac:  No dizziness, chest pain or heaviness, chest tightness,edema, No JVD Resp:   +cough, +sputum production, +shortness of breath,+wheezing, -hemoptysis,  Gi: Denies swallowing difficulty, stomach pain, nausea or vomiting, diarrhea, constipation, bowel incontinence Gu:  Denies bladder incontinence, burning urine Ext:   Denies Joint pain, stiffness or swelling Skin: Denies  skin rash, easy bruising or bleeding or hives Endoc:  Denies  polyuria, polydipsia , polyphagia or weight change Psych:   Denies depression, insomnia or hallucinations  Other:  All other systems negative  BP (!) 122/40 (BP Location: Left Arm, Patient Position: Sitting, Cuff Size: Normal)   Pulse 79   Temp 98 F (36.7 C) (Temporal)   Ht 5\' 1"  (1.549 m)   Wt 160 lb 12.8 oz (72.9 kg)   SpO2 99%   BMI 30.38 kg/m     Physical Examination:   General Appearance: No distress  Neuro:without focal findings,  speech normal,  HEENT: PERRLA, EOM intact.   Pulmonary: normal breath sounds, +wheezing.  CardiovascularNormal S1,S2.  No m/r/g.   Abdomen: Benign, Soft, non-tender. Renal:  No costovertebral tenderness  GU:  Not performed at this time. Endoc: No evident thyromegaly Skin:   warm, no rashes, no ecchymosis  Extremities: normal, no cyanosis, clubbing. PSYCHIATRIC: Mood, affect within normal limits.   ALL OTHER ROS ARE NEGATIVE   Assessment and Plan:   75 year old [place] female with chronic severe  persistent asthma Previous office visit assessment was that she can no longer take traditional inhaler therapy and patient was started on nebulized therapy with Pulmicort and Brovana nebulizers  Chronic severe persistent asthma Continue nebulizers as prescribed Continue azithromycin for prevention of exacerbations Overall her prognosis is poor High risk for complications  Dyspnea exertion and shortness of breath related to her obesity underlying lung disease with undiagnosed CAD diastolic heart failure   Patient with probable underlying coronary artery disease Cardiac wheezing could be an etiology from CAD Patient has refused to undergo stress test I have strongly advised patient and daughter to undergo further testing to assess for CAD   Patient needs to be reassessed for exertional hypoxia nocturnal hypoxia We will obtain overnight pulse oximetry on room air Will also need to be assessed for exertional hypoxia with 6-minute walk test    MEDICATION ADJUSTMENTS/LABS AND TESTS ORDERED: Continue nebulized therapy as prescribed Check 6-minute walk test and overnight pulse oximetry Recommend cardiology follow-up and stress test evaluation  CURRENT MEDICATIONS REVIEWED AT LENGTH WITH PATIENT TODAY  TOTAL TIME SPENT 38 mins   Whitney Fleming Patricia Pesa, M.D.  [facility]  Medical Director [place] Director [facility] Cardio-Pulmonary Department

## 2020-11-30 ENCOUNTER — Other Ambulatory Visit: Payer: Self-pay

## 2020-11-30 ENCOUNTER — Encounter: Payer: Self-pay | Admitting: Cardiovascular Disease

## 2020-11-30 ENCOUNTER — Ambulatory Visit: Payer: Medicaid Other | Admitting: Cardiovascular Disease

## 2020-11-30 VITALS — BP 140/50 | HR 79 | Ht 61.0 in | Wt 162.5 lb

## 2020-11-30 DIAGNOSIS — I10 Essential (primary) hypertension: Secondary | ICD-10-CM

## 2020-11-30 DIAGNOSIS — R0600 Dyspnea, unspecified: Secondary | ICD-10-CM | POA: Diagnosis not present

## 2020-11-30 DIAGNOSIS — I2584 Coronary atherosclerosis due to calcified coronary lesion: Secondary | ICD-10-CM

## 2020-11-30 DIAGNOSIS — I700 Atherosclerosis of aorta: Secondary | ICD-10-CM | POA: Diagnosis not present

## 2020-11-30 DIAGNOSIS — E1159 Type 2 diabetes mellitus with other circulatory complications: Secondary | ICD-10-CM

## 2020-11-30 DIAGNOSIS — I2510 Atherosclerotic heart disease of native coronary artery without angina pectoris: Secondary | ICD-10-CM | POA: Diagnosis not present

## 2020-11-30 DIAGNOSIS — K219 Gastro-esophageal reflux disease without esophagitis: Secondary | ICD-10-CM | POA: Diagnosis not present

## 2020-11-30 DIAGNOSIS — R0609 Other forms of dyspnea: Secondary | ICD-10-CM

## 2020-11-30 DIAGNOSIS — J441 Chronic obstructive pulmonary disease with (acute) exacerbation: Secondary | ICD-10-CM

## 2020-11-30 MED ORDER — OMEPRAZOLE 20 MG PO CPDR
20.0000 mg | DELAYED_RELEASE_CAPSULE | Freq: Two times a day (BID) | ORAL | 2 refills | Status: DC
Start: 2020-11-30 — End: 2021-04-27

## 2020-11-30 NOTE — Patient Instructions (Signed)
Medication Instructions:  No changes  If you need a refill on your cardiac medications before your next appointment, please call your pharmacy.    Lab work: No new labs needed   If you have labs (blood work) drawn today and your tests are completely normal, you will receive your results only by: . MyChart Message (if you have MyChart) OR . A paper copy in the mail If you have any lab test that is abnormal or we need to change your treatment, we will call you to review the results.   Testing/Procedures: No new testing needed   Follow-Up: At [facility], you and your health needs are our priority.  As part of our continuing mission to provide you with exceptional heart care, we have created designated Provider Care Teams.  These Care Teams include your primary Cardiologist (physician) and Advanced Practice Providers (APPs -  Physician Assistants and Nurse Practitioners) who all work together to provide you with the care you need, when you need it.  . You will need a follow up appointment in 12 months  . Providers on your designated Care Team:   . Christopher Berge, NP . Ryan Dunn, PA-C . Jacquelyn Visser, PA-C  Any Other Special Instructions Will Be Listed Below (If Applicable).  COVID-19 Vaccine Information can be found at: [email or web address] For questions related to vaccine distribution or appointments, please email [email or web address] or call [phone].     

## 2020-11-30 NOTE — Progress Notes (Signed)
Cardiology Office Note  Date:  11/30/2020   ID:  Maimuna Leaman, DOB 1946-01-17, MRN [id number]  PCP:  [name], MD   Chief Complaint  Patient presents with   12 month follow up     "Doing well." Medications reviewed by the patient verbally.     HPI:  Ms.  Whitney Fleming is a 75 year old woman Referred by Dr.Bacigalupo for shortness of breath on exertion History of Morbid obesity sedentary lifestyle Diabetes type 2, hyperlipidemia There is significant coronary calcification on CT scan chest 2019 Significant aortic atherosclerosis Who presents for follow-up of her coronary disease, shortness of breath  Daughter translates for her today Seen last year, stress test ordered at that time for shortness of breath She declined to schedule, Has had follow-up with pulmonary, multiple asthma exacerbations, recently finished prednisone, sugars running high Is not on proton pump inhibitor  Prior echocardiogram 2019 normal study  CT chest pulled up from 2019, significant coronary calcification, aortic atherosclerosis  Cholesterol at goal Very inactive, weight trending upwards, gets short of breath even trying to put her shoes on Does not go outside the house, feels her asthma will get bad Daughter cannot get her to go walking apart from in the kitchen  CT scan 2019 images pulled up and reviewed in detail This shows moderate diffuse aortic atherosclerosis, significant coronary calcification/possibly left main versus proximal LAD  EKG personally reviewed by myself on todays visit Normal sinus rhythm rate 79 bpm no significant ST or T wave changes  Echo 12/2019  1. Left ventricular ejection fraction, by estimation, is 55 to 60%. The  left ventricle has normal function. The left ventricle has no regional  wall motion abnormalities. Left ventricular diastolic parameters are  consistent with Grade I diastolic  dysfunction (impaired relaxation).   2. Right ventricular systolic  function is normal. The right ventricular  size is normal.   3. The mitral valve is normal in structure. No evidence of mitral valve  regurgitation.   4. The aortic valve is grossly normal. Aortic valve regurgitation is not  visualized. Mild aortic valve stenosis.    PMH:   has a past medical history of Anemia, Asthma, Cardiomegaly, COPD (chronic obstructive pulmonary disease) ([place]), Diabetes mellitus without complication ([place]), Hypertension, and Hyponatremia.  PSH:    Past Surgical History:  Procedure Laterality Date   TUBAL LIGATION      Current Outpatient Medications  Medication Sig Dispense Refill   Accu-Chek FastClix Lancets MISC Use as directed to check blood glucose twice daily 100 each 1   albuterol (PROVENTIL) (2.5 MG/3ML) 0.083% nebulizer solution Take 3 mLs (2.5 mg total) by nebulization every 6 (six) hours as needed for wheezing or shortness of breath. 1008 mL 1   albuterol (VENTOLIN HFA) 108 (90 Base) MCG/ACT inhaler Inhale 2 puffs into the lungs every 6 (six) hours as needed for wheezing or shortness of breath. 8 g 2   AMBULATORY NON FORMULARY MEDICATION Medication Name: Aero chamber use as directed. DX:J45.909 1 each 0   amLODipine (NORVASC) 5 MG tablet Take 5 mg by mouth daily.     aspirin EC 81 MG tablet Take 1 tablet (81 mg total) by mouth daily. Swallow whole. 90 tablet 3   azithromycin (ZITHROMAX) 250 MG tablet TAKE 1 TABLET BY MOUTH DAILY ON MONDAY, WEDNESDAY, AND FRIDAY 13 tablet 3   budesonide-formoterol (SYMBICORT) 160-4.5 MCG/ACT inhaler Inhale 2 puffs into the lungs 2 (two) times daily. Rinse mouth after use 3 each 3   carvedilol (  COREG) 12.5 MG tablet Take 1 tablet (12.5 mg total) by mouth 2 (two) times daily with a meal. 180 tablet 1   Continuous Blood Gluc Receiver (FREESTYLE LIBRE 14 DAY READER) DEVI Use to test blood glucose throughout the day, continuously 1 each 5   Continuous Blood Gluc Sensor (FREESTYLE LIBRE 14 DAY SENSOR) MISC Use for 14 days to  monitor glucose continuously 6 each 3   empagliflozin (JARDIANCE) 25 MG TABS tablet Take 1 tablet (25 mg total) by mouth daily. 90 tablet 1   glucose blood (ACCU-CHEK GUIDE) test strip USE AS DIRECTED TWICE DAILY 50 each 1   guaiFENesin (MUCINEX) 600 MG 12 hr tablet Take 1 tablet (600 mg total) by mouth 2 (two) times daily. 60 tablet 2   insulin glargine (LANTUS SOLOSTAR) 100 UNIT/ML Solostar Pen Inject 20 Units into the skin daily. Decrease to 10 units if sugars less then 200 15 mL 5   Insulin Pen Needle (NOVOFINE) 32G X 6 MM MISC Use as directed to inject insulin daily 100 each 2   metFORMIN (GLUCOPHAGE) 1000 MG tablet Take 1 tablet (1,000 mg total) by mouth 2 (two) times daily with a meal. 180 tablet 1   montelukast (SINGULAIR) 10 MG tablet Take 1 tablet (10 mg total) by mouth at bedtime. 90 tablet 1   rosuvastatin (CRESTOR) 5 MG tablet Take 1 tablet (5 mg total) by mouth daily. 90 tablet 1   sitaGLIPtin (JANUVIA) 100 MG tablet Take 1 tablet (100 mg total) by mouth daily. 90 tablet 1   omeprazole (PRILOSEC) 20 MG capsule Take 1 capsule (20 mg total) by mouth 2 (two) times daily before a meal. 180 capsule 2   No current facility-administered medications for this visit.     Allergies:   Patient has no known allergies.   Social History:  The patient  reports that she has never smoked. She has never used smokeless tobacco. She reports that she does not drink alcohol and does not use drugs.   Family History:   family history includes Asthma in her father; Healthy in her mother; Heart disease in her father; Tuberculosis in an other family member.    Review of Systems: Review of Systems  Constitutional: Negative.   HENT: Negative.    Respiratory: Negative.    Cardiovascular: Negative.   Gastrointestinal: Negative.   Musculoskeletal: Negative.   Neurological: Negative.   Psychiatric/Behavioral: Negative.    All other systems reviewed and are negative.  PHYSICAL EXAM: VS:  BP (!) 140/50  (BP Location: Left Arm, Patient Position: Sitting, Cuff Size: Normal)   Pulse 79   Ht 5\' 1"  (1.549 m)   Wt 162 lb 8 oz (73.7 kg)   SpO2 98%   BMI 30.70 kg/m  , BMI Body mass index is 30.7 kg/m. GEN: Well nourished, well developed, in no acute distress HEENT: normal Neck: no JVD, carotid bruits, or masses Cardiac: RRR; no murmurs, rubs, or gallops,no edema  Respiratory:  clear to auscultation bilaterally, normal work of breathing GI: soft, nontender, nondistended, + BS MS: no deformity or atrophy Skin: warm and dry, no rash Neuro:  Strength and sensation are intact Psych: euthymic mood, full affect  Recent Labs: 09/24/2020: B Natriuretic Peptide 171.1 09/27/2020: Magnesium 2.0 11/21/2020: ALT 15; BUN 21; Creatinine, Ser 1.10; Hemoglobin 12.6; Platelets 283; Potassium 5.0; Sodium 132    Lipid Panel Lab Results  Component Value Date   CHOL 127 04/28/2020   HDL 49 04/28/2020   LDLCALC 54 04/28/2020  TRIG 138 04/28/2020      Wt Readings from Last 3 Encounters:  11/30/20 162 lb 8 oz (73.7 kg)  11/29/20 160 lb 12.8 oz (72.9 kg)  11/21/20 154 lb (69.9 kg)     ASSESSMENT AND PLAN:  Problem List Items Addressed This Visit       Cardiology Problems   HTN (hypertension)   Relevant Orders   EKG 12-Lead     Other   GERD (gastroesophageal reflux disease)   Relevant Medications   omeprazole (PRILOSEC) 20 MG capsule   Dyspnea on exertion   COPD exacerbation (HCC) - Primary   Relevant Orders   EKG 12-Lead   Diabetes ([place])   Other Visit Diagnoses     Coronary artery calcification       Aortic atherosclerosis (HCC)         Shortness of breath Long discussion, likely multifactorial Previously declined a stress testing last year Again discussed stress testing or cardiac catheterization She denies any chest pain, very sedentary, only walking in the kitchen -Suspect very deconditioned, obesity playing a role -Discussed with daughter, she will call us for any concerning  symptoms for angina Would likely not perform stress test and proceed directly to cardiac catheterization, would be high risk of attenuation artifact given body habitus -Recommend she take her omeprazole which she is not taking Lots of clearing her throat at night, coughing  PAD Significant aortic atherosclerosis on CT scan 2019,  Stressed importance of aggressive diabetes   aspirin 81 mg daily Cholesterol at goal  Coronary calcification Heavy calcification noted, stressed importance of better diabetes control, walking program, weight loss  Aortic atherosclerosis Significant plaque noted on CT scan Non-smoker, lipids at goal Need to work on diabetes, this was discussed with daughter  Diabetes type 2 with complications Recommended strict low carbohydrate diet, walking program, lifestyle modification    Total encounter time more than 35 minutes  Greater than 50% was spent in counseling and coordination of care with the patient      Signed, Esmond Plants, M.D., Ph.D. [place], [place]

## 2020-11-30 NOTE — Progress Notes (Signed)
Stress cancel, pt refused, Dr. Rockey Situ spoke with pt today during office visit, okay to cancel.

## 2020-12-06 DIAGNOSIS — R3981 Functional urinary incontinence: Secondary | ICD-10-CM | POA: Diagnosis not present

## 2020-12-08 ENCOUNTER — Ambulatory Visit: Payer: Self-pay | Admitting: Family Medicine

## 2020-12-15 ENCOUNTER — Telehealth: Payer: Self-pay | Admitting: Internal Medicine

## 2020-12-15 NOTE — Telephone Encounter (Signed)
ONO reviewed by Dr. Boykin Peek oxygen needed.   Patient's daughter, [name]) is aware of results and voiced her understanding.  Nothing further needed

## 2020-12-20 ENCOUNTER — Other Ambulatory Visit: Payer: Medicaid Other

## 2021-01-12 DIAGNOSIS — R3981 Functional urinary incontinence: Secondary | ICD-10-CM | POA: Diagnosis not present

## 2021-01-16 ENCOUNTER — Other Ambulatory Visit: Payer: Self-pay | Admitting: Family Medicine

## 2021-01-16 ENCOUNTER — Emergency Department
Admission: EM | Admit: 2021-01-16 | Discharge: 2021-01-16 | Disposition: A | Discharge status: Home / Self Care | Payer: Medicaid Other | Attending: Emergency Medicine | Admitting: Emergency Medicine

## 2021-01-16 ENCOUNTER — Emergency Department: Payer: Medicaid Other

## 2021-01-16 ENCOUNTER — Other Ambulatory Visit: Payer: Self-pay

## 2021-01-16 DIAGNOSIS — J45909 Unspecified asthma, uncomplicated: Secondary | ICD-10-CM | POA: Insufficient documentation

## 2021-01-16 DIAGNOSIS — Z20822 Contact with and (suspected) exposure to covid-19: Secondary | ICD-10-CM | POA: Insufficient documentation

## 2021-01-16 DIAGNOSIS — Z794 Long term (current) use of insulin: Secondary | ICD-10-CM | POA: Insufficient documentation

## 2021-01-16 DIAGNOSIS — I10 Essential (primary) hypertension: Secondary | ICD-10-CM | POA: Insufficient documentation

## 2021-01-16 DIAGNOSIS — E1165 Type 2 diabetes mellitus with hyperglycemia: Secondary | ICD-10-CM | POA: Insufficient documentation

## 2021-01-16 DIAGNOSIS — J441 Chronic obstructive pulmonary disease with (acute) exacerbation: Secondary | ICD-10-CM | POA: Diagnosis not present

## 2021-01-16 DIAGNOSIS — Z79899 Other long term (current) drug therapy: Secondary | ICD-10-CM | POA: Diagnosis not present

## 2021-01-16 DIAGNOSIS — Z7982 Long term (current) use of aspirin: Secondary | ICD-10-CM | POA: Insufficient documentation

## 2021-01-16 DIAGNOSIS — E875 Hyperkalemia: Secondary | ICD-10-CM | POA: Diagnosis not present

## 2021-01-16 DIAGNOSIS — R0602 Shortness of breath: Secondary | ICD-10-CM | POA: Diagnosis not present

## 2021-01-16 DIAGNOSIS — R739 Hyperglycemia, unspecified: Secondary | ICD-10-CM

## 2021-01-16 LAB — BASIC METABOLIC PANEL
Anion gap: 7 (ref 5–15)
BUN: 21 mg/dL (ref 8–23)
CO2: 24 mmol/L (ref 22–32)
Calcium: 8.9 mg/dL (ref 8.9–10.3)
Chloride: 100 mmol/L (ref 98–111)
Creatinine, Ser: 1.14 mg/dL — ABNORMAL HIGH (ref 0.44–1.00)
GFR, Estimated: 50 mL/min — ABNORMAL LOW (ref >60)
Glucose, Bld: 296 mg/dL — ABNORMAL HIGH (ref 70–99)
Potassium: 5.4 mmol/L — ABNORMAL HIGH (ref 3.5–5.1)
Sodium: 131 mmol/L — ABNORMAL LOW (ref 135–145)

## 2021-01-16 LAB — RESP PANEL BY RT-PCR (FLU A&B, COVID) ARPGX2
Influenza A by PCR: NEGATIVE
Influenza B by PCR: NEGATIVE
SARS Coronavirus 2 by RT PCR: NEGATIVE

## 2021-01-16 LAB — CBC
HCT: 42.2 % (ref 36.0–46.0)
Hemoglobin: 12.9 g/dL (ref 12.0–15.0)
MCH: 23.7 pg — ABNORMAL LOW (ref 26.0–34.0)
MCHC: 30.6 g/dL (ref 30.0–36.0)
MCV: 77.4 fL — ABNORMAL LOW (ref 80.0–100.0)
Platelets: 265 10*3/uL (ref 150–400)
RBC: 5.45 MIL/uL — ABNORMAL HIGH (ref 3.87–5.11)
RDW: 15.6 % — ABNORMAL HIGH (ref 11.5–15.5)
WBC: 10.9 10*3/uL — ABNORMAL HIGH (ref 4.0–10.5)
nRBC: 0 % (ref 0.0–0.2)

## 2021-01-16 LAB — D-DIMER, QUANTITATIVE: D-Dimer, Quant: 0.63 ug{FEU}/mL — ABNORMAL HIGH (ref 0.00–0.50)

## 2021-01-16 LAB — HEMOGLOBIN A1C
Hgb A1c MFr Bld: 10.6 % — ABNORMAL HIGH (ref 4.8–5.6)
Mean Plasma Glucose: 257.52 mg/dL

## 2021-01-16 LAB — PROCALCITONIN: Procalcitonin: <0.1 ng/mL

## 2021-01-16 LAB — TROPONIN I (HIGH SENSITIVITY)
Troponin I (High Sensitivity): 7 ng/L (ref <18)
Troponin I (High Sensitivity): 6 ng/L

## 2021-01-16 LAB — CBG MONITORING, ED: Glucose-Capillary: 245 mg/dL — ABNORMAL HIGH (ref 70–99)

## 2021-01-16 LAB — MAGNESIUM: Magnesium: 1.7 mg/dL (ref 1.7–2.4)

## 2021-01-16 MED ORDER — METHYLPREDNISOLONE SODIUM SUCC 125 MG IJ SOLR
125.0000 mg | Freq: Once | INTRAMUSCULAR | Status: AC
  Administered 2021-01-16: 125 mg via INTRAVENOUS
  Filled 2021-01-16: qty 2

## 2021-01-16 MED ORDER — IPRATROPIUM-ALBUTEROL 0.5-2.5 (3) MG/3ML IN SOLN
9.0000 mL | Freq: Once | RESPIRATORY_TRACT | Status: AC
  Administered 2021-01-16: 9 mL via RESPIRATORY_TRACT
  Filled 2021-01-16: qty 3
  Filled 2021-01-16: qty 9

## 2021-01-16 MED ORDER — IPRATROPIUM-ALBUTEROL 0.5-2.5 (3) MG/3ML IN SOLN
3.0000 mL | Freq: Once | RESPIRATORY_TRACT | Status: AC
  Administered 2021-01-16: 3 mL via RESPIRATORY_TRACT
  Filled 2021-01-16: qty 3

## 2021-01-16 MED ORDER — INSULIN ASPART 100 UNIT/ML IJ SOLN: 0.0000 [IU] | INTRAMUSCULAR | Status: DC

## 2021-01-16 NOTE — ED Triage Notes (Addendum)
Pt to ER via POV with complaints of shortness of breath and a productive cough. Reports being unable to perform usual tasks/ be independent due to shortness of breath with exertion. Shortness of breath has progressed over the week. Reports a burning sensation in her chest when she coughs. Reports travel approx 1 month ago to new [place]. Denies recent illness. Unknown covid contacts.   Still audibly wheezing after duoneb administration.

## 2021-01-16 NOTE — ED Notes (Signed)
Pt to ER via POV with c/o SHOB.  Pt with noted expiratory wheezes, duo neb administered at this time.  RA O2 sats 94%.  Pt to triage when room available.

## 2021-01-16 NOTE — ED Provider Notes (Signed)
[facility] Emergency Department Provider Note  ____________________________________________   Event Date/Time   First MD Initiated Contact with Patient 01/16/21 1248     (approximate)  I have reviewed the triage vital signs and the nursing notes.   HISTORY  Chief Complaint Shortness of Breath   HPI Whitney Fleming is a 75 y.o. female with past medical history of HTN, DM, COPD, asthma, anemia and cardiomegaly who presents for assessment of approximately 1 week of some shortness of breath associate with cough worsened by exertion.  Patient denies any fevers, chest pain, headache, earache, sore throat, vomiting, diarrhea, dysuria, rash, urinary symptoms or recent falls or injuries.  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  No other acute concerns at this time         Past Medical History:  Diagnosis Date   Anemia    normocytic anemia   Asthma    Cardiomegaly    COPD (chronic obstructive pulmonary disease) ([place])    Diabetes mellitus without complication ([place])    Hypertension    Hyponatremia     Patient Active Problem List   Diagnosis Date Noted   COPD exacerbation ([place]) 09/24/2020   Hyperkalemia 09/24/2020   Dyspnea on exertion 11/18/2019   Asthma exacerbation 08/18/2019   COPD (chronic obstructive pulmonary disease) ([place]) 03/03/2018   Hyperlipidemia associated with type 2 diabetes mellitus ([place]) 12/20/2017   GERD (gastroesophageal reflux disease) 12/20/2017   Respiratory failure ([place]) 12/02/2017   Asthma 09/26/2017   HTN (hypertension) 09/26/2017   Diabetes ([place]) 09/26/2017    Past Surgical History:  Procedure Laterality Date   TUBAL LIGATION      Prior to Admission medications   Medication Sig Start Date End Date Taking? Authorizing Provider  Accu-Chek FastClix Lancets MISC Use as directed to check blood glucose twice daily 06/27/20   [name], MD  albuterol (PROVENTIL)  (2.5 MG/3ML) 0.083% nebulizer solution Take 3 mLs (2.5 mg total) by nebulization every 6 (six) hours as needed for wheezing or shortness of breath. 11/29/20   Flora Lipps, MD  albuterol (VENTOLIN HFA) 108 (90 Base) MCG/ACT inhaler Inhale 2 puffs into the lungs every 6 (six) hours as needed for wheezing or shortness of breath. 09/26/20   Val Riles, MD  AMBULATORY NON FORMULARY MEDICATION Medication Name: Aero chamber use as directed. [id number] 12/24/17   [name], MD  amLODipine (NORVASC) 5 MG tablet Take 5 mg by mouth daily.    [provider]  aspirin EC 81 MG tablet Take 1 tablet (81 mg total) by mouth daily. Swallow whole. 01/08/20   Minna Merritts, MD  azithromycin (ZITHROMAX) 250 MG tablet TAKE 1 TABLET BY MOUTH DAILY ON MONDAY, WEDNESDAY, AND FRIDAY 11/01/20   Chrismon, Vickki Muff, PA-C  budesonide-formoterol (SYMBICORT) 160-4.5 MCG/ACT inhaler Inhale 2 puffs into the lungs 2 (two) times daily. Rinse mouth after use 09/19/20   Flora Lipps, MD  carvedilol (COREG) 12.5 MG tablet Take 1 tablet (12.5 mg total) by mouth 2 (two) times daily with a meal. 06/27/20   Bacigalupo, Dionne Bucy, MD  Continuous Blood Gluc Receiver (FREESTYLE LIBRE 14 DAY READER) DEVI Use to test blood glucose throughout the day, continuously 06/27/20   Flinchum, Kelby Aline, FNP  Continuous Blood Gluc Sensor (FREESTYLE LIBRE 14 DAY SENSOR) MISC Use for 14 days to monitor glucose continuously 06/27/20   Flinchum, Kelby Aline, FNP  empagliflozin (JARDIANCE) 25 MG TABS tablet Take 1 tablet (  25 mg total) by mouth daily. 06/27/20   [name], MD  glucose blood (ACCU-CHEK GUIDE) test strip USE AS DIRECTED TWICE DAILY 06/27/20   Bacigalupo, Dionne Bucy, MD  guaiFENesin (MUCINEX) 600 MG 12 hr tablet Take 1 tablet (600 mg total) by mouth 2 (two) times daily. 11/21/20 11/21/21  Lavonia Drafts, MD  insulin glargine (LANTUS SOLOSTAR) 100 UNIT/ML Solostar Pen Inject 20 Units into the skin daily. Decrease to 10 units if  sugars less then 200 09/27/20   Fritzi Mandes, MD  Insulin Pen Needle (NOVOFINE) 32G X 6 MM MISC Use as directed to inject insulin daily 11/18/19   [name], MD  metFORMIN (GLUCOPHAGE) 1000 MG tablet Take 1 tablet (1,000 mg total) by mouth 2 (two) times daily with a meal. 06/27/20   Bacigalupo, Dionne Bucy, MD  montelukast (SINGULAIR) 10 MG tablet Take 1 tablet (10 mg total) by mouth at bedtime. 06/27/20   [name], MD  omeprazole (PRILOSEC) 20 MG capsule Take 1 capsule (20 mg total) by mouth 2 (two) times daily before a meal. 11/30/20   Gollan, Kathlene November, MD  rosuvastatin (CRESTOR) 5 MG tablet Take 1 tablet (5 mg total) by mouth daily. 09/27/20   Fritzi Mandes, MD  sitaGLIPtin (JANUVIA) 100 MG tablet Take 1 tablet (100 mg total) by mouth daily. 06/27/20   [name], MD    Allergies Patient has no known allergies.  Family History  Problem Relation Age of Onset   Tuberculosis Other    Healthy Mother    Heart disease Father    Asthma Father    Cancer Neg Hx     Social History Social History   Tobacco Use   Smoking status: Never   Smokeless tobacco: Never  Vaping Use   Vaping Use: Never used  Substance Use Topics   Alcohol use: Never   Drug use: Never    Review of Systems  Review of Systems  Constitutional:  Negative for chills and fever.  HENT:  Negative for sore throat.   Eyes:  Negative for pain.  Respiratory:  Positive for cough and shortness of breath. Negative for stridor.   Cardiovascular:  Negative for chest pain.  Gastrointestinal:  Negative for vomiting.  Genitourinary:  Negative for dysuria.  Musculoskeletal:  Negative for myalgias.  Skin:  Negative for rash.  Neurological:  Negative for seizures, loss of consciousness and headaches.  Psychiatric/Behavioral:  Negative for suicidal ideas.   All other systems reviewed and are negative.    ____________________________________________   PHYSICAL EXAM:  VITAL SIGNS: ED Triage Vitals  [01/16/21 1246]  Enc Vitals Group     BP (!) 122/52     Pulse Rate 91     Resp (!) 22     Temp 98 F (36.7 C)     Temp Source Oral     SpO2 96 %     Weight      Height '5\' 1"'$  (1.549 m)     Head Circumference      Peak Flow      Pain Score 3     Pain Loc      Pain Edu?      Excl. in [place]?    Vitals:   01/16/21 1400 01/16/21 1430  BP: 118/78 (!) 133/53  Pulse:    Resp: (!) 22 20  Temp:    SpO2:     Physical Exam Vitals and nursing note reviewed.  Constitutional:      General:  She is not in acute distress.    Appearance: She is well-developed.  HENT:     Head: Normocephalic and atraumatic.     Right Ear: External ear normal.     Left Ear: External ear normal.     Nose: Nose normal.  Eyes:     Conjunctiva/sclera: Conjunctivae normal.  Cardiovascular:     Rate and Rhythm: Normal rate and regular rhythm.     Heart sounds: No murmur heard. Pulmonary:     Effort: Tachypnea and respiratory distress present.     Breath sounds: Decreased breath sounds and wheezing present.  Abdominal:     Palpations: Abdomen is soft.     Tenderness: There is no abdominal tenderness.  Musculoskeletal:     Cervical back: Neck supple.  Skin:    General: Skin is warm and dry.     Capillary Refill: Capillary refill takes less than 2 seconds.  Neurological:     Mental Status: She is alert and oriented to person, place, and time.  Psychiatric:        Mood and Affect: Mood normal.     ____________________________________________   LABS (all labs ordered are listed, but only abnormal results are displayed)  Labs Reviewed  BASIC METABOLIC PANEL - Abnormal; Notable for the following components:      Result Value   Sodium 131 (*)    Potassium 5.4 (*)    Glucose, Bld 296 (*)    Creatinine, Ser 1.14 (*)    GFR, Estimated 50 (*)    All other components within normal limits  CBC - Abnormal; Notable for the following components:   WBC 10.9 (*)    RBC 5.45 (*)    MCV 77.4 (*)    MCH 23.7  (*)    RDW 15.6 (*)    All other components within normal limits  D-DIMER, QUANTITATIVE (NOT AT [facility]) - Abnormal; Notable for the following components:   D-Dimer, Quant 0.63 (*)    All other components within normal limits  CBG MONITORING, ED - Abnormal; Notable for the following components:   Glucose-Capillary 245 (*)    All other components within normal limits  RESP PANEL BY RT-PCR (FLU A&B, COVID) [id number]  PROCALCITONIN  MAGNESIUM  HEMOGLOBIN A1C  TROPONIN I (HIGH SENSITIVITY)   ____________________________________________  EKG  Sinus rhythm with a ventricular rate of 94, left axis deviation, unremarkable intervals without clearance of acute ischemia or significant arrhythmia. ____________________________________________  RADIOLOGY  ED MD interpretation: Chest x-ray has no focal consolidation, overt edema, effusion, pneumothorax or other clear acute intrathoracic process per  Official radiology report(s): DG Chest 2 View  Result Date: 01/16/2021 CLINICAL DATA:  Shortness of breath. EXAM: CHEST - 2 VIEW COMPARISON:  November 21, 2020. FINDINGS: The heart size and mediastinal contours are within normal limits. Both lungs are clear. The visualized skeletal structures are unremarkable. IMPRESSION: No active cardiopulmonary disease. Electronically Signed   By: Marijo Conception M.D.   On: 01/16/2021 13:36    ____________________________________________   PROCEDURES  Procedure(s) performed (including Critical Care):  Procedures   ____________________________________________   INITIAL IMPRESSION / ASSESSMENT AND PLAN / ED COURSE      Patient presents with above-stated history exam for assessment of several days of progressive cough and shortness of breath.  Patient is tachypneic with otherwise stable vital signs on arrival.  She was given a DuoNeb in triage for audible wheezing.  On my initial assessment she was wheezing and tachypneic with bilateral diminished breath  sounds  wheezes throughout the lungs.  He has some accessory muscle use.  Abdomen is soft nontender and she has not hypoxic.  Differential includes acute exacerbation of obstructive airway disease, pneumonia, PE, ACS, arrhythmia, anemia, metabolic derangements heart failure.  Chest x-ray is no volume overload evident and given patient is not appear volume overload on exam Evalose patient for heart failure.  She has no fever or focal consolidation on chest x-ray to suggest bacterial pneumonia.  Chest x-ray is otherwise unremarkable.  CBC shows WBC count of 10.9 but no acute anemia.  BMP remarkable for K of 5.4, glucose of 296 without evidence of acidosis or any other significant electrolyte or metabolic derangements.  Patient given sliding-scale insulin.  Magnesium is WNL.  ECG and nonelevated troponin not consistent with ACS or myocarditis.  Given concern for possible obstructive airway exacerbation patient given duo nebs and Solu-Medrol.  Will obtain a D-dimer to assess risk for PE.  If this is elevated we will plan to obtain a CTA chest.  Will Also swab for COVID and influenza.  D-dimer 0.68.  I have low suspicion for PE as this is within age-adjusted limits of 760.  COVID and influenza PCR is negative.  Following DuoNeb therapy and Solu-Medrol patient was ambulated and had no evidence of hypoxia or significant tachypnea or shortness of breath during trial ventilation.  She states she is feeling much better.  Suspect mild COPD exacerbation.  Low suspicion for acute infectious process at this time.  She states she has adequate inhalers at home.  Given hyperglycemia will defer steroids outpatient at this time.  Advised her hyperglycemia seen on labs today and recommendation to follow-up with her PCP in 2 or 3 days to have this rechecked.  Given patient is feeling much better now with stable vitals otherwise reassuring exam work-up I think she is stable for discharge with outpatient follow-up.  Discharged stable  condition.  Strict return cautions advised and discussed.     ____________________________________________   FINAL CLINICAL IMPRESSION(S) / ED DIAGNOSES  Final diagnoses:  COPD exacerbation ([place])  Hyperglycemia    Medications  insulin aspart (novoLOG) injection 0-15 Units (has no administration in time range)  ipratropium-albuterol (DUONEB) 0.5-2.5 (3) MG/3ML nebulizer solution 3 mL (3 mLs Nebulization Given 01/16/21 1230)  ipratropium-albuterol (DUONEB) 0.5-2.5 (3) MG/3ML nebulizer solution 9 mL (9 mLs Nebulization Given 01/16/21 1339)  methylPREDNISolone sodium succinate (SOLU-MEDROL) 125 mg/2 mL injection 125 mg (125 mg Intravenous Given 01/16/21 1324)     ED Discharge Orders     None        Note:  This document was prepared using Dragon voice recognition software and may include unintentional dictation errors.    Lucrezia Starch, MD 01/16/21 [phone]

## 2021-01-16 NOTE — Telephone Encounter (Signed)
Requested medication (s) are due for refill today:   Yes  Requested medication (s) are on the active medication list:   Yes  Future visit scheduled:   No   Last ordered: 06/27/2020 #90, 1 refill  Returned because protocol failed.   Labs due.     Requested Prescriptions  Pending Prescriptions Disp Refills   JANUVIA 100 MG tablet [Pharmacy Med Name: Januvia 100 MG Oral Tablet] 90 tablet 0    Sig: Take 1 tablet by mouth once daily      Endocrinology:  Diabetes - DPP-4 Inhibitors Failed - 01/16/2021  5:31 AM      Failed - HBA1C is between 0 and 7.9 and within 180 days    Hemoglobin A1C  Date Value Ref Range Status  08/02/2020 10.4 (A) 4.0 - 5.6 % Final  12/25/2012 7.9 (H) 4.2 - 6.3 % Final    Comment:    The American Diabetes Association recommends that a primary goal of therapy should be <7% and that physicians should reevaluate the treatment regimen in patients with HbA1c values consistently >8%.    Hgb A1c MFr Bld  Date Value Ref Range Status  04/28/2020 10.4 (H) 4.8 - 5.6 % Final    Comment:             Prediabetes: 5.7 - 6.4          Diabetes: >6.4          Glycemic control for adults with diabetes: <7.0           Failed - Cr in normal range and within 360 days    Creatinine  Date Value Ref Range Status  12/26/2012 1.16 0.60 - 1.30 mg/dL Final   Creatinine, Ser  Date Value Ref Range Status  11/21/2020 1.10 (H) 0.44 - 1.00 mg/dL Final          Passed - Valid encounter within last 6 months    Recent Outpatient Visits           2 months ago COPD exacerbation [facility])   [place], Vickki Muff, PA-C   5 months ago Type 2 diabetes mellitus with hyperglycemia, without long-term current use of insulin [facility]   [facility] [place], Dionne Bucy, MD   8 months ago Encounter for annual physical exam   [facility] [place], Dionne Bucy, MD   1 year ago Dyspnea on exertion   [facility] [place], Dionne Bucy,  MD   1 year ago Severe persistent asthma without complication   [facility], Dionne Bucy, MD

## 2021-01-17 ENCOUNTER — Telehealth: Payer: Self-pay

## 2021-01-17 ENCOUNTER — Telehealth: Payer: Self-pay | Admitting: Internal Medicine

## 2021-01-17 NOTE — Telephone Encounter (Signed)
Noted.   LMTCB

## 2021-01-17 NOTE — Telephone Encounter (Signed)
With my schedule as it is for the rest of August is very with either ICU time or PAL.  We may have slots in the APP schedules.  We will try that.  Not sure when Dr. Mortimer Fries is back on schedule.  Otherwise would have to be next available.

## 2021-01-17 NOTE — Telephone Encounter (Signed)
Transition Care Management Follow-up Telephone Call Date of discharge and from where: 01/16/2021 from [facility] How have you been since you were released from the hospital? Spoke to pt dtr and pt is doing better.  Any questions or concerns? No  Items Reviewed: Did the pt receive and understand the discharge instructions provided? Yes  Medications obtained and verified? Yes  Other? No  Any new allergies since your discharge? No  Dietary orders reviewed? No Do you have support at home? Yes   Functional Questionnaire: (I = Independent and D = Dependent) ADLs: D  Bathing/Dressing- D  Meal Prep- D  Eating- D  Maintaining continence- D  Transferring/Ambulation- D  Managing Meds- D   Follow up appointments reviewed:  PCP Hospital f/u appt confirmed? No  Pt dtr will call PCP and Pulmonologist for an appt. [facility] f/u appt confirmed? No   Are transportation arrangements needed? No  If their condition worsens, is the pt aware to call PCP or go to the Emergency Dept.? Yes Was the patient provided with contact information for the PCP's office or ED? Yes Was to pt encouraged to call back with questions or concerns? Yes

## 2021-01-17 NOTE — Telephone Encounter (Signed)
Call made to patient, confirmed DOB. Went to ED for COPD exacerbation, reports she is doing much better now. They are calling to make a hospital f/u appt. Patient denies any respiratory distress, states I am doing fine I just needed to make a f/u appt.   There are no open appt until end September and these are with CG.   KK and CG please advise if okay to have patient see CG for hospital f/u and if so can she be squeezed in, nothing until end of September. Thanks :)

## 2021-01-18 NOTE — Telephone Encounter (Signed)
Patient has been scheduled for OV. Nothing further needed at this time.  Next Appt With Pulmonology (Tammy Parrett, NP)01/23/2021 at 10:00 AM

## 2021-01-23 ENCOUNTER — Inpatient Hospital Stay: Payer: Medicaid Other | Admitting: Adult Health

## 2021-02-18 ENCOUNTER — Other Ambulatory Visit: Payer: Self-pay | Admitting: Family Medicine

## 2021-02-18 NOTE — Telephone Encounter (Signed)
Requested medication (s) are due for refill today: yes  Requested medication (s) are on the active medication list: yes  Last refill:  01/18/21 #30  Future visit scheduled: no  Notes to clinic:  Using Natalia- agent number: [phone] LM on VM to call [facility]- phone number provided   Requested Prescriptions  Pending Prescriptions Disp Refills   JANUVIA 100 MG tablet [Pharmacy Med Name: Januvia 100 MG Oral Tablet] 30 tablet 0    Sig: TAKE 1 TABLET BY MOUTH ONCE DAILY . APPOINTMENT REQUIRED FOR FUTURE REFILLS     Endocrinology:  Diabetes - DPP-4 Inhibitors Failed - 02/18/2021 11:16 AM      Failed - HBA1C is between 0 and 7.9 and within 180 days    Hemoglobin A1C  Date Value Ref Range Status  12/25/2012 7.9 (H) 4.2 - 6.3 % Final    Comment:    The American Diabetes Association recommends that a primary goal of therapy should be <7% and that physicians should reevaluate the treatment regimen in patients with HbA1c values consistently >8%.    Hgb A1c MFr Bld  Date Value Ref Range Status  01/16/2021 10.6 (H) 4.8 - 5.6 % Final    Comment:    (NOTE) Pre diabetes:          5.7%-6.4%  Diabetes:              >6.4%  Glycemic control for   <7.0% adults with diabetes           Failed - Cr in normal range and within 360 days    Creatinine  Date Value Ref Range Status  12/26/2012 1.16 0.60 - 1.30 mg/dL Final   Creatinine, Ser  Date Value Ref Range Status  01/16/2021 1.14 (H) 0.44 - 1.00 mg/dL Final          Passed - Valid encounter within last 6 months    Recent Outpatient Visits           3 months ago COPD exacerbation [facility])   [place], Vickki Muff, PA-C   6 months ago Type 2 diabetes mellitus with hyperglycemia, without long-term current use of insulin [facility])   [facility] [place], Dionne Bucy, MD   9 months ago Encounter for annual physical exam   [facility] [place], Dionne Bucy, MD   1 year ago  Dyspnea on exertion   [facility] [place], Dionne Bucy, MD   1 year ago Severe persistent asthma without complication   [facility], Dionne Bucy, MD

## 2021-02-21 ENCOUNTER — Other Ambulatory Visit: Payer: Self-pay | Admitting: Family Medicine

## 2021-02-21 DIAGNOSIS — E1165 Type 2 diabetes mellitus with hyperglycemia: Secondary | ICD-10-CM

## 2021-02-21 DIAGNOSIS — Z794 Long term (current) use of insulin: Secondary | ICD-10-CM

## 2021-02-21 MED ORDER — FREESTYLE LIBRE 14 DAY SENSOR MISC: 3 refills | Status: DC

## 2021-02-21 NOTE — Telephone Encounter (Signed)
Medication Refill - Medication: freestyle libre sensor  Has the patient contacted their pharmacy? Yes per daughter Malachi [place] Preferred Pharm[facility] (with phone number or street name): [facility] [address] in [place] phone [phone]  Agent: Please be advised that RX refills may take up to 3 business days. We ask that you follow-up with your pharmacy.

## 2021-02-26 ENCOUNTER — Telehealth: Payer: Self-pay | Admitting: Family Medicine

## 2021-02-26 ENCOUNTER — Other Ambulatory Visit: Payer: Self-pay | Admitting: Family Medicine

## 2021-02-26 DIAGNOSIS — Z794 Long term (current) use of insulin: Secondary | ICD-10-CM

## 2021-02-26 DIAGNOSIS — E1165 Type 2 diabetes mellitus with hyperglycemia: Secondary | ICD-10-CM

## 2021-02-27 ENCOUNTER — Encounter: Payer: Self-pay | Admitting: Family Medicine

## 2021-02-27 NOTE — Telephone Encounter (Signed)
Requested medication (s) are due for refill today: yes  Requested medication (s) are on the active medication list: yes  Last refill:  06/27/20 #90 1 RF  Future visit scheduled: yes  Notes to clinic:  Cr elevated and Hbg A1C due at next appt   Requested Prescriptions  Pending Prescriptions Disp Refills   JARDIANCE 25 MG TABS tablet [Pharmacy Med Name: Jardiance 25 MG Oral Tablet] 90 tablet 0    Sig: Take 1 tablet by mouth once daily     Endocrinology:  Diabetes - SGLT2 Inhibitors Failed - 02/26/2021 10:09 AM      Failed - Cr in normal range and within 360 days    Creatinine  Date Value Ref Range Status  12/26/2012 1.16 0.60 - 1.30 mg/dL Final   Creatinine, Ser  Date Value Ref Range Status  01/16/2021 1.14 (H) 0.44 - 1.00 mg/dL Final          Failed - HBA1C is between 0 and 7.9 and within 180 days    Hemoglobin A1C  Date Value Ref Range Status  12/25/2012 7.9 (H) 4.2 - 6.3 % Final    Comment:    The American Diabetes Association recommends that a primary goal of therapy should be <7% and that physicians should reevaluate the treatment regimen in patients with HbA1c values consistently >8%.    Hgb A1c MFr Bld  Date Value Ref Range Status  01/16/2021 10.6 (H) 4.8 - 5.6 % Final    Comment:    (NOTE) Pre diabetes:          5.7%-6.4%  Diabetes:              >6.4%  Glycemic control for   <7.0% adults with diabetes           Failed - eGFR in normal range and within 360 days    EGFR (African American)  Date Value Ref Range Status  12/26/2012 56 (L)  Final   GFR calc Af Amer  Date Value Ref Range Status  08/02/2020 45 (L) >59 mL/min/1.73 Final    Comment:    **In accordance with recommendations from the NKF-ASN Task force,**   [facility] is in the process of updating its eGFR calculation to the   2021 CKD-EPI creatinine equation that estimates kidney function   without a race variable.    EGFR (Non-African Amer.)  Date Value Ref Range Status  12/26/2012 48 (L)   Final    Comment:    eGFR values <50mL/min/1.73 m2 may be an indication of chronic kidney disease (CKD). Calculated eGFR is useful in patients with stable renal function. The eGFR calculation will not be reliable in acutely ill patients when serum creatinine is changing rapidly. It is not useful in  patients on dialysis. The eGFR calculation may not be applicable to patients at the low and high extremes of body sizes, pregnant women, and vegetarians. CALCIUM - RESULTS VERIFIED BY REPEAT TESTING.  - NOTIFIED OF CRITICAL VALUE  - C/BRANDY KELLY AT 0715 12/26/12-LAB  - READ-BACK PROCESS PERFORMED.    GFR, Estimated  Date Value Ref Range Status  01/16/2021 50 (L) >60 mL/min Final    Comment:    (NOTE) Calculated using the CKD-EPI Creatinine Equation (2021)           Passed - LDL in normal range and within 360 days    Ldl Cholesterol, Calc  Date Value Ref Range Status  12/25/2012 36 0 - 100 mg/dL Final   LDL Chol Calc (  NIH)  Date Value Ref Range Status  04/28/2020 54 0 - 99 mg/dL Final          Passed - Valid encounter within last 6 months    Recent Outpatient Visits           3 months ago COPD exacerbation [facility])   [place], Vickki Muff, PA-C   6 months ago Type 2 diabetes mellitus with hyperglycemia, without long-term current use of insulin [facility])   [facility], Dionne Bucy, MD   10 months ago Encounter for annual physical exam   [facility], Dionne Bucy, MD   1 year ago Dyspnea on exertion   [facility], Dionne Bucy, MD   1 year ago Severe persistent asthma without complication   [facility] [place] Bacigalupo, Dionne Bucy, MD       Future Appointments             In 3 weeks Bacigalupo, Dionne Bucy, MD [facility] [place]

## 2021-03-03 NOTE — Telephone Encounter (Signed)
Pt's daughter called back to report that the current Phylliss Blakes is too expensive for them. She says that insurance usually pays for her Rx.   Best contact: [phone]

## 2021-03-06 ENCOUNTER — Telehealth: Payer: Self-pay

## 2021-03-06 NOTE — Telephone Encounter (Signed)
Sent documentation to [employer] for PA on [employer].

## 2021-03-06 NOTE — Telephone Encounter (Signed)
Sent documentation to [employer] for approval of [employer]. [phone]

## 2021-03-06 NOTE — Telephone Encounter (Signed)
Copied from [place] [id number]. Topic: General - Other >> Mar 03, 2021  4:57 PM Celene Kras wrote: Reason for CRM: Annita Brod, from [place], calling in regards to a PA for pts freestyle libre meter. She states that she has clinical questions that are needing to be answered. Please advise.

## 2021-03-09 ENCOUNTER — Other Ambulatory Visit: Payer: Self-pay | Admitting: Family Medicine

## 2021-03-09 DIAGNOSIS — J441 Chronic obstructive pulmonary disease with (acute) exacerbation: Secondary | ICD-10-CM

## 2021-03-09 NOTE — Telephone Encounter (Signed)
Medication Refill - Medication: azithromycin  Has the patient contacted their pharmacy? No. Pts daughter called stating that the pt is out of this medication and pharmacy will not refill without provider approval. Please advise.  (Agent: If no, request that the patient contact the pharmacy for the refill.) (Agent: If yes, when and what did the pharmacy advise?)  Preferred Pharmacy (with phone number or street name):  [place], [place] - [place]  [place] [address]  Phone: [phone] Fax: [phone]  Hours: Not open 24 hours   Has the patient been seen for an appointment in the last year OR does the patient have an upcoming appointment? Yes.    Agent: Please be advised that RX refills may take up to 3 business days. We ask that you follow-up with your pharmacy.

## 2021-03-09 NOTE — Telephone Encounter (Signed)
Requested medication (s) are due for refill today:   Not sure  Requested medication (s) are on the active medication list:   Yes  Future visit scheduled:   Yes   Last ordered: 11/01/2020 #13, 3 refills  No protocol assigned to this medication.   Requested Prescriptions  Pending Prescriptions Disp Refills   azithromycin (ZITHROMAX) 250 MG tablet 13 tablet 3    Sig: TAKE 1 TABLET BY MOUTH DAILY ON MONDAY, WEDNESDAY, AND FRIDAY     Off-Protocol Failed - 03/09/2021  2:31 PM      Failed - Medication not assigned to a protocol, review manually.      Passed - Valid encounter within last 12 months    Recent Outpatient Visits           4 months ago COPD exacerbation [facility])   [place], Vickki Muff, PA-C   7 months ago Type 2 diabetes mellitus with hyperglycemia, without long-term current use of insulin [facility]: [place] [facility])   [address] [place], Dionne Bucy, MD   10 months ago Encounter for annual physical exam   [facility], Dionne Bucy, MD   1 year ago Dyspnea on exertion   [facility] [place], Dionne Bucy, MD   1 year ago Severe persistent asthma without complication   [facility] Bacigalupo, Dionne Bucy, MD       Future Appointments             In 2 weeks Bacigalupo, Dionne Bucy, MD [facility], [place]

## 2021-03-10 MED ORDER — AZITHROMYCIN 250 MG PO TABS: ORAL_TABLET | ORAL | 3 refills | Status: DC

## 2021-03-16 DIAGNOSIS — R3981 Functional urinary incontinence: Secondary | ICD-10-CM | POA: Diagnosis not present

## 2021-03-19 ENCOUNTER — Other Ambulatory Visit: Payer: Self-pay | Admitting: Family Medicine

## 2021-03-19 NOTE — Telephone Encounter (Signed)
Requested medication (s) are due for refill today: 1 month early   Requested medication (s) are on the active medication list: yes  Last refill:  02/23/21 #30  Future visit scheduled: yes 03/23/21  Notes to clinic:  last Hgb A1C 01/16/21 10.6%   Requested Prescriptions  Pending Prescriptions Disp Refills   JANUVIA 100 MG tablet [Pharmacy Med Name: Januvia 100 MG Oral Tablet] 30 tablet 0    Sig: TAKE 1 TABLET BY MOUTH ONCE DAILY . APPOINTMENT REQUIRED FOR FUTURE REFILLS     Endocrinology:  Diabetes - DPP-4 Inhibitors Failed - 03/19/2021  2:54 PM      Failed - HBA1C is between 0 and 7.9 and within 180 days    Hemoglobin A1C  Date Value Ref Range Status  12/25/2012 7.9 (H) 4.2 - 6.3 % Final    Comment:    The American Diabetes Association recommends that a primary goal of therapy should be <7% and that physicians should reevaluate the treatment regimen in patients with HbA1c values consistently >8%.    Hgb A1c MFr Bld  Date Value Ref Range Status  01/16/2021 10.6 (H) 4.8 - 5.6 % Final    Comment:    (NOTE) Pre diabetes:          5.7%-6.4%  Diabetes:              >6.4%  Glycemic control for   <7.0% adults with diabetes           Failed - Cr in normal range and within 360 days    Creatinine  Date Value Ref Range Status  12/26/2012 1.16 0.60 - 1.30 mg/dL Final   Creatinine, Ser  Date Value Ref Range Status  01/16/2021 1.14 (H) 0.44 - 1.00 mg/dL Final          Passed - Valid encounter within last 6 months    Recent Outpatient Visits           4 months ago COPD exacerbation [facility])   [place], Vickki Muff, PA-C   7 months ago Type 2 diabetes mellitus with hyperglycemia, without long-term current use of insulin [facility])   [id number] [place], Dionne Bucy, MD   10 months ago Encounter for annual physical exam   [facility] [place], Dionne Bucy, MD   1 year ago Dyspnea on exertion   [facility]  [place], Dionne Bucy, MD   1 year ago Severe persistent asthma without complication   [facility] Bacigalupo, Dionne Bucy, MD       Future Appointments             In 4 days Bacigalupo, Dionne Bucy, MD [facility] [place]

## 2021-03-23 ENCOUNTER — Ambulatory Visit: Payer: Medicaid Other | Admitting: Family Medicine

## 2021-03-28 ENCOUNTER — Other Ambulatory Visit: Payer: Self-pay | Admitting: Family Medicine

## 2021-03-28 NOTE — Telephone Encounter (Signed)
Requested Prescriptions  Pending Prescriptions Disp Refills  . JANUVIA 100 MG tablet [Pharmacy Med Name: Januvia 100 MG Oral Tablet] 30 tablet 0    Sig: TAKE 1 TABLET BY MOUTH ONCE DAILY . APPOINTMENT REQUIRED FOR FUTURE REFILLS     Endocrinology:  Diabetes - DPP-4 Inhibitors Failed - 03/28/2021  5:35 PM      Failed - HBA1C is between 0 and 7.9 and within 180 days    Hemoglobin A1C  Date Value Ref Range Status  12/25/2012 7.9 (H) 4.2 - 6.3 % Final    Comment:    The American Diabetes Association recommends that a primary goal of therapy should be <7% and that physicians should reevaluate the treatment regimen in patients with HbA1c values consistently >8%.    Hgb A1c MFr Bld  Date Value Ref Range Status  01/16/2021 10.6 (H) 4.8 - 5.6 % Final    Comment:    (NOTE) Pre diabetes:          5.7%-6.4%  Diabetes:              >6.4%  Glycemic control for   <7.0% adults with diabetes          Failed - Cr in normal range and within 360 days    Creatinine  Date Value Ref Range Status  12/26/2012 1.16 0.60 - 1.30 mg/dL Final   Creatinine, Ser  Date Value Ref Range Status  01/16/2021 1.14 (H) 0.44 - 1.00 mg/dL Final         Passed - Valid encounter within last 6 months    Recent Outpatient Visits          4 months ago COPD exacerbation ([place])   [place], Vickki Muff, PA-C   7 months ago Type 2 diabetes mellitus with hyperglycemia, without long-term current use of insulin ([place])   [facility] [place], Dionne Bucy, MD   11 months ago Encounter for annual physical exam   [facility] [place], Dionne Bucy, MD   1 year ago Dyspnea on exertion   [facility] [place], Dionne Bucy, MD   1 year ago Severe persistent asthma without complication   [facility] [place], Dionne Bucy, MD      Future Appointments            In 1 month Bacigalupo, Dionne Bucy, MD [facility]           .  metFORMIN (GLUCOPHAGE) 1000 MG tablet [Pharmacy Med Name: metFORMIN HCl 1000 MG Oral Tablet] 180 tablet 0    Sig: TAKE 1 TABLET BY MOUTH TWICE DAILY WITH  A  MEAL     Endocrinology:  Diabetes - Biguanides Failed - 03/28/2021  5:35 PM      Failed - Cr in normal range and within 360 days    Creatinine  Date Value Ref Range Status  12/26/2012 1.16 0.60 - 1.30 mg/dL Final   Creatinine, Ser  Date Value Ref Range Status  01/16/2021 1.14 (H) 0.44 - 1.00 mg/dL Final         Failed - HBA1C is between 0 and 7.9 and within 180 days    Hemoglobin A1C  Date Value Ref Range Status  12/25/2012 7.9 (H) 4.2 - 6.3 % Final    Comment:    The American Diabetes Association recommends that a primary goal of therapy should be <7% and that physicians should reevaluate the treatment regimen in patients with HbA1c values consistently >8%.  Hgb A1c MFr Bld  Date Value Ref Range Status  01/16/2021 10.6 (H) 4.8 - 5.6 % Final    Comment:    (NOTE) Pre diabetes:          5.7%-6.4%  Diabetes:              >6.4%  Glycemic control for   <7.0% adults with diabetes          Failed - eGFR in normal range and within 360 days    EGFR (African American)  Date Value Ref Range Status  12/26/2012 56 (L)  Final   GFR calc Af Amer  Date Value Ref Range Status  08/02/2020 45 (L) >59 mL/min/1.73 Final    Comment:    **In accordance with recommendations from the NKF-ASN Task force,**   [facility] is in the process of updating its eGFR calculation to the   2021 CKD-EPI creatinine equation that estimates kidney function   without a race variable.    EGFR (Non-African Amer.)  Date Value Ref Range Status  12/26/2012 48 (L)  Final    Comment:    eGFR values <38m/min/1.73 m2 may be an indication of chronic kidney disease (CKD). Calculated eGFR is useful in patients with stable renal function. The eGFR calculation will not be reliable in acutely ill patients when serum creatinine is changing rapidly. It is  not useful in  patients on dialysis. The eGFR calculation may not be applicable to patients at the low and high extremes of body sizes, pregnant women, and vegetarians. CALCIUM - RESULTS VERIFIED BY REPEAT TESTING.  - NOTIFIED OF CRITICAL VALUE  - C/BRANDY KELLY AT 0715 12/26/12-LAB  - READ-BACK PROCESS PERFORMED.    GFR, Estimated  Date Value Ref Range Status  01/16/2021 50 (L) >60 mL/min Final    Comment:    (NOTE) Calculated using the CKD-EPI Creatinine Equation (2021)          Passed - Valid encounter within last 6 months    Recent Outpatient Visits          4 months ago COPD exacerbation ([facility]   [place] Muff PA-C   7 months ago Type 2 diabetes mellitus with hyperglycemia, without long-term current use of insulin ([facility]   [facility]   11 months ago Encounter for annual physical exam   [facility]   1 year ago Dyspnea on exertion   [facility]   1 year ago Severe persistent asthma without complication   [facility]      Future Appointments            In 1 month Bacigalupo, ADionne Bucy MD [facility]

## 2021-03-29 NOTE — Telephone Encounter (Signed)
Requested medication (s) are due for refill today:   Yes  Requested medication (s) are on the active medication list:   Yes  Future visit scheduled:   Yes 04/27/2021 with Bacigalupo   Last ordered: 03/20/2021 #30, 0 refills  Returned for provider to review for refills prior to appt on 11/17 since 30 day courtesy supply has been given.   Requested Prescriptions  Pending Prescriptions Disp Refills   JANUVIA 100 MG tablet [Pharmacy Med Name: Januvia 100 MG Oral Tablet] 30 tablet 0    Sig: TAKE 1 TABLET BY MOUTH ONCE DAILY . APPOINTMENT REQUIRED FOR FUTURE REFILLS     Endocrinology:  Diabetes - DPP-4 Inhibitors Failed - 03/29/2021 11:12 AM      Failed - HBA1C is between 0 and 7.9 and within 180 days    Hemoglobin A1C  Date Value Ref Range Status  12/25/2012 7.9 (H) 4.2 - 6.3 % Final    Comment:    The American Diabetes Association recommends that a primary goal of therapy should be <7% and that physicians should reevaluate the treatment regimen in patients with HbA1c values consistently >8%.    Hgb A1c MFr Bld  Date Value Ref Range Status  01/16/2021 10.6 (H) 4.8 - 5.6 % Final    Comment:    (NOTE) Pre diabetes:          5.7%-6.4%  Diabetes:              >6.4%  Glycemic control for   <7.0% adults with diabetes           Failed - Cr in normal range and within 360 days    Creatinine  Date Value Ref Range Status  12/26/2012 1.16 0.60 - 1.30 mg/dL Final   Creatinine, Ser  Date Value Ref Range Status  01/16/2021 1.14 (H) 0.44 - 1.00 mg/dL Final          Passed - Valid encounter within last 6 months    Recent Outpatient Visits           4 months ago COPD exacerbation (HCC)   [facility] [place], Dennis E, PA-C   7 months ago Type 2 diabetes mellitus with hyperglycemia, without long-term current use of insulin (HCC)   [facility] Bacigalupo, Angela M, MD   11 months ago Encounter for annual physical exam   [facility]  Bacigalupo, Angela M, MD   1 year ago Dyspnea on exertion   [facility] Bacigalupo, Angela M, MD   1 year ago Severe persistent asthma without complication   [facility] Bacigalupo, Angela M, MD       Future Appointments             In 4 weeks Bacigalupo, Angela M, MD [facility]            Signed Prescriptions Disp Refills   metFORMIN (GLUCOPHAGE) 1000 MG tablet 180 tablet 0    Sig: TAKE 1 TABLET BY MOUTH TWICE DAILY WITH  A  MEAL     Endocrinology:  Diabetes - Biguanides Failed - 03/28/2021  5:35 PM      Failed - Cr in normal range and within 360 days    Creatinine  Date Value Ref Range Status  12/26/2012 1.16 0.60 - 1.30 mg/dL Final   Creatinine, Ser  Date Value Ref Range Status  01/16/2021 1.14 (H) 0.44 - 1.00 mg/dL Final          Failed -   HBA1C is between 0 and 7.9 and within 180 days    Hemoglobin A1C  Date Value Ref Range Status  12/25/2012 7.9 (H) 4.2 - 6.3 % Final    Comment:    The American Diabetes Association recommends that a primary goal of therapy should be <7% and that physicians should reevaluate the treatment regimen in patients with HbA1c values consistently >8%.    Hgb A1c MFr Bld  Date Value Ref Range Status  01/16/2021 10.6 (H) 4.8 - 5.6 % Final    Comment:    (NOTE) Pre diabetes:          5.7%-6.4%  Diabetes:              >6.4%  Glycemic control for   <7.0% adults with diabetes           Failed - eGFR in normal range and within 360 days    EGFR (African American)  Date Value Ref Range Status  12/26/2012 56 (L)  Final   GFR calc Af Amer  Date Value Ref Range Status  08/02/2020 45 (L) >59 mL/min/1.73 Final    Comment:    **In accordance with recommendations from the NKF-ASN Task force,**   [facility] is in the process of updating its eGFR calculation to the   2021 CKD-EPI creatinine equation that estimates kidney function   without a race variable.    EGFR (Non-African Amer.)   Date Value Ref Range Status  12/26/2012 48 (L)  Final    Comment:    eGFR values <58m/min/1.73 m2 may be an indication of chronic kidney disease (CKD). Calculated eGFR is useful in patients with stable renal function. The eGFR calculation will not be reliable in acutely ill patients when serum creatinine is changing rapidly. It is not useful in  patients on dialysis. The eGFR calculation may not be applicable to patients at the low and high extremes of body sizes, pregnant women, and vegetarians. CALCIUM - RESULTS VERIFIED BY REPEAT TESTING.  - NOTIFIED OF CRITICAL VALUE  - C/BRANDY KELLY AT 0715 12/26/12-LAB  - READ-BACK PROCESS PERFORMED.    GFR, Estimated  Date Value Ref Range Status  01/16/2021 50 (L) >60 mL/min Final    Comment:    (NOTE) Calculated using the CKD-EPI Creatinine Equation (2021)           Passed - Valid encounter within last 6 months    Recent Outpatient Visits           4 months ago COPD exacerbation ([facility]   [place]ki Muff PA-C   7 months ago Type 2 diabetes mellitus with hyperglycemia, without long-term current use of insulin ([facility]   [facility]   11 months ago Encounter for annual physical exam   [facility]   1 year ago Dyspnea on exertion   [facility]   1 year ago Severe persistent asthma without complication   [facility] ADionne Bucy MD       Future Appointments             In 4 weeks Bacigalupo, ADionne Bucy MD [facility] P[facility]

## 2021-03-29 NOTE — Telephone Encounter (Signed)
I spoke with [facility] and they did not ever received the Rx sent on 10.10.22 for Januvia / please resend asap / pt also needs a refill for Continuous Blood Gluc Sensor ([employer] [facility] [place], [place]  [address] [place] [address]  Phone:  [phone]  Fax:  [phone]

## 2021-04-02 ENCOUNTER — Emergency Department: Payer: Medicaid Other

## 2021-04-02 ENCOUNTER — Other Ambulatory Visit: Payer: Self-pay

## 2021-04-02 ENCOUNTER — Emergency Department
Admission: EM | Admit: 2021-04-02 | Discharge: 2021-04-02 | Disposition: A | Discharge status: Home / Self Care | Payer: Medicaid Other | Attending: Emergency Medicine | Admitting: Emergency Medicine

## 2021-04-02 DIAGNOSIS — J441 Chronic obstructive pulmonary disease with (acute) exacerbation: Secondary | ICD-10-CM | POA: Diagnosis not present

## 2021-04-02 DIAGNOSIS — J4550 Severe persistent asthma, uncomplicated: Secondary | ICD-10-CM

## 2021-04-02 DIAGNOSIS — E1165 Type 2 diabetes mellitus with hyperglycemia: Secondary | ICD-10-CM

## 2021-04-02 DIAGNOSIS — Z794 Long term (current) use of insulin: Secondary | ICD-10-CM | POA: Insufficient documentation

## 2021-04-02 DIAGNOSIS — E119 Type 2 diabetes mellitus without complications: Secondary | ICD-10-CM | POA: Diagnosis not present

## 2021-04-02 DIAGNOSIS — Z7984 Long term (current) use of oral hypoglycemic drugs: Secondary | ICD-10-CM | POA: Diagnosis not present

## 2021-04-02 DIAGNOSIS — Z831 Family history of other infectious and parasitic diseases: Secondary | ICD-10-CM | POA: Insufficient documentation

## 2021-04-02 DIAGNOSIS — I10 Essential (primary) hypertension: Secondary | ICD-10-CM | POA: Diagnosis not present

## 2021-04-02 DIAGNOSIS — Z79899 Other long term (current) drug therapy: Secondary | ICD-10-CM | POA: Insufficient documentation

## 2021-04-02 DIAGNOSIS — R0602 Shortness of breath: Secondary | ICD-10-CM | POA: Diagnosis not present

## 2021-04-02 LAB — CBC
HCT: 39.1 % (ref 36.0–46.0)
Hemoglobin: 12.3 g/dL (ref 12.0–15.0)
MCH: 24.6 pg — ABNORMAL LOW (ref 26.0–34.0)
MCHC: 31.5 g/dL (ref 30.0–36.0)
MCV: 78.2 fL — ABNORMAL LOW (ref 80.0–100.0)
Platelets: 266 10*3/uL (ref 150–400)
RBC: 5 MIL/uL (ref 3.87–5.11)
RDW: 14.9 % (ref 11.5–15.5)
WBC: 9.2 10*3/uL (ref 4.0–10.5)
nRBC: 0 % (ref 0.0–0.2)

## 2021-04-02 LAB — BASIC METABOLIC PANEL
Anion gap: 12 (ref 5–15)
BUN: 20 mg/dL (ref 8–23)
CO2: 23 mmol/L (ref 22–32)
Calcium: 8.7 mg/dL — ABNORMAL LOW (ref 8.9–10.3)
Chloride: 97 mmol/L — ABNORMAL LOW (ref 98–111)
Creatinine, Ser: 1.03 mg/dL — ABNORMAL HIGH (ref 0.44–1.00)
GFR, Estimated: 56 mL/min — ABNORMAL LOW (ref >60)
Glucose, Bld: 243 mg/dL — ABNORMAL HIGH (ref 70–99)
Potassium: 4.9 mmol/L (ref 3.5–5.1)
Sodium: 132 mmol/L — ABNORMAL LOW (ref 135–145)

## 2021-04-02 LAB — TROPONIN I (HIGH SENSITIVITY)
Troponin I (High Sensitivity): 11 ng/L (ref <18)
Troponin I (High Sensitivity): 4 ng/L (ref <18)

## 2021-04-02 LAB — D-DIMER, QUANTITATIVE: D-Dimer, Quant: 0.64 ug/mL-FEU — ABNORMAL HIGH (ref 0.00–0.50)

## 2021-04-02 LAB — CBG MONITORING, ED: Glucose-Capillary: 440 mg/dL — ABNORMAL HIGH (ref 70–99)

## 2021-04-02 MED ORDER — LANTUS SOLOSTAR 100 UNIT/ML ~~LOC~~ SOPN: 15.0000 [IU] | PEN_INJECTOR | Freq: Every day | SUBCUTANEOUS | 0 refills | Status: DC

## 2021-04-02 MED ORDER — PREDNISONE 20 MG PO TABS: 20.0000 mg | ORAL_TABLET | Freq: Every day | ORAL | 0 refills | Status: DC

## 2021-04-02 MED ORDER — IPRATROPIUM-ALBUTEROL 0.5-2.5 (3) MG/3ML IN SOLN
3.0000 mL | Freq: Once | RESPIRATORY_TRACT | Status: AC
  Filled 2021-04-02: qty 3

## 2021-04-02 MED ORDER — IPRATROPIUM-ALBUTEROL 0.5-2.5 (3) MG/3ML IN SOLN
RESPIRATORY_TRACT | Status: AC
  Administered 2021-04-02: 3 mL via RESPIRATORY_TRACT
  Filled 2021-04-02: qty 3

## 2021-04-02 MED ORDER — PREDNISONE 20 MG PO TABS
60.0000 mg | ORAL_TABLET | Freq: Once | ORAL | Status: AC
  Administered 2021-04-02: 60 mg via ORAL
  Filled 2021-04-02: qty 3

## 2021-04-02 MED ORDER — AMOXICILLIN-POT CLAVULANATE 875-125 MG PO TABS: 1.0000 | ORAL_TABLET | Freq: Two times a day (BID) | ORAL | 0 refills | Status: AC

## 2021-04-02 MED ORDER — GUAIFENESIN ER 600 MG PO TB12: 600.0000 mg | ORAL_TABLET | Freq: Two times a day (BID) | ORAL | 0 refills | Status: AC

## 2021-04-02 MED ORDER — ALBUTEROL SULFATE (2.5 MG/3ML) 0.083% IN NEBU
3.0000 mL | INHALATION_SOLUTION | RESPIRATORY_TRACT | Status: DC | PRN
  Administered 2021-04-02: 3 mL via RESPIRATORY_TRACT
  Filled 2021-04-02: qty 3

## 2021-04-02 MED ORDER — INSULIN GLARGINE-YFGN 100 UNIT/ML ~~LOC~~ SOLN: 15.0000 [IU] | SUBCUTANEOUS | Status: DC

## 2021-04-02 MED ORDER — IOHEXOL 350 MG/ML SOLN
75.0000 mL | Freq: Once | INTRAVENOUS | Status: AC | PRN
  Administered 2021-04-02: 75 mL via INTRAVENOUS

## 2021-04-02 MED ORDER — BUDESONIDE-FORMOTEROL FUMARATE 160-4.5 MCG/ACT IN AERO: 2.0000 | INHALATION_SPRAY | Freq: Two times a day (BID) | RESPIRATORY_TRACT | 0 refills | Status: DC

## 2021-04-02 NOTE — ED Notes (Signed)
Ppw provided. Pt educated on med changes, Rxs and care at home. IV removed. Pt verbal consent for DC and assisted to [place] via wheelchair with SO.

## 2021-04-02 NOTE — Discharge Instructions (Signed)
Take the Augmentin 1 pill twice a day with food to help with the COPD and use the prednisone 1 pill once a day for 5 days.  Continue to use your inhalers.  Please return for any further shortness of breath or any worsening of symptoms.  Also return for nausea or vomiting or fever.

## 2021-04-02 NOTE — ED Provider Notes (Signed)
[facility] Emergency Department Provider Note   ____________________________________________   Event Date/Time   First MD Initiated Contact with Patient 04/02/21 1349     (approximate)  I have reviewed the triage vital signs and the nursing notes.   HISTORY  Chief Complaint Shortness of Breath    HPI Whitney Fleming is a 75 y.o. female who comes in with a past history of COPD.  She has been short of breath for about 2 days.  She is coughing a lot.  Cough is dry nonproductive.  Is not any better with her inhalers or antibiotics she takes Zithromax every other day.  She has not been running a fever.  She has no chest pain or tightness.         Past Medical History:  Diagnosis Date   Anemia    normocytic anemia   Asthma    Cardiomegaly    COPD (chronic obstructive pulmonary disease) (HCC)    Diabetes mellitus without complication ([place])    Hypertension    Hyponatremia     Patient Active Problem List   Diagnosis Date Noted   COPD exacerbation ([place]) 09/24/2020   Hyperkalemia 09/24/2020   Dyspnea on exertion 11/18/2019   Asthma exacerbation 08/18/2019   COPD (chronic obstructive pulmonary disease) ([place]) 03/03/2018   Hyperlipidemia associated with type 2 diabetes mellitus ([place]) 12/20/2017   GERD (gastroesophageal reflux disease) 12/20/2017   Respiratory failure ([place]) 12/02/2017   Asthma 09/26/2017   HTN (hypertension) 09/26/2017   Diabetes ([place]) 09/26/2017    Past Surgical History:  Procedure Laterality Date   TUBAL LIGATION      Prior to Admission medications   Medication Sig Start Date End Date Taking? Authorizing Provider  amoxicillin-clavulanate (AUGMENTIN) 875-125 MG tablet Take 1 tablet by mouth 2 (two) times daily for 10 days. 04/02/21 04/12/21 Yes Nena Polio, MD  predniSONE (DELTASONE) 20 MG tablet Take 1 tablet (20 mg total) by mouth daily with breakfast. 04/02/21  Yes Nena Polio, MD  Accu-Chek FastClix Lancets MISC Use  as directed to check blood glucose twice daily 06/27/20   [name], MD  albuterol (PROVENTIL) (2.5 MG/3ML) 0.083% nebulizer solution Take 3 mLs (2.5 mg total) by nebulization every 6 (six) hours as needed for wheezing or shortness of breath. 11/29/20   Flora Lipps, MD  albuterol (VENTOLIN HFA) 108 (90 Base) MCG/ACT inhaler Inhale 2 puffs into the lungs every 6 (six) hours as needed for wheezing or shortness of breath. 09/26/20   Val Riles, MD  AMBULATORY NON FORMULARY MEDICATION Medication Name: Aero chamber use as directed. EL:F81.017 12/24/17   [name], MD  amLODipine (NORVASC) 5 MG tablet Take 5 mg by mouth daily.    [provider]  aspirin EC 81 MG tablet Take 1 tablet (81 mg total) by mouth daily. Swallow whole. 01/08/20   Minna Merritts, MD  azithromycin (ZITHROMAX) 250 MG tablet TAKE 1 TABLET BY MOUTH DAILY ON MONDAY, WEDNESDAY, AND FRIDAY 03/10/21   [name], MD  budesonide-formoterol [facility]) 160-4.5 MCG/ACT inhaler Inhale 2 puffs into the lungs 2 (two) times daily. Rinse mouth after use 09/19/20   Flora Lipps, MD  carvedilol (COREG) 12.5 MG tablet Take 1 tablet (12.5 mg total) by mouth 2 (two) times daily with a meal. 06/27/20   Bacigalupo, Dionne Bucy, MD  Continuous Blood Gluc Receiver (FREESTYLE LIBRE 14 DAY READER) DEVI Use to test blood glucose throughout the day, continuously 06/27/20   Flinchum, Kelby Aline, FNP  Continuous Blood Gluc Sensor (FREESTYLE LIBRE 14 DAY SENSOR) MISC Use for 14 days to monitor glucose continuously 02/21/21   Chrismon, Simona Huh E, PA-C  glucose blood (ACCU-CHEK GUIDE) test strip USE AS DIRECTED TWICE DAILY 06/27/20   Bacigalupo, Dionne Bucy, MD  guaiFENesin (MUCINEX) 600 MG 12 hr tablet Take 1 tablet (600 mg total) by mouth 2 (two) times daily. 11/21/20 11/21/21  Lavonia Drafts, MD  insulin glargine (LANTUS SOLOSTAR) 100 UNIT/ML Solostar Pen Inject 20 Units into the skin daily. Decrease to 10 units if sugars less then 200  09/27/20   Fritzi Mandes, MD  Insulin Pen Needle (NOVOFINE) 32G X 6 MM MISC Use as directed to inject insulin daily 11/18/19   Bacigalupo, Dionne Bucy, MD  JANUVIA 100 MG tablet TAKE 1 TABLET BY MOUTH ONCE DAILY . APPOINTMENT REQUIRED FOR FUTURE REFILLS 03/30/21   [name], MD  JARDIANCE 25 MG TABS tablet Take 1 tablet by mouth once daily 02/27/21   [name], MD  metFORMIN (GLUCOPHAGE) 1000 MG tablet TAKE 1 TABLET BY MOUTH TWICE DAILY WITH  A  MEAL 03/28/21   Bacigalupo, Dionne Bucy, MD  montelukast (SINGULAIR) 10 MG tablet Take 1 tablet (10 mg total) by mouth at bedtime. 06/27/20   [name], MD  omeprazole (PRILOSEC) 20 MG capsule Take 1 capsule (20 mg total) by mouth 2 (two) times daily before a meal. 11/30/20   Gollan, Kathlene November, MD  rosuvastatin (CRESTOR) 5 MG tablet Take 1 tablet (5 mg total) by mouth daily. 09/27/20   Fritzi Mandes, MD    Allergies Patient has no known allergies.  Family History  Problem Relation Age of Onset   Tuberculosis Other    Healthy Mother    Heart disease Father    Asthma Father    Cancer Neg Hx     Social History Social History   Tobacco Use   Smoking status: Never   Smokeless tobacco: Never  Vaping Use   Vaping Use: Never used  Substance Use Topics   Alcohol use: Never   Drug use: Never    Review of Systems  Constitutional: No fever/chills Eyes: No visual changes. ENT: No sore throat. Cardiovascular: Denies chest pain. Respiratory:  shortness of breath. Gastrointestinal: No abdominal pain.  No nausea, no vomiting.  No diarrhea.  No constipation. Genitourinary: Negative for dysuria. Musculoskeletal: Negative for back pain. Skin: Negative for rash. Neurological: Negative for headaches, focal weakness   ____________________________________________   PHYSICAL EXAM:  VITAL SIGNS: ED Triage Vitals  Enc Vitals Group     BP 04/02/21 1132 (!) 148/55     Pulse Rate 04/02/21 1132 81     Resp 04/02/21 1132 (!) 22      Temp 04/02/21 1132 98.1 F (36.7 C)     Temp Source 04/02/21 1132 Oral     SpO2 04/02/21 1132 98 %     Weight --      Height --      Head Circumference --      Peak Flow --      Pain Score 04/02/21 1134 0     Pain Loc --      Pain Edu? --      Excl. in [place]? --    Constitutional: Alert and oriented. Well appearing and in no acute distress. Eyes: Conjunctivae are normal.  Head: Atraumatic. Nose: No congestion/rhinnorhea. Mouth/Throat: Mucous membranes are moist.  Oropharynx non-erythematous. Neck: No stridor.  Cardiovascular: Normal rate, regular rhythm. Grossly normal heart sounds.  Good peripheral circulation. Respiratory: Increased respiratory effort.  Slight retractions. Lungs wheezes throughout Gastrointestinal: Soft and nontender. No distention. No abdominal bruits.  Musculoskeletal: No lower extremity tenderness nor edema.   Neurologic:  Normal speech and language. No gross focal neurologic deficits are appreciated.  Skin:  Skin is warm, dry and intact. No rash noted.   ____________________________________________   LABS (all labs ordered are listed, but only abnormal results are displayed)  Labs Reviewed  CBC - Abnormal; Notable for the following components:      Result Value   MCV 78.2 (*)    MCH 24.6 (*)    All other components within normal limits  BASIC METABOLIC PANEL - Abnormal; Notable for the following components:   Sodium 132 (*)    Chloride 97 (*)    Glucose, Bld 243 (*)    Creatinine, Ser 1.03 (*)    Calcium 8.7 (*)    GFR, Estimated 56 (*)    All other components within normal limits  D-DIMER, QUANTITATIVE  TROPONIN I (HIGH SENSITIVITY)  TROPONIN I (HIGH SENSITIVITY)   ____________________________________________  EKG  EKG read interpreted by me shows normal sinus rhythm rate of 82 normal axis decreased R wave progression in the precordial leads no acute ST-T changes are seen. ____________________________________________  RADIOLOGY Gertha Calkin, personally viewed and evaluated these images (plain radiographs) as part of my medical decision making, as well as reviewing the written report by the radiologist.  ED MD interpretation: Chest x-ray read by radiology reviewed by me does not show any acute pathology  Official radiology report(s): DG Chest 2 View  Result Date: 04/02/2021 CLINICAL DATA:  Shortness of breath. EXAM: CHEST - 2 VIEW COMPARISON:  01/16/2021 FINDINGS: Heart size is normal. There is aortic atherosclerotic calcifications. No signs of pleural effusion or edema. Scarring identified within the posterior lung bases. No airspace consolidation. Visualized osseous structures are unremarkable. IMPRESSION: No acute cardiopulmonary abnormalities. Electronically Signed   By: Kerby Moors M.D.   On: 04/02/2021 12:18    ____________________________________________   PROCEDURES  Procedure(s) performed (including Critical Care):  Procedures   ____________________________________________   INITIAL IMPRESSION / ASSESSMENT AND PLAN / ED COURSE ----------------------------------------- 3:43 PM on 04/02/2021 ----------------------------------------- Patient looks much better but is still wheezing some.  I will give her another nebulizer treatment.  I am still waiting for the second troponin and D-dimer to come back.  I am going to sign the patient out pending the results of these reports.  I have given her Augmentin twice a day and prednisone for the COPD PD I believe she has.              ____________________________________________   FINAL CLINICAL IMPRESSION(S) / ED DIAGNOSES  Final diagnoses:  COPD exacerbation [place])     ED Discharge Orders          Ordered    predniSONE (DELTASONE) 20 MG tablet  Daily with breakfast        04/02/21 1541    amoxicillin-clavulanate (AUGMENTIN) 875-125 MG tablet  2 times daily        04/02/21 1542             Note:  This document was prepared  using Dragon voice recognition software and may include unintentional dictation errors.    Nena Polio, MD 04/02/21 [phone]

## 2021-04-02 NOTE — ED Notes (Signed)
Pt presents to ED with c/;o of SOB for the past 2 days. Pt states HX of COPD. Pt does have audible and wheezing throughout all lobes. Pt does have a dry cough as well. Pt denies chest pain at this time. Pt is A&Ox4. Pt and family states increase SOB during weather change.

## 2021-04-02 NOTE — ED Notes (Signed)
This RN went to update pt on plan of care and CT tech was found to be in the room, when asked if she needed something CT tech states "Yeah she needs a CTA, This RN stated she still needed a IV, CT tech demanded she place IV with float RN requesting she do it, vein was blown by CT tech and IV team had to be consulted.

## 2021-04-02 NOTE — ED Triage Notes (Signed)
Pt comes with c/o SOB for two days.pt has noticeable labored breathing with some accessory muscle use. Pt has audible wheezing present. Pt denies any CP. Pt states worse when walking. Pt has hx of COPD.

## 2021-04-02 NOTE — ED Notes (Signed)
Ambulatory trial- pt walked 100 ft maintaining 96% or great spo2. Pt appears to be wheezy. Pt resting in bed

## 2021-04-02 NOTE — ED Notes (Signed)
This RN went to update pt on plan of care and CT tech was found to be in the room, when asked if she needed something CT tech states "Yeah she needs a CTA, This RN stated she still needed a IV, CT tech demanded she place IV with float RN requesting she do it, vein was blown by CT tech, IV team consult had to be placed.

## 2021-04-02 NOTE — ED Provider Notes (Signed)
Procedures     ----------------------------------------- 8:57 PM on 04/02/2021 -----------------------------------------  Work-up reassuring.  D-dimer was elevated so CT scan was obtained which is unremarkable.  Vitals remained stable.  Patient is feeling improved.  Discussed the results with the patient and her family at bedside, offered hospitalization if she felt that symptoms were still to severe but they feel comfortable with discharge.  Patient maintaining oxygen saturation 96% on room air while ambulating.  Prescriptions for prednisone and Augmentin sent by Dr. Rip Harbour.  I will send refills for Mucinex, Symbicort, Lantus.  I discussed with family monitoring blood sugars and adjusting Lantus dosing as needed for increased hyperglycemia in the setting of prednisone use.  Patient is calm and comfortable, no accessory muscle use.    Carrie Mew, MD 04/02/21 2059

## 2021-04-03 ENCOUNTER — Telehealth: Payer: Self-pay

## 2021-04-03 NOTE — Telephone Encounter (Signed)
Transition Care Management Unsuccessful Follow-up Telephone Call  Date of discharge and from where:  10/23/2022[facility]  Attempts:  1st Attempt  Reason for unsuccessful TCM follow-up call:  Left voice message

## 2021-04-04 NOTE — Telephone Encounter (Signed)
Transition Care Management Unsuccessful Follow-up Telephone Call  Date of discharge and from where:  04/02/2021-[facility]  Attempts:  2nd Attempt  Reason for unsuccessful TCM follow-up call:  Left voice message

## 2021-04-05 NOTE — Telephone Encounter (Signed)
Transition Care Management Unsuccessful Follow-up Telephone Call  Date of discharge and from where:  04/02/2021-[facility]  Attempts:  3rd Attempt  Reason for unsuccessful TCM follow-up call:  Left voice message

## 2021-04-11 ENCOUNTER — Telehealth: Payer: Self-pay

## 2021-04-11 NOTE — Telephone Encounter (Signed)
Called phone number provided for PA. Spoke to [place] who states that insurance no longer cover device. She did however say that we could fax an order to [employer] and try to get device through them. Fax # [phone]. Manisha advised as below. She reports they did receive letter of denial also. Please advise.

## 2021-04-11 NOTE — Telephone Encounter (Signed)
Copied from [place] [id number]. Topic: General - Other >> Apr 11, 2021  2:16 PM Leward Quan A wrote: Reason for CRM: Patient daughter Malachi Paradise called in to say that she received a number from insurance for Prior authorization for Continuous Blood Gluc Sensor (FREESTYLE LIBRE 14 DAY SENSOR) MISC  please utilize Ph#  [phone] pat Ph# [phone]

## 2021-04-13 NOTE — Telephone Encounter (Signed)
We can print the Rx and try to send it to the DME company.

## 2021-04-14 NOTE — Telephone Encounter (Signed)
On your desk to scan and fax to [employer]

## 2021-04-22 ENCOUNTER — Other Ambulatory Visit: Payer: Self-pay | Admitting: Family Medicine

## 2021-04-23 NOTE — Telephone Encounter (Signed)
Requested medication (s) are due for refill today: Yes  Requested medication (s) are on the active medication list: Yes  Last refill:  03/28/21  Future visit scheduled:Yes  Notes to clinic:  Unable to refill per protocol, appointment needed.      Requested Prescriptions  Pending Prescriptions Disp Refills   JANUVIA 100 MG tablet [Pharmacy Med Name: Januvia 100 MG Oral Tablet] 30 tablet 0    Sig: TAKE 1 TABLET BY MOUTH ONCE DAILY (APPOINTMENT  REQUIRED  FOR  FUTURE  REFILLS)     Endocrinology:  Diabetes - DPP-4 Inhibitors Failed - 04/22/2021  4:17 PM      Failed - HBA1C is between 0 and 7.9 and within 180 days    Hemoglobin A1C  Date Value Ref Range Status  12/25/2012 7.9 (H) 4.2 - 6.3 % Final    Comment:    The American Diabetes Association recommends that a primary goal of therapy should be <7% and that physicians should reevaluate the treatment regimen in patients with HbA1c values consistently >8%.    Hgb A1c MFr Bld  Date Value Ref Range Status  01/16/2021 10.6 (H) 4.8 - 5.6 % Final    Comment:    (NOTE) Pre diabetes:          5.7%-6.4%  Diabetes:              >6.4%  Glycemic control for   <7.0% adults with diabetes           Failed - Cr in normal range and within 360 days    Creatinine  Date Value Ref Range Status  12/26/2012 1.16 0.60 - 1.30 mg/dL Final   Creatinine, Ser  Date Value Ref Range Status  04/02/2021 1.03 (H) 0.44 - 1.00 mg/dL Final          Passed - Valid encounter within last 6 months    Recent Outpatient Visits           5 months ago COPD exacerbation [facility])   [place], Vickki Muff, PA-C   8 months ago Type 2 diabetes mellitus with hyperglycemia, without long-term current use of insulin [facility])   [facility] [place], Dionne Bucy, MD   12 months ago Encounter for annual physical exam   [facility] [place], Dionne Bucy, MD   1 year ago Dyspnea on exertion   [facility] [place], Dionne Bucy, MD   1 year ago Severe persistent asthma without complication   [facility] Bacigalupo, Dionne Bucy, MD       Future Appointments             In 4 days Bacigalupo, Dionne Bucy, MD [facility], [place]

## 2021-04-24 ENCOUNTER — Telehealth: Payer: Self-pay | Admitting: Internal Medicine

## 2021-04-24 DIAGNOSIS — J4550 Severe persistent asthma, uncomplicated: Secondary | ICD-10-CM

## 2021-04-25 NOTE — Telephone Encounter (Signed)
Called and spoke with Mineka with [employer] who states that patient was discharged on 10/23 from hospital for COPD exacerbation. Patient needs bronchodilator rx'd within 30 days of being discharged(11/23)    Please advise [phone]  [id number](case ID)

## 2021-04-27 ENCOUNTER — Encounter: Payer: Self-pay | Admitting: Family Medicine

## 2021-04-27 ENCOUNTER — Other Ambulatory Visit: Payer: Self-pay

## 2021-04-27 ENCOUNTER — Ambulatory Visit: Payer: Medicaid Other | Admitting: Family Medicine

## 2021-04-27 VITALS — BP 120/60 | HR 66 | Temp 97.8°F | Resp 16

## 2021-04-27 DIAGNOSIS — Z794 Long term (current) use of insulin: Secondary | ICD-10-CM

## 2021-04-27 DIAGNOSIS — E1159 Type 2 diabetes mellitus with other circulatory complications: Secondary | ICD-10-CM

## 2021-04-27 DIAGNOSIS — H9193 Unspecified hearing loss, bilateral: Secondary | ICD-10-CM | POA: Diagnosis not present

## 2021-04-27 DIAGNOSIS — I152 Hypertension secondary to endocrine disorders: Secondary | ICD-10-CM | POA: Diagnosis not present

## 2021-04-27 DIAGNOSIS — R0609 Other forms of dyspnea: Secondary | ICD-10-CM | POA: Diagnosis not present

## 2021-04-27 DIAGNOSIS — E1165 Type 2 diabetes mellitus with hyperglycemia: Secondary | ICD-10-CM

## 2021-04-27 DIAGNOSIS — E1169 Type 2 diabetes mellitus with other specified complication: Secondary | ICD-10-CM | POA: Diagnosis not present

## 2021-04-27 DIAGNOSIS — J411 Mucopurulent chronic bronchitis: Secondary | ICD-10-CM

## 2021-04-27 DIAGNOSIS — Z23 Encounter for immunization: Secondary | ICD-10-CM

## 2021-04-27 DIAGNOSIS — E785 Hyperlipidemia, unspecified: Secondary | ICD-10-CM

## 2021-04-27 DIAGNOSIS — J441 Chronic obstructive pulmonary disease with (acute) exacerbation: Secondary | ICD-10-CM

## 2021-04-27 LAB — POCT GLYCOSYLATED HEMOGLOBIN (HGB A1C)
Est. average glucose Bld gHb Est-mCnc: 249
Hemoglobin A1C: 10.3 % — AB (ref 4.0–5.6)

## 2021-04-27 MED ORDER — SITAGLIPTIN PHOSPHATE 100 MG PO TABS: 100.0000 mg | ORAL_TABLET | Freq: Every day | ORAL | 1 refills | Status: DC

## 2021-04-27 MED ORDER — EMPAGLIFLOZIN 25 MG PO TABS: 25.0000 mg | ORAL_TABLET | Freq: Every day | ORAL | 1 refills | Status: DC

## 2021-04-27 MED ORDER — AZITHROMYCIN 250 MG PO TABS: ORAL_TABLET | ORAL | 3 refills | Status: DC

## 2021-04-27 MED ORDER — MONTELUKAST SODIUM 10 MG PO TABS: 10.0000 mg | ORAL_TABLET | Freq: Every day | ORAL | 1 refills | Status: DC

## 2021-04-27 NOTE — Assessment & Plan Note (Signed)
Well controlled Continue current medications Recheck metabolic panel F/u in 6 months  

## 2021-04-27 NOTE — Assessment & Plan Note (Signed)
F/b pum Rx for wheelchair

## 2021-04-27 NOTE — Patient Instructions (Addendum)
The CDC recommends two doses of Shingrix (the shingles vaccine) separated by 2 to 6 months for adults age 77 years and older. I recommend checking with your insurance plan regarding coverage for this vaccine.    Increase Lantus to 15 units daily - important to take regularly either morning or night

## 2021-04-27 NOTE — Assessment & Plan Note (Signed)
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

## 2021-04-27 NOTE — Assessment & Plan Note (Signed)
Chronic and uncontrolled with hyperglycemia Continue current oral meds Increase lantus to 15 units daily Discussed importance of taking regularly Discussed need for eye exam F/u in 26m and repeat A1c

## 2021-04-27 NOTE — Assessment & Plan Note (Signed)
Previously well controlled Continue statin Repeat FLP and CMP  

## 2021-04-27 NOTE — Assessment & Plan Note (Signed)
Declines audiology referral Will buy OTC hearing aids

## 2021-04-27 NOTE — Progress Notes (Signed)
Established patient visit   Patient: Whitney Fleming   DOB: 1945-09-16   75 y.o. Female  MRN: [id number] Visit Date: 04/27/2021  Today's healthcare provider: Lavon Paganini, MD   Chief Complaint  Patient presents with   Diabetes   Subjective    HPI  Diabetes Mellitus Type II, follow-up  Lab Results  Component Value Date   HGBA1C 10.3 (A) 04/27/2021   HGBA1C 10.6 (H) 01/16/2021   HGBA1C 10.4 (A) 08/02/2020   Last seen for diabetes 3 months ago.  Management since then includes continuing the same treatment. She reports excellent compliance with treatment. She is not having side effects.   Home blood sugar records: fasting range: 150-200's  Episodes of hypoglycemia? No    Current insulin regiment: none Most Recent Eye Exam:   --------------------------------------------------------------------------------------------------- Hypertension, follow-up  BP Readings from Last 3 Encounters:  04/27/21 120/60  04/02/21 (!) 148/60  01/16/21 (!) 142/65   Wt Readings from Last 3 Encounters:  01/16/21 160 lb (72.6 kg)  11/30/20 162 lb 8 oz (73.7 kg)  11/29/20 160 lb 12.8 oz (72.9 kg)     She was last seen for hypertension 3 months ago.  BP at that visit was 142/65. Management since that visit includes no changes. She reports excellent compliance with treatment. She is not having side effects.  She is not exercising. She is not adherent to low salt diet.   Outside blood pressures are not being checked.  She does not smoke.  Use of agents associated with hypertension: none.   --------------------------------------------------------------------------------------------------- Lipid/Cholesterol, follow-up  Last Lipid Panel: Lab Results  Component Value Date   CHOL 127 04/28/2020   LDLCALC 54 04/28/2020   HDL 49 04/28/2020   TRIG 138 04/28/2020    She was last seen for this 6 months ago.  Management since that visit includes no changes.  She reports  excellent compliance with treatment. She is not having side effects.   Symptoms: No appetite changes No foot ulcerations  No chest pain No chest pressure/discomfort  No dyspnea No orthopnea  No fatigue No lower extremity edema  No palpitations No paroxysmal nocturnal dyspnea  No nausea Yes numbness or tingling of extremity  No polydipsia No polyuria  No speech difficulty No syncope   She is following a Regular diet. Current exercise: none  Last metabolic panel Lab Results  Component Value Date   GLUCOSE 243 (H) 04/02/2021   NA 132 (L) 04/02/2021   K 4.9 04/02/2021   BUN 20 04/02/2021   CREATININE 1.03 (H) 04/02/2021   GFRNONAA 56 (L) 04/02/2021   CALCIUM 8.7 (L) 04/02/2021   AST 18 11/21/2020   ALT 15 11/21/2020   The ASCVD Risk score (Arnett DK, et al., 2019) failed to calculate for the following reasons:   The valid total cholesterol range is 130 to 320 mg/dL  ---------------------------------------------------------------------------------------------------   Medications: Outpatient Medications Prior to Visit  Medication Sig   Accu-Chek FastClix Lancets MISC Use as directed to check blood glucose twice daily   albuterol (PROVENTIL) (2.5 MG/3ML) 0.083% nebulizer solution Take 3 mLs (2.5 mg total) by nebulization every 6 (six) hours as needed for wheezing or shortness of breath.   albuterol (VENTOLIN HFA) 108 (90 Base) MCG/ACT inhaler Inhale 2 puffs into the lungs every 6 (six) hours as needed for wheezing or shortness of breath.   AMBULATORY NON FORMULARY MEDICATION Medication Name: Aero chamber use as directed. DX:J45.909   amLODipine (NORVASC) 5 MG tablet Take 5 mg by  mouth daily.   aspirin EC 81 MG tablet Take 1 tablet (81 mg total) by mouth daily. Swallow whole.   budesonide-formoterol (SYMBICORT) 160-4.5 MCG/ACT inhaler Inhale 2 puffs into the lungs 2 (two) times daily. Rinse mouth after use   carvedilol (COREG) 12.5 MG tablet Take 1 tablet (12.5 mg total) by  mouth 2 (two) times daily with a meal.   glucose blood (ACCU-CHEK GUIDE) test strip USE AS DIRECTED TWICE DAILY   guaiFENesin (MUCINEX) 600 MG 12 hr tablet Take 1 tablet (600 mg total) by mouth 2 (two) times daily.   Insulin Pen Needle (NOVOFINE) 32G X 6 MM MISC Use as directed to inject insulin daily   metFORMIN (GLUCOPHAGE) 1000 MG tablet TAKE 1 TABLET BY MOUTH TWICE DAILY WITH  A  MEAL   rosuvastatin (CRESTOR) 5 MG tablet Take 1 tablet (5 mg total) by mouth daily.   [DISCONTINUED] azithromycin (ZITHROMAX) 250 MG tablet TAKE 1 TABLET BY MOUTH DAILY ON MONDAY, WEDNESDAY, AND FRIDAY   [DISCONTINUED] JANUVIA 100 MG tablet TAKE 1 TABLET BY MOUTH ONCE DAILY . APPOINTMENT REQUIRED FOR FUTURE REFILLS   [DISCONTINUED] JARDIANCE 25 MG TABS tablet Take 1 tablet by mouth once daily   [DISCONTINUED] montelukast (SINGULAIR) 10 MG tablet Take 1 tablet (10 mg total) by mouth at bedtime.   insulin glargine (LANTUS SOLOSTAR) 100 UNIT/ML Solostar Pen Inject 15 Units into the skin daily for 7 days. Decrease to 10 units if sugars less then 200   [DISCONTINUED] Continuous Blood Gluc Receiver (FREESTYLE LIBRE 14 DAY READER) DEVI Use to test blood glucose throughout the day, continuously (Patient not taking: Reported on 04/27/2021)   [DISCONTINUED] Continuous Blood Gluc Sensor (FREESTYLE LIBRE 14 DAY SENSOR) MISC Use for 14 days to monitor glucose continuously (Patient not taking: Reported on 04/27/2021)   [DISCONTINUED] omeprazole (PRILOSEC) 20 MG capsule Take 1 capsule (20 mg total) by mouth 2 (two) times daily before a meal. (Patient not taking: Reported on 04/27/2021)   [DISCONTINUED] predniSONE (DELTASONE) 20 MG tablet Take 1 tablet (20 mg total) by mouth daily with breakfast. (Patient not taking: Reported on 04/27/2021)   No facility-administered medications prior to visit.    Review of Systems  Constitutional:  Negative for appetite change.  Respiratory:  Negative for chest tightness and shortness of breath.    Cardiovascular:  Negative for chest pain and palpitations.  Musculoskeletal:  Positive for back pain.       Objective    BP 120/60 (BP Location: Left Arm, Patient Position: Sitting, Cuff Size: Large)   Pulse 66   Temp 97.8 F (36.6 C) (Temporal)   Resp 16   SpO2 98%  BP Readings from Last 3 Encounters:  04/27/21 120/60  04/02/21 (!) 148/60  01/16/21 (!) 142/65   Wt Readings from Last 3 Encounters:  01/16/21 160 lb (72.6 kg)  11/30/20 162 lb 8 oz (73.7 kg)  11/29/20 160 lb 12.8 oz (72.9 kg)      Physical Exam Vitals reviewed.  Constitutional:      General: She is not in acute distress.    Appearance: Normal appearance. She is well-developed. She is not diaphoretic.  HENT:     Head: Normocephalic and atraumatic.  Eyes:     General: No scleral icterus.    Conjunctiva/sclera: Conjunctivae normal.  Neck:     Thyroid: No thyromegaly.  Cardiovascular:     Rate and Rhythm: Normal rate and regular rhythm.     Pulses: Normal pulses.     Heart sounds: Normal  heart sounds. No murmur heard. Pulmonary:     Effort: Pulmonary effort is normal. No respiratory distress.     Breath sounds: Normal breath sounds. No wheezing, rhonchi or rales.  Musculoskeletal:     Cervical back: Neck supple.     Right lower leg: No edema.     Left lower leg: No edema.  Lymphadenopathy:     Cervical: No cervical adenopathy.  Skin:    General: Skin is warm and dry.     Findings: No rash.  Neurological:     Mental Status: She is alert and oriented to person, place, and time. Mental status is at baseline.  Psychiatric:        Mood and Affect: Mood normal.        Behavior: Behavior normal.      Results for orders placed or performed in visit on 04/27/21  POCT glycosylated hemoglobin (Hb A1C)  Result Value Ref Range   Hemoglobin A1C 10.3 (A) 4.0 - 5.6 %   Est. average glucose Bld gHb Est-mCnc 249     Assessment & Plan     Problem List Items Addressed This Visit       Cardiovascular and  Mediastinum   Hypertension associated with diabetes ([place])    Well controlled Continue current medications Recheck metabolic panel F/u in 6 months       Relevant Medications   sitaGLIPtin (JANUVIA) 100 MG tablet   empagliflozin (JARDIANCE) 25 MG TABS tablet   Other Relevant Orders   Comprehensive metabolic panel     Respiratory   COPD (chronic obstructive pulmonary disease) (HCC)    COPD/asthma overlap syndrome Treated by Pulm No changes to meds today Several recent exacerbations Encouraged to f/u with Pulm soon Wheelchair Rx given due to difficultly with DOE      Relevant Medications   montelukast (SINGULAIR) 10 MG tablet   azithromycin (ZITHROMAX) 250 MG tablet     Endocrine   Diabetes ([place]) - Primary    Chronic and uncontrolled with hyperglycemia Continue current oral meds Increase lantus to 15 units daily Discussed importance of taking regularly Discussed need for eye exam F/u in 76m and repeat A1c      Relevant Medications   sitaGLIPtin (JANUVIA) 100 MG tablet   empagliflozin (JARDIANCE) 25 MG TABS tablet   Other Relevant Orders   POCT glycosylated hemoglobin (Hb A1C) (Completed)   Hyperlipidemia associated with type 2 diabetes mellitus ([place])    Previously well controlled Continue statin Repeat FLP and CMP      Relevant Medications   sitaGLIPtin (JANUVIA) 100 MG tablet   empagliflozin (JARDIANCE) 25 MG TABS tablet   Other Relevant Orders   Comprehensive metabolic panel   Lipid panel     Other   Dyspnea on exertion    F/b pum Rx for wheelchair      Hearing difficulty of both ears    Declines audiology referral Will buy OTC hearing aids      Other Visit Diagnoses     COPD exacerbation ([place])       Relevant Medications   montelukast (SINGULAIR) 10 MG tablet   azithromycin (ZITHROMAX) 250 MG tablet      No current exacerbation - pulled with med refills  Return in about 3 months (around 07/28/2021) for chronic disease f/u and 40m physical.       Total time spent on today's visit was greater than 40 minutes, including both face-to-face time and nonface-to-face time personally spent on review of chart (labs  and imaging), discussing labs and goals, discussing further work-up, treatment options, referrals to specialist if needed, reviewing outside records of pertinent, answering patient's questions, and coordinating care.    I, Lavon Paganini, MD, have reviewed all documentation for this visit. The documentation on 04/27/21 for the exam, diagnosis, procedures, and orders are all accurate and complete.   Kaz Auld, Dionne Bucy, MD, MPH [facility]

## 2021-04-28 DIAGNOSIS — R3981 Functional urinary incontinence: Secondary | ICD-10-CM | POA: Diagnosis not present

## 2021-04-28 DIAGNOSIS — J45909 Unspecified asthma, uncomplicated: Secondary | ICD-10-CM | POA: Diagnosis not present

## 2021-04-28 LAB — COMPREHENSIVE METABOLIC PANEL
ALT: 10 IU/L (ref 0–32)
AST: 10 IU/L (ref 0–40)
Albumin/Globulin Ratio: 1.4 (ref 1.2–2.2)
Albumin: 3.9 g/dL (ref 3.7–4.7)
Alkaline Phosphatase: 84 IU/L (ref 44–121)
BUN/Creatinine Ratio: 21 (ref 12–28)
BUN: 23 mg/dL (ref 8–27)
Bilirubin Total: 0.3 mg/dL (ref 0.0–1.2)
CO2: 18 mmol/L — ABNORMAL LOW (ref 20–29)
Calcium: 8.7 mg/dL (ref 8.7–10.3)
Chloride: 98 mmol/L (ref 96–106)
Creatinine, Ser: 1.11 mg/dL — ABNORMAL HIGH (ref 0.57–1.00)
Globulin, Total: 2.8 g/dL (ref 1.5–4.5)
Glucose: 319 mg/dL — ABNORMAL HIGH (ref 70–99)
Potassium: 5.6 mmol/L — ABNORMAL HIGH (ref 3.5–5.2)
Sodium: 132 mmol/L — ABNORMAL LOW (ref 134–144)
Total Protein: 6.7 g/dL (ref 6.0–8.5)
eGFR: 52 mL/min/{1.73_m2} — ABNORMAL LOW (ref >59)

## 2021-04-28 LAB — LIPID PANEL
Chol/HDL Ratio: 3 ratio (ref 0.0–4.4)
Cholesterol, Total: 139 mg/dL (ref 100–199)
HDL: 47 mg/dL (ref >39)
LDL Chol Calc (NIH): 62 mg/dL (ref 0–99)
Triglycerides: 179 mg/dL — ABNORMAL HIGH (ref 0–149)
VLDL Cholesterol Cal: 30 mg/dL (ref 5–40)

## 2021-04-30 ENCOUNTER — Other Ambulatory Visit: Payer: Self-pay | Admitting: Family Medicine

## 2021-04-30 NOTE — Telephone Encounter (Signed)
Requested Prescriptions  Pending Prescriptions Disp Refills  . metFORMIN (GLUCOPHAGE) 1000 MG tablet [Pharmacy Med Name: metFORMIN HCl 1000 MG Oral Tablet] 180 tablet 0    Sig: TAKE 1 TABLET BY MOUTH TWICE DAILY WITH A MEAL     Endocrinology:  Diabetes - Biguanides Failed - 04/30/2021 12:17 PM      Failed - Cr in normal range and within 360 days    Creatinine  Date Value Ref Range Status  12/26/2012 1.16 0.60 - 1.30 mg/dL Final   Creatinine, Ser  Date Value Ref Range Status  04/27/2021 1.11 (H) 0.57 - 1.00 mg/dL Final         Failed - HBA1C is between 0 and 7.9 and within 180 days    Hemoglobin A1C  Date Value Ref Range Status  04/27/2021 10.3 (A) 4.0 - 5.6 % Final  12/25/2012 7.9 (H) 4.2 - 6.3 % Final    Comment:    The American Diabetes Association recommends that a primary goal of therapy should be <7% and that physicians should reevaluate the treatment regimen in patients with HbA1c values consistently >8%.    Hgb A1c MFr Bld  Date Value Ref Range Status  01/16/2021 10.6 (H) 4.8 - 5.6 % Final    Comment:    (NOTE) Pre diabetes:          5.7%-6.4%  Diabetes:              >6.4%  Glycemic control for   <7.0% adults with diabetes          Failed - eGFR in normal range and within 360 days    EGFR (African American)  Date Value Ref Range Status  12/26/2012 56 (L)  Final   GFR calc Af Amer  Date Value Ref Range Status  08/02/2020 45 (L) >59 mL/min/1.73 Final    Comment:    **In accordance with recommendations from the NKF-ASN Task force,**   [facility] is in the process of updating its eGFR calculation to the   2021 CKD-EPI creatinine equation that estimates kidney function   without a race variable.    EGFR (Non-African Amer.)  Date Value Ref Range Status  12/26/2012 48 (L)  Final    Comment:    eGFR values <54mL/min/1.73 m2 may be an indication of chronic kidney disease (CKD). Calculated eGFR is useful in patients with stable renal function. The eGFR  calculation will not be reliable in acutely ill patients when serum creatinine is changing rapidly. It is not useful in  patients on dialysis. The eGFR calculation may not be applicable to patients at the low and high extremes of body sizes, pregnant women, and vegetarians. CALCIUM - RESULTS VERIFIED BY REPEAT TESTING.  - NOTIFIED OF CRITICAL VALUE  - C/BRANDY KELLY AT 0715 12/26/12-LAB  - READ-BACK PROCESS PERFORMED.    GFR, Estimated  Date Value Ref Range Status  04/02/2021 56 (L) >60 mL/min Final    Comment:    (NOTE) Calculated using the CKD-EPI Creatinine Equation (2021)    eGFR  Date Value Ref Range Status  04/27/2021 52 (L) >59 mL/min/1.73 Final         Passed - Valid encounter within last 6 months    Recent Outpatient Visits          3 days ago Type 2 diabetes mellitus with hyperglycemia, with long-term current use of insulin [facility])   [facility] [place], Dionne Bucy, MD   6 months ago COPD exacerbation [facility])   [facility] [place], Vickki Muff, PA-C   9 months ago Type 2 diabetes mellitus with hyperglycemia, without long-term current use of insulin [facility])   [facility] [place], Dionne Bucy, MD   1 year ago Encounter for annual physical exam   [facility], Dionne Bucy, MD   1 year ago Dyspnea on exertion   [facility] [place], Dionne Bucy, MD      Future Appointments            In 2 weeks Flora Lipps, MD [place]   In 3 months Bacigalupo, Dionne Bucy, MD [facility], [place]   In 7 months Bacigalupo, Dionne Bucy, MD [facility]           . carvedilol (COREG) 12.5 MG tablet [Pharmacy Med Name: Carvedilol 12.5 MG Oral Tablet] 180 tablet 0    Sig: TAKE 1 TABLET BY MOUTH TWICE DAILY WITH A MEAL     Cardiovascular:  Beta Blockers Passed - 04/30/2021 12:17 PM      Passed - Last BP in normal range    BP Readings from Last 1 Encounters:   04/27/21 120/60         Passed - Last Heart Rate in normal range    Pulse Readings from Last 1 Encounters:  04/27/21 66         Passed - Valid encounter within last 6 months    Recent Outpatient Visits          3 days ago Type 2 diabetes mellitus with hyperglycemia, with long-term current use of insulin ([place])   [facility] [place], Dionne Bucy, MD   6 months ago COPD exacerbation [facility])   [place], PA-C   9 months ago Type 2 diabetes mellitus with hyperglycemia, without long-term current use of insulin [facility])   [facility] [place], Dionne Bucy, MD   1 year ago Encounter for annual physical exam   [facility] [place], Dionne Bucy, MD   1 year ago Dyspnea on exertion   [facility] [place], Dionne Bucy, MD      Future Appointments            In 2 weeks Flora Lipps, MD [place]   In 3 months Bacigalupo, Dionne Bucy, MD [facility], [place]   In 7 months Bacigalupo, Dionne Bucy, MD [facility], [place]

## 2021-05-02 NOTE — Telephone Encounter (Signed)
Please advise if there is any update on this. 

## 2021-05-03 ENCOUNTER — Telehealth: Payer: Self-pay | Admitting: *Deleted

## 2021-05-03 MED ORDER — ALBUTEROL SULFATE (2.5 MG/3ML) 0.083% IN NEBU: 2.5000 mg | INHALATION_SOLUTION | Freq: Four times a day (QID) | RESPIRATORY_TRACT | 1 refills | Status: DC | PRN

## 2021-05-03 MED ORDER — BUDESONIDE-FORMOTEROL FUMARATE 160-4.5 MCG/ACT IN AERO: 2.0000 | INHALATION_SPRAY | Freq: Two times a day (BID) | RESPIRATORY_TRACT | 6 refills | Status: DC

## 2021-05-03 NOTE — Telephone Encounter (Signed)
She was seen in June by Dr. Mortimer Fries who is her regular pulmonologist, he stated to continue Symbicort.  She was not admitted to the hospital in October, she was only seen in the ED and was given a refill on the Symbicort for a month.  I would recommend that Dr. Zoila Shutter recommendations be followed.

## 2021-05-03 NOTE — Telephone Encounter (Signed)
Copied from [place] [id number]. Topic: General - Other >> May 03, 2021  9:16 AM Tessa Lerner A wrote: Reason for CRM: [place] with [employer] has called to request additional information of the patient's wheelchair order  [employer] would like the patient's height and weight, visit notes from their most recent OV and a wheelchair narrative  Please fax to [phone]   The patient's ID # [id number]  Please contact further if needed

## 2021-05-03 NOTE — Telephone Encounter (Signed)
Symbicort and albuterol solution sent to pharmacy.  Both patient's daughter, [name](DPR) and Normand Sloop with [employer] is aware and voiced understanding.  Nothing further needed.

## 2021-05-03 NOTE — Telephone Encounter (Signed)
Faxed

## 2021-05-03 NOTE — Addendum Note (Signed)
Addended by: Claudette Head A on: 05/03/2021 10:58 AM   Modules accepted: Orders

## 2021-05-03 NOTE — Telephone Encounter (Signed)
Dr. Mortimer Fries is unavailable until 05/08/2021.  Dr.Gonzalez, please advise as today marks day 30. Symbicort was last refilled 04/02/2021

## 2021-05-08 DIAGNOSIS — R0609 Other forms of dyspnea: Secondary | ICD-10-CM | POA: Diagnosis not present

## 2021-05-08 DIAGNOSIS — J449 Chronic obstructive pulmonary disease, unspecified: Secondary | ICD-10-CM | POA: Diagnosis not present

## 2021-05-09 ENCOUNTER — Telehealth: Payer: Self-pay | Admitting: Family Medicine

## 2021-05-09 ENCOUNTER — Other Ambulatory Visit: Payer: Self-pay | Admitting: Family Medicine

## 2021-05-09 DIAGNOSIS — J441 Chronic obstructive pulmonary disease with (acute) exacerbation: Secondary | ICD-10-CM

## 2021-05-09 NOTE — Telephone Encounter (Signed)
Pt called stating that she is needing to have a 90 day supply for the pts azithromycin. Please advise.     [place] [address], [place]  [place] [address]  Phone: [phone] Fax: [phone]  Hours: Not open 24 hours

## 2021-05-10 ENCOUNTER — Other Ambulatory Visit: Payer: Self-pay | Admitting: Family Medicine

## 2021-05-10 ENCOUNTER — Encounter: Payer: Self-pay | Admitting: Family Medicine

## 2021-05-11 ENCOUNTER — Other Ambulatory Visit: Payer: Self-pay | Admitting: Family Medicine

## 2021-05-11 MED ORDER — AZITHROMYCIN 250 MG PO TABS: ORAL_TABLET | ORAL | 3 refills | Status: DC

## 2021-05-11 MED ORDER — CARVEDILOL 12.5 MG PO TABS: 12.5000 mg | ORAL_TABLET | Freq: Two times a day (BID) | ORAL | 1 refills | Status: DC

## 2021-05-11 NOTE — Telephone Encounter (Signed)
Ok to send 90 day supply as requested

## 2021-05-18 ENCOUNTER — Other Ambulatory Visit: Payer: Self-pay

## 2021-05-18 ENCOUNTER — Ambulatory Visit (INDEPENDENT_AMBULATORY_CARE_PROVIDER_SITE_OTHER): Payer: Medicaid Other | Admitting: Internal Medicine

## 2021-05-18 ENCOUNTER — Encounter: Payer: Self-pay | Admitting: Internal Medicine

## 2021-05-18 ENCOUNTER — Ambulatory Visit: Payer: Medicaid Other | Admitting: Internal Medicine

## 2021-05-18 VITALS — BP 130/80 | HR 81 | Temp 98.0°F | Ht 59.0 in | Wt 160.0 lb

## 2021-05-18 DIAGNOSIS — J4550 Severe persistent asthma, uncomplicated: Secondary | ICD-10-CM

## 2021-05-18 MED ORDER — BUDESONIDE 0.5 MG/2ML IN SUSP: 0.5000 mg | Freq: Two times a day (BID) | RESPIRATORY_TRACT | 0 refills | Status: DC

## 2021-05-18 MED ORDER — PREDNISONE 10 MG PO TABS: 10.0000 mg | ORAL_TABLET | Freq: Every day | ORAL | 3 refills | Status: DC

## 2021-05-18 MED ORDER — ARFORMOTEROL TARTRATE 15 MCG/2ML IN NEBU: 15.0000 ug | INHALATION_SOLUTION | Freq: Two times a day (BID) | RESPIRATORY_TRACT | 12 refills | Status: DC

## 2021-05-18 NOTE — Progress Notes (Signed)
* [facility]  Date: 05/18/2021  MRN# [id number] Whitney Fleming 11-04-1945  Whitney Fleming is a 75 y.o. old female seen in follow up for chief complaint of dyspnea.   SYNOPSIS Whitney Fleming is a 75 y.o. female  with severe allergic asthma.  She speaks [place] her daughter translates.  She has had 3 admissions to the hospital in 2019 with asthma exacerbations including most recently in September 2019.  She had not had further hospital admissions thus far this year but required 2 courses of prednisone in May 2020 which caused her blood sugar to spike.  She has not been very compliant with her medications, she has a Symbicort inhaler, however at her last visit she noted that it had run out and she had not bothered refilling it subsequently her asthma became worse.  At last visit she was strongly advised to make sure that she uses her Symbicort inhaler daily as instructed, use albuterol inhaler as needed.  She continues to rely on and asked for courses of prednisone, however we have instructed her that this is only a rescue medication and has several deleterious effects therefore she should be maintained on her regular medications and use prednisone only for exacerbations.  She was asked to continue azithromycin 2 and 50 mg 5 days/week.  She previously had been evaluated for an injectable biologic agent for asthma but declined because she did not want injections.   At last visit she was reminded to use Symbicort inhaler 2 puffs twice daily, in addition to her Xopenex nebulizer.  We previously considered her for Nucala but declined anything that involve injections.  She was therefore maintained on azithromycin 5 times weekly. Patient is noted to have her breathing getting worse, and they were worried that she had to go to ED, she went to see her physician the next day and received prednisone which helped.   She has now started taking symbicort 2 puffs twice per day and rinses per day. She is  taking singulair, azithro 5 days per week. She is off the prednisone and doing well with that. She is using rescue inhaler she is using 2 times per day, pulmicort nebs twice per day.    **IgE 12/24/17; IGe 5.  **CXR 11/05/17; changes of chronic bronchitis, right heart enlargement.  **CBC 02/11/18>> absolute eosinophil count 800. **CBC 09/25/17; Abs eos count 300.    CC Follow-up asthma   HPI Chronic shortness of breath dyspnea exertion Chronic persistent wheezing No longer uses traditional inhaler therapy Previously on nebulized therapy but has not been using it over the last 6 months  Most of her history is provided by her daughter  Overall her prognosis is poor   ONO WNL 6MWT PENDING  Severe wheezing Needs prednisone at this time Patient has not been using her nebulized therapy as prescribed   Medication:    Current Outpatient Medications:    carvedilol (COREG) 12.5 MG tablet, Take 1 tablet (12.5 mg total) by mouth 2 (two) times daily with a meal., Disp: 180 tablet, Rfl: 1   Accu-Chek FastClix Lancets MISC, Use as directed to check blood glucose twice daily, Disp: 100 each, Rfl: 1   albuterol (PROVENTIL) (2.5 MG/3ML) 0.083% nebulizer solution, Take 3 mLs (2.5 mg total) by nebulization every 6 (six) hours as needed for wheezing or shortness of breath., Disp: 1008 mL, Rfl: 1   albuterol (VENTOLIN HFA) 108 (90 Base) MCG/ACT inhaler, Inhale 2 puffs into the lungs every 6 (six) hours as needed for  wheezing or shortness of breath., Disp: 8 g, Rfl: 2   AMBULATORY NON FORMULARY MEDICATION, Medication Name: Aero chamber use as directed. DX:J45.909, Disp: 1 each, Rfl: 0   amLODipine (NORVASC) 5 MG tablet, Take 5 mg by mouth daily., Disp: , Rfl:    aspirin EC 81 MG tablet, Take 1 tablet (81 mg total) by mouth daily. Swallow whole., Disp: 90 tablet, Rfl: 3   azithromycin (ZITHROMAX) 250 MG tablet, TAKE 1 TABLET BY MOUTH DAILY ON MONDAY, WEDNESDAY, AND FRIDAY, Disp: 39 tablet, Rfl: 3    budesonide-formoterol (SYMBICORT) 160-4.5 MCG/ACT inhaler, Inhale 2 puffs into the lungs in the morning and at bedtime., Disp: 1 each, Rfl: 6   empagliflozin (JARDIANCE) 25 MG TABS tablet, Take 1 tablet (25 mg total) by mouth daily., Disp: 90 tablet, Rfl: 1   glucose blood (ACCU-CHEK GUIDE) test strip, USE AS DIRECTED TWICE DAILY, Disp: 50 each, Rfl: 1   guaiFENesin (MUCINEX) 600 MG 12 hr tablet, Take 1 tablet (600 mg total) by mouth 2 (two) times daily., Disp: 60 tablet, Rfl: 0   insulin glargine (LANTUS SOLOSTAR) 100 UNIT/ML Solostar Pen, Inject 15 Units into the skin daily for 7 days. Decrease to 10 units if sugars less then 200, Disp: 15 mL, Rfl: 0   Insulin Pen Needle (NOVOFINE) 32G X 6 MM MISC, Use as directed to inject insulin daily, Disp: 100 each, Rfl: 2   metFORMIN (GLUCOPHAGE) 1000 MG tablet, TAKE 1 TABLET BY MOUTH TWICE DAILY WITH A MEAL, Disp: 180 tablet, Rfl: 0   montelukast (SINGULAIR) 10 MG tablet, Take 1 tablet (10 mg total) by mouth at bedtime., Disp: 90 tablet, Rfl: 1   rosuvastatin (CRESTOR) 5 MG tablet, Take 1 tablet (5 mg total) by mouth daily., Disp: 90 tablet, Rfl: 1   sitaGLIPtin (JANUVIA) 100 MG tablet, Take 1 tablet (100 mg total) by mouth daily., Disp: 90 tablet, Rfl: 1   Allergies:  Patient has no known allergies.     Review of Systems:  Gen:  +fatogue  HEENT: Denies blurred vision, double vision, ear pain, eye pain, hearing loss, nose bleeds, sore throat Cardiac:  No dizziness, chest pain or heaviness, chest tightness,edema, No JVD Resp:   +cough, +sputum production, +shortness of breath,+wheezing, -hemoptysis,  Other:  All other systems negative BP 130/80 (BP Location: Left Arm, Patient Position: Sitting, Cuff Size: Normal)   Pulse 81   Temp 98 F (36.7 C) (Oral)   Ht 4\' 11"  (1.499 m)   Wt 160 lb (72.6 kg)   SpO2 98%   BMI 32.32 kg/m      Physical Examination:   General Appearance: No distress  Neuro:without focal findings,  speech normal,   HEENT: PERRLA, EOM intact.   Pulmonary: normal breath sounds, +wheezing.  CardiovascularNormal S1,S2.  No m/r/g.   ALL OTHER ROS ARE NEGATIVE   Assessment and Plan:   76 year old [place] female with chronic severe persistent asthma currently on Pulmicort and Brovana nebulizers due to the fact that she has respiratory insufficiency and can no longer take traditional inhaler therapy with chronic severe persistent asthma in the setting of coronary artery disease, patient with persistent severe asthma persistent wheezing and will need nebulized therapy indefinitely   Chronic severe persistent asthma Will need nebulizers as prescribed Pulmicort twice daily Brovana twice daily Overall prognosis poor High risk for complication  High risk for multiple hospitalizations   Dyspnea exertion and shortness of breath related to her obesity underlying lung disease with undiagnosed CAD diastolic heart failure Follow-up  with cardiology as controlled    MEDICATION ADJUSTMENTS/LABS AND TESTS ORDERED: Please start Pulmicort nebulizer 2 times per day Please start Brovana  2 times per day ALBUTEROL NEBULIZER every 4 hrs as needed Start PREDNISONE 10 mg daily for 5 days(refills provided) CURRENT MEDICATIONS REVIEWED AT LENGTH WITH PATIENT TODAY  Follow-up 6 months Total time spent 35 minutes   Kortney Potvin Patricia Pesa, M.D.  [facility]  Medical Director [place] Director [facility] Cardio-Pulmonary Department

## 2021-05-18 NOTE — Patient Instructions (Addendum)
Please start Pulmicort nebulizer 2 times per day Please start Brovana  2 times per day ALBUTEROL NEBULIZER every 4 hrs as needed Start PREDNISONE 10 mg daily for 5 days(refills provided)

## 2021-06-02 DIAGNOSIS — J45909 Unspecified asthma, uncomplicated: Secondary | ICD-10-CM | POA: Diagnosis not present

## 2021-06-02 DIAGNOSIS — R3981 Functional urinary incontinence: Secondary | ICD-10-CM | POA: Diagnosis not present

## 2021-06-07 DIAGNOSIS — J449 Chronic obstructive pulmonary disease, unspecified: Secondary | ICD-10-CM | POA: Diagnosis not present

## 2021-06-07 DIAGNOSIS — R0609 Other forms of dyspnea: Secondary | ICD-10-CM | POA: Diagnosis not present

## 2021-06-07 NOTE — Addendum Note (Signed)
Addended by: Shawna Orleans on: 06/07/2021 03:20 PM   Modules accepted: Orders

## 2021-06-11 ENCOUNTER — Other Ambulatory Visit: Payer: Self-pay | Admitting: Family Medicine

## 2021-06-12 ENCOUNTER — Other Ambulatory Visit (HOSPITAL_COMMUNITY): Payer: Self-pay

## 2021-06-12 ENCOUNTER — Telehealth: Payer: Self-pay | Admitting: Pharmacy Technician

## 2021-06-12 NOTE — Telephone Encounter (Signed)
Patient Advocate Encounter  Received notification from [place]  that prior authorization for [facility] is required.   PA NOT NEEDED [id number] Called pharmacy and informed this medication is covered with DUR overrides. Medication has been processed.   Luciano Cutter, CPhT Patient Advocate Phone: [phone] Fax:  [phone]

## 2021-06-16 ENCOUNTER — Telehealth: Payer: Self-pay | Admitting: Internal Medicine

## 2021-06-16 DIAGNOSIS — J4550 Severe persistent asthma, uncomplicated: Secondary | ICD-10-CM

## 2021-06-16 MED ORDER — ARFORMOTEROL TARTRATE 15 MCG/2ML IN NEBU: 15.0000 ug | INHALATION_SOLUTION | Freq: Two times a day (BID) | RESPIRATORY_TRACT | 2 refills | Status: DC

## 2021-06-16 NOTE — Telephone Encounter (Signed)
Spoke to patient's daughter, [name](DPR). Sherron Flemings is requesting a 90 day supply of Brovana.  Rx sent to preferred pharmacy.  Nothing further needed at this time.

## 2021-06-25 ENCOUNTER — Other Ambulatory Visit: Payer: Self-pay | Admitting: Family Medicine

## 2021-06-25 NOTE — Telephone Encounter (Signed)
last RF 04/30/21 #180 has enough until 2/23  Requested Prescriptions  Refused Prescriptions Disp Refills   metFORMIN (GLUCOPHAGE) 1000 MG tablet [Pharmacy Med Name: metFORMIN HCl 1000 MG Oral Tablet] 180 tablet 0    Sig: TAKE 1 TABLET BY MOUTH TWICE DAILY WITH A MEAL     Endocrinology:  Diabetes - Biguanides Failed - 06/25/2021  4:23 PM      Failed - Cr in normal range and within 360 days    Creatinine  Date Value Ref Range Status  12/26/2012 1.16 0.60 - 1.30 mg/dL Final   Creatinine, Ser  Date Value Ref Range Status  04/27/2021 1.11 (H) 0.57 - 1.00 mg/dL Final         Failed - HBA1C is between 0 and 7.9 and within 180 days    Hemoglobin A1C  Date Value Ref Range Status  04/27/2021 10.3 (A) 4.0 - 5.6 % Final  12/25/2012 7.9 (H) 4.2 - 6.3 % Final    Comment:    The American Diabetes Association recommends that a primary goal of therapy should be <7% and that physicians should reevaluate the treatment regimen in patients with HbA1c values consistently >8%.    Hgb A1c MFr Bld  Date Value Ref Range Status  01/16/2021 10.6 (H) 4.8 - 5.6 % Final    Comment:    (NOTE) Pre diabetes:          5.7%-6.4%  Diabetes:              >6.4%  Glycemic control for   <7.0% adults with diabetes          Failed - eGFR in normal range and within 360 days    EGFR (African American)  Date Value Ref Range Status  12/26/2012 56 (L)  Final   GFR calc Af Amer  Date Value Ref Range Status  08/02/2020 45 (L) >59 mL/min/1.73 Final    Comment:    **In accordance with recommendations from the NKF-ASN Task force,**   [facility] is in the process of updating its eGFR calculation to the   2021 CKD-EPI creatinine equation that estimates kidney function   without a race variable.    EGFR (Non-African Amer.)  Date Value Ref Range Status  12/26/2012 48 (L)  Final    Comment:    eGFR values <71mL/min/1.73 m2 may be an indication of chronic kidney disease (CKD). Calculated eGFR is useful in  patients with stable renal function. The eGFR calculation will not be reliable in acutely ill patients when serum creatinine is changing rapidly. It is not useful in  patients on dialysis. The eGFR calculation may not be applicable to patients at the low and high extremes of body sizes, pregnant women, and vegetarians. CALCIUM - RESULTS VERIFIED BY REPEAT TESTING.  - NOTIFIED OF CRITICAL VALUE  - C/BRANDY KELLY AT 0715 12/26/12-LAB  - READ-BACK PROCESS PERFORMED.    GFR, Estimated  Date Value Ref Range Status  04/02/2021 56 (L) >60 mL/min Final    Comment:    (NOTE) Calculated using the CKD-EPI Creatinine Equation (2021)    eGFR  Date Value Ref Range Status  04/27/2021 52 (L) >59 mL/min/1.73 Final         Passed - Valid encounter within last 6 months    Recent Outpatient Visits          1 month ago Type 2 diabetes mellitus with hyperglycemia, with long-term current use of insulin [facility])   [facility], Marzella Schlein, MD  7 months ago COPD exacerbation [facility])   [place], PA-C   10 months ago Type 2 diabetes mellitus with hyperglycemia, without long-term current use of insulin [facility])   [facility] [place], Dionne Bucy, MD   1 year ago Encounter for annual physical exam   [facility] [place], Dionne Bucy, MD   1 year ago Dyspnea on exertion   [place], Dionne Bucy, MD      Future Appointments            In 1 month Bacigalupo, Dionne Bucy, MD [facility], [place]   In 5 months Bacigalupo, Dionne Bucy, MD [facility], [place]

## 2021-06-28 ENCOUNTER — Other Ambulatory Visit: Payer: Self-pay | Admitting: Family Medicine

## 2021-06-28 DIAGNOSIS — E1122 Type 2 diabetes mellitus with diabetic chronic kidney disease: Secondary | ICD-10-CM | POA: Diagnosis not present

## 2021-06-28 DIAGNOSIS — E875 Hyperkalemia: Secondary | ICD-10-CM | POA: Diagnosis not present

## 2021-06-28 DIAGNOSIS — E1169 Type 2 diabetes mellitus with other specified complication: Secondary | ICD-10-CM | POA: Diagnosis not present

## 2021-06-28 DIAGNOSIS — R609 Edema, unspecified: Secondary | ICD-10-CM | POA: Diagnosis not present

## 2021-06-28 DIAGNOSIS — I10 Essential (primary) hypertension: Secondary | ICD-10-CM | POA: Diagnosis not present

## 2021-06-28 DIAGNOSIS — R809 Proteinuria, unspecified: Secondary | ICD-10-CM | POA: Diagnosis not present

## 2021-06-28 DIAGNOSIS — E119 Type 2 diabetes mellitus without complications: Secondary | ICD-10-CM | POA: Diagnosis not present

## 2021-06-28 DIAGNOSIS — N1832 Chronic kidney disease, stage 3b: Secondary | ICD-10-CM | POA: Diagnosis not present

## 2021-06-28 DIAGNOSIS — E782 Mixed hyperlipidemia: Secondary | ICD-10-CM | POA: Diagnosis not present

## 2021-06-28 NOTE — Telephone Encounter (Signed)
Call to pharmacy- they do not have the Rx sent 04/30/21 #180 on file- will resend Requested Prescriptions  Pending Prescriptions Disp Refills   metFORMIN (GLUCOPHAGE) 1000 MG tablet [Pharmacy Med Name: metFORMIN HCl 1000 MG Oral Tablet] 180 tablet 0    Sig: TAKE 1 TABLET BY MOUTH TWICE DAILY WITH A MEAL     Endocrinology:  Diabetes - Biguanides Failed - 06/28/2021  3:28 PM      Failed - Cr in normal range and within 360 days    Creatinine  Date Value Ref Range Status  12/26/2012 1.16 0.60 - 1.30 mg/dL Final   Creatinine, Ser  Date Value Ref Range Status  04/27/2021 1.11 (H) 0.57 - 1.00 mg/dL Final         Failed - HBA1C is between 0 and 7.9 and within 180 days    Hemoglobin A1C  Date Value Ref Range Status  04/27/2021 10.3 (A) 4.0 - 5.6 % Final  12/25/2012 7.9 (H) 4.2 - 6.3 % Final    Comment:    The American Diabetes Association recommends that a primary goal of therapy should be <7% and that physicians should reevaluate the treatment regimen in patients with HbA1c values consistently >8%.    Hgb A1c MFr Bld  Date Value Ref Range Status  01/16/2021 10.6 (H) 4.8 - 5.6 % Final    Comment:    (NOTE) Pre diabetes:          5.7%-6.4%  Diabetes:              >6.4%  Glycemic control for   <7.0% adults with diabetes          Failed - eGFR in normal range and within 360 days    EGFR (African American)  Date Value Ref Range Status  12/26/2012 56 (L)  Final   GFR calc Af Amer  Date Value Ref Range Status  08/02/2020 45 (L) >59 mL/min/1.73 Final    Comment:    **In accordance with recommendations from the NKF-ASN Task force,**   [facility] is in the process of updating its eGFR calculation to the   2021 CKD-EPI creatinine equation that estimates kidney function   without a race variable.    EGFR (Non-African Amer.)  Date Value Ref Range Status  12/26/2012 48 (L)  Final    Comment:    eGFR values <46mL/min/1.73 m2 may be an indication of chronic kidney disease  (CKD). Calculated eGFR is useful in patients with stable renal function. The eGFR calculation will not be reliable in acutely ill patients when serum creatinine is changing rapidly. It is not useful in  patients on dialysis. The eGFR calculation may not be applicable to patients at the low and high extremes of body sizes, pregnant women, and vegetarians. CALCIUM - RESULTS VERIFIED BY REPEAT TESTING.  - NOTIFIED OF CRITICAL VALUE  - C/BRANDY KELLY AT 0715 12/26/12-LAB  - READ-BACK PROCESS PERFORMED.    GFR, Estimated  Date Value Ref Range Status  04/02/2021 56 (L) >60 mL/min Final    Comment:    (NOTE) Calculated using the CKD-EPI Creatinine Equation (2021)    eGFR  Date Value Ref Range Status  04/27/2021 52 (L) >59 mL/min/1.73 Final         Passed - Valid encounter within last 6 months    Recent Outpatient Visits          2 months ago Type 2 diabetes mellitus with hyperglycemia, with long-term current use of insulin ([place])   [facility] Bacigalupo, Dionne Bucy, MD   7 months ago COPD exacerbation [facility])   [place], PA-C   11 months ago Type 2 diabetes mellitus with hyperglycemia, without long-term current use of insulin [facility])   [facility] [place], Dionne Bucy, MD   1 year ago Encounter for annual physical exam   [facility] [place], Dionne Bucy, MD   1 year ago Dyspnea on exertion   [place], Dionne Bucy, MD      Future Appointments            In 1 month Bacigalupo, Dionne Bucy, MD [facility], [place]   In 5 months Bacigalupo, Dionne Bucy, MD [facility], [place]

## 2021-07-08 DIAGNOSIS — R0609 Other forms of dyspnea: Secondary | ICD-10-CM | POA: Diagnosis not present

## 2021-07-08 DIAGNOSIS — J449 Chronic obstructive pulmonary disease, unspecified: Secondary | ICD-10-CM | POA: Diagnosis not present

## 2021-07-16 ENCOUNTER — Other Ambulatory Visit: Payer: Self-pay | Admitting: Family Medicine

## 2021-07-16 DIAGNOSIS — J441 Chronic obstructive pulmonary disease with (acute) exacerbation: Secondary | ICD-10-CM

## 2021-07-21 ENCOUNTER — Telehealth: Payer: Self-pay | Admitting: Family Medicine

## 2021-07-21 NOTE — Telephone Encounter (Signed)
Caller name: Fara Olden  Relation to pt: from [place]  Call back number: [phone] fax # [phone]    Reason for call:  Checking on status of of [employer] also known as CMN form for incontinent supplies for underwear prevail for women. Form faxed to [phone] on   1/1,1/10 and today, please note when received,

## 2021-07-23 ENCOUNTER — Other Ambulatory Visit: Payer: Self-pay | Admitting: Family Medicine

## 2021-07-25 NOTE — Telephone Encounter (Signed)
Was signed and faxed yesterday

## 2021-08-10 ENCOUNTER — Other Ambulatory Visit: Payer: Self-pay

## 2021-08-10 ENCOUNTER — Ambulatory Visit (INDEPENDENT_AMBULATORY_CARE_PROVIDER_SITE_OTHER): Payer: Medicaid Other | Admitting: Family Medicine

## 2021-08-10 ENCOUNTER — Telehealth: Payer: Self-pay | Admitting: Internal Medicine

## 2021-08-10 ENCOUNTER — Encounter: Payer: Self-pay | Admitting: Family Medicine

## 2021-08-10 VITALS — BP 118/78 | HR 82 | Temp 98.2°F | Resp 16 | Wt 158.1 lb

## 2021-08-10 DIAGNOSIS — E785 Hyperlipidemia, unspecified: Secondary | ICD-10-CM

## 2021-08-10 DIAGNOSIS — J411 Mucopurulent chronic bronchitis: Secondary | ICD-10-CM

## 2021-08-10 DIAGNOSIS — Z6831 Body mass index (BMI) 31.0-31.9, adult: Secondary | ICD-10-CM

## 2021-08-10 DIAGNOSIS — E1159 Type 2 diabetes mellitus with other circulatory complications: Secondary | ICD-10-CM

## 2021-08-10 DIAGNOSIS — E669 Obesity, unspecified: Secondary | ICD-10-CM | POA: Diagnosis not present

## 2021-08-10 DIAGNOSIS — E1169 Type 2 diabetes mellitus with other specified complication: Secondary | ICD-10-CM

## 2021-08-10 DIAGNOSIS — I152 Hypertension secondary to endocrine disorders: Secondary | ICD-10-CM | POA: Diagnosis not present

## 2021-08-10 DIAGNOSIS — J4550 Severe persistent asthma, uncomplicated: Secondary | ICD-10-CM

## 2021-08-10 LAB — POCT GLYCOSYLATED HEMOGLOBIN (HGB A1C)
Est. average glucose Bld gHb Est-mCnc: 278
Hemoglobin A1C: 11.3 % — AB (ref 4.0–5.6)

## 2021-08-10 MED ORDER — CARVEDILOL 12.5 MG PO TABS
12.5000 mg | ORAL_TABLET | Freq: Two times a day (BID) | ORAL | 1 refills | Status: DC
Start: 2021-08-10 — End: 2022-01-30

## 2021-08-10 MED ORDER — ARFORMOTEROL TARTRATE 15 MCG/2ML IN NEBU: 15.0000 ug | INHALATION_SOLUTION | Freq: Two times a day (BID) | RESPIRATORY_TRACT | 2 refills | Status: DC

## 2021-08-10 NOTE — Progress Notes (Signed)
I,Sulibeya S Dimas,acting as a Education administrator for Lavon Paganini, MD.,have documented all relevant documentation on the behalf of Lavon Paganini, MD,as directed by  Lavon Paganini, MD while in the presence of Lavon Paganini, MD.   Established patient visit   Patient: Whitney Fleming   DOB: Mar 17, 1946   76 y.o. Female  MRN: [id number] Visit Date: 08/10/2021  Today's healthcare provider: Lavon Paganini, MD   Chief Complaint  Patient presents with   Diabetes   Subjective    HPI   Daughter acts as interpreter for the visit. Declines formal interpreter services  Diabetes [facility])- Primary  Lab Results  Component Value Date   HGBA1C 11.3 (A) 08/10/2021   HGBA1C 10.3 (A) 04/27/2021   HGBA1C 10.6 (H) 01/16/2021   Wt Readings from Last 3 Encounters:  08/10/21 158 lb 1.6 oz (71.7 kg)  05/18/21 160 lb (72.6 kg)  01/16/21 160 lb (72.6 kg)   Last seen for diabetes 3 months ago.  Management since then includes; Chronic and uncontrolled with hyperglycemia. Continue current oral meds. Increase lantus to 15 units daily.  She reports excellent compliance with treatment. She is not having side effects.   Home blood sugar records:  300s   Current insulin regiment: lantus 15 units daily Most Recent Eye Exam: [facility] Current exercise: walking Current diet habits: in general, a "healthy" diet    Pertinent Labs: Lab Results  Component Value Date   CHOL 139 04/27/2021   HDL 47 04/27/2021   LDLCALC 62 04/27/2021   TRIG 179 (H) 04/27/2021   CHOLHDL 3.0 04/27/2021   Lab Results  Component Value Date   NA 132 (L) 04/27/2021   K 5.6 (H) 04/27/2021   CREATININE 1.11 (H) 04/27/2021   EGFR 52 (L) 04/27/2021   MICROALBUR 50 05/01/2018     --------------------------------------------------------------------------------------------------- Lipid/Cholesterol, Follow-up  Last lipid panel Other pertinent labs  Lab Results  Component Value Date   CHOL 139 04/27/2021    HDL 47 04/27/2021   LDLCALC 62 04/27/2021   TRIG 179 (H) 04/27/2021   CHOLHDL 3.0 04/27/2021   Lab Results  Component Value Date   ALT 10 04/27/2021   AST 10 04/27/2021   PLT 266 04/02/2021   TSH 1.959 02/05/2018     She was last seen for this 3 months ago.  Management since that visit includes;  Previously well controlled. Continue statin. Repeat FLP and CMP  She reports excellent compliance with treatment. She is not having side effects.  Current diet: in general, a "healthy" diet   Current exercise: walking  The 10-year ASCVD risk score (Arnett DK, et al., 2019) is: 38%  ---------------------------------------------------------------------------------------------------  Medications: Outpatient Medications Prior to Visit  Medication Sig   Accu-Chek FastClix Lancets MISC Use as directed to check blood glucose twice daily   ACCU-CHEK GUIDE test strip USE AS DIRECTED TWICE DAILY   albuterol (PROVENTIL) (2.5 MG/3ML) 0.083% nebulizer solution Take 3 mLs (2.5 mg total) by nebulization every 6 (six) hours as needed for wheezing or shortness of breath.   albuterol (VENTOLIN HFA) 108 (90 Base) MCG/ACT inhaler Inhale 2 puffs into the lungs every 6 (six) hours as needed for wheezing or shortness of breath.   AMBULATORY NON FORMULARY MEDICATION Medication Name: Aero chamber use as directed. DX:J45.909   amLODipine (NORVASC) 5 MG tablet Take 5 mg by mouth daily.   aspirin EC 81 MG tablet Take 1 tablet (81 mg total) by mouth daily. Swallow whole.   budesonide (PULMICORT) 0.5 MG/2ML nebulizer  solution Take 2 mLs (0.5 mg total) by nebulization 2 (two) times daily.   empagliflozin (JARDIANCE) 25 MG TABS tablet Take 1 tablet (25 mg total) by mouth daily.   guaiFENesin (MUCINEX) 600 MG 12 hr tablet Take 1 tablet (600 mg total) by mouth 2 (two) times daily.   Insulin Pen Needle (NOVOFINE) 32G X 6 MM MISC Use as directed to inject insulin daily   metFORMIN (GLUCOPHAGE) 1000 MG tablet TAKE 1  TABLET BY MOUTH TWICE DAILY WITH A MEAL   montelukast (SINGULAIR) 10 MG tablet Take 1 tablet (10 mg total) by mouth at bedtime.   rosuvastatin (CRESTOR) 5 MG tablet Take 1 tablet by mouth once daily   sitaGLIPtin (JANUVIA) 100 MG tablet Take 1 tablet (100 mg total) by mouth daily.   [DISCONTINUED] arformoterol (BROVANA) 15 MCG/2ML NEBU Take 2 mLs (15 mcg total) by nebulization in the morning and at bedtime.   [DISCONTINUED] azithromycin (ZITHROMAX) 250 MG tablet TAKE 1 TABLET BY MOUTH DAILY ON MONDAY, WEDNESDAY, AND FRIDAY   [DISCONTINUED] carvedilol (COREG) 12.5 MG tablet Take 1 tablet (12.5 mg total) by mouth 2 (two) times daily with a meal.   insulin glargine (LANTUS SOLOSTAR) 100 UNIT/ML Solostar Pen Inject 15 Units into the skin daily for 7 days. Decrease to 10 units if sugars less then 200   [DISCONTINUED] predniSONE (DELTASONE) 10 MG tablet Take 1 tablet (10 mg total) by mouth daily with breakfast. 10 days (Patient not taking: Reported on 08/10/2021)   No facility-administered medications prior to visit.    Review of Systems  All other systems reviewed and are negative.      Objective    BP 118/78 (BP Location: Right Arm, Patient Position: Sitting, Cuff Size: Large)    Pulse 82    Temp 98.2 F (36.8 C) (Temporal)    Resp 16    Wt 158 lb 1.6 oz (71.7 kg)    BMI 31.93 kg/m  BP Readings from Last 3 Encounters:  08/10/21 118/78  05/18/21 130/80  04/27/21 120/60   Wt Readings from Last 3 Encounters:  08/10/21 158 lb 1.6 oz (71.7 kg)  05/18/21 160 lb (72.6 kg)  01/16/21 160 lb (72.6 kg)      Physical Exam Vitals reviewed.  Constitutional:      General: She is not in acute distress.    Appearance: Normal appearance. She is well-developed. She is not diaphoretic.  HENT:     Head: Normocephalic and atraumatic.  Eyes:     General: No scleral icterus.    Conjunctiva/sclera: Conjunctivae normal.  Neck:     Thyroid: No thyromegaly.  Cardiovascular:     Rate and Rhythm: Normal  rate and regular rhythm.     Pulses: Normal pulses.     Heart sounds: Normal heart sounds. No murmur heard. Pulmonary:     Effort: Pulmonary effort is normal. No respiratory distress.     Breath sounds: Normal breath sounds. No wheezing, rhonchi or rales.  Musculoskeletal:     Cervical back: Neck supple.     Right lower leg: No edema.     Left lower leg: No edema.  Lymphadenopathy:     Cervical: No cervical adenopathy.  Skin:    General: Skin is warm and dry.     Findings: No rash.  Neurological:     Mental Status: She is alert and oriented to person, place, and time. Mental status is at baseline.  Psychiatric:        Mood and Affect: Mood  normal.        Behavior: Behavior normal.     Diabetic Foot Exam - Simple   Simple Foot Form Diabetic Foot exam was performed with the following findings: Yes 08/10/2021  4:00 PM  Visual Inspection No deformities, no ulcerations, no other skin breakdown bilaterally: Yes Sensation Testing Intact to touch and monofilament testing bilaterally: Yes Pulse Check Posterior Tibialis and Dorsalis pulse intact bilaterally: Yes Comments      Results for orders placed or performed in visit on 08/10/21  POCT glycosylated hemoglobin (Hb A1C)  Result Value Ref Range   Hemoglobin A1C 11.3 (A) 4.0 - 5.6 %   Est. 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  Explained to her that this is because her body is currently acclimated to blood sugars in the 3 and 400s and that we need to slowly decrease this and improve it Her daughter will message in about 2 weeks to let me know if blood sugars are still running high and will increase Lantus to 20 units daily Follow-up in 3 months and repeat A1c      Relevant Orders   POCT glycosylated hemoglobin (Hb A1C) (Completed)   Urine Microalbumin w/creat. ratio   Hyperlipidemia associated with type 2 diabetes mellitus ([place])    Previously well controlled Continue statin Repeat FLP and CMP annually Goal LDL < 70       Relevant Medications   carvedilol (COREG) 12.5 MG tablet     Other   Obesity    Discussed importance of healthy weight management Discussed diet and exercise         Return in about 3 months (around 11/10/2021) for chronic disease f/u.      I, Lavon Paganini, MD, have reviewed all documentation for this visit. The documentation on 08/10/21 for the exam, diagnosis, procedures, and orders are all accurate and complete.   Ahlam Piscitelli, Dionne Bucy, MD, MPH [facility]

## 2021-08-10 NOTE — Telephone Encounter (Signed)
Brovana #[id number] sent to preferred pharmacy. ?Patient's daughter,  [name]) is aware and voiced her understanding.  ?Nothing further needed.   ? ?

## 2021-08-10 NOTE — Assessment & Plan Note (Signed)
Well controlled Continue current medications Recheck metabolic panel F/u in 6 months  

## 2021-08-10 NOTE — Assessment & Plan Note (Signed)
COPD/asthma overlap syndrome ?Treated by pulmonology ?Lungs are clear and she is doing well today ?She is going to try Mucinex as needed instead of daily ?Control is much better since switching from inhaler to nebulizer ?No longer on azithromycin chronically ?

## 2021-08-10 NOTE — Assessment & Plan Note (Signed)
Previously well controlled Continue statin Repeat FLP and CMP annually Goal LDL < 70  

## 2021-08-10 NOTE — Assessment & Plan Note (Signed)
Chronic and uncontrolled with hyperglycemia ?Continue current oral meds ?She is not taking her Lantus 15 units daily regularly, so we had a long discussion of the importance of this ?Upcoming eye exam ?Urine microalbumin to creatinine ratio ?Patient states that she feels bad and dizzy when her blood sugar gets below 200.  Explained to her that this is because her body is currently acclimated to blood sugars in the 3 and 400s and that we need to slowly decrease this and improve it ?Her daughter will message in about 2 weeks to let me know if blood sugars are still running high and will increase Lantus to 20 units daily ?Follow-up in 3 months and repeat A1c ?

## 2021-08-10 NOTE — Assessment & Plan Note (Signed)
Discussed importance of healthy weight management Discussed diet and exercise  

## 2021-08-11 LAB — MICROALBUMIN / CREATININE URINE RATIO
Creatinine, Urine: 33.7 mg/dL
Microalb/Creat Ratio: 29 mg/g creat (ref 0–29)
Microalbumin, Urine: 9.9 ug/mL

## 2021-08-15 ENCOUNTER — Other Ambulatory Visit: Payer: Self-pay | Admitting: Family Medicine

## 2021-08-15 NOTE — Telephone Encounter (Signed)
Copied from [place] [phone]. Topic: Quick Communication - Rx Refill/Question ?>> Aug 15, 2021 10:45 AM Tessa Lerner A wrote: ?Medication: carvedilol (COREG) 12.5 MG tablet [[id number]] - patient has lost their medication  ? ?Has the patient contacted their pharmacy? No. ?(Agent: If no, request that the patient contact the pharmacy for the refill. If patient does not wish to contact the pharmacy document the reason why and proceed with request.) ?(Agent: If yes, when and what did the pharmacy advise?) ? ?Preferred Pharmacy (with phone number or street name): [facility] [place], [place] ?[address] [place] [address] ?Phone: [phone] Fax: [phone] ?Hours: Not open 24 hours ? ? ?Has the patient been seen for an appointment in the last year OR does the patient have an upcoming appointment? Yes.   ? ?Agent: Please be advised that RX refills may take up to 3 business days. We ask that you follow-up with your pharmacy. ?

## 2021-08-16 NOTE — Telephone Encounter (Signed)
Requested medication (s) are due for refill today:   Yes ? ?Requested medication (s) are on the active medication list:   Yes ? ?Future visit scheduled:   Yes ? ? ?Last ordered: 08/10/2021 #180, 1 refill ? ?Returned because pt has lost their medication needing a new refill.     ? ?Requested Prescriptions  ?Pending Prescriptions Disp Refills  ? carvedilol (COREG) 12.5 MG tablet 180 tablet 1  ?  Sig: Take 1 tablet (12.5 mg total) by mouth 2 (two) times daily with a meal.  ?  ? Cardiovascular: Beta Blockers 3 Failed - 08/15/2021 11:19 AM  ?  ?  Failed - Cr in normal range and within 360 days  ?  Creatinine  ?Date Value Ref Range Status  ?12/26/2012 1.16 0.60 - 1.30 mg/dL Final  ? ?Creatinine, Ser  ?Date Value Ref Range Status  ?04/27/2021 1.11 (H) 0.57 - 1.00 mg/dL Final  ?  ?  ?  ?  Passed - AST in normal range and within 360 days  ?  AST  ?Date Value Ref Range Status  ?04/27/2021 10 0 - 40 IU/L Final  ?  ?  ?  ?  Passed - ALT in normal range and within 360 days  ?  ALT  ?Date Value Ref Range Status  ?04/27/2021 10 0 - 32 IU/L Final  ?  ?  ?  ?  Passed - Last BP in normal range  ?  BP Readings from Last 1 Encounters:  ?08/10/21 118/78  ?  ?  ?  ?  Passed - Last Heart Rate in normal range  ?  Pulse Readings from Last 1 Encounters:  ?08/10/21 82  ?  ?  ?  ?  Passed - Valid encounter within last 6 months  ?  Recent Outpatient Visits   ? ?      ? 6 days ago Type 2 diabetes mellitus with other circulatory complication, without long-term current use of insulin ([place])  ? [facility] [place], Dionne Bucy, MD  ? 3 months ago Type 2 diabetes mellitus with hyperglycemia, with long-term current use of insulin ([place])  ? [facility] [place], Dionne Bucy, MD  ? 9 months ago COPD exacerbation [facility])  ? [place], PA-C  ? 1 year ago Type 2 diabetes mellitus with hyperglycemia, without long-term current use of insulin ([place])  ? [facility] [place], Dionne Bucy, MD   ? 1 year ago Encounter for annual physical exam  ? [facility], Dionne Bucy, MD  ? ?  ?  ?Future Appointments   ? ?        ? In 3 months Bac[place], Dionne Bucy, MD [facility]  ? ?  ? ?  ?  ?  ? ?

## 2021-09-05 ENCOUNTER — Telehealth: Payer: Self-pay | Admitting: Internal Medicine

## 2021-09-05 DIAGNOSIS — J4550 Severe persistent asthma, uncomplicated: Secondary | ICD-10-CM

## 2021-09-05 MED ORDER — ARFORMOTEROL TARTRATE 15 MCG/2ML IN NEBU: 15.0000 ug | INHALATION_SOLUTION | Freq: Two times a day (BID) | RESPIRATORY_TRACT | 2 refills | Status: DC

## 2021-09-05 NOTE — Telephone Encounter (Signed)
Brovana sent to preferred pharmacy. ?Patient's daughter is aware and voiced her understanding.  ? ?

## 2021-09-14 ENCOUNTER — Other Ambulatory Visit: Payer: Self-pay | Admitting: Family Medicine

## 2021-09-18 ENCOUNTER — Telehealth: Payer: Self-pay

## 2021-09-18 NOTE — Telephone Encounter (Signed)
Copied from [place] [phone]. Topic: Quick Communication - Rx Refill/Question ?>> Sep 18, 2021  3:55 PM Pawlus, Brayton Layman A wrote: ?Caller wanted to know if an order / prescription could be sent in for adult diapers to [facility], please advise. ?

## 2021-09-18 NOTE — Telephone Encounter (Signed)
Insurance typically requires a face to face visit for new DME equipment.  We could have that and order at our next visit or she could do in person or virtual visit in the meantime (with any provider if I'm not available) to get the necessary documentation and send order.

## 2021-09-20 NOTE — Telephone Encounter (Signed)
Whitney Fleming reports they will wait until appt in June.  ?

## 2021-10-25 ENCOUNTER — Other Ambulatory Visit: Payer: Self-pay | Admitting: Family Medicine

## 2021-10-29 ENCOUNTER — Other Ambulatory Visit: Payer: Self-pay | Admitting: Family Medicine

## 2021-11-01 ENCOUNTER — Ambulatory Visit: Payer: Medicaid Other | Admitting: Family Medicine

## 2021-11-01 DIAGNOSIS — E782 Mixed hyperlipidemia: Secondary | ICD-10-CM | POA: Diagnosis not present

## 2021-11-01 DIAGNOSIS — N1832 Chronic kidney disease, stage 3b: Secondary | ICD-10-CM | POA: Diagnosis not present

## 2021-11-01 DIAGNOSIS — E875 Hyperkalemia: Secondary | ICD-10-CM | POA: Diagnosis not present

## 2021-11-01 DIAGNOSIS — E1122 Type 2 diabetes mellitus with diabetic chronic kidney disease: Secondary | ICD-10-CM | POA: Diagnosis not present

## 2021-11-01 DIAGNOSIS — E1169 Type 2 diabetes mellitus with other specified complication: Secondary | ICD-10-CM | POA: Diagnosis not present

## 2021-11-01 DIAGNOSIS — I10 Essential (primary) hypertension: Secondary | ICD-10-CM | POA: Diagnosis not present

## 2021-11-01 DIAGNOSIS — R809 Proteinuria, unspecified: Secondary | ICD-10-CM | POA: Diagnosis not present

## 2021-11-01 DIAGNOSIS — E119 Type 2 diabetes mellitus without complications: Secondary | ICD-10-CM | POA: Diagnosis not present

## 2021-11-01 NOTE — Progress Notes (Deleted)
     I,Gearldine Looney S Bodhi Stenglein,acting as a Education administrator for Gwyneth Sprout, FNP.,have documented all relevant documentation on the behalf of Gwyneth Sprout, FNP,as directed by  Gwyneth Sprout, FNP while in the presence of Gwyneth Sprout, FNP.   Established patient visit   Patient: Whitney Fleming   DOB: 1946-01-24   76 y.o. Female  MRN: [id number] Visit Date: 11/01/2021  Today's healthcare provider: Gwyneth Sprout, FNP   No chief complaint on file.  Subjective    HPI  Patient here today requesting order for adult diapers.   Medications: Outpatient Medications Prior to Visit  Medication Sig   Accu-Chek FastClix Lancets MISC Use as directed to check blood glucose twice daily   ACCU-CHEK GUIDE test strip USE AS DIRECTED TWICE DAILY   albuterol (PROVENTIL) (2.5 MG/3ML) 0.083% nebulizer solution Take 3 mLs (2.5 mg total) by nebulization every 6 (six) hours as needed for wheezing or shortness of breath.   albuterol (VENTOLIN HFA) 108 (90 Base) MCG/ACT inhaler Inhale 2 puffs into the lungs every 6 (six) hours as needed for wheezing or shortness of breath.   AMBULATORY NON FORMULARY MEDICATION Medication Name: Aero chamber use as directed. DX:J45.909   amLODipine (NORVASC) 5 MG tablet Take 5 mg by mouth daily.   arformoterol (BROVANA) 15 MCG/2ML NEBU Take 2 mLs (15 mcg total) by nebulization in the morning and at bedtime.   aspirin EC 81 MG tablet Take 1 tablet (81 mg total) by mouth daily. Swallow whole.   budesonide (PULMICORT) 0.5 MG/2ML nebulizer solution Take 2 mLs (0.5 mg total) by nebulization 2 (two) times daily.   carvedilol (COREG) 12.5 MG tablet Take 1 tablet (12.5 mg total) by mouth 2 (two) times daily with a meal.   empagliflozin (JARDIANCE) 25 MG TABS tablet Take 1 tablet (25 mg total) by mouth daily.   guaiFENesin (MUCINEX) 600 MG 12 hr tablet Take 1 tablet (600 mg total) by mouth 2 (two) times daily.   insulin glargine (LANTUS SOLOSTAR) 100 UNIT/ML Solostar Pen Inject 15 Units into the skin  daily for 7 days. Decrease to 10 units if sugars less then 200   Insulin Pen Needle (NOVOFINE) 32G X 6 MM MISC Use as directed to inject insulin daily   JANUVIA 100 MG tablet Take 1 tablet by mouth once daily   metFORMIN (GLUCOPHAGE) 1000 MG tablet TAKE 1 TABLET BY MOUTH TWICE DAILY WITH A MEAL   montelukast (SINGULAIR) 10 MG tablet TAKE 1 TABLET BY MOUTH AT BEDTIME   rosuvastatin (CRESTOR) 5 MG tablet Take 1 tablet by mouth once daily   No facility-administered medications prior to visit.    Review of Systems      Objective    There were no vitals taken for this visit. BP Readings from Last 3 Encounters:  08/10/21 118/78  05/18/21 130/80  04/27/21 120/60   Wt Readings from Last 3 Encounters:  08/10/21 158 lb 1.6 oz (71.7 kg)  05/18/21 160 lb (72.6 kg)  01/16/21 160 lb (72.6 kg)      Physical Exam  ***  No results found for any visits on 11/01/21.  Assessment & Plan     ***  No follow-ups on file.      {provider attestation***:1}   Gwyneth Sprout, [place] [phone] (phone) [phone] (fax)  [place]

## 2021-11-15 ENCOUNTER — Ambulatory Visit (INDEPENDENT_AMBULATORY_CARE_PROVIDER_SITE_OTHER): Payer: Medicaid Other | Admitting: Internal Medicine

## 2021-11-15 ENCOUNTER — Encounter: Payer: Self-pay | Admitting: Internal Medicine

## 2021-11-15 VITALS — BP 100/50 | HR 85 | Temp 97.7°F | Ht 60.0 in | Wt 154.0 lb

## 2021-11-15 DIAGNOSIS — J449 Chronic obstructive pulmonary disease, unspecified: Secondary | ICD-10-CM

## 2021-11-15 DIAGNOSIS — J4550 Severe persistent asthma, uncomplicated: Secondary | ICD-10-CM

## 2021-11-15 MED ORDER — BUDESONIDE 0.5 MG/2ML IN SUSP: 0.5000 mg | Freq: Two times a day (BID) | RESPIRATORY_TRACT | 2 refills | Status: DC

## 2021-11-15 MED ORDER — ARFORMOTEROL TARTRATE 15 MCG/2ML IN NEBU: 15.0000 ug | INHALATION_SOLUTION | Freq: Two times a day (BID) | RESPIRATORY_TRACT | 2 refills | Status: DC

## 2021-11-15 NOTE — Progress Notes (Signed)
* [facility]  Date: 11/15/2021  MRN# [id number] Whitney Fleming 02-21-1946  Whitney Fleming is a 76 y.o. old female seen in follow up for chief complaint of dyspnea.   SYNOPSIS Whitney Fleming is a 76 y.o. female  with severe allergic asthma.  She speaks [place] her daughter translates.  She has had 3 admissions to the hospital in 2019 with asthma exacerbations including most recently in September 2019.  She had not had further hospital admissions thus far this year but required 2 courses of prednisone in May 2020 which caused her blood sugar to spike.  She has not been very compliant with her medications, she has a Symbicort inhaler, however at her last visit she noted that it had run out and she had not bothered refilling it subsequently her asthma became worse.  At last visit she was strongly advised to make sure that she uses her Symbicort inhaler daily as instructed, use albuterol inhaler as needed.  She continues to rely on and asked for courses of prednisone, however we have instructed her that this is only a rescue medication and has several deleterious effects therefore she should be maintained on her regular medications and use prednisone only for exacerbations.  She was asked to continue azithromycin 2 and 50 mg 5 days/week.  She previously had been evaluated for an injectable biologic agent for asthma but declined because she did not want injections.   At last visit she was reminded to use Symbicort inhaler 2 puffs twice daily, in addition to her Xopenex nebulizer.  We previously considered her for Nucala but declined anything that involve injections.  She was therefore maintained on azithromycin 5 times weekly. Patient is noted to have her breathing getting worse, and they were worried that she had to go to ED, she went to see her physician the next day and received prednisone which helped.   She has now started taking symbicort 2 puffs twice per day and rinses per day. She is  taking singulair, azithro 5 days per week. She is off the prednisone and doing well with that. She is using rescue inhaler she is using 2 times per day, pulmicort nebs twice per day.    **IgE 12/24/17; IGe 5.  **CXR 11/05/17; changes of chronic bronchitis, right heart enlargement.  **CBC 02/11/18>> absolute eosinophil count 800. **CBC 09/25/17; Abs eos count 300.    CC Follow-up asthma   HPI Chronic shortness of breath and dyspnea on exertion has significantly improved with bronchodilator therapy with nebulizers   No longer uses traditional inhaler therapy Previously on nebulized therapy but has not been using it over the last 6 months  Most of her history is provided by her daughter   ONO WNL  No exacerbation at this time No evidence of heart failure at this time No evidence or signs of infection at this time No respiratory distress No fevers, chills, nausea, vomiting, diarrhea No evidence of lower extremity edema No evidence hemoptysis   Medication:    Current Outpatient Medications:    Accu-Chek FastClix Lancets MISC, Use as directed to check blood glucose twice daily, Disp: 100 each, Rfl: 1   ACCU-CHEK GUIDE test strip, USE AS DIRECTED TWICE DAILY, Disp: 100 each, Rfl: 2   albuterol (PROVENTIL) (2.5 MG/3ML) 0.083% nebulizer solution, Take 3 mLs (2.5 mg total) by nebulization every 6 (six) hours as needed for wheezing or shortness of breath., Disp: 1008 mL, Rfl: 1   albuterol (VENTOLIN HFA) 108 (90 Base) MCG/ACT inhaler,  Inhale 2 puffs into the lungs every 6 (six) hours as needed for wheezing or shortness of breath., Disp: 8 g, Rfl: 2   AMBULATORY NON FORMULARY MEDICATION, Medication Name: Aero chamber use as directed. DX:J45.909, Disp: 1 each, Rfl: 0   amLODipine (NORVASC) 5 MG tablet, Take 5 mg by mouth daily., Disp: , Rfl:    arformoterol (BROVANA) 15 MCG/2ML NEBU, Take 2 mLs (15 mcg total) by nebulization in the morning and at bedtime., Disp: 360 mL, Rfl: 2   aspirin EC  81 MG tablet, Take 1 tablet (81 mg total) by mouth daily. Swallow whole., Disp: 90 tablet, Rfl: 3   budesonide (PULMICORT) 0.5 MG/2ML nebulizer solution, Take 2 mLs (0.5 mg total) by nebulization 2 (two) times daily., Disp: 1440 mL, Rfl: 0   carvedilol (COREG) 12.5 MG tablet, Take 1 tablet (12.5 mg total) by mouth 2 (two) times daily with a meal., Disp: 180 tablet, Rfl: 1   empagliflozin (JARDIANCE) 25 MG TABS tablet, Take 1 tablet (25 mg total) by mouth daily., Disp: 90 tablet, Rfl: 1   guaiFENesin (MUCINEX) 600 MG 12 hr tablet, Take 1 tablet (600 mg total) by mouth 2 (two) times daily., Disp: 60 tablet, Rfl: 0   Insulin Pen Needle (NOVOFINE) 32G X 6 MM MISC, Use as directed to inject insulin daily, Disp: 100 each, Rfl: 2   JANUVIA 100 MG tablet, Take 1 tablet by mouth once daily, Disp: 90 tablet, Rfl: 0   metFORMIN (GLUCOPHAGE) 1000 MG tablet, TAKE 1 TABLET BY MOUTH TWICE DAILY WITH A MEAL, Disp: 180 tablet, Rfl: 0   montelukast (SINGULAIR) 10 MG tablet, TAKE 1 TABLET BY MOUTH AT BEDTIME, Disp: 90 tablet, Rfl: 0   rosuvastatin (CRESTOR) 5 MG tablet, Take 1 tablet by mouth once daily, Disp: 90 tablet, Rfl: 1   insulin glargine (LANTUS SOLOSTAR) 100 UNIT/ML Solostar Pen, Inject 15 Units into the skin daily for 7 days. Decrease to 10 units if sugars less then 200, Disp: 15 mL, Rfl: 0   Allergies:  Patient has no known allergies.       Review of Systems: Gen:  Denies  fever, sweats, chills weight loss  HEENT: Denies blurred vision, double vision, ear pain, eye pain, hearing loss, nose bleeds, sore throat Cardiac:  No dizziness, chest pain or heaviness, chest tightness,edema, No JVD Resp:   No cough, -sputum production, -shortness of breath,-wheezing, -hemoptysis,  Other:  All other systems negative  BP (!) 100/50 (BP Location: Left Arm, Cuff Size: Normal)   Pulse 85   Temp 97.7 F (36.5 C) (Temporal)   Ht 5' (1.524 m)   Wt 154 lb (69.9 kg)   SpO2 97%   BMI 30.08 kg/m    Physical  Examination:   General Appearance: No distress  EYES PERRLA, EOM intact.   NECK Supple, No JVD Pulmonary: normal breath sounds, No wheezing.  CardiovascularNormal S1,S2.  No m/r/g.   ALL OTHER ROS ARE NEGATIVE    Assessment and Plan:   76 year old [place] female with chronic severe persistent asthma currently on Pulmicort and Brovana nebulizers due to the fact that she has respiratory insufficiency and can no longer take traditional inhaler therapy with chronic severe persistent asthma in the setting of coronary artery disease,  Nebulized therapy has significantly improved her chronic severe persistent asthma Pulmicort twice daily Brovana twice daily  Dyspnea exertion and shortness of breath related to her obesity underlying lung disease with undiagnosed CAD diastolic heart failure Follow-up with cardiology as controlled Overall condition  has improved with time and nebulized therapy    MEDICATION ADJUSTMENTS/LABS AND TESTS ORDERED: Pulmicort nebulizer 2 times per day Brovana  2 times per day ALBUTEROL NEBULIZER every 4 hrs as needed   CURRENT MEDICATIONS REVIEWED AT LENGTH WITH PATIENT TODAY   Follow-up in 6 months  Total time spent 21 minutes   Kanya Potteiger Patricia Pesa, M.D.  [facility]  Medical Director [place] Director [facility] Cardio-Pulmonary Department

## 2021-11-15 NOTE — Patient Instructions (Signed)
Continue nebulizers as prescribed Avoid sick contacts Avoid secondhand smoke

## 2021-12-02 ENCOUNTER — Other Ambulatory Visit: Payer: Self-pay | Admitting: Family Medicine

## 2021-12-04 ENCOUNTER — Encounter: Payer: Medicaid Other | Admitting: Family Medicine

## 2021-12-04 ENCOUNTER — Other Ambulatory Visit: Payer: Self-pay | Admitting: Family Medicine

## 2021-12-11 ENCOUNTER — Ambulatory Visit (INDEPENDENT_AMBULATORY_CARE_PROVIDER_SITE_OTHER): Payer: Medicaid Other | Admitting: Family Medicine

## 2021-12-11 ENCOUNTER — Encounter: Payer: Self-pay | Admitting: Family Medicine

## 2021-12-11 VITALS — BP 124/49 | HR 90 | Temp 97.9°F | Resp 16 | Wt 153.1 lb

## 2021-12-11 DIAGNOSIS — E1159 Type 2 diabetes mellitus with other circulatory complications: Secondary | ICD-10-CM

## 2021-12-11 DIAGNOSIS — H9193 Unspecified hearing loss, bilateral: Secondary | ICD-10-CM

## 2021-12-11 DIAGNOSIS — Z0000 Encounter for general adult medical examination without abnormal findings: Secondary | ICD-10-CM | POA: Diagnosis not present

## 2021-12-11 DIAGNOSIS — Z23 Encounter for immunization: Secondary | ICD-10-CM

## 2021-12-11 DIAGNOSIS — E1169 Type 2 diabetes mellitus with other specified complication: Secondary | ICD-10-CM

## 2021-12-11 DIAGNOSIS — E785 Hyperlipidemia, unspecified: Secondary | ICD-10-CM | POA: Diagnosis not present

## 2021-12-11 DIAGNOSIS — E559 Vitamin D deficiency, unspecified: Secondary | ICD-10-CM | POA: Diagnosis not present

## 2021-12-11 DIAGNOSIS — Z6831 Body mass index (BMI) 31.0-31.9, adult: Secondary | ICD-10-CM | POA: Diagnosis not present

## 2021-12-11 DIAGNOSIS — E669 Obesity, unspecified: Secondary | ICD-10-CM

## 2021-12-11 DIAGNOSIS — I152 Hypertension secondary to endocrine disorders: Secondary | ICD-10-CM

## 2021-12-11 NOTE — Patient Instructions (Addendum)
It was great to see you!  Our plans for today:  - Increase lantus to 20 units once daily. - Someone will call you about an appointment for your hearing.  We are checking some labs today, we will release these results to your MyChart.  Take care and seek immediate care sooner if you develop any concerns.   Dr. Ky Barban  Things to do to keep yourself healthy  - Exercise at least 30-45 minutes a day, 3-4 days a week.  - Eat a low-fat diet with lots of fruits and vegetables, up to 7-9 servings per day.  - Seatbelts can save your life. Wear them always.  - Smoke detectors on every level of your home, check batteries every year.  - Eye Doctor - have an eye exam every 1-2 years  - Safe sex - if you may be exposed to STDs, use a condom.  - Alcohol -  If you drink, do it moderately, less than 2 drinks per day.  - [place]. Choose someone to speak for you if you are not able. https://www.prepareforyourcare.org is a great website to help you navigate this. - Depression is common in our stressful world.If you're feeling down or losing interest in things you normally enjoy, please come in for a visit.  - Violence - If anyone is threatening or hurting you, please call immediately.   Diet Recommendations for Diabetes   1. Eat at least 3 meals and 1-2 snacks per day. Never go more than 4-5 hours while awake without eating. Eat breakfast within the first hour of getting up.   2. Limit starchy foods to TWO per meal and ONE per snack. ONE portion of a starchy  food is equal to the following:   - ONE slice of bread (or its equivalent, such as half of a hamburger bun).   - 1/2 cup of a "scoopable" starchy food such as potatoes or rice.   - 15 grams of Total Carbohydrate as shown on food label.  3. Include at every meal: a protein food, a carb food, and vegetables and/or fruit.   - Obtain twice the volume of vegetables as protein or carbohydrate foods for both lunch and dinner.   -  Fresh or frozen vegetables are best.   - Keep frozen vegetables on hand for a quick vegetable serving.       Starchy (carb) foods: Bread, rice, pasta, potatoes, corn, cereal, grits, crackers, bagels, muffins, all baked goods.  (Fruits, milk, and yogurt also have carbohydrate, but most of these foods will not spike your blood sugar as most starchy foods will.)  A few fruits do cause high blood sugars; use small portions of bananas (limit to 1/2 at a time), grapes, watermelon, oranges, and most tropical fruits.    Protein foods: Meat, fish, poultry, eggs, dairy foods, and beans such as pinto and kidney beans (beans also provide carbohydrate).

## 2021-12-11 NOTE — Assessment & Plan Note (Signed)
Discussed diet and exercise 

## 2021-12-11 NOTE — Progress Notes (Deleted)
Complete physical exam   Patient: Whitney Fleming   DOB: [birth year]   76 y.o. Female  MRN: [id number] Visit Date: 12/11/2021  Today's healthcare provider: Myles Gip, DO   I,Tiffany J Bragg,acting as a scribe for Myles Gip, DO.,have documented all relevant documentation on the behalf of Myles Gip, DO,as directed by  Myles Gip, DO while in the presence of Myles Gip, DO.   Chief Complaint  Patient presents with   Annual Exam   Subjective    Whitney Fleming is a 76 y.o. female who presents today for a complete physical exam.  She reports consuming a {diet types:17450} diet. {Exercise:19826} She generally feels {well/fairly well/poorly:18703}. She reports sleeping {well/fairly well/poorly:18703}. She {does/does not:200015} have additional problems to discuss today.  HPI HPI   Patient's daughter states she has had trouble hearing lately. And she wants her to be assessed for possible nursing home admission.  Last edited by Smitty Knudsen, CMA on 12/11/2021  2:01 PM.      ***  Past Medical History:  Diagnosis Date   Anemia    normocytic anemia   Asthma    Cardiomegaly    COPD (chronic obstructive pulmonary disease) (HCC)    Diabetes mellitus without complication ([facility])    Hypertension    Hyponatremia    Past Surgical History:  Procedure Laterality Date   TUBAL LIGATION     Social History   Socioeconomic History   Marital status: Married    Spouse name: Not on file   Number of children: 4   Years of education: Not on file   Highest education level: Not on file  Occupational History   Occupation: homemaker  Tobacco Use   Smoking status: Never   Smokeless tobacco: Never  Vaping Use   Vaping Use: Never used  Substance and Sexual Activity   Alcohol use: Never   Drug use: Never   Sexual activity: Not Currently  Other Topics Concern   Not on file  Social History Narrative   Not on file   Social Determinants of Health   Financial  Resource Strain: Not on file  Food Insecurity: Not on file  Transportation Needs: Not on file  Physical Activity: Not on file  Stress: Not on file  Social Connections: Not on file  Intimate Partner Violence: Not on file   Family Status  Relation Name Status   Other  (Not Specified)   Mother  Deceased   Father  Deceased   Neg Hx  (Not Specified)   Family History  Problem Relation Age of Onset   Tuberculosis Other    Healthy Mother    Heart disease Father    Asthma Father    Cancer Neg Hx    No Known Allergies  Patient Care Team: [name], MD as PCP - General (Family Medicine)   Medications: Outpatient Medications Prior to Visit  Medication Sig   Accu-Chek FastClix Lancets MISC Use as directed to check blood glucose twice daily   albuterol (PROVENTIL) (2.5 MG/3ML) 0.083% nebulizer solution Take 3 mLs (2.5 mg total) by nebulization every 6 (six) hours as needed for wheezing or shortness of breath.   albuterol (VENTOLIN HFA) 108 (90 Base) MCG/ACT inhaler Inhale 2 puffs into the lungs every 6 (six) hours as needed for wheezing or shortness of breath.   AMBULATORY NON FORMULARY MEDICATION Medication Name: Aero chamber use as directed. DX:J45.909   amLODipine (NORVASC) 5 MG tablet Take 5 mg  by mouth daily.   arformoterol (BROVANA) 15 MCG/2ML NEBU Take 2 mLs (15 mcg total) by nebulization in the morning and at bedtime.   aspirin EC 81 MG tablet Take 1 tablet (81 mg total) by mouth daily. Swallow whole.   budesonide (PULMICORT) 0.5 MG/2ML nebulizer solution Take 2 mLs (0.5 mg total) by nebulization 2 (two) times daily.   carvedilol (COREG) 12.5 MG tablet Take 1 tablet (12.5 mg total) by mouth 2 (two) times daily with a meal.   empagliflozin (JARDIANCE) 25 MG TABS tablet Take 1 tablet (25 mg total) by mouth daily.   glucose blood (ACCU-CHEK GUIDE) test strip USE  STRIP TO CHECK GLUCOSE TWICE DAILY AS DIRECTED   guaiFENesin (MUCINEX) 600 MG 12 hr tablet Take 1 tablet (600  mg total) by mouth 2 (two) times daily.   Insulin Pen Needle (NOVOFINE) 32G X 6 MM MISC Use as directed to inject insulin daily   JANUVIA 100 MG tablet Take 1 tablet by mouth once daily   metFORMIN (GLUCOPHAGE) 1000 MG tablet TAKE 1 TABLET BY MOUTH TWICE DAILY WITH A MEAL   montelukast (SINGULAIR) 10 MG tablet TAKE 1 TABLET BY MOUTH AT BEDTIME   rosuvastatin (CRESTOR) 5 MG tablet Take 1 tablet by mouth once daily   insulin glargine (LANTUS SOLOSTAR) 100 UNIT/ML Solostar Pen Inject 15 Units into the skin daily for 7 days. Decrease to 10 units if sugars less then 200   No facility-administered medications prior to visit.    Review of Systems  {Labs  Heme  Chem  Endocrine  Serology  Results Review (optional):23779}  Objective     BP (!) 124/49 (BP Location: Left Arm, Patient Position: Sitting, Cuff Size: Normal)   Pulse 90   Temp 97.9 F (36.6 C) (Oral)   Resp 16   Wt 153 lb 1.6 oz (69.4 kg)   SpO2 100%   BMI 29.90 kg/m  {Show previous vital signs (optional):23777}   Physical Exam  ***  Last depression screening scores    04/27/2021    3:35 PM 08/02/2020    4:13 PM 04/28/2020    2:18 PM  PHQ 2/9 Scores  PHQ - 2 Score 0 0 0  PHQ- 9 Score '1 3 6   '$ Last fall risk screening    04/27/2021    3:35 PM  [place] in the past year? 0  Number falls in past yr: 0  Injury with Fall? 0  Follow up Falls evaluation completed   Last Audit-C alcohol use screening    04/27/2021    3:34 PM  Alcohol Use Disorder Test (AUDIT)  1. How often do you have a drink containing alcohol? 0  2. How many drinks containing alcohol do you have on a typical day when you are drinking? 0  3. How often do you have six or more drinks on one occasion? 0  AUDIT-C Score 0   A score of 3 or more in women, and 4 or more in men indicates increased risk for alcohol abuse, EXCEPT if all of the points are from question 1   No results found for any visits on 12/11/21.  Assessment & Plan     Routine Health Maintenance and Physical Exam  Exercise Activities and Dietary recommendations  Goals   None     Immunization History  Administered Date(s) Administered   Fluad Quad(high Dose 65+) 04/27/2021   Influenza, High Dose Seasonal PF 02/25/2018   PFIZER(Purple Top)SARS-COV-2 Vaccination 07/17/2019, 08/06/2019, 06/01/2020  Pneumococcal Conjugate-13 04/28/2020   Pneumococcal Polysaccharide-23 01/14/2018    Health Maintenance  Topic Date Due   Zoster Vaccines- Shingrix (1 of 2) Never done   DEXA SCAN  Never done   COVID-19 Vaccine (4 - Booster for Pfizer series) 07/27/2020   OPHTHALMOLOGY EXAM  10/25/2020   TETANUS/TDAP  03/04/2023 (Originally 05/12/1965)   INFLUENZA VACCINE  01/09/2022   HEMOGLOBIN A1C  02/10/2022   FOOT EXAM  08/11/2022   Pneumonia Vaccine 27+ Years old  Completed   Hepatitis C Screening  Completed   HPV VACCINES  Aged Out    Discussed health benefits of physical activity, and encouraged her to engage in regular exercise appropriate for her age and condition.  ***  No follow-ups on file.     {provider attestation***:1}   Myles Gip, DO  [facility] [phone] (phone) [phone] (fax)  [place]

## 2021-12-11 NOTE — Assessment & Plan Note (Signed)
CBGs still not well controlled. Recommend increasing Lantus to 20u daily. Obtaining a1c today. F/u in 3 months.

## 2021-12-11 NOTE — Assessment & Plan Note (Signed)
Doing well on current regimen, no changes made today. 

## 2021-12-11 NOTE — Progress Notes (Signed)
BP (!) 124/49 (BP Location: Left Arm, Patient Position: Sitting, Cuff Size: Normal)   Pulse 90   Temp 97.9 F (36.6 C) (Oral)   Resp 16   Wt 153 lb 1.6 oz (69.4 kg)   SpO2 100%   BMI 29.90 kg/m    Subjective:    Patient ID: Whitney Fleming, female    DOB: [birth year], 76 y.o.   MRN: [id number]  HPI: Whitney Fleming is a 76 y.o. female presenting on 12/11/2021 for comprehensive medical examination. Current medical complaints include: wants referral for hearing aids.   Diabetes, Type 2 - Last A1c 11.3 08/2021 - Medications: januvia, metformin, lantus 15u daily, jardiance - Compliance: good - Checking BG at home: yes, CBGs 270-300. - Diet: oatmeal, vegetables, quinoa - Exercise: none, hard d/t COPD - Eye exam: last week, [facility] - Foot exam: UTD - Statin: yes - PNA vaccine: UTD - Denies symptoms of hypoglycemia, polyuria, polydipsia, numbness extremities, foot ulcers/trauma   She currently lives with: daughter  Depression Screen done today and results listed below:     04/27/2021    3:35 PM 08/02/2020    4:13 PM 04/28/2020    2:18 PM 12/19/2017    3:07 PM  Depression screen PHQ 2/9  Decreased Interest 0 0 0 0  Down, Depressed, Hopeless 0 0 0 0  PHQ - 2 Score 0 0 0 0  Altered sleeping 0 3 2   Tired, decreased energy 1 0 2   Change in appetite 0 0 2   Feeling bad or failure about yourself  0 0 0   Trouble concentrating 0 0 0   Moving slowly or fidgety/restless 0 0 0   Suicidal thoughts 0 0 0   PHQ-9 Score '1 3 6   '$ Difficult doing work/chores Not difficult at all Not difficult at all Not difficult at all     The patient does not have a history of falls.   Past Medical History:  Past Medical History:  Diagnosis Date   Anemia    normocytic anemia   Asthma    Cardiomegaly    COPD (chronic obstructive pulmonary disease) (HCC)    Diabetes mellitus without complication ([place])    Hypertension    Hyponatremia     Surgical History:  Past Surgical History:   Procedure Laterality Date   TUBAL LIGATION      Medications:  Current Outpatient Medications on File Prior to Visit  Medication Sig   Accu-Chek FastClix Lancets MISC Use as directed to check blood glucose twice daily   albuterol (PROVENTIL) (2.5 MG/3ML) 0.083% nebulizer solution Take 3 mLs (2.5 mg total) by nebulization every 6 (six) hours as needed for wheezing or shortness of breath.   albuterol (VENTOLIN HFA) 108 (90 Base) MCG/ACT inhaler Inhale 2 puffs into the lungs every 6 (six) hours as needed for wheezing or shortness of breath.   AMBULATORY NON FORMULARY MEDICATION Medication Name: Aero chamber use as directed. DX:J45.909   amLODipine (NORVASC) 5 MG tablet Take 5 mg by mouth daily.   arformoterol (BROVANA) 15 MCG/2ML NEBU Take 2 mLs (15 mcg total) by nebulization in the morning and at bedtime.   aspirin EC 81 MG tablet Take 1 tablet (81 mg total) by mouth daily. Swallow whole.   budesonide (PULMICORT) 0.5 MG/2ML nebulizer solution Take 2 mLs (0.5 mg total) by nebulization 2 (two) times daily.   carvedilol (COREG) 12.5 MG tablet Take 1 tablet (12.5 mg total) by mouth 2 (two) times daily with a  meal.   empagliflozin (JARDIANCE) 25 MG TABS tablet Take 1 tablet (25 mg total) by mouth daily.   glucose blood (ACCU-CHEK GUIDE) test strip USE  STRIP TO CHECK GLUCOSE TWICE DAILY AS DIRECTED   guaiFENesin (MUCINEX) 600 MG 12 hr tablet Take 1 tablet (600 mg total) by mouth 2 (two) times daily.   Insulin Pen Needle (NOVOFINE) 32G X 6 MM MISC Use as directed to inject insulin daily   JANUVIA 100 MG tablet Take 1 tablet by mouth once daily   metFORMIN (GLUCOPHAGE) 1000 MG tablet TAKE 1 TABLET BY MOUTH TWICE DAILY WITH A MEAL   montelukast (SINGULAIR) 10 MG tablet TAKE 1 TABLET BY MOUTH AT BEDTIME   rosuvastatin (CRESTOR) 5 MG tablet Take 1 tablet by mouth once daily   insulin glargine (LANTUS SOLOSTAR) 100 UNIT/ML Solostar Pen Inject 15 Units into the skin daily for 7 days. Decrease to 10 units  if sugars less then 200   No current facility-administered medications on file prior to visit.    Allergies:  No Known Allergies  Social History:  Social History   Socioeconomic History   Marital status: Married    Spouse name: Not on file   Number of children: 4   Years of education: Not on file   Highest education level: Not on file  Occupational History   Occupation: homemaker  Tobacco Use   Smoking status: Never   Smokeless tobacco: Never  Vaping Use   Vaping Use: Never used  Substance and Sexual Activity   Alcohol use: Never   Drug use: Never   Sexual activity: Not Currently  Other Topics Concern   Not on file  Social History Narrative   Not on file   Social Determinants of Health   Financial Resource Strain: Not on file  Food Insecurity: Not on file  Transportation Needs: Not on file  Physical Activity: Not on file  Stress: Not on file  Social Connections: Not on file  Intimate Partner Violence: Not on file   Social History   Tobacco Use  Smoking Status Never  Smokeless Tobacco Never   Social History   Substance and Sexual Activity  Alcohol Use Never    Family History:  Family History  Problem Relation Age of Onset   Tuberculosis Other    Healthy Mother    Heart disease Father    Asthma Father    Cancer Neg Hx     Past medical history, surgical history, medications, allergies, family history and social history reviewed with patient today and changes made to appropriate areas of the chart.      Objective:    BP (!) 124/49 (BP Location: Left Arm, Patient Position: Sitting, Cuff Size: Normal)   Pulse 90   Temp 97.9 F (36.6 C) (Oral)   Resp 16   Wt 153 lb 1.6 oz (69.4 kg)   SpO2 100%   BMI 29.90 kg/m   Wt Readings from Last 3 Encounters:  12/11/21 153 lb 1.6 oz (69.4 kg)  11/15/21 154 lb (69.9 kg)  08/10/21 158 lb 1.6 oz (71.7 kg)    Physical Exam Vitals reviewed.  Constitutional:      General: She is not in acute  distress. HENT:     Head: Normocephalic and atraumatic.     Right Ear: External ear normal.     Left Ear: External ear normal.     Nose: Nose normal.     Mouth/Throat:     Mouth: Mucous membranes are moist.  Pharynx: Oropharynx is clear.  Eyes:     Extraocular Movements: Extraocular movements intact.  Cardiovascular:     Rate and Rhythm: Normal rate and regular rhythm.     Heart sounds: Normal heart sounds. No murmur heard. Pulmonary:     Effort: Pulmonary effort is normal.     Breath sounds: No wheezing or rhonchi.     Comments: Breath sounds diminished. Fine crackles in LLL clear with cough. Abdominal:     General: Bowel sounds are normal.     Palpations: Abdomen is soft.     Tenderness: There is no abdominal tenderness.  Musculoskeletal:     Right lower leg: No edema.     Left lower leg: No edema.  Skin:    General: Skin is warm and dry.  Neurological:     Mental Status: She is alert and oriented to person, place, and time. Mental status is at baseline.  Psychiatric:        Mood and Affect: Mood normal.        Behavior: Behavior normal.     Results for orders placed or performed in visit on 08/10/21  Urine Microalbumin w/creat. ratio  Result Value Ref Range   Creatinine, Urine 33.7 Not Estab. mg/dL   Microalbumin, Urine 9.9 Not Estab. ug/mL   Microalb/Creat Ratio 29 0 - 29 mg/g creat  POCT glycosylated hemoglobin (Hb A1C)  Result Value Ref Range   Hemoglobin A1C 11.3 (A) 4.0 - 5.6 %   Est. average glucose Bld gHb Est-mCnc 278       Assessment & Plan:   Problem List Items Addressed This Visit       Cardiovascular and Mediastinum   Hypertension associated with diabetes ([place])    Doing well on current regimen, no changes made today.      Relevant Orders   Comprehensive metabolic panel     Endocrine   Diabetes ([place])    CBGs still not well controlled. Recommend increasing Lantus to 20u daily. Obtaining a1c today. F/u in 3 months.      Relevant Orders    Comprehensive metabolic panel   Lipid panel   Hemoglobin A1c   Hyperlipidemia associated with type 2 diabetes mellitus ([place])   Relevant Orders   Comprehensive metabolic panel   Lipid panel     Other   Hearing difficulty of both ears    Refer to audiology      Relevant Orders   Ambulatory referral to Audiology   Obesity    Discussed diet and exercise.      Relevant Orders   Comprehensive metabolic panel   Lipid panel   Hemoglobin A1c   Other Visit Diagnoses     Annual physical exam    -  Primary   Relevant Orders   Comprehensive metabolic panel   Lipid panel   CBC with Differential   Vitamin D deficiency       Relevant Orders   VITAMIN D 25 Hydroxy (Vit-D Deficiency, Fractures)   Bilateral hearing loss, unspecified hearing loss type       Relevant Orders   Ambulatory referral to Audiology   Need for shingles vaccine       Relevant Orders   Varicella-zoster vaccine IM (Shingrix) (Completed)   Need for Tdap vaccination       Relevant Orders   Tdap vaccine greater than or equal to 7yo IM (Completed)        Follow up plan: Return in about 3 months (around 03/13/2022) for  diabetes.   LABORATORY TESTING:  - Pap smear: not applicable  IMMUNIZATIONS:   - Tdap: Tetanus vaccination status reviewed: Td vaccination indicated and given today. - Influenza: Postponed to flu season - Pneumovax: Up to date - Prevnar: Up to date - HPV: Not applicable - Shingrix vaccine: Administered today - COVID vaccine: has received 3 doses of mRNA vaccine  SCREENING: - Mammogram: Not applicable  - Colonoscopy: Not applicable  - Bone Density: Refused  - Lung Cancer Screening: Not applicable   Hep C Screening: UTD STD testing and prevention (HIV/chl/gon/syphilis): no concerns Sexual History : Menstrual History/LMP/Abnormal Bleeding:  Incontinence Symptoms:   Osteoporosis: Discussed high calcium and vitamin D supplementation, weight bearing exercises  Advanced Care  Planning: A voluntary discussion about advance care planning including the explanation and discussion of advance directives.  Discussed health care proxy and Living will, and the patient was able to identify a health care proxy as daughter, Zaira Iacovelli.  Patient does not have a living will at present time. If patient does have living will, I have requested they bring this to the clinic to be scanned in to their chart.  PATIENT COUNSELING:   Advised to take 1 mg of folate supplement per day if capable of pregnancy.   Sexuality: Discussed sexually transmitted diseases, partner selection, use of condoms, avoidance of unintended pregnancy  and contraceptive alternatives.   Advised to avoid cigarette smoking.  I discussed with the patient that most people either abstain from alcohol or drink within safe limits (<=14/week and <=4 drinks/occasion for males, <=7/weeks and <= 3 drinks/occasion for females) and that the risk for alcohol disorders and other health effects rises proportionally with the number of drinks per week and how often a drinker exceeds daily limits.  Discussed cessation/primary prevention of drug use and availability of treatment for abuse.   Diet: Encouraged to adjust caloric intake to maintain  or achieve ideal body weight, to reduce intake of dietary saturated fat and total fat, to limit sodium intake by avoiding high sodium foods and not adding table salt, and to maintain adequate dietary potassium and calcium preferably from fresh fruits, vegetables, and low-fat dairy products.    stressed the importance of regular exercise  Injury prevention: Discussed safety belts, safety helmets, smoke detector, smoking near bedding or upholstery.   Dental health: Discussed importance of regular tooth brushing, flossing, and dental visits.    NEXT PREVENTATIVE PHYSICAL DUE IN 1 YEAR. Return in about 3 months (around 03/13/2022) for diabetes.

## 2021-12-11 NOTE — Assessment & Plan Note (Signed)
Refer to audiology.

## 2021-12-12 LAB — LIPID PANEL
Chol/HDL Ratio: 2.6 ratio (ref 0.0–4.4)
Cholesterol, Total: 127 mg/dL (ref 100–199)
HDL: 49 mg/dL (ref >39)
LDL Chol Calc (NIH): 48 mg/dL (ref 0–99)
Triglycerides: 187 mg/dL — ABNORMAL HIGH (ref 0–149)
VLDL Cholesterol Cal: 30 mg/dL (ref 5–40)

## 2021-12-12 LAB — COMPREHENSIVE METABOLIC PANEL
ALT: 8 IU/L (ref 0–32)
AST: 13 IU/L (ref 0–40)
Albumin/Globulin Ratio: 1.4 (ref 1.2–2.2)
Albumin: 4.2 g/dL (ref 3.7–4.7)
Alkaline Phosphatase: 97 IU/L (ref 44–121)
BUN/Creatinine Ratio: 19 (ref 12–28)
BUN: 25 mg/dL (ref 8–27)
Bilirubin Total: 0.4 mg/dL (ref 0.0–1.2)
CO2: 18 mmol/L — ABNORMAL LOW (ref 20–29)
Calcium: 9.2 mg/dL (ref 8.7–10.3)
Chloride: 97 mmol/L (ref 96–106)
Creatinine, Ser: 1.29 mg/dL — ABNORMAL HIGH (ref 0.57–1.00)
Globulin, Total: 3 g/dL (ref 1.5–4.5)
Glucose: 342 mg/dL — ABNORMAL HIGH (ref 70–99)
Potassium: 4.8 mmol/L (ref 3.5–5.2)
Sodium: 135 mmol/L (ref 134–144)
Total Protein: 7.2 g/dL (ref 6.0–8.5)
eGFR: 43 mL/min/{1.73_m2} — ABNORMAL LOW (ref >59)

## 2021-12-12 LAB — CBC WITH DIFFERENTIAL/PLATELET
Basophils Absolute: 0.1 10*3/uL (ref 0.0–0.2)
Basos: 1 %
EOS (ABSOLUTE): 0.3 10*3/uL (ref 0.0–0.4)
Eos: 3 %
Hematocrit: 38.1 % (ref 34.0–46.6)
Hemoglobin: 11.2 g/dL (ref 11.1–15.9)
Immature Grans (Abs): 0.1 10*3/uL (ref 0.0–0.1)
Immature Granulocytes: 1 %
Lymphocytes Absolute: 2.6 10*3/uL (ref 0.7–3.1)
Lymphs: 25 %
MCH: 21.6 pg — ABNORMAL LOW (ref 26.6–33.0)
MCHC: 29.4 g/dL — ABNORMAL LOW (ref 31.5–35.7)
MCV: 73 fL — ABNORMAL LOW (ref 79–97)
Monocytes Absolute: 0.8 10*3/uL (ref 0.1–0.9)
Monocytes: 8 %
Neutrophils Absolute: 6.6 10*3/uL (ref 1.4–7.0)
Neutrophils: 62 %
Platelets: 302 10*3/uL (ref 150–450)
RBC: 5.19 x10E6/uL (ref 3.77–5.28)
RDW: 16 % — ABNORMAL HIGH (ref 11.7–15.4)
WBC: 10.4 10*3/uL (ref 3.4–10.8)

## 2021-12-12 LAB — HEMOGLOBIN A1C
Est. average glucose Bld gHb Est-mCnc: 289 mg/dL
Hgb A1c MFr Bld: 11.7 % — ABNORMAL HIGH (ref 4.8–5.6)

## 2021-12-15 ENCOUNTER — Telehealth: Payer: Self-pay | Admitting: Family Medicine

## 2021-12-15 NOTE — Telephone Encounter (Signed)
Robin calling from [place] is calling to report that the pts daughter denied apt because the insurance will not pay for hearing aids through this practice.

## 2021-12-15 NOTE — Telephone Encounter (Signed)
Noted  

## 2021-12-25 ENCOUNTER — Telehealth: Payer: Self-pay

## 2021-12-25 NOTE — Telephone Encounter (Signed)
Copied from [place] [id number]. Topic: General - Inquiry >> Dec 20, 2021  1:07 PM Marcellus Scott wrote: Reason for CRM: Pt daughter stated that she needs to drop off an out-of-state nursing home form for PCP to fill out.  Pt daughter stated she will drop off this afternoon.  Pt daughter mentioned that an appointment is not needed. >> Dec 25, 2021 10:24 AM Carroll Kinds wrote: Redirecting CRM to office.

## 2021-12-25 NOTE — Telephone Encounter (Signed)
Noted. Forms received will be fill out on Monday July 24th by dr. Ky Barban.

## 2021-12-26 NOTE — Telephone Encounter (Signed)
Pts daughter called about forms she dropped of / I advised her that they were received and will be filled out on 7.24.23 by Dr. Ky Barban / pts daughter stated she needs the forms filled out before then and would like them back this week / please advise

## 2021-12-26 NOTE — Telephone Encounter (Signed)
Per patient's daughter no need to wait on a doctor to filled out the forms. That the forms states that a PA is able to fill out and that she needs it before Friday PLEASE!Marland Kitchen

## 2021-12-29 NOTE — Telephone Encounter (Signed)
Pts daughter is calling to call to check on the status of the forms. Please advise CB- [phone]

## 2022-01-01 ENCOUNTER — Telehealth: Payer: Self-pay

## 2022-01-01 NOTE — Telephone Encounter (Signed)
Forms completed

## 2022-01-01 NOTE — Telephone Encounter (Signed)
Copied from [place] [id number]. Topic: General - Other >> Jan 01, 2022 11:42 AM Cyndi Bender wrote: Reason for CRM: Pt granddaughter called for update on paperwork that was submitted. Cb# [phone]

## 2022-01-01 NOTE — Telephone Encounter (Signed)
Please review

## 2022-01-04 ENCOUNTER — Telehealth: Payer: Self-pay

## 2022-01-04 NOTE — Telephone Encounter (Signed)
Copied from [place]. Topic: General - Other >> Jan 04, 2022 11:57 AM Everette C wrote: Reason for CRM: The patient's daughter has called to share that the patient's out of state referral paperwork needs a few minor corrections  The patient's daughter would like to know what a best time to come to the practice to have the page corrected today 01/04/22  Please contact further when possible

## 2022-01-04 NOTE — Telephone Encounter (Addendum)
Patient daughter is on her way to drop off 1 page that was not filled out.

## 2022-01-08 ENCOUNTER — Telehealth: Payer: Self-pay | Admitting: Family Medicine

## 2022-01-08 ENCOUNTER — Telehealth: Payer: Self-pay | Admitting: Internal Medicine

## 2022-01-08 DIAGNOSIS — J4550 Severe persistent asthma, uncomplicated: Secondary | ICD-10-CM

## 2022-01-08 MED ORDER — EMPAGLIFLOZIN 25 MG PO TABS: 25.0000 mg | ORAL_TABLET | Freq: Every day | ORAL | 1 refills | Status: DC

## 2022-01-08 NOTE — Telephone Encounter (Signed)
Lm for patient's daughter, [name](DPR)

## 2022-01-08 NOTE — Telephone Encounter (Signed)
refilled 

## 2022-01-08 NOTE — Telephone Encounter (Signed)
Patients daughter is requesting a refill on empagliflozin (JARDIANCE) 25 MG TABS tablet.  She would like it sent to [facility] on [address].

## 2022-01-09 NOTE — Telephone Encounter (Signed)
Lm x2 for patient's daughter, [name](DPR). Will close encounter per office protocol.

## 2022-01-10 MED ORDER — ARFORMOTEROL TARTRATE 15 MCG/2ML IN NEBU: 15.0000 ug | INHALATION_SOLUTION | Freq: Two times a day (BID) | RESPIRATORY_TRACT | 2 refills | Status: DC

## 2022-01-10 NOTE — Addendum Note (Signed)
Addended by: Claudette Head A on: 01/10/2022 03:42 PM   Modules accepted: Orders

## 2022-01-10 NOTE — Telephone Encounter (Signed)
Whitney Fleming has been sent to preferred pharmacy.  Patient's daughter, [name]) is aware and voiced her understanding.  Nothing further needed.

## 2022-01-10 NOTE — Telephone Encounter (Signed)
Lm for patient's daughter, [name](DPR). Which medication is needed?

## 2022-01-17 NOTE — Telephone Encounter (Signed)
Spoke with the pharmacist at [facility] per pharmacist medication is ready for pick up

## 2022-01-17 NOTE — Telephone Encounter (Signed)
Caller, patient daughter is checking on the status as to why empagliflozin (JARDIANCE) 25 MG TABS tablet has not be filled yet.  Caller would like a follow up call today stating patient has been out of medication.   [facility] - Acupuncturist stating still awaiting PA approval for empagliflozin (JARDIANCE) 25 MG TABS tablet

## 2022-01-30 ENCOUNTER — Other Ambulatory Visit: Payer: Self-pay | Admitting: Family Medicine

## 2022-01-30 NOTE — Telephone Encounter (Signed)
Medication Refill - Medication: JANUVIA 100 MG tablet   montelukast (SINGULAIR) 10 MG tablet   carvedilol (COREG) 12.5 MG tablet  / pt would like all meds as a 90 day supply   Has the patient contacted their pharmacy? Yes.   (Agent: If no, request that the patient contact the pharmacy for the refill. If patient does not wish to contact the pharmacy document the reason why and proceed with request.) (Agent: If yes, when and what did the pharmacy advise?)  Preferred Pharmacy (with phone number or street name): [place], [place] Has the patient been seen for an appointment in the last year OR does the patient have an upcoming appointment? Yes.    Agent: Please be advised that RX refills may take up to 3 business days. We ask that you follow-up with your pharmacy.

## 2022-01-31 MED ORDER — CARVEDILOL 12.5 MG PO TABS: 12.5000 mg | ORAL_TABLET | Freq: Two times a day (BID) | ORAL | 0 refills | Status: DC

## 2022-01-31 MED ORDER — SITAGLIPTIN PHOSPHATE 100 MG PO TABS: 100.0000 mg | ORAL_TABLET | Freq: Every day | ORAL | 0 refills | Status: DC

## 2022-01-31 MED ORDER — MONTELUKAST SODIUM 10 MG PO TABS: 10.0000 mg | ORAL_TABLET | Freq: Every day | ORAL | 0 refills | Status: DC

## 2022-01-31 NOTE — Telephone Encounter (Signed)
Requested Prescriptions  Pending Prescriptions Disp Refills  . montelukast (SINGULAIR) 10 MG tablet 90 tablet 0    Sig: Take 1 tablet (10 mg total) by mouth at bedtime.     Pulmonology:  Leukotriene Inhibitors Passed - 01/30/2022 12:27 PM      Passed - Valid encounter within last 12 months    Recent Outpatient Visits          1 month ago Annual physical exam   [facility] Myles Gip, DO   5 months ago Type 2 diabetes mellitus with other circulatory complication, without long-term current use of insulin [facility])   [facility] [place], Dionne Bucy, MD   9 months ago Type 2 diabetes mellitus with hyperglycemia, with long-term current use of insulin [facility])   [facility] [place], Dionne Bucy, MD   1 year ago COPD exacerbation [facility])   [place], Vickki Muff, PA-C   1 year ago Type 2 diabetes mellitus with hyperglycemia, without long-term current use of insulin [facility])   [facility] [place], Dionne Bucy, MD             . sitaGLIPtin (JANUVIA) 100 MG tablet 90 tablet 0    Sig: Take 1 tablet (100 mg total) by mouth daily.     Endocrinology:  Diabetes - DPP-4 Inhibitors Failed - 01/30/2022 12:27 PM      Failed - HBA1C is between 0 and 7.9 and within 180 days    Hemoglobin A1C  Date Value Ref Range Status  12/25/2012 7.9 (H) 4.2 - 6.3 % Final    Comment:    The American Diabetes Association recommends that a primary goal of therapy should be <7% and that physicians should reevaluate the treatment regimen in patients with HbA1c values consistently >8%.    Hgb A1c MFr Bld  Date Value Ref Range Status  12/11/2021 11.7 (H) 4.8 - 5.6 % Final    Comment:             Prediabetes: 5.7 - 6.4          Diabetes: >6.4          Glycemic control for adults with diabetes: <7.0          Failed - Cr in normal range and within 360 days    Creatinine  Date Value Ref Range Status  12/26/2012 1.16 0.60 - 1.30  mg/dL Final   Creatinine, Ser  Date Value Ref Range Status  12/11/2021 1.29 (H) 0.57 - 1.00 mg/dL Final         Passed - Valid encounter within last 6 months    Recent Outpatient Visits          1 month ago Annual physical exam   [facility] Myles Gip, DO   5 months ago Type 2 diabetes mellitus with other circulatory complication, without long-term current use of insulin [facility])   [facility] [place], Dionne Bucy, MD   9 months ago Type 2 diabetes mellitus with hyperglycemia, with long-term current use of insulin [facility])   [facility] [place], Dionne Bucy, MD   1 year ago COPD exacerbation [facility])   [place], Vickki Muff, PA-C   1 year ago Type 2 diabetes mellitus with hyperglycemia, without long-term current use of insulin [facility])   [place], Dionne Bucy, MD             . carvedilol (COREG) 12.5 MG tablet 180  tablet 0    Sig: Take 1 tablet (12.5 mg total) by mouth 2 (two) times daily with a meal.     Cardiovascular: Beta Blockers 3 Failed - 01/30/2022 12:27 PM      Failed - Cr in normal range and within 360 days    Creatinine  Date Value Ref Range Status  12/26/2012 1.16 0.60 - 1.30 mg/dL Final   Creatinine, Ser  Date Value Ref Range Status  12/11/2021 1.29 (H) 0.57 - 1.00 mg/dL Final         Failed - Last BP in normal range    BP Readings from Last 1 Encounters:  12/11/21 (!) 124/49         Passed - AST in normal range and within 360 days    AST  Date Value Ref Range Status  12/11/2021 13 0 - 40 IU/L Final         Passed - ALT in normal range and within 360 days    ALT  Date Value Ref Range Status  12/11/2021 8 0 - 32 IU/L Final         Passed - Last Heart Rate in normal range    Pulse Readings from Last 1 Encounters:  12/11/21 90         Passed - Valid encounter within last 6 months    Recent Outpatient Visits          1 month ago Annual physical exam    [facility] Myles Gip, DO   5 months ago Type 2 diabetes mellitus with other circulatory complication, without long-term current use of insulin [facility])   [facility] [place], Dionne Bucy, MD   9 months ago Type 2 diabetes mellitus with hyperglycemia, with long-term current use of insulin [facility])   [place], Dionne Bucy, MD   1 year ago COPD exacerbation [facility])   [place], Vickki Muff, PA-C   1 year ago Type 2 diabetes mellitus with hyperglycemia, without long-term current use of insulin [facility])   [facility], Dionne Bucy, MD

## 2022-02-02 MED ORDER — CARVEDILOL 12.5 MG PO TABS: 12.5000 mg | ORAL_TABLET | Freq: Two times a day (BID) | ORAL | 0 refills | Status: DC

## 2022-02-02 MED ORDER — SITAGLIPTIN PHOSPHATE 100 MG PO TABS: 100.0000 mg | ORAL_TABLET | Freq: Every day | ORAL | 0 refills | Status: DC

## 2022-02-02 MED ORDER — MONTELUKAST SODIUM 10 MG PO TABS: 10.0000 mg | ORAL_TABLET | Freq: Every day | ORAL | 0 refills | Status: DC

## 2022-02-02 NOTE — Telephone Encounter (Signed)
transferred to [facility] per daughter request  Requested Prescriptions  Pending Prescriptions Disp Refills  . carvedilol (COREG) 12.5 MG tablet 180 tablet 0    Sig: Take 1 tablet (12.5 mg total) by mouth 2 (two) times daily with a meal.     Cardiovascular: Beta Blockers 3 Failed - 02/02/2022 11:57 AM      Failed - Cr in normal range and within 360 days    Creatinine  Date Value Ref Range Status  12/26/2012 1.16 0.60 - 1.30 mg/dL Final   Creatinine, Ser  Date Value Ref Range Status  12/11/2021 1.29 (H) 0.57 - 1.00 mg/dL Final         Failed - Last BP in normal range    BP Readings from Last 1 Encounters:  12/11/21 (!) 124/49         Passed - AST in normal range and within 360 days    AST  Date Value Ref Range Status  12/11/2021 13 0 - 40 IU/L Final         Passed - ALT in normal range and within 360 days    ALT  Date Value Ref Range Status  12/11/2021 8 0 - 32 IU/L Final         Passed - Last Heart Rate in normal range    Pulse Readings from Last 1 Encounters:  12/11/21 90         Passed - Valid encounter within last 6 months    Recent Outpatient Visits          1 month ago Annual physical exam   [facility] Myles Gip, DO   5 months ago Type 2 diabetes mellitus with other circulatory complication, without long-term current use of insulin ([place])   [facility] [place], Dionne Bucy, MD   9 months ago Type 2 diabetes mellitus with hyperglycemia, with long-term current use of insulin [facility])   [facility] [place], Dionne Bucy, MD   1 year ago COPD exacerbation [facility])   [place], Vickki Muff, PA-C   1 year ago Type 2 diabetes mellitus with hyperglycemia, without long-term current use of insulin [facility])   [place], Dionne Bucy, MD             . montelukast (SINGULAIR) 10 MG tablet 90 tablet 0    Sig: Take 1 tablet (10 mg total) by mouth at bedtime.     Pulmonology:   Leukotriene Inhibitors Passed - 02/02/2022 11:57 AM      Passed - Valid encounter within last 12 months    Recent Outpatient Visits          1 month ago Annual physical exam   [facility] Myles Gip, DO   5 months ago Type 2 diabetes mellitus with other circulatory complication, without long-term current use of insulin [facility])   [facility] [place], Dionne Bucy, MD   9 months ago Type 2 diabetes mellitus with hyperglycemia, with long-term current use of insulin [facility])   [facility], Dionne Bucy, MD   1 year ago COPD exacerbation [facility])   [place], Vickki Muff, PA-C   1 year ago Type 2 diabetes mellitus with hyperglycemia, without long-term current use of insulin [facility])   [place], Dionne Bucy, MD             . sitaGLIPtin (JANUVIA) 100 MG tablet 90 tablet 0    Sig: Take  1 tablet (100 mg total) by mouth daily.     Endocrinology:  Diabetes - DPP-4 Inhibitors Failed - 02/02/2022 11:57 AM      Failed - HBA1C is between 0 and 7.9 and within 180 days    Hemoglobin A1C  Date Value Ref Range Status  12/25/2012 7.9 (H) 4.2 - 6.3 % Final    Comment:    The American Diabetes Association recommends that a primary goal of therapy should be <7% and that physicians should reevaluate the treatment regimen in patients with HbA1c values consistently >8%.    Hgb A1c MFr Bld  Date Value Ref Range Status  12/11/2021 11.7 (H) 4.8 - 5.6 % Final    Comment:             Prediabetes: 5.7 - 6.4          Diabetes: >6.4          Glycemic control for adults with diabetes: <7.0          Failed - Cr in normal range and within 360 days    Creatinine  Date Value Ref Range Status  12/26/2012 1.16 0.60 - 1.30 mg/dL Final   Creatinine, Ser  Date Value Ref Range Status  12/11/2021 1.29 (H) 0.57 - 1.00 mg/dL Final         Passed - Valid encounter within last 6 months    Recent Outpatient Visits           1 month ago Annual physical exam   [facility] Myles Gip, DO   5 months ago Type 2 diabetes mellitus with other circulatory complication, without long-term current use of insulin [facility])   [facility] [place], Dionne Bucy, MD   9 months ago Type 2 diabetes mellitus with hyperglycemia, with long-term current use of insulin [facility])   [facility] [place], Dionne Bucy, MD   1 year ago COPD exacerbation [facility])   [place], Vickki Muff, PA-C   1 year ago Type 2 diabetes mellitus with hyperglycemia, without long-term current use of insulin ([place])   [facility] [place], Dionne Bucy, MD             Signed Prescriptions Disp Refills   montelukast (SINGULAIR) 10 MG tablet 90 tablet 0    Sig: Take 1 tablet (10 mg total) by mouth at bedtime.     Pulmonology:  Leukotriene Inhibitors Passed - 01/30/2022 12:27 PM      Passed - Valid encounter within last 12 months    Recent Outpatient Visits          1 month ago Annual physical exam   [facility] Myles Gip, DO   5 months ago Type 2 diabetes mellitus with other circulatory complication, without long-term current use of insulin [facility])   [facility] [place], Dionne Bucy, MD   9 months ago Type 2 diabetes mellitus with hyperglycemia, with long-term current use of insulin [facility])   [facility] [place], Dionne Bucy, MD   1 year ago COPD exacerbation [facility])   [place], Vickki Muff, PA-C   1 year ago Type 2 diabetes mellitus with hyperglycemia, without long-term current use of insulin [facility])   [facility], Dionne Bucy, MD              sitaGLIPtin (JANUVIA) 100 MG tablet 90 tablet 0    Sig: Take 1 tablet (100 mg total) by mouth daily.  Endocrinology:  Diabetes - DPP-4 Inhibitors Failed - 01/30/2022 12:27 PM      Failed - HBA1C is between 0 and 7.9 and within  180 days    Hemoglobin A1C  Date Value Ref Range Status  12/25/2012 7.9 (H) 4.2 - 6.3 % Final    Comment:    The American Diabetes Association recommends that a primary goal of therapy should be <7% and that physicians should reevaluate the treatment regimen in patients with HbA1c values consistently >8%.    Hgb A1c MFr Bld  Date Value Ref Range Status  12/11/2021 11.7 (H) 4.8 - 5.6 % Final    Comment:             Prediabetes: 5.7 - 6.4          Diabetes: >6.4          Glycemic control for adults with diabetes: <7.0          Failed - Cr in normal range and within 360 days    Creatinine  Date Value Ref Range Status  12/26/2012 1.16 0.60 - 1.30 mg/dL Final   Creatinine, Ser  Date Value Ref Range Status  12/11/2021 1.29 (H) 0.57 - 1.00 mg/dL Final         Passed - Valid encounter within last 6 months    Recent Outpatient Visits          1 month ago Annual physical exam   [facility] Myles Gip, DO   5 months ago Type 2 diabetes mellitus with other circulatory complication, without long-term current use of insulin ([place])   [facility] [place], Dionne Bucy, MD   9 months ago Type 2 diabetes mellitus with hyperglycemia, with long-term current use of insulin ([place])   [facility] [place], Dionne Bucy, MD   1 year ago COPD exacerbation [facility])   [place], Vickki Muff, PA-C   1 year ago Type 2 diabetes mellitus with hyperglycemia, without long-term current use of insulin ([place])   [facility] [place], Dionne Bucy, MD              carvedilol (COREG) 12.5 MG tablet 180 tablet 0    Sig: Take 1 tablet (12.5 mg total) by mouth 2 (two) times daily with a meal.     Cardiovascular: Beta Blockers 3 Failed - 01/30/2022 12:27 PM      Failed - Cr in normal range and within 360 days    Creatinine  Date Value Ref Range Status  12/26/2012 1.16 0.60 - 1.30 mg/dL Final   Creatinine, Ser  Date Value  Ref Range Status  12/11/2021 1.29 (H) 0.57 - 1.00 mg/dL Final         Failed - Last BP in normal range    BP Readings from Last 1 Encounters:  12/11/21 (!) 124/49         Passed - AST in normal range and within 360 days    AST  Date Value Ref Range Status  12/11/2021 13 0 - 40 IU/L Final         Passed - ALT in normal range and within 360 days    ALT  Date Value Ref Range Status  12/11/2021 8 0 - 32 IU/L Final         Passed - Last Heart Rate in normal range    Pulse Readings from Last 1 Encounters:  12/11/21 90         Passed -  Valid encounter within last 6 months    Recent Outpatient Visits          1 month ago Annual physical exam   [facility] Myles Gip, DO   5 months ago Type 2 diabetes mellitus with other circulatory complication, without long-term current use of insulin [facility])   [facility] [place], Dionne Bucy, MD   9 months ago Type 2 diabetes mellitus with hyperglycemia, with long-term current use of insulin [facility])   [facility], Dionne Bucy, MD   1 year ago COPD exacerbation [facility])   [place], Vickki Muff, PA-C   1 year ago Type 2 diabetes mellitus with hyperglycemia, without long-term current use of insulin [facility])   [facility], Dionne Bucy, MD

## 2022-02-02 NOTE — Telephone Encounter (Signed)
Pt daughter stated medication needs to be transferred to [facility]. Pt daughter stated [employer] are very high for pt medication.   [id number] [address], [address]  [place] [address]  Phone: [phone] Fax: [phone]  Hours: Not open 24 hours

## 2022-02-02 NOTE — Addendum Note (Signed)
Addended by: Durwin Nora on: 02/02/2022 11:57 AM   Modules accepted: Orders

## 2022-02-02 NOTE — Telephone Encounter (Signed)
Pt request medication got to [facility].

## 2022-02-05 DIAGNOSIS — H90A31 Mixed conductive and sensorineural hearing loss, unilateral, right ear with restricted hearing on the contralateral side: Secondary | ICD-10-CM | POA: Diagnosis not present

## 2022-02-05 DIAGNOSIS — H6981 Other specified disorders of Eustachian tube, right ear: Secondary | ICD-10-CM | POA: Diagnosis not present

## 2022-02-16 ENCOUNTER — Telehealth: Payer: Self-pay | Admitting: Internal Medicine

## 2022-02-20 NOTE — Telephone Encounter (Signed)
The form dropped off by the patient's son is Form [phone] and is for the [place] [employer]. to document the patient has a medical reason or disability that inhibits her ability to learn English and to answer the civics questions on the citizenship test.  I downloaded a copy of the Form [phone] and instructions on how to fill it out.  The patient was seen by Dr. Mortimer Fries on 11/15/21 and her daughter, Haani Bakula, acted as Astronomer.  I've completed as many of the demographics questions as I can and have emailed it to Dr. Mortimer Fries so he can complete the medical questions.  Once the form is completed and signed, the daughter will need to be called to pick it up - ph# [phone].  It cannot be faxed.   The $29 processing fee will also be applied when the form is completed.

## 2022-02-23 ENCOUNTER — Telehealth: Payer: Self-pay

## 2022-02-23 NOTE — Telephone Encounter (Signed)
Copied from [place] [id number]. Topic: General - Other >> Feb 23, 2022  9:36 AM Whitney Fleming wrote: Reason for [id number] daughter,   Whitney Fleming in requesting copy of immunization records

## 2022-02-23 NOTE — Telephone Encounter (Signed)
Immunization record printed and placed up front ready for pick up. Patient's daughter advised and will come to picked both parent's record.

## 2022-03-02 ENCOUNTER — Telehealth: Payer: Self-pay | Admitting: Internal Medicine

## 2022-03-02 DIAGNOSIS — J449 Chronic obstructive pulmonary disease, unspecified: Secondary | ICD-10-CM

## 2022-03-02 NOTE — Telephone Encounter (Signed)
Spoke to patient's daughter, [name](DPR). She is requesting update on forms. She is aware that form have been placed in Dr. Zoila Shutter folder. She is aware that Dr. Mortimer Fries has not been in the office. She would like forms completed prior to 03/08/2022.  Dr. Mortimer Fries, please advise. Thanks

## 2022-03-09 NOTE — Telephone Encounter (Signed)
Dr. Mortimer Fries - please advise the status of this form.  Patient's daughter asked that it be completed by 9/28.

## 2022-03-09 NOTE — Telephone Encounter (Signed)
Dr. Mortimer Fries has been out of the office. Form has not been completed.

## 2022-03-12 DIAGNOSIS — Z0289 Encounter for other administrative examinations: Secondary | ICD-10-CM

## 2022-03-13 NOTE — Telephone Encounter (Signed)
Forms have been completed and given to [facility].

## 2022-04-12 ENCOUNTER — Telehealth: Payer: Self-pay

## 2022-04-12 NOTE — Telephone Encounter (Signed)
Dr. Mortimer Fries received voicemail on his personal phone regarding a patient with the last name [place] in reference to low oxygen levels. Demographics were not stated within the message. I started researching our list of current patient's with the last name of Febres. I spoke with patient's daughter, Manisha(DPR) and confirmed that this call was not in reference to patient.  Nothing further needed.

## 2022-04-30 ENCOUNTER — Other Ambulatory Visit: Payer: Self-pay | Admitting: Family Medicine

## 2022-05-10 ENCOUNTER — Ambulatory Visit: Payer: Medicaid Other | Admitting: Family Medicine

## 2022-05-23 ENCOUNTER — Ambulatory Visit: Payer: Medicaid Other | Admitting: Internal Medicine

## 2022-06-20 ENCOUNTER — Telehealth: Payer: Self-pay | Admitting: Internal Medicine

## 2022-06-20 ENCOUNTER — Emergency Department: Payer: Medicaid Other

## 2022-06-20 ENCOUNTER — Emergency Department
Admission: EM | Admit: 2022-06-20 | Discharge: 2022-06-20 | Disposition: A | Discharge status: Home / Self Care | Payer: Medicaid Other | Attending: Student in an Organized Health Care Education/Training Program | Admitting: Student in an Organized Health Care Education/Training Program

## 2022-06-20 ENCOUNTER — Other Ambulatory Visit: Payer: Self-pay

## 2022-06-20 ENCOUNTER — Encounter: Payer: Self-pay | Admitting: Emergency Medicine

## 2022-06-20 DIAGNOSIS — D72829 Elevated white blood cell count, unspecified: Secondary | ICD-10-CM | POA: Insufficient documentation

## 2022-06-20 DIAGNOSIS — J40 Bronchitis, not specified as acute or chronic: Secondary | ICD-10-CM | POA: Diagnosis not present

## 2022-06-20 DIAGNOSIS — J4550 Severe persistent asthma, uncomplicated: Secondary | ICD-10-CM

## 2022-06-20 DIAGNOSIS — R0602 Shortness of breath: Secondary | ICD-10-CM | POA: Diagnosis not present

## 2022-06-20 DIAGNOSIS — J45909 Unspecified asthma, uncomplicated: Secondary | ICD-10-CM | POA: Insufficient documentation

## 2022-06-20 DIAGNOSIS — J449 Chronic obstructive pulmonary disease, unspecified: Secondary | ICD-10-CM | POA: Diagnosis not present

## 2022-06-20 DIAGNOSIS — Z20822 Contact with and (suspected) exposure to covid-19: Secondary | ICD-10-CM | POA: Insufficient documentation

## 2022-06-20 LAB — BASIC METABOLIC PANEL
Anion gap: 9 (ref 5–15)
BUN: 18 mg/dL (ref 8–23)
CO2: 20 mmol/L — ABNORMAL LOW (ref 22–32)
Calcium: 8.6 mg/dL — ABNORMAL LOW (ref 8.9–10.3)
Chloride: 104 mmol/L (ref 98–111)
Creatinine, Ser: 0.96 mg/dL (ref 0.44–1.00)
GFR, Estimated: >60 mL/min (ref >60)
Glucose, Bld: 287 mg/dL — ABNORMAL HIGH (ref 70–99)
Potassium: 5.1 mmol/L (ref 3.5–5.1)
Sodium: 133 mmol/L — ABNORMAL LOW (ref 135–145)

## 2022-06-20 LAB — CBC
HCT: 38.7 % (ref 36.0–46.0)
Hemoglobin: 11.4 g/dL — ABNORMAL LOW (ref 12.0–15.0)
MCH: 22.1 pg — ABNORMAL LOW (ref 26.0–34.0)
MCHC: 29.5 g/dL — ABNORMAL LOW (ref 30.0–36.0)
MCV: 75.1 fL — ABNORMAL LOW (ref 80.0–100.0)
Platelets: 363 10*3/uL (ref 150–400)
RBC: 5.15 MIL/uL — ABNORMAL HIGH (ref 3.87–5.11)
RDW: 19.9 % — ABNORMAL HIGH (ref 11.5–15.5)
WBC: 14.1 10*3/uL — ABNORMAL HIGH (ref 4.0–10.5)
nRBC: 0 % (ref 0.0–0.2)

## 2022-06-20 LAB — RESP PANEL BY RT-PCR (RSV, FLU A&B, COVID)  RVPGX2
Influenza A by PCR: NEGATIVE
Influenza B by PCR: NEGATIVE
Resp Syncytial Virus by PCR: NEGATIVE
SARS Coronavirus 2 by RT PCR: NEGATIVE

## 2022-06-20 MED ORDER — PREDNISONE 20 MG PO TABS
60.0000 mg | ORAL_TABLET | Freq: Once | ORAL | Status: AC
  Administered 2022-06-20: 60 mg via ORAL
  Filled 2022-06-20: qty 3

## 2022-06-20 MED ORDER — IPRATROPIUM-ALBUTEROL 0.5-2.5 (3) MG/3ML IN SOLN
3.0000 mL | Freq: Once | RESPIRATORY_TRACT | Status: AC
  Administered 2022-06-20: 3 mL via RESPIRATORY_TRACT
  Filled 2022-06-20: qty 3

## 2022-06-20 MED ORDER — ARFORMOTEROL TARTRATE 15 MCG/2ML IN NEBU: 15.0000 ug | INHALATION_SOLUTION | Freq: Two times a day (BID) | RESPIRATORY_TRACT | 2 refills | Status: DC

## 2022-06-20 MED ORDER — IPRATROPIUM-ALBUTEROL 0.5-2.5 (3) MG/3ML IN SOLN: 3.0000 mL | Freq: Four times a day (QID) | RESPIRATORY_TRACT | 1 refills | Status: DC | PRN

## 2022-06-20 MED ORDER — AZITHROMYCIN 250 MG PO TABS: ORAL_TABLET | ORAL | 0 refills | Status: DC

## 2022-06-20 MED ORDER — PREDNISONE 20 MG PO TABS: 40.0000 mg | ORAL_TABLET | Freq: Every day | ORAL | 0 refills | Status: AC

## 2022-06-20 MED ORDER — AZITHROMYCIN 500 MG PO TABS
500.0000 mg | ORAL_TABLET | Freq: Once | ORAL | Status: AC
  Administered 2022-06-20: 500 mg via ORAL
  Filled 2022-06-20: qty 1

## 2022-06-20 NOTE — Telephone Encounter (Signed)
Daughter calling for refill of arformoterol 15 MCG/2ML Nebu   She is avail @ phone  # on file.  Pharm: [facility] on [address].   She just went to Hospital and daughter said that Dr. Recommended another med for her was well. Did not recall what it was.

## 2022-06-20 NOTE — ED Provider Notes (Signed)
[facility] Provider Note    Event Date/Time   First MD Initiated Contact with Patient 06/20/22 [phone]     (approximate)   History   Shortness of Breath   HPI  Whitney Fleming is a 77 y.o. female with a past history of asthma COPD presents to the ER for evaluation of cough congestion.  Has had some shortness of breath.  Symptoms got worse last night.  Was recently on a course of prednisone.  Has received outpatient antibiotics for cough amoxicillin.  No measured fevers.  Denies any abdominal pain no lower extremity swelling.  Does not wear home oxygen.     Physical Exam   Triage Vital Signs: ED Triage Vitals [06/20/22 0730]  Enc Vitals Group     BP (!) 147/67     Pulse Rate (!) 116     Resp 20     Temp 97.7 F (36.5 C)     Temp Source Oral     SpO2 96 %     Weight 151 lb (68.5 kg)     Height 5' (1.524 m)     Head Circumference      Peak Flow      Pain Score 0     Pain Loc      Pain Edu?      Excl. in [place]?     Most recent vital signs: Vitals:   06/20/22 0730 06/20/22 0950  BP: (!) 147/67 (!) 133/51  Pulse: (!) 116 85  Resp: 20 18  Temp: 97.7 F (36.5 C) 98.8 F (37.1 C)  SpO2: 96% 98%     Constitutional: Alert  Eyes: Conjunctivae are normal.  Head: Atraumatic. Nose: No congestion/rhinnorhea. Mouth/Throat: Mucous membranes are moist.   Neck: Painless ROM.  Cardiovascular:   Good peripheral circulation. Respiratory: Mild tachypnea with faint expiratory wheeze.  No crackles. Gastrointestinal: Soft and nontender.  Musculoskeletal:  no deformity Neurologic:  MAE spontaneously. No gross focal neurologic deficits are appreciated.  Skin:  Skin is warm, dry and intact. No rash noted. Psychiatric: Mood and affect are normal. Speech and behavior are normal.    ED Results / Procedures / Treatments   Labs (all labs ordered are listed, but only abnormal results are displayed) Labs Reviewed  BASIC METABOLIC PANEL - Abnormal; Notable for  the following components:      Result Value   Sodium 133 (*)    CO2 20 (*)    Glucose, Bld 287 (*)    Calcium 8.6 (*)    All other components within normal limits  CBC - Abnormal; Notable for the following components:   WBC 14.1 (*)    RBC 5.15 (*)    Hemoglobin 11.4 (*)    MCV 75.1 (*)    MCH 22.1 (*)    MCHC 29.5 (*)    RDW 19.9 (*)    All other components within normal limits  RESP PANEL BY RT-PCR (RSV, FLU A&B, COVID)  [id number]     EKG  ED ECG REPORT I, Merlyn Lot, the attending physician, personally viewed and interpreted this ECG.   Date: 06/20/2022  EKG Time: 7:37  Rate: 115  Rhythm: sinus  Axis: normal  Intervals: normal  ST&T Change: no stemi, no depressions    RADIOLOGY Please see ED Course for my review and interpretation.  I personally reviewed all radiographic images ordered to evaluate for the above acute complaints and reviewed radiology reports and findings.  These findings were personally discussed with  the patient.  Please see medical record for radiology report.    PROCEDURES:  Critical Care performed:   Procedures   MEDICATIONS ORDERED IN ED: Medications  ipratropium-albuterol (DUONEB) 0.5-2.5 (3) MG/3ML nebulizer solution 3 mL (3 mLs Nebulization Given 06/20/22 0901)  azithromycin (ZITHROMAX) tablet 500 mg (500 mg Oral Given 06/20/22 0928)  predniSONE (DELTASONE) tablet 60 mg (60 mg Oral Given 06/20/22 0928)     IMPRESSION / MDM / ASSESSMENT AND PLAN / ED COURSE  I reviewed the triage vital signs and the nursing notes.                              Differential diagnosis includes, but is not limited to, Asthma, copd, CHF, pna, ptx, malignancy, Pe, anemia  Patient presenting to the ER for evaluation of symptoms as described above.  Based on symptoms, risk factors and considered above differential, this presenting complaint could reflect a potentially life-threatening illness therefore the patient will be placed on continuous pulse  oximetry and telemetry for monitoring.  Laboratory evaluation will be sent to evaluate for the above complaints.  Chest x-ray on my review and interpretation does not show any evidence of consolidation or infiltrate no pneumothorax.  Suspect bronchitis will check for viral pathogens.  Chest x-ray without any signs of pneumonia.  Does have mild leukocytosis but recently on prednisone.  I have a low suspicion for PE given lack of hypoxia, no le swelling, pain and exam findings history more c/w asthma/bronchitis.   Clinical Course as of 06/20/22 1004  Wed Jun 20, 2022  0900 Viral panel is negative.  Will place on azithromycin for anti-inflammatory effects possible community-acquired pneumonia the chest x-ray without consolidation.  Will give prednisone.  Will observe after nebulizer treatment. [PR]  0955 Reassessed.  Remains hemodynamically stable afebrile with no hypoxia.  Does appear stable appropriate for trial of outpatient management.  Discussed return precautions.  Patient family agreeable with plan. [PR]    Clinical Course User Index [PR] Merlyn Lot, MD     FINAL CLINICAL IMPRESSION(S) / ED DIAGNOSES   Final diagnoses:  Bronchitis     Rx / DC Orders   ED Discharge Orders          Ordered    predniSONE (DELTASONE) 20 MG tablet  Daily        06/20/22 0958    azithromycin (ZITHROMAX) 250 MG tablet        06/20/22 0958    arformoterol (BROVANA) 15 MCG/2ML NEBU  2 times daily       Note to Pharmacy: [id number]   06/20/22 0958    ipratropium-albuterol (DUONEB) 0.5-2.5 (3) MG/3ML SOLN  Every 6 hours PRN        06/20/22 0958             Note:  This document was prepared using Dragon voice recognition software and may include unintentional dictation errors.    Merlyn Lot, MD 06/20/22 1004

## 2022-06-20 NOTE — Telephone Encounter (Signed)
Brovana sent to preferred pharmacy.  Patient's daughter, [name]) is aware and voiced her understanding.  Pending appt 07/24/2022 with Dr. Mortimer Fries. Nothing further needed.

## 2022-06-20 NOTE — ED Notes (Signed)
Walking O2 92-94%

## 2022-06-20 NOTE — ED Triage Notes (Signed)
Patient to ED via POV from home for SOB with weakness and cough. Symptoms ongoing for the past 2 weeks. Hx of COPD. Coughing up yellow colored sputum.

## 2022-06-25 ENCOUNTER — Telehealth: Payer: Self-pay | Admitting: Pulmonary Disease

## 2022-06-25 ENCOUNTER — Ambulatory Visit: Payer: Medicaid Other | Admitting: Family Medicine

## 2022-06-25 ENCOUNTER — Telehealth: Payer: Self-pay | Admitting: Internal Medicine

## 2022-06-25 ENCOUNTER — Encounter: Payer: Self-pay | Admitting: Family Medicine

## 2022-06-25 VITALS — BP 123/71 | HR 97 | Temp 98.0°F | Resp 16 | Wt 167.7 lb

## 2022-06-25 DIAGNOSIS — I152 Hypertension secondary to endocrine disorders: Secondary | ICD-10-CM

## 2022-06-25 DIAGNOSIS — Z6832 Body mass index (BMI) 32.0-32.9, adult: Secondary | ICD-10-CM

## 2022-06-25 DIAGNOSIS — E1169 Type 2 diabetes mellitus with other specified complication: Secondary | ICD-10-CM | POA: Diagnosis not present

## 2022-06-25 DIAGNOSIS — E1159 Type 2 diabetes mellitus with other circulatory complications: Secondary | ICD-10-CM | POA: Diagnosis not present

## 2022-06-25 DIAGNOSIS — E669 Obesity, unspecified: Secondary | ICD-10-CM | POA: Diagnosis not present

## 2022-06-25 DIAGNOSIS — J4550 Severe persistent asthma, uncomplicated: Secondary | ICD-10-CM

## 2022-06-25 DIAGNOSIS — J411 Mucopurulent chronic bronchitis: Secondary | ICD-10-CM | POA: Diagnosis not present

## 2022-06-25 DIAGNOSIS — E66811 Obesity, class 1: Secondary | ICD-10-CM

## 2022-06-25 DIAGNOSIS — E785 Hyperlipidemia, unspecified: Secondary | ICD-10-CM

## 2022-06-25 DIAGNOSIS — E1165 Type 2 diabetes mellitus with hyperglycemia: Secondary | ICD-10-CM

## 2022-06-25 DIAGNOSIS — M7989 Other specified soft tissue disorders: Secondary | ICD-10-CM

## 2022-06-25 LAB — POCT GLYCOSYLATED HEMOGLOBIN (HGB A1C)
Est. average glucose Bld gHb Est-mCnc: 217
Hemoglobin A1C: 9.2 % — AB (ref 4.0–5.6)

## 2022-06-25 MED ORDER — MONTELUKAST SODIUM 10 MG PO TABS: 10.0000 mg | ORAL_TABLET | Freq: Every day | ORAL | 3 refills | Status: DC

## 2022-06-25 MED ORDER — SITAGLIPTIN PHOSPHATE 100 MG PO TABS: 100.0000 mg | ORAL_TABLET | Freq: Every day | ORAL | 0 refills | Status: DC

## 2022-06-25 MED ORDER — EMPAGLIFLOZIN 25 MG PO TABS: 25.0000 mg | ORAL_TABLET | Freq: Every day | ORAL | 1 refills | Status: DC

## 2022-06-25 MED ORDER — LANTUS SOLOSTAR 100 UNIT/ML ~~LOC~~ SOPN: 20.0000 [IU] | PEN_INJECTOR | Freq: Every day | SUBCUTANEOUS | 5 refills | Status: DC

## 2022-06-25 MED ORDER — ARFORMOTEROL TARTRATE 15 MCG/2ML IN NEBU: 15.0000 ug | INHALATION_SOLUTION | Freq: Two times a day (BID) | RESPIRATORY_TRACT | 2 refills | Status: DC

## 2022-06-25 MED ORDER — NOVOFINE PEN NEEDLE 32G X 6 MM MISC: 1 refills | Status: DC

## 2022-06-25 MED ORDER — ROSUVASTATIN CALCIUM 5 MG PO TABS: 5.0000 mg | ORAL_TABLET | Freq: Every day | ORAL | 1 refills | Status: DC

## 2022-06-25 NOTE — Assessment & Plan Note (Signed)
Chronic and uncontrolled with hyperglycemia Associated with hypertension and hyperlipidemia Up-to-date on screenings Has been off of her Jardiance and Januvia for about 1 week, so we will resume She did just have short course of prednisone Increase basal insulin dose to 20 units daily She can continue to use 30 units daily when she is on a course of prednisone as this does worsen her hyperglycemia Follow-up in 3 months and repeat A1c

## 2022-06-25 NOTE — Assessment & Plan Note (Signed)
Well-controlled on recent lipid panel Continue statin Repeat lipid panel annually

## 2022-06-25 NOTE — Assessment & Plan Note (Signed)
Recent exacerbation that was treated in the ED She was treated with azithromycin and prednisone She has finished her course of both of these medications She does still have cough, but is otherwise feeling better Her lung exam is back to baseline today No additional azithromycin or prednisone indicated at this time Continue controller inhalers Upcoming follow-up with pulmonology Okay to use honey cough drops for cough

## 2022-06-25 NOTE — Assessment & Plan Note (Signed)
New problem Patient had diffuse anasarca when her daughter picked her up from her nursing home in [place] about a week ago Most of the swelling has resolved, but she does still have some in her feet and ankles Left is slightly greater than right She does not have stigmata of DVT as she has no calf tenderness or and a negative Bevelyn Buckles' sign Her swelling was present before her car ride from [place] to [place] and they stopped multiple times for her to get out and move around She had recently had 5 bags of IV fluids per daughter before the swelling started I suspect that she had too much IV fluids which caused generalized swelling that is slowly improving Her kidney function was normal on recent check in the ED, but she was slightly hyponatremic I will repeat today

## 2022-06-25 NOTE — Assessment & Plan Note (Signed)
Discussed importance of healthy weight management Discussed diet and exercise  

## 2022-06-25 NOTE — Progress Notes (Signed)
I,Sulibeya S Dimas,acting as a Education administrator for Lavon Paganini, MD.,have documented all relevant documentation on the behalf of Lavon Paganini, MD,as directed by  Lavon Paganini, MD while in the presence of Lavon Paganini, MD.     Established patient visit   Patient: Whitney Fleming   DOB: 1946-01-31   77 y.o. Female  MRN: [id number] Visit Date: 06/25/2022  Today's healthcare provider: Lavon Paganini, MD   Chief Complaint  Patient presents with   Diabetes   Subjective    HPI   Patient's daughter is present and provides interpretation services.  They declined formal interpreter.  Diabetes Mellitus Type II, Follow-up  Lab Results  Component Value Date   HGBA1C 9.2 (A) 06/25/2022   HGBA1C 11.7 (H) 12/11/2021   HGBA1C 11.3 (A) 08/10/2021   Wt Readings from Last 3 Encounters:  06/25/22 167 lb 11.2 oz (76.1 kg)  06/20/22 151 lb (68.5 kg)  12/11/21 153 lb 1.6 oz (69.4 kg)   Last seen for diabetes 6 months ago.  Management since then includes increase lantus to 20 units daily. She reports excellent compliance with treatment. She is not having side effects.   Home blood sugar records: fasting range: 240's  Episodes of hypoglycemia? No    Current insulin regiment: Lantus 20 units daily. 30 now due to prednisone.  Most Recent Eye Exam: UTD  Noticed diffuse anasarca last week. Had received 5 bags of IVF just before. Swelling overall better except leg L>R   Pertinent Labs: Lab Results  Component Value Date   CHOL 127 12/11/2021   HDL 49 12/11/2021   LDLCALC 48 12/11/2021   TRIG 187 (H) 12/11/2021   CHOLHDL 2.6 12/11/2021   Lab Results  Component Value Date   NA 133 (L) 06/20/2022   K 5.1 06/20/2022   CREATININE 0.96 06/20/2022   GFRNONAA >60 06/20/2022   LABMICR 9.9 08/10/2021   MICRALBCREAT 29 08/10/2021     --------------------------------------------------------------------------------------------------- Hypertension, follow-up  BP Readings from  Last 3 Encounters:  06/25/22 123/71  06/20/22 (!) 133/51  12/11/21 (!) 124/49   Wt Readings from Last 3 Encounters:  06/25/22 167 lb 11.2 oz (76.1 kg)  06/20/22 151 lb (68.5 kg)  12/11/21 153 lb 1.6 oz (69.4 kg)     She was last seen for hypertension 6 months ago.  BP at that visit was 124/51. Management since that visit includes no changes.  She reports excellent compliance with treatment. She is not having side effects.   Outside blood pressures are not being checked.  Pertinent labs Lab Results  Component Value Date   CHOL 127 12/11/2021   HDL 49 12/11/2021   LDLCALC 48 12/11/2021   TRIG 187 (H) 12/11/2021   CHOLHDL 2.6 12/11/2021   Lab Results  Component Value Date   NA 133 (L) 06/20/2022   K 5.1 06/20/2022   CREATININE 0.96 06/20/2022   GFRNONAA >60 06/20/2022   GLUCOSE 287 (H) 06/20/2022   TSH 1.959 02/05/2018     The ASCVD Risk score (Arnett DK, et al., 2019) failed to calculate for the following reasons:   The valid total cholesterol range is 130 to 320 mg/dL  --------------------------------------------------------------------------------------------------- Lipid/Cholesterol, Follow-up  Last lipid panel Other pertinent labs  Lab Results  Component Value Date   CHOL 127 12/11/2021   HDL 49 12/11/2021   LDLCALC 48 12/11/2021   TRIG 187 (H) 12/11/2021   CHOLHDL 2.6 12/11/2021   Lab Results  Component Value Date   ALT 8 12/11/2021   AST 13 12/11/2021  PLT 363 06/20/2022   TSH 1.959 02/05/2018     She was last seen for this 6 months ago.  Management since that visit includes no changes.  She reports excellent compliance with treatment. She  is not  having side effects.    The ASCVD Risk score (Arnett DK, et al., 2019) failed to calculate for the following reasons:   The valid total cholesterol range is 130 to 320  mg/dL  ---------------------------------------------------------------------------------------------------   Medications: Outpatient Medications Prior to Visit  Medication Sig   Accu-Chek FastClix Lancets MISC Use as directed to check blood glucose twice daily   albuterol (PROVENTIL) (2.5 MG/3ML) 0.083% nebulizer solution Take 3 mLs (2.5 mg total) by nebulization every 6 (six) hours as needed for wheezing or shortness of breath.   albuterol (VENTOLIN HFA) 108 (90 Base) MCG/ACT inhaler Inhale 2 puffs into the lungs every 6 (six) hours as needed for wheezing or shortness of breath.   AMBULATORY NON FORMULARY MEDICATION Medication Name: Aero chamber use as directed. DX:J45.909   amLODipine (NORVASC) 5 MG tablet Take 5 mg by mouth daily.   arformoterol (BROVANA) 15 MCG/2ML NEBU Take 2 mLs (15 mcg total) by nebulization in the morning and at bedtime.   aspirin EC 81 MG tablet Take 1 tablet (81 mg total) by mouth daily. Swallow whole.   budesonide (PULMICORT) 0.5 MG/2ML nebulizer solution Take 2 mLs (0.5 mg total) by nebulization 2 (two) times daily.   carvedilol (COREG) 12.5 MG tablet Take 1 tablet (12.5 mg total) by mouth 2 (two) times daily with a meal.   glucose blood (ACCU-CHEK GUIDE) test strip USE  STRIP TO CHECK GLUCOSE TWICE DAILY AS DIRECTED   ipratropium-albuterol (DUONEB) 0.5-2.5 (3) MG/3ML SOLN Take 3 mLs by nebulization every 6 (six) hours as needed.   lisinopril (ZESTRIL) 20 MG tablet Take 20 mg by mouth daily.   metFORMIN (GLUCOPHAGE) 1000 MG tablet TAKE 1 TABLET BY MOUTH TWICE DAILY WITH A MEAL   predniSONE (DELTASONE) 20 MG tablet Take 2 tablets (40 mg total) by mouth daily for 5 days.   [DISCONTINUED] empagliflozin (JARDIANCE) 25 MG TABS tablet Take 1 tablet (25 mg total) by mouth daily.   [DISCONTINUED] Insulin Pen Needle (NOVOFINE) 32G X 6 MM MISC Use as directed to inject insulin daily   [DISCONTINUED] montelukast (SINGULAIR) 10 MG tablet TAKE 1 TABLET(10 MG) BY MOUTH AT  BEDTIME   [DISCONTINUED] rosuvastatin (CRESTOR) 5 MG tablet Take 1 tablet by mouth once daily   [DISCONTINUED] sitaGLIPtin (JANUVIA) 100 MG tablet Take 1 tablet (100 mg total) by mouth daily.   [DISCONTINUED] azithromycin (ZITHROMAX) 250 MG tablet Take one daily (Patient not taking: Reported on 06/25/2022)   [DISCONTINUED] insulin glargine (LANTUS SOLOSTAR) 100 UNIT/ML Solostar Pen Inject 15 Units into the skin daily for 7 days. Decrease to 10 units if sugars less then 200   No facility-administered medications prior to visit.    Review of Systems Per HPI     Objective    BP 123/71 (BP Location: Left Arm, Patient Position: Sitting, Cuff Size: Large)   Pulse 97   Temp 98 F (36.7 C) (Temporal)   Resp 16   Wt 167 lb 11.2 oz (76.1 kg)   SpO2 98%   BMI 32.75 kg/m    Physical Exam Vitals reviewed.  Constitutional:      General: She is not in acute distress.    Appearance: Normal appearance. She is well-developed. She is not diaphoretic.  HENT:     Head: Normocephalic  and atraumatic.  Eyes:     General: No scleral icterus.    Conjunctiva/sclera: Conjunctivae normal.  Neck:     Thyroid: No thyromegaly.  Cardiovascular:     Rate and Rhythm: Normal rate and regular rhythm.     Heart sounds: Normal heart sounds. No murmur heard. Pulmonary:     Effort: Pulmonary effort is normal. No respiratory distress.     Breath sounds: Rhonchi present. No wheezing or rales.  Musculoskeletal:     Cervical back: Neck supple.     Right lower leg: Edema present.     Left lower leg: Edema (L>R slightly, no calf tenderness, negative Homans) present.  Lymphadenopathy:     Cervical: No cervical adenopathy.  Skin:    General: Skin is warm and dry.     Findings: No rash.  Neurological:     Mental Status: She is alert. Mental status is at baseline.  Psychiatric:        Mood and Affect: Mood normal.        Behavior: Behavior normal.       Results for orders placed or performed in visit on  06/25/22  POCT glycosylated hemoglobin (Hb A1C)  Result Value Ref Range   Hemoglobin A1C 9.2 (A) 4.0 - 5.6 %   Est. average glucose Bld gHb Est-mCnc 217     Assessment & Plan     Problem List Items Addressed This Visit       Cardiovascular and Mediastinum   Hypertension associated with diabetes ([place])    Well controlled on manual recheck Continue current medications Recheck metabolic panel      Relevant Medications   insulin glargine (LANTUS SOLOSTAR) 100 UNIT/ML Solostar Pen   lisinopril (ZESTRIL) 20 MG tablet   rosuvastatin (CRESTOR) 5 MG tablet   sitaGLIPtin (JANUVIA) 100 MG tablet   empagliflozin (JARDIANCE) 25 MG TABS tablet   Other Relevant Orders   Basic Metabolic Panel (BMET)     Respiratory   COPD (chronic obstructive pulmonary disease) (HCC)    Recent exacerbation that was treated in the ED She was treated with azithromycin and prednisone She has finished her course of both of these medications She does still have cough, but is otherwise feeling better Her lung exam is back to baseline today No additional azithromycin or prednisone indicated at this time Continue controller inhalers Upcoming follow-up with pulmonology Okay to use honey cough drops for cough      Relevant Medications   montelukast (SINGULAIR) 10 MG tablet     Endocrine   Diabetes ([place]) - Primary    Chronic and uncontrolled with hyperglycemia Associated with hypertension and hyperlipidemia Up-to-date on screenings Has been off of her Jardiance and Januvia for about 1 week, so we will resume She did just have short course of prednisone Increase basal insulin dose to 20 units daily She can continue to use 30 units daily when she is on a course of prednisone as this does worsen her hyperglycemia Follow-up in 3 months and repeat A1c      Relevant Medications   insulin glargine (LANTUS SOLOSTAR) 100 UNIT/ML Solostar Pen   lisinopril (ZESTRIL) 20 MG tablet   rosuvastatin (CRESTOR) 5 MG  tablet   sitaGLIPtin (JANUVIA) 100 MG tablet   empagliflozin (JARDIANCE) 25 MG TABS tablet   Other Relevant Orders   POCT glycosylated hemoglobin (Hb A1C) (Completed)   Hyperlipidemia associated with type 2 diabetes mellitus ([place])    Well-controlled on recent lipid panel Continue statin Repeat lipid panel  annually      Relevant Medications   insulin glargine (LANTUS SOLOSTAR) 100 UNIT/ML Solostar Pen   lisinopril (ZESTRIL) 20 MG tablet   rosuvastatin (CRESTOR) 5 MG tablet   sitaGLIPtin (JANUVIA) 100 MG tablet   empagliflozin (JARDIANCE) 25 MG TABS tablet     Other   Obesity    Discussed importance of healthy weight management Discussed diet and exercise       Relevant Medications   insulin glargine (LANTUS SOLOSTAR) 100 UNIT/ML Solostar Pen   sitaGLIPtin (JANUVIA) 100 MG tablet   empagliflozin (JARDIANCE) 25 MG TABS tablet   Leg swelling    New problem Patient had diffuse anasarca when her daughter picked her up from her nursing home in [place] about a week ago Most of the swelling has resolved, but she does still have some in her feet and ankles Left is slightly greater than right She does not have stigmata of DVT as she has no calf tenderness or and a negative Bevelyn Buckles' sign Her swelling was present before her car ride from [place] to [place] and they stopped multiple times for her to get out and move around She had recently had 5 bags of IV fluids per daughter before the swelling started I suspect that she had too much IV fluids which caused generalized swelling that is slowly improving Her kidney function was normal on recent check in the ED, but she was slightly hyponatremic I will repeat today        Return in about 3 months (around 09/24/2022) for chronic disease f/u.      I, Lavon Paganini, MD, have reviewed all documentation for this visit. The documentation on 06/25/22 for the exam, diagnosis, procedures, and orders are all accurate and  complete.   Nicoletta Hush, Dionne Bucy, MD, MPH [facility]

## 2022-06-25 NOTE — Telephone Encounter (Signed)
Spoke to patient's daughter, [name]) She is aware that Garlon Hatchet has been sent to preferred pharmacy. She will contact PCP for refills on Singulair. She is aware that 07/24/2022 is first available with Dr. Mortimer Fries. Offered sooner appt with NP in [place] or another provider in [place] and she declined. She would like to keep scheduled appt. Nothing further needed.

## 2022-06-25 NOTE — Assessment & Plan Note (Signed)
Well controlled on manual recheck Continue current medications Recheck metabolic panel

## 2022-06-25 NOTE — Telephone Encounter (Signed)
error 

## 2022-06-26 ENCOUNTER — Other Ambulatory Visit (HOSPITAL_COMMUNITY): Payer: Self-pay

## 2022-06-26 LAB — BASIC METABOLIC PANEL
BUN/Creatinine Ratio: 21 (ref 12–28)
BUN: 25 mg/dL (ref 8–27)
CO2: 18 mmol/L — ABNORMAL LOW (ref 20–29)
Calcium: 9.3 mg/dL (ref 8.7–10.3)
Chloride: 93 mmol/L — ABNORMAL LOW (ref 96–106)
Creatinine, Ser: 1.18 mg/dL — ABNORMAL HIGH (ref 0.57–1.00)
Glucose: 444 mg/dL — ABNORMAL HIGH (ref 70–99)
Potassium: 5.9 mmol/L — ABNORMAL HIGH (ref 3.5–5.2)
Sodium: 130 mmol/L — ABNORMAL LOW (ref 134–144)
eGFR: 47 mL/min/{1.73_m2} — ABNORMAL LOW (ref >59)

## 2022-06-27 ENCOUNTER — Telehealth: Payer: Self-pay

## 2022-06-27 ENCOUNTER — Other Ambulatory Visit (HOSPITAL_COMMUNITY): Payer: Self-pay

## 2022-06-27 ENCOUNTER — Other Ambulatory Visit: Payer: Self-pay

## 2022-06-27 DIAGNOSIS — I152 Hypertension secondary to endocrine disorders: Secondary | ICD-10-CM

## 2022-06-27 NOTE — Telephone Encounter (Signed)
PA request received via CMM for Arformoterol Tartrate 15MCG/2ML nebulizer solution  PA has been submitted to [employer] .  PA is pending determination.   Key: Linna Hoff

## 2022-06-28 NOTE — Telephone Encounter (Signed)
PA has been APPROVED from 06/27/2022-06/28/2023

## 2022-07-13 ENCOUNTER — Ambulatory Visit: Payer: Self-pay | Admitting: *Deleted

## 2022-07-13 NOTE — Telephone Encounter (Signed)
Summary: cough med   Patients daughter called in checking status if Dr Brita Romp is going to send in cough med for patient who already had an appt 01/15.         Patient's daughter is calling back to request azithromycin for her mother- advised provider is not in office this afternoon - so it will be Monday before the request is addressed. Advised UC over the weekend if she feels her mother is getting worse for evaluation. She feels confident with negative X-ray she can wait until Monday. Reason for Disposition  Prescription request for new medicine (not a refill)  Answer Assessment - Initial Assessment Questions 1. NAME of MEDICINE: "What medicine(s) are you calling about?"     Requesting cough medication and antibiotic 2. QUESTION: "What is your question?" (e.g., double dose of medicine, side effect)     Mother was in office 06/29/22- advised would wait to treat cough due to recent prednisone treatment. Daughter is requesting treatment now. 3. PRESCRIBER: "Who prescribed the medicine?" Reason: if prescribed by specialist, call should be referred to that group.     PCP 4. SYMPTOMS: "Do you have any symptoms?" If Yes, ask: "What symptoms are you having?"  "How bad are the symptoms (e.g., mild, moderate, severe)     Cough- not better  Protocols used: Medication Question Call-A-AH

## 2022-07-13 NOTE — Telephone Encounter (Signed)
Summary: cough / rx req   The patient has experienced coughing for roughly two days  The patient's daughter shares that the cough has been previously discussed with their PCP  The patient would like to be prescribed something for their discomfort, they have previously tried otc medication which has been ineffective  Please contact further when possible          Chief Complaint: Daughter reports her mother still has productive cough. Patient was seen 06/25/22. Daughter would like to get further treatment- cough medication, antibiotic for the cough.  Symptoms: productive cough Frequency: worse 3-4 days- was seen in office 1/15 and advised to call back is cough not improved Pertinent Negatives: Patient denies fever, wheezing Disposition: '[]'$ ED /'[]'$ Urgent Care (no appt availability in office) / '[]'$ Appointment(In office/virtual)/ '[]'$  [place] [facility]/ '[]'$ Home Care/ '[x]'$ Refused Recommended Disposition /'[]'$ [place] [facility]/ '[]'$  Follow-up with PCP Additional Notes: Patient's daughter requesting medication for cough- patient in office 1/15- wants Rx sent to [facility]  Reason for Disposition  [1] Continuous (nonstop) coughing interferes with work or school AND [2] no improvement using cough treatment per Care Advice  Answer Assessment - Initial Assessment Questions 1. ONSET: "When did the cough begin?"      3-4 days 2. SEVERITY: "How bad is the cough today?"      Keeping her up at night 3. SPUTUM: "Describe the color of your sputum" (none, dry cough; clear, white, yellow, green)     White 4. HEMOPTYSIS: "Are you coughing up any blood?" If so ask: "How much?" (flecks, streaks, tablespoons, etc.)     no 5. DIFFICULTY BREATHING: "Are you having difficulty breathing?" If Yes, ask: "How bad is it?" (e.g., mild, moderate, severe)    - MILD: No SOB at rest, mild SOB with walking, speaks normally in sentences, can lie down, no retractions, pulse < 100.    - MODERATE: SOB at rest, SOB with minimal  exertion and prefers to sit, cannot lie down flat, speaks in phrases, mild retractions, audible wheezing, pulse 100-120.    - SEVERE: Very SOB at rest, speaks in single words, struggling to breathe, sitting hunched forward, retractions, pulse > 120      Uses nebulizer- helps breathing but not the cough 6. FEVER: "Do you have a fever?" If Yes, ask: "What is your temperature, how was it measured, and when did it start?"     no 7. CARDIAC HISTORY: "Do you have any history of heart disease?" (e.g., heart attack, congestive heart failure)      no 8. LUNG HISTORY: "Do you have any history of lung disease?"  (e.g., pulmonary embolus, asthma, emphysema)     COPD, asthma 9. PE RISK FACTORS: "Do you have a history of blood clots?" (or: recent major surgery, recent prolonged travel, bedridden)     no 10. OTHER SYMPTOMS: "Do you have any other symptoms?" (e.g., runny nose, wheezing, chest pain)       Chest pain due to cough  Protocols used: Cough - Acute Productive-A-AH

## 2022-07-13 NOTE — Telephone Encounter (Signed)
  Chief Complaint: medication request Symptoms: antibiotic and cough medication requested Frequency: chronic cough- patient was seen in office 06/29/22- not treated at that time Pertinent Negatives: Patient denies   Disposition: '[]'$ ED /'[]'$ Urgent Care (no appt availability in office) / '[]'$ Appointment(In office/virtual)/ '[]'$  [place] [facility]/ '[]'$ Home Care/ '[]'$ Refused Recommended Disposition /'[]'$ [place] [facility]/ '[x]'$  Follow-up with PCP Additional Notes: Daughter called back to see if provider is going to send medication- advised provider has left for the day- note came in late enough that provider did not address before leaving- will be Monday. Other providers will not write Rx for patient they have not evaluated- may take patient to UC over the weekend if needed- but medication request will not be reviewed until provider is back in office.

## 2022-07-16 ENCOUNTER — Other Ambulatory Visit: Payer: Self-pay | Admitting: Family Medicine

## 2022-07-16 MED ORDER — BENZONATATE 100 MG PO CAPS: 100.0000 mg | ORAL_CAPSULE | Freq: Two times a day (BID) | ORAL | 0 refills | Status: DC | PRN

## 2022-07-16 NOTE — Addendum Note (Signed)
Addended by: Shawna Orleans on: 07/16/2022 11:46 AM   Modules accepted: Orders

## 2022-07-16 NOTE — Telephone Encounter (Signed)
Ok to send in Bartlett '100mg'$  BID prn cough #30 r0

## 2022-07-16 NOTE — Telephone Encounter (Signed)
Patient's daughter advised medication was sent to [facility].

## 2022-07-17 MED ORDER — AMLODIPINE BESYLATE 5 MG PO TABS: 5.0000 mg | ORAL_TABLET | Freq: Every day | ORAL | 1 refills | Status: DC

## 2022-07-17 MED ORDER — CARVEDILOL 12.5 MG PO TABS: 12.5000 mg | ORAL_TABLET | Freq: Two times a day (BID) | ORAL | 0 refills | Status: DC

## 2022-07-18 ENCOUNTER — Telehealth: Payer: Self-pay | Admitting: Family Medicine

## 2022-07-18 MED ORDER — BENZONATATE 100 MG PO CAPS: 100.0000 mg | ORAL_CAPSULE | Freq: Two times a day (BID) | ORAL | 0 refills | Status: DC | PRN

## 2022-07-18 NOTE — Telephone Encounter (Signed)
Pt is wanting to know if PCP can call her in another dose of azithromycin (ZITHROMAX) tablet 500 mg   Per pt she is still coughing and states that she was told yesterday the medication would be called into her Pharmacy. Per pt her Pharmacy states that they do not have the prescription.  [id number] [address], [address] Phone: [phone]  Fax: [phone]      Please advise

## 2022-07-18 NOTE — Telephone Encounter (Signed)
Patient advised Dr. Jacinto Reap prescribed Tessalon. However, prescription did not go through. Resent prescription to [facility].

## 2022-07-24 ENCOUNTER — Encounter: Payer: Self-pay | Admitting: Internal Medicine

## 2022-07-24 ENCOUNTER — Ambulatory Visit (INDEPENDENT_AMBULATORY_CARE_PROVIDER_SITE_OTHER): Payer: Medicaid Other | Admitting: Internal Medicine

## 2022-07-24 VITALS — BP 126/60 | HR 96 | Temp 97.7°F | Ht 61.0 in | Wt 163.6 lb

## 2022-07-24 DIAGNOSIS — J45909 Unspecified asthma, uncomplicated: Secondary | ICD-10-CM | POA: Diagnosis not present

## 2022-07-24 DIAGNOSIS — R0689 Other abnormalities of breathing: Secondary | ICD-10-CM

## 2022-07-24 MED ORDER — PREDNISONE 20 MG PO TABS: 20.0000 mg | ORAL_TABLET | Freq: Every day | ORAL | 1 refills | Status: DC

## 2022-07-24 NOTE — Progress Notes (Signed)
* [facility]  Date: 07/24/2022  MRN# [id number] Whitney Fleming 31-Dec-1945  Whitney Fleming is a 77 y.o. old female seen in follow up for chief complaint of dyspnea.   SYNOPSIS Whitney Fleming is a 77 y.o. female  with severe allergic asthma.  She speaks [place] her daughter translates.  She has had 3 admissions to the hospital in 2019 with asthma exacerbations including most recently in September 2019.  She had not had further hospital admissions thus far this year but required 2 courses of prednisone in May 2020 which caused her blood sugar to spike.   She previously had been evaluated for an injectable biologic agent for asthma but declined because she did not want injections. She had started MAINTENANCE INHALED NEB THERAPY  We previously considered her for Nucala but PATIENT declined anything that involve injections.    **IgE 12/24/17; IGe 5.  **CXR 11/05/17; changes of chronic bronchitis, right heart enlargement.  **CBC 02/11/18>> absolute eosinophil count 800. **CBC 09/25/17; Abs eos count 300.    CC Follow up ASTHMA   HPI Chronic SOB/DOE has significantly improved with bronchodilator therapy with nebulizers No longer uses traditional inhaler therapy  Most of her history is provided by her daughter  ONO WNL  No exacerbation at this time No evidence of heart failure at this time No evidence or signs of infection at this time No respiratory distress No fevers, chills, nausea, vomiting, diarrhea No evidence of lower extremity edema No evidence hemoptysis  Patient returned from [place] since 3 weeks ago and had acute COPD exacerbation asthma exacerbation which was treated with oral antibiotics with azithromycin and prednisone therapy which has significantly helped her cough, has been taking [employer] which has helped but will prescribe prednisone at this time for an additional 5 days  Medication:    Current Outpatient Medications:    Accu-Chek FastClix  Lancets MISC, Use as directed to check blood glucose twice daily, Disp: 100 each, Rfl: 1   albuterol (PROVENTIL) (2.5 MG/3ML) 0.083% nebulizer solution, Take 3 mLs (2.5 mg total) by nebulization every 6 (six) hours as needed for wheezing or shortness of breath., Disp: 1008 mL, Rfl: 1   albuterol (VENTOLIN HFA) 108 (90 Base) MCG/ACT inhaler, Inhale 2 puffs into the lungs every 6 (six) hours as needed for wheezing or shortness of breath., Disp: 8 g, Rfl: 2   AMBULATORY NON FORMULARY MEDICATION, Medication Name: Aero chamber use as directed. DX:J45.909, Disp: 1 each, Rfl: 0   amLODipine (NORVASC) 5 MG tablet, Take 1 tablet (5 mg total) by mouth daily., Disp: 90 tablet, Rfl: 1   arformoterol (BROVANA) 15 MCG/2ML NEBU, Take 2 mLs (15 mcg total) by nebulization in the morning and at bedtime., Disp: 360 mL, Rfl: 2   aspirin EC 81 MG tablet, Take 1 tablet (81 mg total) by mouth daily. Swallow whole., Disp: 90 tablet, Rfl: 3   benzonatate (TESSALON) 100 MG capsule, Take 1 capsule (100 mg total) by mouth 2 (two) times daily as needed for cough., Disp: 30 capsule, Rfl: 0   budesonide (PULMICORT) 0.5 MG/2ML nebulizer solution, Take 2 mLs (0.5 mg total) by nebulization 2 (two) times daily., Disp: 360 mL, Rfl: 2   carvedilol (COREG) 12.5 MG tablet, Take 1 tablet (12.5 mg total) by mouth 2 (two) times daily with a meal., Disp: 180 tablet, Rfl: 0   empagliflozin (JARDIANCE) 25 MG TABS tablet, Take 1 tablet (25 mg total) by mouth daily., Disp: 90 tablet, Rfl: 1   glucose  blood (ACCU-CHEK GUIDE) test strip, USE  STRIP TO CHECK GLUCOSE TWICE DAILY AS DIRECTED, Disp: 100 each, Rfl: 0   insulin glargine (LANTUS SOLOSTAR) 100 UNIT/ML Solostar Pen, Inject 20 Units into the skin daily. Decrease to 15 units if sugars less then 200, Disp: 15 mL, Rfl: 5   Insulin Pen Needle (NOVOFINE PEN NEEDLE) 32G X 6 MM MISC, Use as directed to inject insulin, Disp: 100 each, Rfl: 1   ipratropium-albuterol (DUONEB) 0.5-2.5 (3) MG/3ML SOLN,  Take 3 mLs by nebulization every 6 (six) hours as needed., Disp: 360 mL, Rfl: 1   lisinopril (ZESTRIL) 20 MG tablet, Take 20 mg by mouth daily., Disp: , Rfl:    metFORMIN (GLUCOPHAGE) 1000 MG tablet, TAKE 1 TABLET BY MOUTH TWICE DAILY WITH A MEAL, Disp: 180 tablet, Rfl: 1   montelukast (SINGULAIR) 10 MG tablet, Take 1 tablet (10 mg total) by mouth at bedtime., Disp: 90 tablet, Rfl: 3   rosuvastatin (CRESTOR) 5 MG tablet, Take 1 tablet (5 mg total) by mouth daily., Disp: 90 tablet, Rfl: 1   sitaGLIPtin (JANUVIA) 100 MG tablet, Take 1 tablet (100 mg total) by mouth daily., Disp: 90 tablet, Rfl: 0   Allergies:  Patient has no known allergies.    BP 126/60 (BP Location: Left Arm, Cuff Size: Large)   Pulse 96   Temp 97.7 F (36.5 C) (Temporal)   Ht 5' 1"$  (1.549 m)   Wt 163 lb 9.6 oz (74.2 kg)   SpO2 96%   BMI 30.91 kg/m        Review of Systems: Gen:  Denies  fever, sweats, chills weight loss  HEENT: Denies blurred vision, double vision, ear pain, eye pain, hearing loss, nose bleeds, sore throat Cardiac:  No dizziness, chest pain or heaviness, chest tightness,edema, No JVD Resp:   No cough, -sputum production, -shortness of breath,-wheezing, -hemoptysis,  Other:  All other systems negative    Physical Examination:   General Appearance: No distress  EYES PERRLA, EOM intact.   NECK Supple, No JVD Pulmonary: normal breath sounds, No wheezing.  CardiovascularNormal S1,S2.  No m/r/g.   Abdomen: Benign, Soft, non-tender. EXT +edema ALL OTHER ROS ARE NEGATIVE     Assessment and Plan:  77 year old [place] female with chronic severe persistent asthma currently on Pulmicort and Brovana nebulizers due to the fact that she has respiratory insufficiency and can no longer take traditional inhaler therapy with chronic severe persistent asthma in the setting of coronary artery disease  Nebulized therapy has significantly improved her chronic severe persistent asthma Seems like  symptoms are under control with Pulmicort twice daily and Brovana twice daily   Dyspnea exertion and shortness of breath related to her obesity underlying lung disease with undiagnosed CAD diastolic heart failure Follow-up with cardiology as controlled Overall condition has improved with time and nebulized therapy    MEDICATION ADJUSTMENTS/LABS AND TESTS ORDERED: Pulmicort nebulizer 2 times per day Brovana  2 times per day DOUNEBS NEBULIZER every 4 hrs as needed PLEASE STOP LISINOPRIL-IT IS KNOWN TO CAUSE COUGH RECOMMEND TALKING TO PCP TO ADDRESS ALTERNATIVE  THERAPY IF NORVASC DOES NOT WORK-CAN ALSO CAUSE SWELLING IN LEGS   CURRENT MEDICATIONS REVIEWED AT LENGTH WITH PATIENT TODAY   Follow-up in 6 months  Total time spent 35 minutes   Georgeana Oertel Patricia Pesa, M.D.  [facility]  Medical Director [place] Director [facility] Cardio-Pulmonary Department

## 2022-07-24 NOTE — Patient Instructions (Addendum)
Pulmicort nebulizer 2 times per day Brovana  2 times per day DOUNEBS NEBULIZER every 4 hrs as needed  PLEASE STOP LISINOPRIL -IT IS KNOWN TO CAUSE COUGH RECOMMEND TALKING TO PCP TO ADDRESS ALTERNATIVE  THERAPY IF NORVASC DOES NOT WORK-CAN ALSO CAUSE SWELLING IN LEGS  FOLLOW UP WITH CARDIOLOGY

## 2022-08-15 ENCOUNTER — Telehealth: Payer: Self-pay | Admitting: Internal Medicine

## 2022-08-15 NOTE — Telephone Encounter (Signed)
Manisha daughter states patient needs new RX for Symbicort. Pharmacy is [facility] [address]. [place] phone number is [phone].

## 2022-08-15 NOTE — Telephone Encounter (Signed)
Spoke to patient's daughter, Manisha(DPR). She stated that patient will be flying out of the country later this month. She would like a Rx for Symbicort to use on the plane since she can not use a nebulizer on the plane. Patient currently takes Brovana and Pulmicort.  Dr. Mortimer Fries, please advise. thanks

## 2022-08-16 MED ORDER — BUDESONIDE-FORMOTEROL FUMARATE 160-4.5 MCG/ACT IN AERO: 2.0000 | INHALATION_SPRAY | Freq: Two times a day (BID) | RESPIRATORY_TRACT | 0 refills | Status: DC

## 2022-08-16 NOTE — Telephone Encounter (Signed)
Rx sent to preferred pharmacy.  Lm for patient's daughter, Manisha(DPR)

## 2022-08-16 NOTE — Telephone Encounter (Signed)
Dr. Mortimer Fries, please verify dosage for Symbicort. Thanks

## 2022-08-17 NOTE — Telephone Encounter (Signed)
Patient's daughter is aware of below message and voiced her understanding.  Nothing further needed.

## 2022-09-09 ENCOUNTER — Other Ambulatory Visit: Payer: Self-pay | Admitting: Family Medicine

## 2022-09-09 IMAGING — US US RENAL
1 series · 14 of 25 positions shown · non-contrast
Comparison: None.

CLINICAL DATA: Type 2 diabetes with chronic kidney disease

EXAM:
RENAL / URINARY TRACT ULTRASOUND COMPLETE

[Series 1: us renal · 14 of 27 slices shown]
[im 1/27]
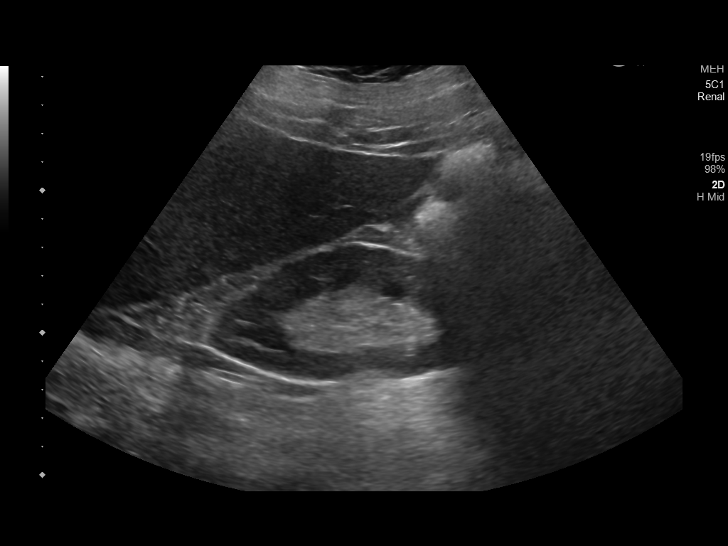
[im 3/27]
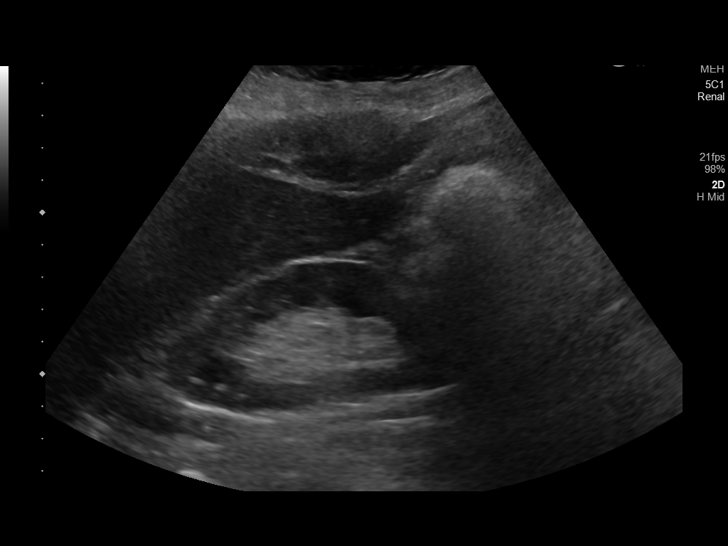
[im 5/27]
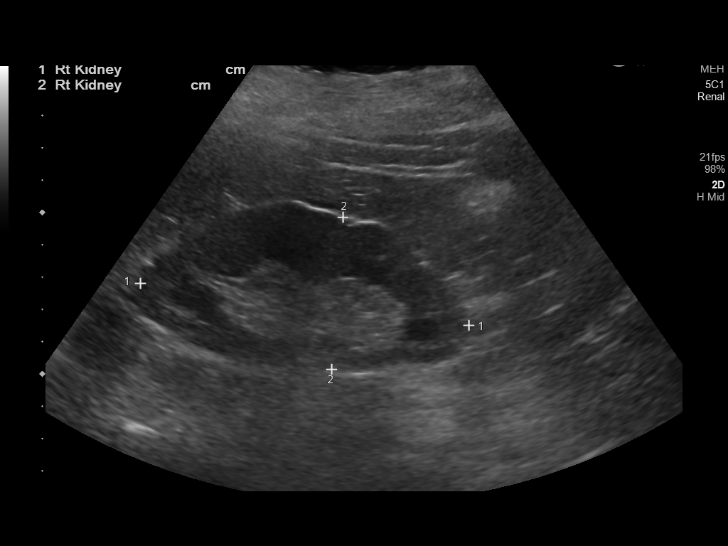
[im 7/27]
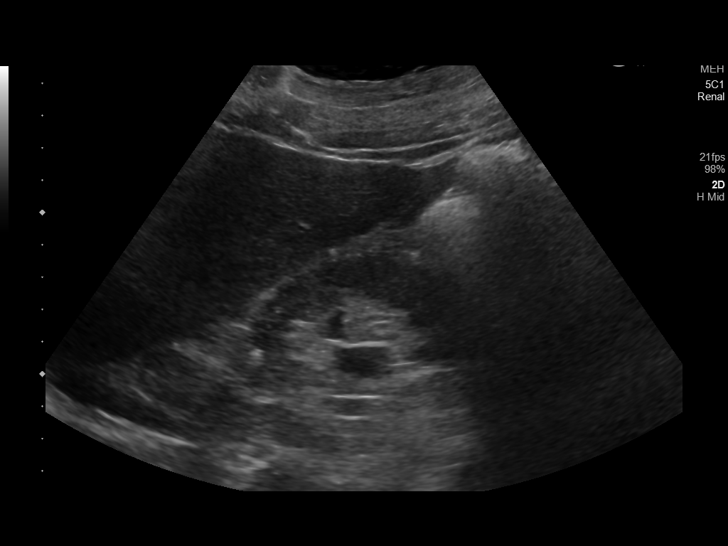
[im 9/27]
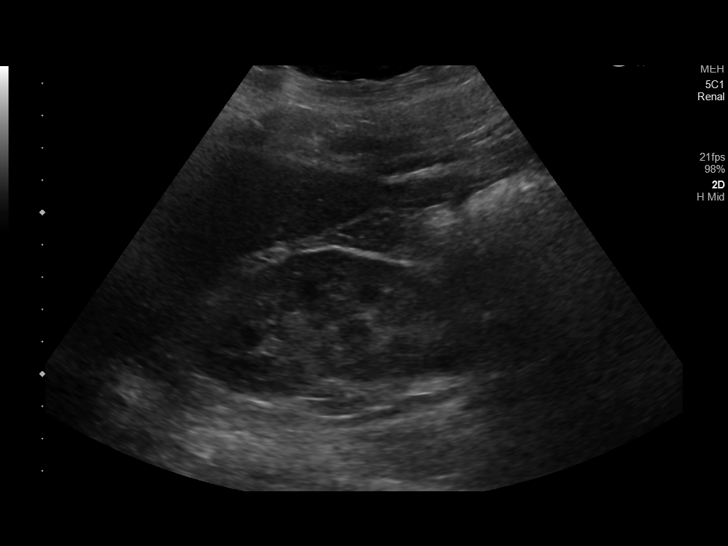
[im 10/27]
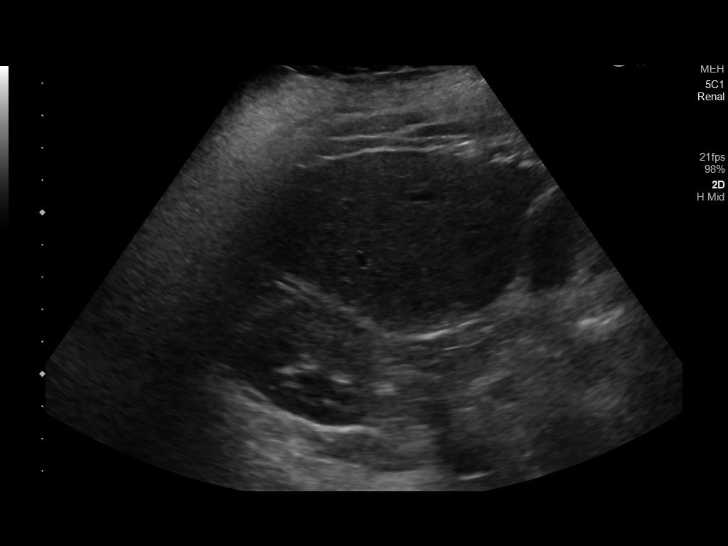
[im 12/27]
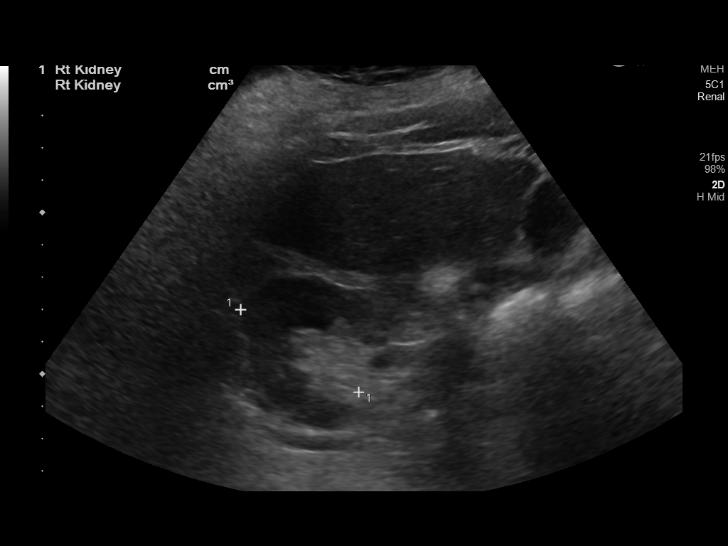
[im 15/27]
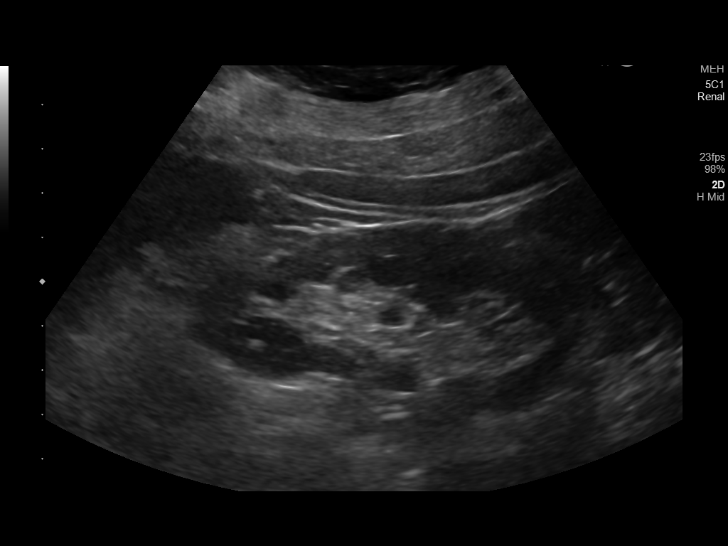
[im 17/27]
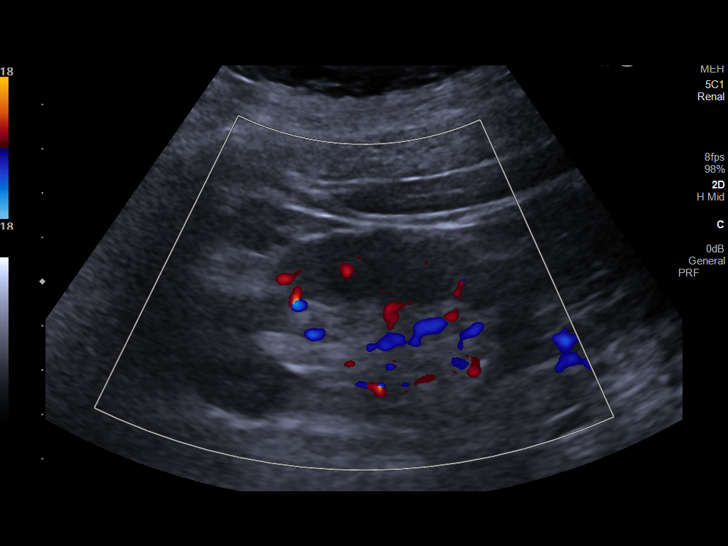
[im 18/27]
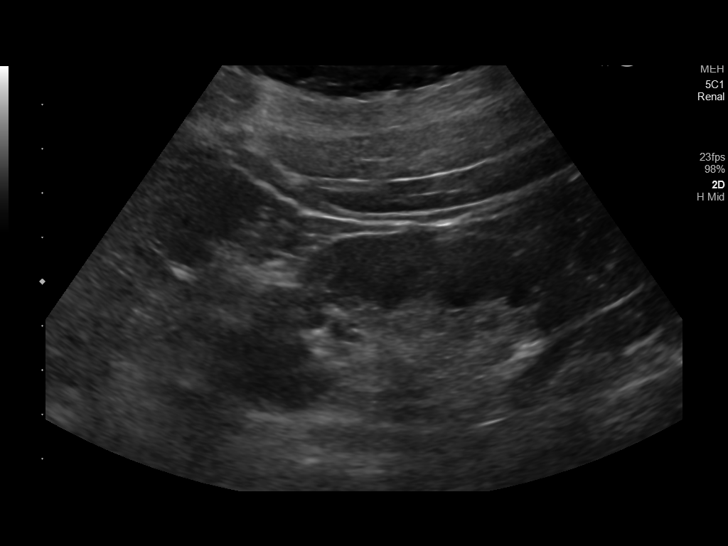
[im 20/27]
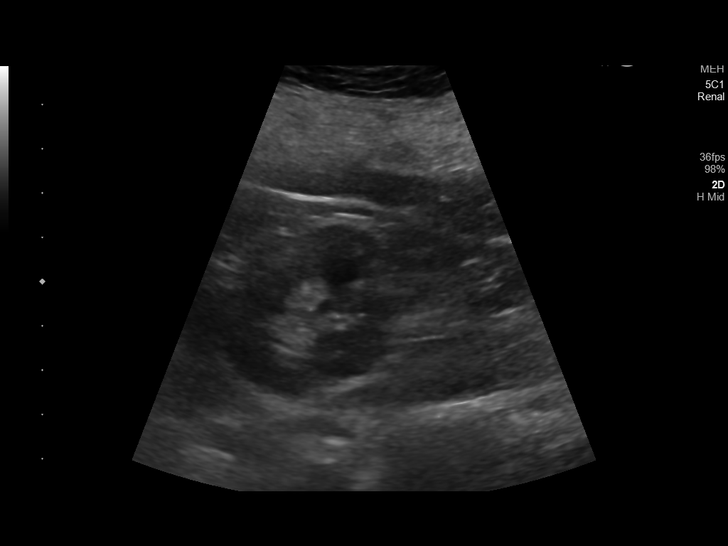
[im 22/27]
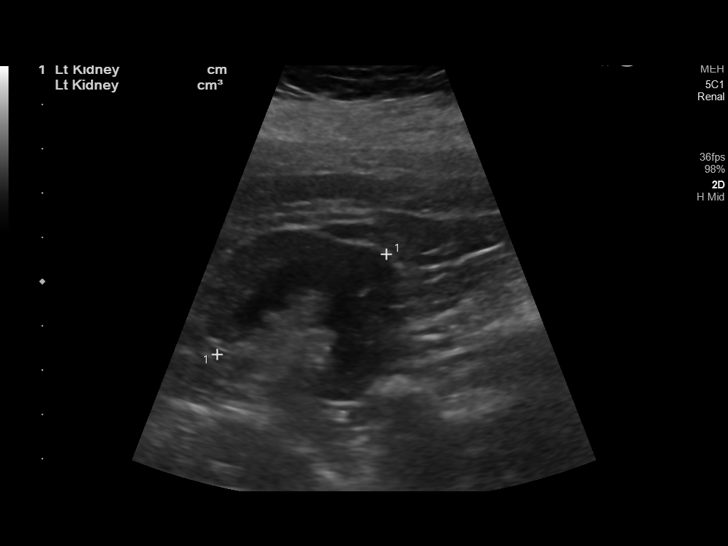
[im 24/27]
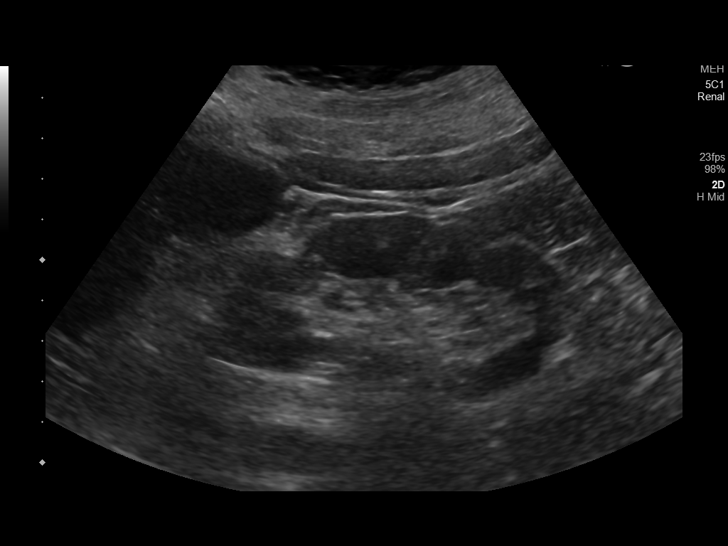
[im 27/27]
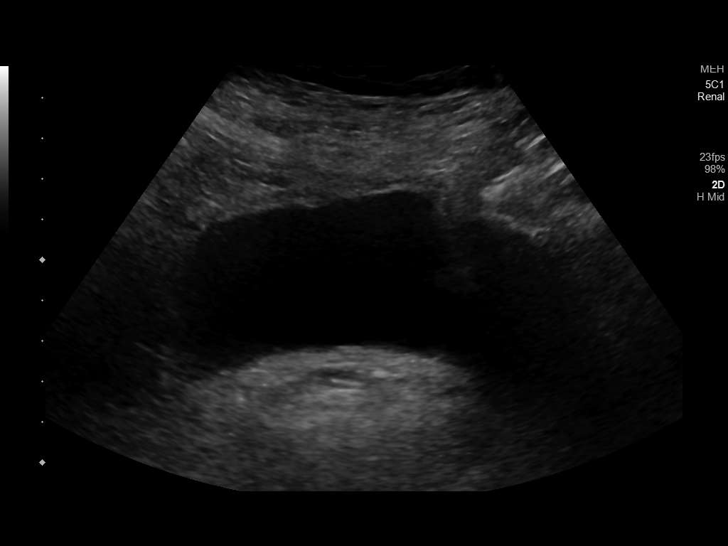

[14 of 25 positions shown; findings below may reference images not displayed]

FINDINGS: Right Kidney:

Renal measurements: 10.3 x 4.7 x 4.5 cm = volume: 112.9 mL.
Echogenicity within normal limits. No mass or hydronephrosis
visualized.

Left Kidney:

Renal measurements: 9.2 x 3.8 x 4.4 cm = volume: 81.4 mL.
Echogenicity within normal limits. No mass or hydronephrosis
visualized.

Bladder:

Appears normal for degree of bladder distention.

Other:

None.
IMPRESSION: Negative renal ultrasound

## 2022-09-16 ENCOUNTER — Other Ambulatory Visit: Payer: Self-pay | Admitting: Internal Medicine

## 2022-09-17 ENCOUNTER — Encounter: Payer: Self-pay | Admitting: Family Medicine

## 2022-09-17 ENCOUNTER — Other Ambulatory Visit: Payer: Self-pay | Admitting: Internal Medicine

## 2022-09-17 ENCOUNTER — Ambulatory Visit (INDEPENDENT_AMBULATORY_CARE_PROVIDER_SITE_OTHER): Payer: Medicare Other | Admitting: Family Medicine

## 2022-09-17 ENCOUNTER — Other Ambulatory Visit: Payer: Self-pay

## 2022-09-17 VITALS — BP 114/70 | HR 88 | Temp 97.6°F | Resp 12 | Wt 159.0 lb

## 2022-09-17 DIAGNOSIS — E669 Obesity, unspecified: Secondary | ICD-10-CM

## 2022-09-17 DIAGNOSIS — E1159 Type 2 diabetes mellitus with other circulatory complications: Secondary | ICD-10-CM | POA: Diagnosis not present

## 2022-09-17 DIAGNOSIS — J411 Mucopurulent chronic bronchitis: Secondary | ICD-10-CM

## 2022-09-17 DIAGNOSIS — I152 Hypertension secondary to endocrine disorders: Secondary | ICD-10-CM

## 2022-09-17 DIAGNOSIS — E1165 Type 2 diabetes mellitus with hyperglycemia: Secondary | ICD-10-CM | POA: Diagnosis not present

## 2022-09-17 DIAGNOSIS — K219 Gastro-esophageal reflux disease without esophagitis: Secondary | ICD-10-CM

## 2022-09-17 DIAGNOSIS — J45909 Unspecified asthma, uncomplicated: Secondary | ICD-10-CM | POA: Diagnosis not present

## 2022-09-17 DIAGNOSIS — Z6830 Body mass index (BMI) 30.0-30.9, adult: Secondary | ICD-10-CM

## 2022-09-17 LAB — POCT GLYCOSYLATED HEMOGLOBIN (HGB A1C)
Est. average glucose Bld gHb Est-mCnc: 266
Hemoglobin A1C: 10.9 % — AB (ref 4.0–5.6)

## 2022-09-17 MED ORDER — METFORMIN HCL 1000 MG PO TABS: 1000.0000 mg | ORAL_TABLET | Freq: Two times a day (BID) | ORAL | 1 refills | Status: DC

## 2022-09-17 MED ORDER — MONTELUKAST SODIUM 10 MG PO TABS: 10.0000 mg | ORAL_TABLET | Freq: Every day | ORAL | 3 refills | Status: DC

## 2022-09-17 MED ORDER — SITAGLIPTIN PHOSPHATE 100 MG PO TABS: 100.0000 mg | ORAL_TABLET | Freq: Every day | ORAL | 1 refills | Status: DC

## 2022-09-17 MED ORDER — AZITHROMYCIN 250 MG PO TABS: ORAL_TABLET | ORAL | 0 refills | Status: DC

## 2022-09-17 MED ORDER — PREDNISONE 20 MG PO TABS
20.0000 mg | ORAL_TABLET | Freq: Every day | ORAL | 0 refills | Status: AC
Start: 2022-09-17 — End: 2022-09-22

## 2022-09-17 MED ORDER — ROSUVASTATIN CALCIUM 5 MG PO TABS: 5.0000 mg | ORAL_TABLET | Freq: Every day | ORAL | 1 refills | Status: DC

## 2022-09-17 MED ORDER — AMLODIPINE BESYLATE 5 MG PO TABS: 5.0000 mg | ORAL_TABLET | Freq: Every day | ORAL | 1 refills | Status: DC

## 2022-09-17 MED ORDER — CARVEDILOL 12.5 MG PO TABS: 12.5000 mg | ORAL_TABLET | Freq: Two times a day (BID) | ORAL | 1 refills | Status: DC

## 2022-09-17 MED ORDER — BUDESONIDE-FORMOTEROL FUMARATE 160-4.5 MCG/ACT IN AERO: 3.0000 | INHALATION_SPRAY | Freq: Two times a day (BID) | RESPIRATORY_TRACT | 0 refills | Status: DC

## 2022-09-17 MED ORDER — LANTUS SOLOSTAR 100 UNIT/ML ~~LOC~~ SOPN
25.0000 [IU] | PEN_INJECTOR | Freq: Every day | SUBCUTANEOUS | 5 refills | Status: DC
Start: 2022-09-17 — End: 2022-09-25

## 2022-09-17 MED ORDER — EMPAGLIFLOZIN 25 MG PO TABS: 25.0000 mg | ORAL_TABLET | Freq: Every day | ORAL | 1 refills | Status: DC

## 2022-09-17 NOTE — Progress Notes (Signed)
I,Sulibeya S Dimas,acting as a Neurosurgeon for Shirlee Latch, MD.,have documented all relevant documentation on the behalf of Shirlee Latch, MD,as directed by  Shirlee Latch, MD while in the presence of Shirlee Latch, MD.   Established patient visit   Patient: Whitney Fleming   DOB: 1945/11/26   77 y.o. Female  MRN: [id number] Visit Date: 09/17/2022  Today's healthcare provider: Shirlee Latch, MD   Chief Complaint  Patient presents with   Diabetes   Hypertension   Hyperlipidemia   Subjective    HPI  Diabetes Mellitus Type II, follow-up  Lab Results  Component Value Date   HGBA1C 10.9 (A) 09/17/2022   HGBA1C 9.2 (A) 06/25/2022   HGBA1C 11.7 (H) 12/11/2021   Last seen for diabetes 3 months ago.  Management since then includes increase basal insulin dose to 20 units daily.  She reports excellent compliance with treatment. She is not having side effects.   Home blood sugar records: fasting range: 250-300  Episodes of hypoglycemia? No    Current insulin regiment: basal insulin 20 units daily.   --------------------------------------------------------------------------------------------------- Hypertension, follow-up  BP Readings from Last 3 Encounters:  09/17/22 114/70  07/24/22 126/60  06/25/22 123/71   Wt Readings from Last 3 Encounters:  09/17/22 159 lb (72.1 kg)  07/24/22 163 lb 9.6 oz (74.2 kg)  06/25/22 167 lb 11.2 oz (76.1 kg)     She was last seen for hypertension 3 months ago.  BP at that visit was 132/71 . Management since that visit includes no changes. She reports excellent compliance with treatment. She is not having side effects.   Outside blood pressures are stable.  She does not smoke. ---------------------------------------------------------------------------------------------------   Medications: Outpatient Medications Prior to Visit  Medication Sig   Accu-Chek FastClix Lancets MISC Use as directed to check blood glucose  twice daily   AMBULATORY NON FORMULARY MEDICATION Medication Name: Aero chamber use as directed. DX:J45.909   arformoterol (BROVANA) 15 MCG/2ML NEBU Take 2 mLs (15 mcg total) by nebulization in the morning and at bedtime.   aspirin EC 81 MG tablet Take 1 tablet (81 mg total) by mouth daily. Swallow whole.   benzonatate (TESSALON) 100 MG capsule Take 1 capsule (100 mg total) by mouth 2 (two) times daily as needed for cough.   budesonide (PULMICORT) 0.5 MG/2ML nebulizer solution Take 2 mLs (0.5 mg total) by nebulization 2 (two) times daily.   budesonide-formoterol (SYMBICORT) 160-4.5 MCG/ACT inhaler Inhale 3 puffs into the lungs 2 (two) times daily.   glucose blood (ACCU-CHEK GUIDE) test strip USE  STRIP TO CHECK GLUCOSE TWICE DAILY AS DIRECTED   Insulin Pen Needle (NOVOFINE PEN NEEDLE) 32G X 6 MM MISC Use as directed to inject insulin   ipratropium-albuterol (DUONEB) 0.5-2.5 (3) MG/3ML SOLN Take 3 mLs by nebulization every 6 (six) hours as needed.   [DISCONTINUED] amLODipine (NORVASC) 5 MG tablet Take 1 tablet (5 mg total) by mouth daily.   [DISCONTINUED] carvedilol (COREG) 12.5 MG tablet Take 1 tablet (12.5 mg total) by mouth 2 (two) times daily with a meal.   [DISCONTINUED] empagliflozin (JARDIANCE) 25 MG TABS tablet Take 1 tablet (25 mg total) by mouth daily.   [DISCONTINUED] insulin glargine (LANTUS SOLOSTAR) 100 UNIT/ML Solostar Pen Inject 20 Units into the skin daily. Decrease to 15 units if sugars less then 200   [DISCONTINUED] metFORMIN (GLUCOPHAGE) 1000 MG tablet TAKE 1 TABLET BY MOUTH TWICE DAILY WITH A MEAL   [DISCONTINUED] montelukast (SINGULAIR) 10 MG tablet Take 1 tablet (10 mg  total) by mouth at bedtime.   [DISCONTINUED] predniSONE (DELTASONE) 20 MG tablet Take 1 tablet (20 mg total) by mouth daily with breakfast. 10 days   [DISCONTINUED] rosuvastatin (CRESTOR) 5 MG tablet Take 1 tablet (5 mg total) by mouth daily.   [DISCONTINUED] sitaGLIPtin (JANUVIA) 100 MG tablet TAKE 1  TABLET(100 MG) BY MOUTH DAILY   No facility-administered medications prior to visit.    Review of Systems     Objective    BP 114/70 (BP Location: Left Arm, Patient Position: Sitting, Cuff Size: Large)   Pulse 88   Temp 97.6 F (36.4 C) (Temporal)   Resp 12   Wt 159 lb (72.1 kg)   SpO2 99%   BMI 30.04 kg/m    Physical Exam Vitals reviewed.  Constitutional:      General: She is not in acute distress.    Appearance: Normal appearance. She is well-developed. She is not diaphoretic.  HENT:     Head: Normocephalic and atraumatic.  Eyes:     General: No scleral icterus.    Conjunctiva/sclera: Conjunctivae normal.  Neck:     Thyroid: No thyromegaly.  Cardiovascular:     Rate and Rhythm: Normal rate and regular rhythm.     Heart sounds: Normal heart sounds. No murmur heard. Pulmonary:     Effort: Pulmonary effort is normal. No respiratory distress.     Breath sounds: Normal breath sounds. No wheezing, rhonchi or rales.  Musculoskeletal:     Cervical back: Neck supple.     Right lower leg: No edema.     Left lower leg: No edema.  Lymphadenopathy:     Cervical: No cervical adenopathy.  Skin:    General: Skin is warm and dry.     Findings: No rash.  Neurological:     Mental Status: She is alert and oriented to person, place, and time. Mental status is at baseline.  Psychiatric:        Mood and Affect: Mood normal.        Behavior: Behavior normal.       Results for orders placed or performed in visit on 09/17/22  POCT glycosylated hemoglobin (Hb A1C)  Result Value Ref Range   Hemoglobin A1C 10.9 (A) 4.0 - 5.6 %   Est. average glucose Bld gHb Est-mCnc 266     Assessment & Plan     Problem List Items Addressed This Visit       Cardiovascular and Mediastinum   Hypertension associated with diabetes    Well controlled Continue current medications Recheck metabolic panel at next visit F/u in 3 months       Relevant Medications   insulin glargine (LANTUS  SOLOSTAR) 100 UNIT/ML Solostar Pen   amLODipine (NORVASC) 5 MG tablet   carvedilol (COREG) 12.5 MG tablet   empagliflozin (JARDIANCE) 25 MG TABS tablet   metFORMIN (GLUCOPHAGE) 1000 MG tablet   rosuvastatin (CRESTOR) 5 MG tablet   sitaGLIPtin (JANUVIA) 100 MG tablet     Respiratory   Asthma    F/b Pulm Note given to travel with nebulizer      Relevant Medications   predniSONE (DELTASONE) 20 MG tablet   montelukast (SINGULAIR) 10 MG tablet   COPD (chronic obstructive pulmonary disease)    Chronic and stbale F/b Pulm Rx for prednisone and Azithro to travel with just in case      Relevant Medications   predniSONE (DELTASONE) 20 MG tablet   azithromycin (ZITHROMAX) 250 MG tablet   montelukast (SINGULAIR) 10  MG tablet     Digestive   GERD (gastroesophageal reflux disease)    Ok to take pepcid OTC        Endocrine   Diabetes - Primary    Chronic and uncontrolled with hyperglycemia Associated with hypertension and hyperlipidemia UACR and foot exam today Continue oral medications  Increase basal insulin dose to 25 units daily She can continue to use 30 units daily when she is on a course of prednisone as this does worsen her hyperglycemia Follow-up in 3 months and repeat A1c      Relevant Medications   insulin glargine (LANTUS SOLOSTAR) 100 UNIT/ML Solostar Pen   empagliflozin (JARDIANCE) 25 MG TABS tablet   metFORMIN (GLUCOPHAGE) 1000 MG tablet   rosuvastatin (CRESTOR) 5 MG tablet   sitaGLIPtin (JANUVIA) 100 MG tablet   Other Relevant Orders   Urine Microalbumin w/creat. ratio   AMB Referral to Pharmacy Medication Management   POCT glycosylated hemoglobin (Hb A1C) (Completed)     Other   Obesity    Discussed importance of healthy weight management Discussed diet and exercise       Relevant Medications   insulin glargine (LANTUS SOLOSTAR) 100 UNIT/ML Solostar Pen   empagliflozin (JARDIANCE) 25 MG TABS tablet   metFORMIN (GLUCOPHAGE) 1000 MG tablet    sitaGLIPtin (JANUVIA) 100 MG tablet     Return in about 3 months (around 12/17/2022) for CPE, AWV.      I, Shirlee Latch, MD, have reviewed all documentation for this visit. The documentation on 09/17/22 for the exam, diagnosis, procedures, and orders are all accurate and complete.   Mar Walmer, Marzella Schlein, MD, MPH [facility]

## 2022-09-17 NOTE — Assessment & Plan Note (Signed)
Chronic and stbale F/b Pulm Rx for prednisone and Azithro to travel with just in case

## 2022-09-17 NOTE — Assessment & Plan Note (Signed)
Chronic and uncontrolled with hyperglycemia Associated with hypertension and hyperlipidemia UACR and foot exam today Continue oral medications  Increase basal insulin dose to 25 units daily She can continue to use 30 units daily when she is on a course of prednisone as this does worsen her hyperglycemia Follow-up in 3 months and repeat A1c

## 2022-09-17 NOTE — Assessment & Plan Note (Signed)
Ok to take pepcid OTC

## 2022-09-17 NOTE — Assessment & Plan Note (Signed)
Discussed importance of healthy weight management Discussed diet and exercise  

## 2022-09-17 NOTE — Assessment & Plan Note (Signed)
Well controlled Continue current medications Recheck metabolic panel at next visit F/u in 3 months  

## 2022-09-17 NOTE — Assessment & Plan Note (Signed)
F/b Pulm Note given to travel with nebulizer

## 2022-09-18 LAB — MICROALBUMIN / CREATININE URINE RATIO
Creatinine, Urine: 24.7 mg/dL
Microalb/Creat Ratio: 83 mg/g creat — ABNORMAL HIGH (ref 0–29)
Microalbumin, Urine: 20.5 ug/mL

## 2022-09-18 LAB — SPECIMEN STATUS REPORT

## 2022-09-19 ENCOUNTER — Other Ambulatory Visit (HOSPITAL_COMMUNITY): Payer: Self-pay

## 2022-09-20 ENCOUNTER — Other Ambulatory Visit (HOSPITAL_COMMUNITY): Payer: Self-pay

## 2022-09-22 IMAGING — DX DG CHEST 1V PORT
1 series · 1 of 1 positions shown · non-contrast
Comparison: 09/24/2020

CLINICAL DATA: Shortness of breath

EXAM:
PORTABLE CHEST 1 VIEW

[chest ap]
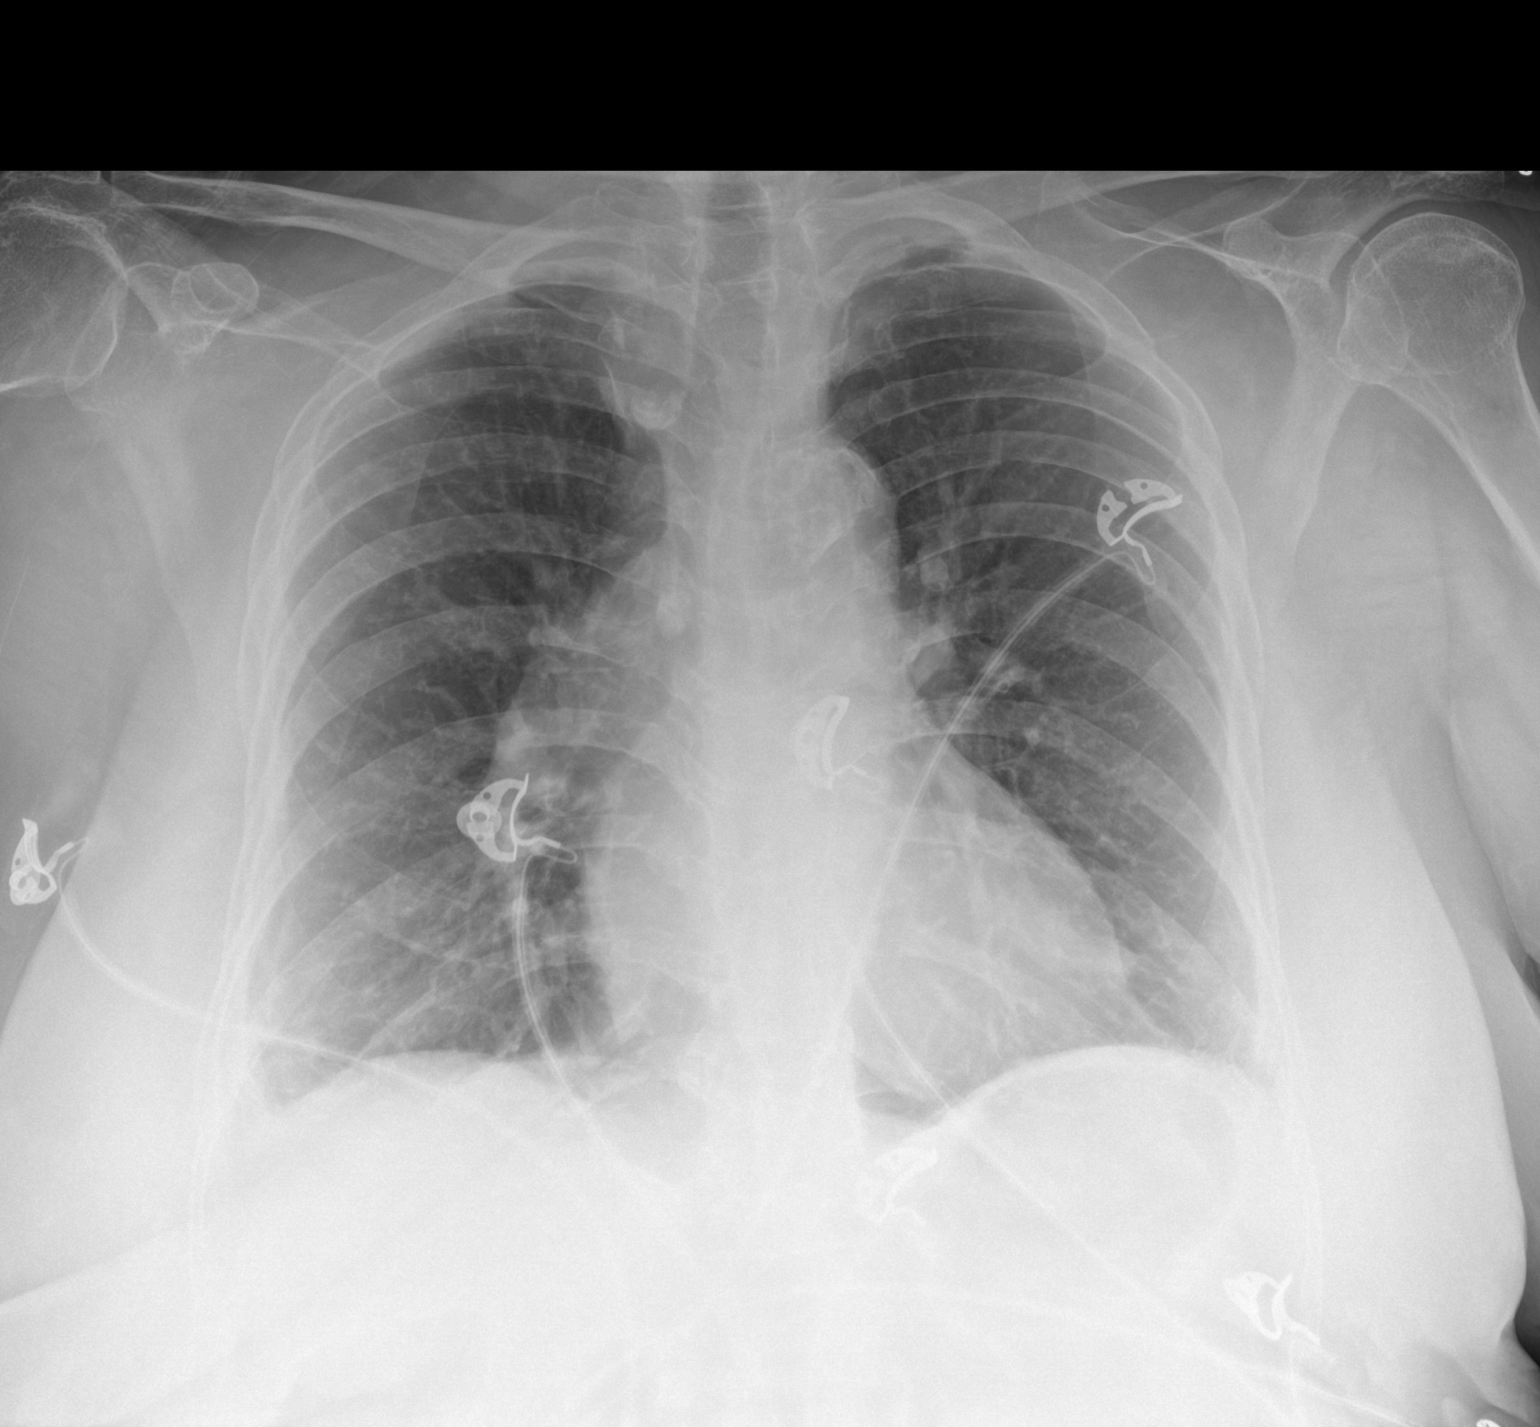

[1 of 1 positions shown; findings below may reference images not displayed]

FINDINGS: Subpleural reticulation and blunted appearance of the lateral
costophrenic sulci, chronic. There is no edema, consolidation,
effusion, or pneumothorax. Normal heart size and mediastinal
contours. Right paratracheal calcification from calcified adenopathy
and atheromatous plaque.
IMPRESSION: No acute finding when compared with priors.

## 2022-09-23 ENCOUNTER — Other Ambulatory Visit: Payer: Self-pay | Admitting: Family Medicine

## 2022-09-24 ENCOUNTER — Ambulatory Visit: Payer: Medicare Other | Admitting: Family Medicine

## 2022-09-24 ENCOUNTER — Telehealth: Payer: Self-pay | Admitting: Internal Medicine

## 2022-09-24 ENCOUNTER — Other Ambulatory Visit: Payer: Self-pay | Admitting: Internal Medicine

## 2022-09-24 DIAGNOSIS — J4550 Severe persistent asthma, uncomplicated: Secondary | ICD-10-CM

## 2022-09-24 NOTE — Telephone Encounter (Signed)
Pt daughter called in to get a refill for budesonide (PULMICORT) 0.5 MG/2ML nebulizer solution [[id number]]  Ph[facility]: [facility] [address]

## 2022-09-24 NOTE — Telephone Encounter (Signed)
We received a Drug Change Request fax from the patient's pharmacy. Her insurance will not cover the Budesonide.  Can you check and see what an alternative is that her insurance will cover? Thank you!

## 2022-09-25 ENCOUNTER — Other Ambulatory Visit: Payer: Self-pay | Admitting: Family Medicine

## 2022-09-25 ENCOUNTER — Telehealth: Payer: Self-pay

## 2022-09-25 DIAGNOSIS — E1165 Type 2 diabetes mellitus with hyperglycemia: Secondary | ICD-10-CM

## 2022-09-25 MED ORDER — LANTUS SOLOSTAR 100 UNIT/ML ~~LOC~~ SOPN
25.0000 [IU] | PEN_INJECTOR | Freq: Every day | SUBCUTANEOUS | 5 refills | Status: DC
Start: 2022-09-25 — End: 2022-11-09

## 2022-09-25 MED ORDER — BUDESONIDE 0.5 MG/2ML IN SUSP
0.5000 mg | Freq: Two times a day (BID) | RESPIRATORY_TRACT | 2 refills | Status: DC
Start: 2022-09-25 — End: 2022-09-27

## 2022-09-25 NOTE — Telephone Encounter (Signed)
Medication Refill - Medication: insulin glargine (LANTUS SOLOSTAR) 100 UNIT/ML Solostar Pen  Pt has one week of medication left.  Has the patient contacted their pharmacy? Yes.   Pt daughter is calling from the pharmacy.    Preferred Pharmacy (with phone number or street name):  [facility] [place], [place] - [address] Phone: [phone]  Fax: [phone]     Has the patient been seen for an appointment in the last year OR does the patient have an upcoming appointment? Yes.    Agent: Please be advised that RX refills may take up to 3 business days. We ask that you follow-up with your pharmacy.

## 2022-09-25 NOTE — Telephone Encounter (Signed)
Copied from CRM [id number]. Topic: General - Other >> Sep 25, 2022  8:48 AM Dondra Prader A wrote: Reason for CRM: Pt daughter is calling to make sure the right Medicaid insurance information is in the system. Per pt daughter the correct Medicaid is [facility] Medicaid ID# [id number]

## 2022-09-25 NOTE — Telephone Encounter (Signed)
I spoke with the patient's daughter. She is requesting the Budesonide be sent into [facility] [address]. I tried to explain that her insurance will not pay for it and she said she only wants this medication and does not want to change to another medication. I will send in the Budesonide.   Nothing further needed.

## 2022-09-25 NOTE — Telephone Encounter (Signed)
Sending to [facility] as it was refilled on 09/17/22 to a different pharmacy.  Requested Prescriptions  Pending Prescriptions Disp Refills   insulin glargine (LANTUS SOLOSTAR) 100 UNIT/ML Solostar Pen 15 mL 5    Sig: Inject 25 Units into the skin daily.     Endocrinology:  Diabetes - Insulins Failed - 09/25/2022 10:02 AM      Failed - HBA1C is between 0 and 7.9 and within 180 days    Hemoglobin A1C  Date Value Ref Range Status  09/17/2022 10.9 (A) 4.0 - 5.6 % Final  12/25/2012 7.9 (H) 4.2 - 6.3 % Final    Comment:    The American Diabetes Association recommends that a primary goal of therapy should be <7% and that physicians should reevaluate the treatment regimen in patients with HbA1c values consistently >8%.    Hgb A1c MFr Bld  Date Value Ref Range Status  12/11/2021 11.7 (H) 4.8 - 5.6 % Final    Comment:             Prediabetes: 5.7 - 6.4          Diabetes: >6.4          Glycemic control for adults with diabetes: <7.0          Passed - Valid encounter within last 6 months    Recent Outpatient Visits           1 week ago Type 2 diabetes mellitus with other circulatory complication, without long-term current use of insulin   [facility] [place], Marzella Schlein, MD   3 months ago Type 2 diabetes mellitus with other circulatory complication, without long-term current use of insulin [facility] [place])   [facility] [place], Marzella Schlein, MD   9 months ago Annual physical exam   [facility] [place] M, DO   1 year ago Type 2 diabetes mellitus with other circulatory complication, without long-term current use of insulin [facility])   [facility] [place], Marzella Schlein, MD   1 year ago Type 2 diabetes mellitus with hyperglycemia, with long-term current use of insulin [facility])   [facility] [place], Marzella Schlein, MD

## 2022-09-26 ENCOUNTER — Other Ambulatory Visit: Payer: Self-pay | Admitting: Family Medicine

## 2022-09-27 ENCOUNTER — Telehealth: Payer: Self-pay | Admitting: Internal Medicine

## 2022-09-27 ENCOUNTER — Ambulatory Visit: Payer: Medicaid Other | Admitting: Family Medicine

## 2022-09-27 DIAGNOSIS — J4550 Severe persistent asthma, uncomplicated: Secondary | ICD-10-CM

## 2022-09-27 MED ORDER — BUDESONIDE 0.5 MG/2ML IN SUSP
0.5000 mg | Freq: Two times a day (BID) | RESPIRATORY_TRACT | 2 refills | Status: DC
Start: 2022-09-27 — End: 2022-10-13

## 2022-09-27 NOTE — Telephone Encounter (Signed)
States pharmacy sent fax request for medicine. Is asking for 3 month supply

## 2022-09-27 NOTE — Telephone Encounter (Signed)
90 day supply of [facility]micort [facility] per [name](DPR) request.  Whitney Fleming is aware and voiced her understanding.  Nothing further needed.

## 2022-09-28 ENCOUNTER — Telehealth: Payer: Self-pay

## 2022-09-28 ENCOUNTER — Telehealth: Payer: Self-pay | Admitting: Family Medicine

## 2022-09-28 ENCOUNTER — Other Ambulatory Visit: Payer: Self-pay

## 2022-09-28 DIAGNOSIS — E1165 Type 2 diabetes mellitus with hyperglycemia: Secondary | ICD-10-CM

## 2022-09-28 DIAGNOSIS — E1159 Type 2 diabetes mellitus with other circulatory complications: Secondary | ICD-10-CM

## 2022-09-28 MED ORDER — ACCU-CHEK GUIDE VI STRP
ORAL_STRIP | 3 refills | Status: DC
Start: 2022-09-28 — End: 2023-04-18

## 2022-09-28 MED ORDER — ACCU-CHEK FASTCLIX LANCETS MISC
1 refills | Status: DC
Start: 2022-09-28 — End: 2024-03-26

## 2022-09-28 NOTE — Telephone Encounter (Signed)
Copied from CRM [id number]. Topic: General - Other >> Sep 28, 2022 11:02 AM Macon Large wrote: Reason for CRM: Melissa with [facility] reports that they need a new Rx for glucose blood (ACCU-CHEK GUIDE) test strip along with the diagnosis code. Melissa also stated that they will be sending paperwork for the frequency of testing that has to be signed and faxed back to the fax# listed on the paperwork.

## 2022-09-28 NOTE — Telephone Encounter (Signed)
Rx resent with diagnosis code.

## 2022-09-28 NOTE — Telephone Encounter (Signed)
[facility] requesting prescription refill Accu-Chek FastClix Lancets MISC   Please advise

## 2022-10-01 ENCOUNTER — Telehealth: Payer: Self-pay

## 2022-10-01 NOTE — Progress Notes (Signed)
   Care Guide Note  10/01/2022 Name: Whitney Fleming MRN: [id number] DOB: 1945-06-23  Referred by: Erasmo Downer, MD Reason for referral : Care Coordination (Outreach to schedule with Pharm d )   Whitney Fleming is a 77 y.o. year old female who is a primary care patient of Bacigalupo, Marzella Schlein, MD. [name] was referred to the pharmacist for assistance related to DM.    Successful contact was made with the patient to discuss pharmacy services including being ready for the pharmacist to call at least 5 minutes before the scheduled appointment time, to have medication bottles and any blood sugar or blood pressure readings ready for review. The patient agreed to meet with the pharmacist via with the pharmacist via telephone visit on (date/time).  10/29/2022  [name], RMA Care Guide [facility]  [place], [place] [address] Direct Dial: [phone] [name]

## 2022-10-01 NOTE — Telephone Encounter (Signed)
Received PA request from [facility] for Pulmicort.  PA team, please advise. Thanks

## 2022-10-03 ENCOUNTER — Other Ambulatory Visit (HOSPITAL_COMMUNITY): Payer: Self-pay

## 2022-10-03 NOTE — Telephone Encounter (Signed)
Test claim shows that this needs to be processed with patient's Medicare Part B. Unfortunately, the PA team cannot process these claims so I am unable to give a co-pay

## 2022-10-04 NOTE — Telephone Encounter (Signed)
[facility] pharmacy to make them aware. Pharmacy is current closed.  During call back during business hours.

## 2022-10-05 NOTE — Telephone Encounter (Signed)
[facility]- on hold for >62min then call was disconnected.   Spoke to patient's daughter, Whitney Fleming(DPR) and verified that pulmicort Rx had not been filled.   [facility] pharmacy back and again no answer-- on hold for >14min then call was disconnected.   Will call back.

## 2022-10-11 NOTE — Telephone Encounter (Signed)
ATC [facility] pharmacy in [place] on [address].  The line just kept ringing.  No answer.

## 2022-10-12 ENCOUNTER — Other Ambulatory Visit: Payer: Self-pay | Admitting: Internal Medicine

## 2022-10-12 DIAGNOSIS — J4550 Severe persistent asthma, uncomplicated: Secondary | ICD-10-CM

## 2022-10-15 NOTE — Telephone Encounter (Signed)
Received a fax from the patient's pharmacy for Medicare billing. I have placed it in your folder to be signed.

## 2022-10-16 ENCOUNTER — Other Ambulatory Visit (HOSPITAL_COMMUNITY): Payer: Self-pay

## 2022-10-17 NOTE — Telephone Encounter (Signed)
I have faxed the signed script back to [facility] and notified the patient's daughter(DPR).  Nothing further needed.

## 2022-10-17 NOTE — Telephone Encounter (Signed)
Manisha daughter checking on message for patient's meds. Manisha phone number is [phone].

## 2022-10-19 ENCOUNTER — Other Ambulatory Visit: Payer: Self-pay | Admitting: Family Medicine

## 2022-10-19 DIAGNOSIS — J411 Mucopurulent chronic bronchitis: Secondary | ICD-10-CM

## 2022-10-19 DIAGNOSIS — R0609 Other forms of dyspnea: Secondary | ICD-10-CM

## 2022-10-19 DIAGNOSIS — M7989 Other specified soft tissue disorders: Secondary | ICD-10-CM

## 2022-10-19 DIAGNOSIS — J4551 Severe persistent asthma with (acute) exacerbation: Secondary | ICD-10-CM

## 2022-10-19 DIAGNOSIS — J9601 Acute respiratory failure with hypoxia: Secondary | ICD-10-CM

## 2022-10-22 ENCOUNTER — Telehealth: Payer: Self-pay

## 2022-10-22 NOTE — Telephone Encounter (Signed)
Suli, were you able to send DME Rx last week?

## 2022-10-22 NOTE — Telephone Encounter (Signed)
Copied from CRM [id number]. Topic: General - Other >> Oct 22, 2022  9:28 AM Clide Dales wrote: Tresa Endo with [facility] states that she sent in a request for a shower chair and nebulizer for patient. Tresa Endo states the order was sent back to her but she wanted to confirm that it was also sent to the medical supply place. Please advise.

## 2022-10-22 NOTE — Telephone Encounter (Signed)
Copied from CRM [id number]. Topic: General - Other >> Oct 22, 2022  4:19 PM Jannifer Rodney M wrote: Reason for CRM: Pt daughter reports that the nebulizer machine is broken. She requests that a prescription be sent to [facility] for a new nebulizer machine

## 2022-10-23 NOTE — Telephone Encounter (Signed)
Faxed order with notes to [facility].

## 2022-10-23 NOTE — Telephone Encounter (Signed)
See message yesterday regarding [facility]

## 2022-10-29 ENCOUNTER — Encounter: Payer: Self-pay | Admitting: Pharmacist

## 2022-10-29 ENCOUNTER — Other Ambulatory Visit: Payer: Medicare Other | Admitting: Pharmacist

## 2022-10-29 NOTE — Progress Notes (Unsigned)
10/29/2022 Name: Brishae Lancaster MRN: [id number] DOB: Apr 23, 1946  Chief Complaint  Patient presents with   Medication Management    Vanessia Roszkowski is a 77 y.o. year old female who presented for a telephone visit.  Speak with patient's daughter/caregiver Trevor Mace (listed on DPR in chart).   They were referred to the pharmacist by their PCP for assistance in managing diabetes.   Patient is participating in a Managed Medicaid Plan:  Yes, [employer]  Subjective:  Care Team: Primary Care Provider: Erasmo Downer, MD ; Next Scheduled Visit: 12/31/2022 Pulmonology: Erin Fulling, MD Nephrology: Lorain Childes, MD; Next Scheduled Visit: 10/30/2022  Medication Access/Adherence  Current Pharmacy:  [facility] [address] [place] - [address] [place] [address] Phone: [phone] Fax: [phone]  [facility] DR[facility]ORE #[id number] [place], [place] - [address] AT [facility] OF [address] [place] [address] Phone: [phone] Fax: [phone]   Patient reports affordability concerns with their medications: No  Patient reports access/transportation concerns to their pharmacy: No  Patient reports adherence concerns with their medications:  No     Weekly pillbox; denies missed doses  Diabetes:  Current medications:  - Jardiance 25 mg daily - Metformin 1000 mg twice daily - Januvia 100 mg daily - Lantus 20 units daily  Previous therapies tried: Bydureon (pain with injection), Trulicity (pain with injection), Freestyle Libre 14 CGM  Checks blood sugar twice daily, before breakfast and before supper; reports readings ranging: 205-290s  Patient denies hypoglycemic s/sx including dizziness, shakiness, sweating.   Current meal patterns:  - Breakfast: oatmeal  - Lunch: multi-grain bread and okra or quinoa  - Supper: quinoa and yogurt  - Drinks:tea (Stevia), water and 1 mug milk at bedtime  Current  physical activity: walking around in home throughout the day with walker  Statin: rosuvastatin 5 mg daily    Objective:  Lab Results  Component Value Date   HGBA1C 10.9 (A) 09/17/2022    Lab Results  Component Value Date   CREATININE 1.18 (H) 06/25/2022   BUN 25 06/25/2022   NA 130 (L) 06/25/2022   K 5.9 (H) 06/25/2022   CL 93 (L) 06/25/2022   CO2 18 (L) 06/25/2022    Lab Results  Component Value Date   CHOL 127 12/11/2021   HDL 49 12/11/2021   LDLCALC 48 12/11/2021   TRIG 187 (H) 12/11/2021   CHOLHDL 2.6 12/11/2021   BP Readings from Last 3 Encounters:  09/17/22 114/70  07/24/22 126/60  06/25/22 123/71   Pulse Readings from Last 3 Encounters:  09/17/22 88  07/24/22 96  06/25/22 97     Medications Reviewed Today     Reviewed by Erasmo Downer, MD (Physician) on 09/17/22 at 1405  Med List Status: <None>   Medication Order Taking? Sig Documenting Provider Last Dose Status Informant  Accu-Chek FastClix Lancets MISC [id number] No Use as directed to check blood glucose twice daily Erasmo Downer, MD Taking Active   AMBULATORY [facility] MEDICATION [id number] No Medication Name: Aero chamber use as directed. KG:U54.270 Shane Crutch, MD Taking Active Child  amLODipine (NORVASC) 5 MG tablet [id number]  Take 1 tablet (5 mg total) by mouth daily. Erasmo Downer, MD  Active   arformoterol [facility]) 15 MCG/2ML NEBU [id number] No Take 2 mLs (15 mcg total) by nebulization in the morning and at bedtime. Erin Fulling, MD Taking Active   aspirin EC 81 MG tablet [id number] No  Take 1 tablet (81 mg total) by mouth daily. Swallow whole. Antonieta Iba, MD Taking Active   azithromycin [facility]) 250 MG tablet [id number] Yes Take 500mg  PO daily x1d and then 250mg  daily x4 days Erasmo Downer, MD  Active   benzonatate (TESSALON) 100 MG capsule [id number] No Take 1 capsule (100 mg total) by mouth 2 (two) times daily as needed for cough. Erasmo Downer, MD Taking Active   budesonide (PULMICORT) 0.5 MG/2ML nebulizer solution [id number] No Take 2 mLs (0.5 mg total) by nebulization 2 (two) times daily. Erin Fulling, MD Taking Active   budesonide-formoterol [facility]) 160-4.5 MCG/ACT inhaler [id number]  Inhale 3 puffs into the lungs 2 (two) times daily. Erin Fulling, MD  Active   carvedilol (COREG) 12.5 MG tablet [id number]  Take 1 tablet (12.5 mg total) by mouth 2 (two) times daily with a meal. Bacigalupo, Marzella Schlein, MD  Active   empagliflozin (JARDIANCE) 25 MG TABS tablet [id number]  Take 1 tablet (25 mg total) by mouth daily. Erasmo Downer, MD  Active   glucose blood (ACCU-CHEK GUIDE) test strip [id number] No USE  STRIP TO CHECK GLUCOSE TWICE DAILY AS DIRECTED Bacigalupo, Marzella Schlein, MD Taking Active   insulin glargine (LANTUS SOLOSTAR) 100 UNIT/ML Solostar Pen [id number]  Inject 25 Units into the skin daily. Erasmo Downer, MD  Active   Insulin Pen Needle (NOVOFINE PEN NEEDLE) 32G X 6 MM MISC [id number] No Use as directed to inject insulin Bacigalupo, Marzella Schlein, MD Taking Active   ipratropium-albuterol (DUONEB) 0.5-2.5 (3) MG/3ML SOLN [id number] No Take 3 mLs by nebulization every 6 (six) hours as needed. Willy Eddy, MD Taking Active   metFORMIN (GLUCOPHAGE) 1000 MG tablet [id number]  Take 1 tablet (1,000 mg total) by mouth 2 (two) times daily with a meal. Bacigalupo, Marzella Schlein, MD  Active   montelukast (SINGULAIR) 10 MG tablet [id number]  Take 1 tablet (10 mg total) by mouth at bedtime. Erasmo Downer, MD  Active   predniSONE (DELTASONE) 20 MG tablet [id number]  Take 1 tablet (20 mg total) by mouth daily with breakfast for 5 days. Erasmo Downer, MD  Active   rosuvastatin (CRESTOR) 5 MG tablet [id number]  Take 1 tablet (5 mg total) by mouth daily. Erasmo Downer, MD  Active   sitaGLIPtin (JANUVIA) 100 MG tablet [id number]  Take 1 tablet (100 mg total) by mouth daily. Erasmo Downer, MD  Active                Assessment/Plan:   Daughter plans to contact Pulmonology office today regarding obtaining a new nebulizer machine for patient  Comprehensive medication review performed; medication list updated in electronic medical record - Reports patient not using Symbicort inhaler, rather this was prescribed by Pulmonologist to use during air travel from past trip since she can not use a nebulizer on the plane - Counsel on importance of rinsing and spitting out after each use of budesonide nebulizer solution  Diabetes: - Currently uncontrolled - Reviewed long term cardiovascular and renal outcomes of uncontrolled blood sugar - Reviewed dietary modifications including importance of having regular well-balanced meals, while controlling carbohydrate portion sizes  Encourage to review nutrition labels for carbohydrate content of foods - Send patient/caregiver educational handout on healthy meal planning via MyChart message - Advised patient/caregiver to follow recommendation for Lantus dose as provided by PCP at latest office visit on 09/17/2022  Caregiver/patient confirm will increase patient's Lantus dose to 25 units daily as directed - [employer]  patient on s/s of low blood sugar and how to treat lows Review rules of 15s - importance of using 15 grams of sugar to treat low, recheck blood sugar in 15 minutes, treat again if remains low or, if back to normal, having meal if mealtime or snack  Review examples of sources of 15 grams of sugar Encourage patient/caregiver to obtain glucose tablets - Counsel patient/daughter on benefits of continuous glucose monitoring (CGM) to aid with blood sugar control, while also helping to monitor for low blood sugar  Patient/daughter decline at this time, but will discuss again during future appointment - Recommend to continue to check glucose, keep log of results and have this record to review during future appointments    Follow Up Plan: Clinical Pharmacist will  follow up with patient/daughter again on 11/26/2022 at 2 pm  Estelle Grumbles, PharmD, [facility] [phone]

## 2022-10-30 ENCOUNTER — Telehealth: Payer: Self-pay | Admitting: Internal Medicine

## 2022-10-30 DIAGNOSIS — J4550 Severe persistent asthma, uncomplicated: Secondary | ICD-10-CM

## 2022-10-30 NOTE — Telephone Encounter (Signed)
Pt's daughter states nebulizer machine isn't working and they need a RX for a new one

## 2022-10-30 NOTE — Patient Instructions (Signed)
Goals Addressed             This Visit's Progress    Pharmacy Goals       Our goal A1c is less than 7%. This corresponds with fasting sugars less than 130 and 2 hour after meal sugars less than 180. Please keep a log of your results when checking your blood sugar   Our goal bad cholesterol, or LDL, is less than 70. This is why it is important to continue taking your rosuvastatin.  Please have your medications and blood sugar record with you for our next telephone call on 11/26/2022 at 2 pm   Thank you!  Estelle Grumbles, PharmD, [facility] [phone]

## 2022-10-30 NOTE — Telephone Encounter (Signed)
Spoke to patient's daughter, Manisha(DPR). She is requesting order for new nebulizer machine. Current machine is broken.  Order placed to adapt.  Nothing further needed.

## 2022-11-09 ENCOUNTER — Telehealth: Payer: Self-pay | Admitting: Family Medicine

## 2022-11-09 DIAGNOSIS — E1165 Type 2 diabetes mellitus with hyperglycemia: Secondary | ICD-10-CM

## 2022-11-09 MED ORDER — LANTUS SOLOSTAR 100 UNIT/ML ~~LOC~~ SOPN
25.0000 [IU] | PEN_INJECTOR | Freq: Every day | SUBCUTANEOUS | 5 refills | Status: DC
Start: 2022-11-09 — End: 2022-11-27

## 2022-11-09 NOTE — Telephone Encounter (Signed)
[facility] pharmacy is requesting prescription refill insulin glargine (LANTUS SOLOSTAR) 100 UNIT/ML Solostar Pen  Please advise

## 2022-11-20 ENCOUNTER — Telehealth: Payer: Self-pay | Admitting: Internal Medicine

## 2022-11-20 NOTE — Telephone Encounter (Signed)
Ipratopium Bromide albuterol sulfate inhalation solution. 90 days supply . [facility]

## 2022-11-21 MED ORDER — IPRATROPIUM-ALBUTEROL 0.5-2.5 (3) MG/3ML IN SOLN: 3.0000 mL | Freq: Four times a day (QID) | RESPIRATORY_TRACT | 3 refills | Status: DC | PRN

## 2022-11-21 NOTE — Telephone Encounter (Signed)
I have sent in the prescription and notified the patient's daughter.  Nothing further needed. 

## 2022-11-22 ENCOUNTER — Telehealth: Payer: Self-pay

## 2022-11-22 ENCOUNTER — Other Ambulatory Visit (HOSPITAL_COMMUNITY): Payer: Self-pay

## 2022-11-22 NOTE — Telephone Encounter (Signed)
Patient Advocate Encounter   Received notification from [facility] that prior authorization is required for Ipratropium-Albuterol 0.5-2.5 (3)MG/3ML solution   Submitted: n/a [place] [id number]  PA not submitted at this time. Per Medicare, this must be processed through PART B

## 2022-11-22 NOTE — Telephone Encounter (Signed)
I spoke with the pharmacy and they said the prescription was paid for under Medicare and ready for the patient to pick up.  Nothing further needed.

## 2022-11-26 ENCOUNTER — Telehealth: Payer: Self-pay | Admitting: Pharmacist

## 2022-11-26 ENCOUNTER — Other Ambulatory Visit: Payer: Medicare Other | Admitting: Pharmacist

## 2022-11-26 ENCOUNTER — Encounter: Payer: Self-pay | Admitting: Pharmacist

## 2022-11-26 NOTE — Progress Notes (Signed)
   Outreach Note  11/26/2022 Name: Altamae Lipka MRN: [id number] DOB: 05-07-1946  Referred by: Erasmo Downer, MD Reason for referral : No chief complaint on file.   Was unable to reach patient via telephone today and unable to leave a message as no voicemail is setup.   Follow Up Plan: Will collaborate with Care Guide to outreach to schedule follow up with me  Estelle Grumbles, PharmD, [facility] Clinical Pharmacist [facility] [phone]

## 2022-11-26 NOTE — Patient Instructions (Signed)
Goals Addressed             This Visit's Progress    Pharmacy Goals       Our goal A1c is less than 7%. This corresponds with fasting sugars less than 130 and 2 hour after meal sugars less than 180. Please keep a log of your results when checking your blood sugar   Our goal bad cholesterol, or LDL, is less than 70. This is why it is important to continue taking your rosuvastatin.  Please have your medications and blood sugar record with you for our next telephone call on 02/04/2023 at 1:45 PM    Thank you!  Estelle Grumbles, PharmD, [facility] [phone]

## 2022-11-26 NOTE — Progress Notes (Signed)
11/26/2022 Name: Whitney Fleming MRN: [id number] DOB: [birth year]  Chief Complaint  Patient presents with   Medication Management   Medication Adherence    Whitney Fleming is a 77 y.o. year old female who presented for a telephone visit.   Speak with patient's daughter/caregiver Whitney Fleming (listed on DPR in chart).   They were referred to the pharmacist by their PCP for assistance in managing diabetes.    Patient is participating in a Managed Medicaid Plan:  Yes, [employer]  Subjective:  Care Team: Primary Care Provider: Erasmo Downer, MD ; Next Scheduled Visit: 12/31/2022 Pulmonology: Erin Fulling, MD; Next Scheduled Visit: 01/24/2023 Nephrology: Lorain Childes, MD; Next Scheduled Visit: 12/11/2022  Medication Access/Adherence  Current Pharmacy:  [facility] [address] [place] - [address] [place] [address] Phone: [phone] Fax: [phone]  [facility] DR[facility] STORE #[id number] [place], [place] - [address] AT [facility] [address] [place] [address] Phone: [phone] Fax: [phone]   Patient reports affordability concerns with their medications: No  Patient reports access/transportation concerns to their pharmacy: No  Patient reports adherence concerns with their medications:  No       Weekly pillbox; denies missed doses   Diabetes:   Current medications:  - Jardiance 25 mg daily - Metformin 1000 mg twice daily - Januvia 100 mg daily - Lantus 25 units daily (Reports increased as directed following our conversation last month)   Previous therapies tried: Bydureon (pain with injection), Trulicity (pain with injection), Freestyle Libre 14 CGM   Checks blood sugar twice daily, reports readings before breakfast mostly ranging: 180-196 and before supper; ranging: 200-230s - Daughter reports patient resistant to blood sugar lowering as is accustom to blood sugar running higher and patient  reports feels better when it is higher   Patient denies hypoglycemic s/sx including dizziness, shakiness, sweating. Reports lowest blood sugar reading observed was ~150 - Reports now has glucose tablets for patient to use if needed for hypoglycemia  Reports discussed/considered retrying continuous glucose monitor (CGM) as discussed last time, but patient not interested   Reports has reviewed dietary education handout on meal planning   Current physical activity: walking around in home throughout the day with walker   Statin: rosuvastatin 5 mg daily   Hypertension:  Current medications: - amlodipine 5 mg daily - carvedilol 12.5 mg twice daily  Reports has a home upper arm blood pressure monitor, but denies monitoring recently   Objective:  Lab Results  Component Value Date   HGBA1C 10.9 (A) 09/17/2022    Lab Results  Component Value Date   CREATININE 1.18 (H) 06/25/2022   BUN 25 06/25/2022   NA 130 (L) 06/25/2022   K 5.9 (H) 06/25/2022   CL 93 (L) 06/25/2022   CO2 18 (L) 06/25/2022    Lab Results  Component Value Date   CHOL 127 12/11/2021   HDL 49 12/11/2021   LDLCALC 48 12/11/2021   TRIG 187 (H) 12/11/2021   CHOLHDL 2.6 12/11/2021   BP Readings from Last 3 Encounters:  09/17/22 114/70  07/24/22 126/60  06/25/22 123/71   Pulse Readings from Last 3 Encounters:  09/17/22 88  07/24/22 96  06/25/22 97     Medications Reviewed Today     Reviewed by Manuela Neptune, RPH-CPP (Pharmacist) on 10/29/22 at 1502  Med List Status: <None>   Medication Order Taking? Sig Documenting Provider Last Dose Status Informant  Accu-Chek FastClix  Lancets MISC [id number]  Use as directed to check blood glucose twice daily Erasmo Downer, MD  Active   [facility] MEDICATION [id number]  Medication Name: Aero chamber use as directed. XL:K44.010 Shane Crutch, MD  Active Child  amLODipine (NORVASC) 5 MG tablet [id number] Yes Take 1 tablet (5 mg total)  by mouth daily. Erasmo Downer, MD Taking Active   arformoterol [facility]) 15 MCG/2ML NEBU [id number] Yes Take 2 mLs (15 mcg total) by nebulization in the morning and at bedtime. Erin Fulling, MD Taking Active   aspirin EC 81 MG tablet [id number] Yes Take 1 tablet (81 mg total) by mouth daily. Swallow whole. Antonieta Iba, MD Taking Active   budesonide (PULMICORT) 0.5 MG/2ML nebulizer solution [id number] Yes USE 1 VIAL VIA NEBULIZER TWICE DAILY Erin Fulling, MD Taking Active   budesonide-formoterol [facility]) 160-4.5 MCG/ACT inhaler [id number] No Inhale 3 puffs into the lungs 2 (two) times daily.  Patient not taking: Reported on 10/29/2022   Erin Fulling, MD Not Taking Active   carvedilol (COREG) 12.5 MG tablet [id number] Yes Take 1 tablet (12.5 mg total) by mouth 2 (two) times daily with a meal. Bacigalupo, Marzella Schlein, MD Taking Active   empagliflozin (JARDIANCE) 25 MG TABS tablet [id number] Yes Take 1 tablet (25 mg total) by mouth daily. Erasmo Downer, MD Taking Active   glucose blood (ACCU-CHEK GUIDE) test strip [id number]  USE  STRIP TO CHECK GLUCOSE TWICE DAILY AS DIRECTEDUSE  STRIP TO CHECK GLUCOSE TWICE DAILY AS DIRECTED Beryle Flock, Marzella Schlein, MD  Active   insulin glargine (LANTUS SOLOSTAR) 100 UNIT/ML Solostar Pen [id number]  Inject 25 Units into the skin daily. Erasmo Downer, MD  Active   Insulin Pen Needle (NOVOFINE PEN NEEDLE) 32G X 6 MM MISC [id number]  Use as directed to inject insulin Bacigalupo, Marzella Schlein, MD  Active   ipratropium-albuterol (DUONEB) 0.5-2.5 (3) MG/3ML SOLN [id number] Yes Take 3 mLs by nebulization every 6 (six) hours as needed. Willy Eddy, MD Taking Active   metFORMIN (GLUCOPHAGE) 1000 MG tablet [id number] Yes Take 1 tablet (1,000 mg total) by mouth 2 (two) times daily with a meal. Bacigalupo, Marzella Schlein, MD Taking Active   montelukast (SINGULAIR) 10 MG tablet [id number] Yes Take 1 tablet (10 mg total) by mouth at bedtime. Erasmo Downer, MD Taking  Active   rosuvastatin (CRESTOR) 5 MG tablet [id number] Yes Take 1 tablet (5 mg total) by mouth daily. Erasmo Downer, MD Taking Active   sitaGLIPtin (JANUVIA) 100 MG tablet [id number] Yes Take 1 tablet (100 mg total) by mouth daily. Erasmo Downer, MD Taking Active               Assessment/Plan:   Diabetes: - Currently uncontrolled - Reviewed long term cardiovascular and renal outcomes of uncontrolled blood sugar - Counsel on benefits of working to improve and then maintain blood sugar control - Reviewed dietary modifications including importance of having regular well-balanced meals, while controlling carbohydrate portion sizes             Encourage to review nutrition labels for carbohydrate content of foods - Community education officer on benefits of continuous glucose monitoring (CGM) to aid with blood sugar control, while also helping to monitor for low blood sugar             Patient/daughter decline - Recommend to continue to check glucose, keep log of results and have this record to review during future appointments   Hypertension: - Recommended to check home blood  pressure and heart rate, keep log of results and have this record to review during future appointments. Patient/caregiver to call office sooner if needed for readings outside of established parameters or symptoms   Follow Up Plan: Clinical Pharmacist will follow up with patient/daughter again on 02/04/2023 at 1:45 pm   Estelle Grumbles, PharmD, [facility] [phone]

## 2022-11-27 ENCOUNTER — Telehealth: Payer: Self-pay | Admitting: Family Medicine

## 2022-11-27 ENCOUNTER — Telehealth: Payer: Self-pay

## 2022-11-27 DIAGNOSIS — E1165 Type 2 diabetes mellitus with hyperglycemia: Secondary | ICD-10-CM

## 2022-11-27 MED ORDER — LANTUS SOLOSTAR 100 UNIT/ML ~~LOC~~ SOPN
25.0000 [IU] | PEN_INJECTOR | Freq: Every day | SUBCUTANEOUS | 5 refills | Status: DC
Start: 2022-11-27 — End: 2022-12-17

## 2022-11-27 NOTE — Telephone Encounter (Signed)
[facility] pharmacy faxed refill request for the following medications:   insulin glargine (LANTUS SOLOSTAR) 100 UNIT/ML Solostar Pen    Please advise

## 2022-11-27 NOTE — Telephone Encounter (Signed)
Suli, do you know anything about these papers?   Copied from CRM [id number]. Topic: General - Other >> Nov 27, 2022 11:04 AM Macon Large wrote: Reason for CRM: Tresa Endo with [facility] reports that [facility] forms were faxed and to please complete the forms if provider feels patient needs these services. Per Tresa Endo the pt daughter is requesting these services. Cb# [phone]

## 2022-11-29 NOTE — Telephone Encounter (Signed)
Left message on vm for kelly advising that Dr. B is out off the office until Monday. And that we will fax forms back after she reviews and fills them out.

## 2022-12-07 DIAGNOSIS — Z0279 Encounter for issue of other medical certificate: Secondary | ICD-10-CM

## 2022-12-12 ENCOUNTER — Other Ambulatory Visit: Payer: Self-pay | Admitting: Family Medicine

## 2022-12-17 ENCOUNTER — Other Ambulatory Visit: Payer: Self-pay

## 2022-12-17 MED ORDER — BASAGLAR KWIKPEN 100 UNIT/ML ~~LOC~~ SOPN: 25.0000 [IU] | PEN_INJECTOR | Freq: Every day | SUBCUTANEOUS | 3 refills | Status: DC

## 2022-12-31 ENCOUNTER — Ambulatory Visit (INDEPENDENT_AMBULATORY_CARE_PROVIDER_SITE_OTHER): Payer: Medicare Other | Admitting: Family Medicine

## 2022-12-31 ENCOUNTER — Encounter: Payer: Self-pay | Admitting: Family Medicine

## 2022-12-31 VITALS — BP 130/52 | HR 85 | Resp 14 | Ht 61.0 in | Wt 146.8 lb

## 2022-12-31 DIAGNOSIS — E1169 Type 2 diabetes mellitus with other specified complication: Secondary | ICD-10-CM | POA: Diagnosis not present

## 2022-12-31 DIAGNOSIS — Z0000 Encounter for general adult medical examination without abnormal findings: Secondary | ICD-10-CM | POA: Diagnosis not present

## 2022-12-31 DIAGNOSIS — K219 Gastro-esophageal reflux disease without esophagitis: Secondary | ICD-10-CM | POA: Diagnosis not present

## 2022-12-31 DIAGNOSIS — I152 Hypertension secondary to endocrine disorders: Secondary | ICD-10-CM

## 2022-12-31 DIAGNOSIS — J411 Mucopurulent chronic bronchitis: Secondary | ICD-10-CM | POA: Diagnosis not present

## 2022-12-31 DIAGNOSIS — N1831 Chronic kidney disease, stage 3a: Secondary | ICD-10-CM | POA: Diagnosis not present

## 2022-12-31 DIAGNOSIS — E785 Hyperlipidemia, unspecified: Secondary | ICD-10-CM

## 2022-12-31 DIAGNOSIS — E1159 Type 2 diabetes mellitus with other circulatory complications: Secondary | ICD-10-CM | POA: Diagnosis not present

## 2022-12-31 MED ORDER — GUAIFENESIN ER 600 MG PO TB12: 600.0000 mg | ORAL_TABLET | Freq: Two times a day (BID) | ORAL | 2 refills | Status: DC | PRN

## 2022-12-31 MED ORDER — FAMOTIDINE 20 MG PO TABS: 20.0000 mg | ORAL_TABLET | Freq: Two times a day (BID) | ORAL | 1 refills | Status: AC

## 2022-12-31 NOTE — Assessment & Plan Note (Signed)
Chronic and stbale F/b Pulm Continue current meds Add mucinex prn for cough/mucus

## 2022-12-31 NOTE — Assessment & Plan Note (Signed)
Previously uncontrolled with hyperglycemia. Stable on current regimen of Basaglar 25 units daily, Jardiance 25mg  daily, Metformin, and Januvia. Awaiting A1C results to determine if insulin adjustment is needed. -Continue current medication regimen. -Order A1C, kidney and liver function, and cholesterol labs. -Adjust insulin dosage as needed based on A1C results.

## 2022-12-31 NOTE — Assessment & Plan Note (Signed)
Pepcid bid prn

## 2022-12-31 NOTE — Assessment & Plan Note (Signed)
Well controlled Continue current medications Recheck metabolic panel F/u in 3 months  

## 2022-12-31 NOTE — Assessment & Plan Note (Signed)
Well-controlled on recent lipid panel Continue statin Repeat lipid panel and CMP

## 2022-12-31 NOTE — Progress Notes (Addendum)
Medicare Initial Preventative Physical Exam    Patient: Whitney Fleming, Female    DOB: 1945-10-29, 77 y.o.   MRN: [id number] Visit Date: 12/31/2022  Today's Provider: Shirlee Latch, MD   Chief Complaint  Patient presents with   Medicare Wellness   Annual Exam   Subjective    Medicare Initial Preventative Physical Exam Whitney Fleming is a 77 y.o. female who presents today for her Initial Preventative Physical Exam.   Declines interpreter - daughter is present  HPI  Discussed the use of AI scribe software for clinical note transcription with the patient, who gave verbal consent to proceed.  History of Present Illness   The patient, with a history of diabetes and kidney disease, presents for a "Welcome to Medicare" visit. The patient reports some difficulty hearing and climbing stairs, but denies any issues with vision, memory, or performing daily activities. The patient's diabetes is managed with insulin, Jardiance, Metformin, and Januvia. The patient's kidney function is being monitored, with a recent measure of 49. The patient also reports some difficulty with language, but is able to communicate effectively with the assistance of a family member.   Wants GERD Medication - pepcid and cough medication for COPD       Social History   Socioeconomic History   Marital status: Married    Spouse name: Not on file   Number of children: 4   Years of education: Not on file   Highest education level: Not on file  Occupational History   Occupation: homemaker  Tobacco Use   Smoking status: Never   Smokeless tobacco: Never  Vaping Use   Vaping status: Never Used  Substance and Sexual Activity   Alcohol use: Never   Drug use: Never   Sexual activity: Not Currently  Other Topics Concern   Not on file  Social History Narrative   Not on file   Social Determinants of Health   Financial Resource Strain: Not on file  Food Insecurity: Not on file  Transportation Needs: Not  on file  Physical Activity: Not on file  Stress: Not on file  Social Connections: Not on file  Intimate Partner Violence: Not on file    Past Medical History:  Diagnosis Date   Anemia    normocytic anemia   Asthma    Cardiomegaly    COPD (chronic obstructive pulmonary disease) (HCC)    Diabetes mellitus without complication (HCC)    Hypertension    Hyponatremia      Patient Active Problem List   Diagnosis Date Noted   Leg swelling 06/25/2022   Obesity 08/10/2021   Hearing difficulty of both ears 04/27/2021   Hyperkalemia 09/24/2020   Dyspnea on exertion 11/18/2019   COPD (chronic obstructive pulmonary disease) (HCC) 03/03/2018   Hyperlipidemia associated with type 2 diabetes mellitus (HCC) 12/20/2017   GERD (gastroesophageal reflux disease) 12/20/2017   Respiratory failure (HCC) 12/02/2017   Asthma 09/26/2017   Hypertension associated with diabetes (HCC) 09/26/2017   Diabetes (HCC) 09/26/2017    Past Surgical History:  Procedure Laterality Date   TUBAL LIGATION      Her family history includes Asthma in her father; Healthy in her mother; Heart disease in her father; Tuberculosis in an other family member. There is no history of Cancer.   Current Outpatient Medications:    Accu-Chek FastClix Lancets MISC, Use as directed to check blood glucose twice daily, Disp: 100 each, Rfl: 1   AMBULATORY NON FORMULARY MEDICATION, Medication Name: Aero chamber use  as directed. DX:J45.909, Disp: 1 each, Rfl: 0   amLODipine (NORVASC) 5 MG tablet, Take 1 tablet (5 mg total) by mouth daily., Disp: 90 tablet, Rfl: 1   arformoterol (BROVANA) 15 MCG/2ML NEBU, Take 2 mLs (15 mcg total) by nebulization in the morning and at bedtime., Disp: 360 mL, Rfl: 2   aspirin EC 81 MG tablet, Take 1 tablet (81 mg total) by mouth daily. Swallow whole., Disp: 90 tablet, Rfl: 3   budesonide (PULMICORT) 0.5 MG/2ML nebulizer solution, USE 1 VIAL VIA NEBULIZER TWICE DAILY, Disp: 240 mL, Rfl: 10    budesonide-formoterol (SYMBICORT) 160-4.5 MCG/ACT inhaler, Inhale 3 puffs into the lungs 2 (two) times daily., Disp: 30.6 g, Rfl: 0   carvedilol (COREG) 12.5 MG tablet, Take 1 tablet (12.5 mg total) by mouth 2 (two) times daily with a meal., Disp: 180 tablet, Rfl: 1   empagliflozin (JARDIANCE) 25 MG TABS tablet, Take 1 tablet (25 mg total) by mouth daily., Disp: 90 tablet, Rfl: 1   famotidine (PEPCID) 20 MG tablet, Take 1 tablet (20 mg total) by mouth 2 (two) times daily., Disp: 180 tablet, Rfl: 1   glucose blood (ACCU-CHEK GUIDE) test strip, USE  STRIP TO CHECK GLUCOSE TWICE DAILY AS DIRECTEDUSE  STRIP TO CHECK GLUCOSE TWICE DAILY AS DIRECTED, Disp: 200 each, Rfl: 3   guaiFENesin (MUCINEX) 600 MG 12 hr tablet, Take 1 tablet (600 mg total) by mouth 2 (two) times daily as needed., Disp: 60 tablet, Rfl: 2   Insulin Glargine (BASAGLAR KWIKPEN) 100 UNIT/ML, Inject 25 Units into the skin daily., Disp: 15 mL, Rfl: 3   Insulin Pen Needle (NOVOFINE PEN NEEDLE) 32G X 6 MM MISC, Use as directed to inject insulin, Disp: 100 each, Rfl: 1   ipratropium-albuterol (DUONEB) 0.5-2.5 (3) MG/3ML SOLN, Take 3 mLs by nebulization every 6 (six) hours as needed., Disp: 360 mL, Rfl: 3   metFORMIN (GLUCOPHAGE) 1000 MG tablet, Take 1 tablet (1,000 mg total) by mouth 2 (two) times daily with a meal., Disp: 180 tablet, Rfl: 1   montelukast (SINGULAIR) 10 MG tablet, Take 1 tablet (10 mg total) by mouth at bedtime., Disp: 90 tablet, Rfl: 3   rosuvastatin (CRESTOR) 5 MG tablet, TAKE 1 TABLET(5 MG) BY MOUTH DAILY, Disp: 90 tablet, Rfl: 2   sitaGLIPtin (JANUVIA) 100 MG tablet, Take 1 tablet (100 mg total) by mouth daily., Disp: 90 tablet, Rfl: 1   Patient Care Team: Erasmo Downer, MD as PCP - General (Family Medicine) Ronney Asters, Jackelyn Poling, RPH-CPP as Pharmacist  Review of Systems per HPI      Objective    Vitals: BP (!) 130/52 (BP Location: Left Arm, Patient Position: Sitting, Cuff Size: Normal)   Pulse 85   Resp 14    Ht 5\' 1"  (1.549 m)   Wt 146 lb 12.8 oz (66.6 kg)   SpO2 100%   BMI 27.74 kg/m  No results found.   Physical Exam Vitals reviewed.  Constitutional:      General: She is not in acute distress.    Appearance: Normal appearance. She is well-developed. She is not diaphoretic.  HENT:     Head: Normocephalic and atraumatic.  Eyes:     General: No scleral icterus.    Conjunctiva/sclera: Conjunctivae normal.  Neck:     Thyroid: No thyromegaly.  Cardiovascular:     Rate and Rhythm: Normal rate and regular rhythm.     Heart sounds: Normal heart sounds. No murmur heard. Pulmonary:     Effort: Pulmonary  effort is normal. No respiratory distress.     Breath sounds: Normal breath sounds. No wheezing, rhonchi or rales.  Musculoskeletal:     Cervical back: Neck supple.     Right lower leg: No edema.     Left lower leg: No edema.  Lymphadenopathy:     Cervical: No cervical adenopathy.  Skin:    General: Skin is warm and dry.     Findings: No rash.  Neurological:     Mental Status: She is alert and oriented to person, place, and time. Mental status is at baseline.  Psychiatric:        Mood and Affect: Mood normal.        Behavior: Behavior normal.    Diabetic Foot Exam - Simple   Simple Foot Form Diabetic Foot exam was performed with the following findings: Yes 09/17/2022 12:54 PM  Visual Inspection No deformities, no ulcerations, no other skin breakdown bilaterally: Yes Sensation Testing Intact to touch and monofilament testing bilaterally: Yes Pulse Check Posterior Tibialis and Dorsalis pulse intact bilaterally: Yes Comments      Activities of Daily Living    12/31/2022    3:49 PM 09/17/2022    1:44 PM  In your present state of health, do you have any difficulty performing the following activities:  Hearing? 1 1  Vision? 0 0  Difficulty concentrating or making decisions? 0 0  Walking or climbing stairs? 1 0  Dressing or bathing? 0 0  Doing errands, shopping? 0 1     Fall Risk Assessment    09/17/2022    1:44 PM 06/25/2022    3:34 PM 04/27/2021    3:35 PM 08/02/2020    4:13 PM 04/28/2020    2:17 PM  Fall Risk   Falls in the past year? 0 0 0 0 0  Number falls in past yr: 0 0 0 0 0  Injury with Fall? 0 0 0 0 0  Risk for fall due to : No Fall Risks No Fall Risks   No Fall Risks  Follow up Falls evaluation completed Falls evaluation completed Falls evaluation completed  Falls evaluation completed     Depression Screen    12/31/2022    3:12 PM 09/17/2022    1:44 PM 06/25/2022    3:34 PM 04/27/2021    3:35 PM  PHQ 2/9 Scores  PHQ - 2 Score 0 0 0 0  PHQ- 9 Score  0 0 1   EKG: NSR       No data to display          No results found for any visits on 12/31/22.  Assessment & Plan      Initial Preventative Physical Exam  Reviewed patient's Family Medical History Reviewed and updated list of patient's medical providers Assessment of cognitive impairment was done Assessed patient's functional ability Established a written schedule for health screening services Health Risk Assessent Completed and Reviewed  Exercise Activities and Dietary recommendations  Goals      Pharmacy Goals     Our goal A1c is less than 7%. This corresponds with fasting sugars less than 130 and 2 hour after meal sugars less than 180. Please keep a log of your results when checking your blood sugar   Our goal bad cholesterol, or LDL, is less than 70. This is why it is important to continue taking your rosuvastatin.  Please have your medications and blood sugar record with you for our next telephone call on 02/04/2023 at  1:45 PM    Thank you!  Estelle Grumbles, PharmD, [facility] [phone]         Immunization History  Administered Date(s) Administered   Fluad Quad(high Dose 65+) 04/27/2021, 05/11/2022   Influenza, High Dose Seasonal PF 02/25/2018   PFIZER(Purple Top)SARS-COV-2 Vaccination 07/17/2019, 08/06/2019, 06/01/2020    Pneumococcal Conjugate-13 04/28/2020   Pneumococcal Polysaccharide-23 01/14/2018   Tdap 12/11/2021   Zoster Recombinant(Shingrix) 12/11/2021    Health Maintenance  Topic Date Due   DEXA SCAN  Never done   Zoster Vaccines- Shingrix (2 of 2) 02/05/2022   COVID-19 Vaccine (4 - 2023-24 season) 02/09/2022   OPHTHALMOLOGY EXAM  11/16/2022   INFLUENZA VACCINE  01/10/2023   HEMOGLOBIN A1C  03/19/2023   Diabetic kidney evaluation - eGFR measurement  06/26/2023   Diabetic kidney evaluation - Urine ACR  09/17/2023   FOOT EXAM  09/17/2023   Medicare Annual Wellness (AWV)  12/31/2023   DTaP/Tdap/Td (2 - Td or Tdap) 12/12/2031   Pneumonia Vaccine 50+ Years old  Completed   Hepatitis C Screening  Completed   HPV VACCINES  Aged Out     Discussed health benefits of physical activity, and encouraged her to engage in regular exercise appropriate for her age and condition.   Problem List Items Addressed This Visit       Cardiovascular and Mediastinum   Hypertension associated with diabetes (HCC)    Well controlled Continue current medications Recheck metabolic panel F/u in 3 months       Relevant Orders   Comprehensive metabolic panel     Respiratory   COPD (chronic obstructive pulmonary disease) (HCC)    Chronic and stbale F/b Pulm Continue current meds Add mucinex prn for cough/mucus      Relevant Medications   guaiFENesin (MUCINEX) 600 MG 12 hr tablet     Digestive   GERD (gastroesophageal reflux disease)    Pepcid bid prn      Relevant Medications   famotidine (PEPCID) 20 MG tablet     Endocrine   Diabetes (HCC)    Previously uncontrolled with hyperglycemia. Stable on current regimen of Basaglar 25 units daily, Jardiance 25mg  daily, Metformin, and Januvia. Awaiting A1C results to determine if insulin adjustment is needed. -Continue current medication regimen. -Order A1C, kidney and liver function, and cholesterol labs. -Adjust insulin dosage as needed based on A1C  results.      Relevant Orders   Comprehensive metabolic panel   Lipid panel   Hemoglobin A1c   Hyperlipidemia associated with type 2 diabetes mellitus (HCC)    Well-controlled on recent lipid panel Continue statin Repeat lipid panel and CMP      Relevant Orders   Comprehensive metabolic panel   Lipid panel   Other Visit Diagnoses     Welcome to Medicare preventive visit    -  Primary   Relevant Orders   EKG 12-Lead   Stage 3a chronic kidney disease (HCC)       Relevant Orders   Comprehensive metabolic panel   Urine Microalbumin w/creat. ratio   Parathyroid hormone, intact (no Ca)   Urinalysis   CBC           Diabetes Mellitus: Rx for Dm shoes signed  Hearing Impairment: Reports some difficulty hearing. -Consider referral to audiology for further evaluation.  General Health Maintenance: -Perform EKG as part of Welcome to Medicare visit. -Recommend eye exam as it has been over a year since the last one. -Administer flu shot and  COVID booster when available in the fall. -Order labs as requested by nephrologist, including urine test.        Return in about 3 months (around 04/02/2023) for chronic disease f/u.      Shirlee Latch, MD  [facility] [phone] (phone) [phone] (fax)  [facility]

## 2023-01-01 LAB — URINALYSIS
Bilirubin, UA: NEGATIVE
Ketones, UA: NEGATIVE
Leukocytes,UA: NEGATIVE
Nitrite, UA: NEGATIVE
Protein,UA: NEGATIVE
RBC, UA: NEGATIVE
Specific Gravity, UA: 1.017 (ref 1.005–1.030)
Urobilinogen, Ur: 0.2 mg/dL (ref 0.2–1.0)
pH, UA: 6.5 (ref 5.0–7.5)

## 2023-01-01 LAB — HEMOGLOBIN A1C
Est. average glucose Bld gHb Est-mCnc: 226 mg/dL
Hgb A1c MFr Bld: 9.5 % — ABNORMAL HIGH (ref 4.8–5.6)

## 2023-01-01 LAB — LIPID PANEL
Chol/HDL Ratio: 2.6 ratio (ref 0.0–4.4)
Cholesterol, Total: 135 mg/dL (ref 100–199)
HDL: 51 mg/dL (ref >39)
LDL Chol Calc (NIH): 53 mg/dL (ref 0–99)
Triglycerides: 187 mg/dL — ABNORMAL HIGH (ref 0–149)
VLDL Cholesterol Cal: 31 mg/dL (ref 5–40)

## 2023-01-01 LAB — COMPREHENSIVE METABOLIC PANEL
ALT: 9 IU/L (ref 0–32)
AST: 11 IU/L (ref 0–40)
Albumin: 4.3 g/dL (ref 3.8–4.8)
Alkaline Phosphatase: 84 IU/L (ref 44–121)
BUN/Creatinine Ratio: 17 (ref 12–28)
BUN: 18 mg/dL (ref 8–27)
Bilirubin Total: 0.4 mg/dL (ref 0.0–1.2)
CO2: 22 mmol/L (ref 20–29)
Calcium: 9.6 mg/dL (ref 8.7–10.3)
Chloride: 93 mmol/L — ABNORMAL LOW (ref 96–106)
Creatinine, Ser: 1.09 mg/dL — ABNORMAL HIGH (ref 0.57–1.00)
Globulin, Total: 3.5 g/dL (ref 1.5–4.5)
Glucose: 230 mg/dL — ABNORMAL HIGH (ref 70–99)
Potassium: 5.4 mmol/L — ABNORMAL HIGH (ref 3.5–5.2)
Sodium: 130 mmol/L — ABNORMAL LOW (ref 134–144)
Total Protein: 7.8 g/dL (ref 6.0–8.5)
eGFR: 52 mL/min/{1.73_m2} — ABNORMAL LOW (ref >59)

## 2023-01-01 LAB — CBC
Hematocrit: 37.4 % (ref 34.0–46.6)
Hemoglobin: 10.4 g/dL — ABNORMAL LOW (ref 11.1–15.9)
MCH: 18.9 pg — ABNORMAL LOW (ref 26.6–33.0)
MCHC: 27.8 g/dL — ABNORMAL LOW (ref 31.5–35.7)
MCV: 68 fL — ABNORMAL LOW (ref 79–97)
Platelets: 353 10*3/uL (ref 150–450)
RBC: 5.5 x10E6/uL — ABNORMAL HIGH (ref 3.77–5.28)
RDW: 18.8 % — ABNORMAL HIGH (ref 11.7–15.4)
WBC: 12.5 10*3/uL — ABNORMAL HIGH (ref 3.4–10.8)

## 2023-01-01 LAB — MICROALBUMIN / CREATININE URINE RATIO
Creatinine, Urine: 25.8 mg/dL
Microalb/Creat Ratio: 202 mg/g creat — ABNORMAL HIGH (ref 0–29)
Microalbumin, Urine: 52.2 ug/mL

## 2023-01-01 LAB — PARATHYROID HORMONE, INTACT (NO CA): PTH: 43 pg/mL (ref 15–65)

## 2023-01-07 ENCOUNTER — Telehealth: Payer: Self-pay

## 2023-01-07 ENCOUNTER — Encounter: Payer: Self-pay | Admitting: Primary Care

## 2023-01-07 ENCOUNTER — Ambulatory Visit: Payer: Medicare Other | Admitting: Primary Care

## 2023-01-07 ENCOUNTER — Other Ambulatory Visit: Payer: Self-pay | Admitting: Family Medicine

## 2023-01-07 ENCOUNTER — Other Ambulatory Visit: Payer: Self-pay | Admitting: Primary Care

## 2023-01-07 VITALS — BP 122/70 | HR 87 | Temp 97.9°F | Ht 61.0 in | Wt 148.0 lb

## 2023-01-07 DIAGNOSIS — J4550 Severe persistent asthma, uncomplicated: Secondary | ICD-10-CM

## 2023-01-07 DIAGNOSIS — R5381 Other malaise: Secondary | ICD-10-CM | POA: Diagnosis not present

## 2023-01-07 MED ORDER — AZITHROMYCIN 250 MG PO TABS: ORAL_TABLET | ORAL | 0 refills | Status: DC

## 2023-01-07 MED ORDER — BUDESONIDE 0.5 MG/2ML IN SUSP
0.5000 mg | Freq: Two times a day (BID) | RESPIRATORY_TRACT | 10 refills | Status: DC
Start: 2023-01-07 — End: 2023-01-09

## 2023-01-07 MED ORDER — IPRATROPIUM-ALBUTEROL 0.5-2.5 (3) MG/3ML IN SOLN: 3.0000 mL | Freq: Four times a day (QID) | RESPIRATORY_TRACT | 3 refills | Status: DC | PRN

## 2023-01-07 MED ORDER — ARFORMOTEROL TARTRATE 15 MCG/2ML IN NEBU
15.0000 ug | INHALATION_SOLUTION | Freq: Two times a day (BID) | RESPIRATORY_TRACT | 2 refills | Status: DC
Start: 2023-01-07 — End: 2023-01-09

## 2023-01-07 NOTE — Addendum Note (Signed)
Addended by: Lajoyce Lauber A on: 01/07/2023 02:45 PM   Modules accepted: Orders

## 2023-01-07 NOTE — Progress Notes (Signed)
@Patient  ID: Whitney Fleming, female    DOB: 1945-11-03, 77 y.o.   MRN: [id number]  Chief Complaint  Patient presents with   Follow-up    Breathing has improved. No current sx.     Referring provider: Erasmo Downer, MD  HPI: 77 year old female, never smoked. PMH significant HTN, asthma, respiratory failure, GERD, type 2 diabetes, hyperlipidemia. Patient of Dr. Belia Heman, last seen on 07/24/22.  01/07/2023 Patient presents today for regular follow-up. Asthma symptoms are well controlled on nebulized regimen. She has not been sick recently or had any recent flare ups. She is maintained on Pulmicort/Brovana, Duneb three times a day and Singulair. She uses Symbicort if she travels in place of nebulized regimen. Traveling to [place] next week. She was on azithromycin MWF in the past but no longer daily. She is asking to have something to take with her to have on hand in case she develop bronchitis symptoms. She would like to look into getting travel nebulizer machine. She is walking on her own and strength is better per her daughter. She would like to get a cane to help with long distance walking. She gets tired easily with long distances and they typically will use a wheelchair. No falls.    No Known Allergies  Immunization History  Administered Date(s) Administered   Fluad Quad(high Dose 65+) 04/27/2021, 05/11/2022   Influenza, High Dose Seasonal PF 02/25/2018   PFIZER(Purple Top)SARS-COV-2 Vaccination 07/17/2019, 08/06/2019, 06/01/2020   Pneumococcal Conjugate-13 04/28/2020   Pneumococcal Polysaccharide-23 01/14/2018   Tdap 12/11/2021   Zoster Recombinant(Shingrix) 12/11/2021    Past Medical History:  Diagnosis Date   Anemia    normocytic anemia   Asthma    Cardiomegaly    COPD (chronic obstructive pulmonary disease) (HCC)    Diabetes mellitus without complication (HCC)    Hypertension    Hyponatremia     Tobacco History: Social History   Tobacco Use  Smoking Status Never   Smokeless Tobacco Never   Counseling given: Not Answered   Outpatient Medications Prior to Visit  Medication Sig Dispense Refill   Accu-Chek FastClix Lancets MISC Use as directed to check blood glucose twice daily 100 each 1   AMBULATORY NON FORMULARY MEDICATION Medication Name: Aero chamber use as directed. DX:J45.909 1 each 0   amLODipine (NORVASC) 5 MG tablet Take 1 tablet (5 mg total) by mouth daily. 90 tablet 1   aspirin EC 81 MG tablet Take 1 tablet (81 mg total) by mouth daily. Swallow whole. 90 tablet 3   budesonide-formoterol (SYMBICORT) 160-4.5 MCG/ACT inhaler Inhale 3 puffs into the lungs 2 (two) times daily. 30.6 g 0   carvedilol (COREG) 12.5 MG tablet Take 1 tablet (12.5 mg total) by mouth 2 (two) times daily with a meal. 180 tablet 1   empagliflozin (JARDIANCE) 25 MG TABS tablet Take 1 tablet (25 mg total) by mouth daily. 90 tablet 1   famotidine (PEPCID) 20 MG tablet Take 1 tablet (20 mg total) by mouth 2 (two) times daily. 180 tablet 1   glucose blood (ACCU-CHEK GUIDE) test strip USE  STRIP TO CHECK GLUCOSE TWICE DAILY AS DIRECTEDUSE  STRIP TO CHECK GLUCOSE TWICE DAILY AS DIRECTED 200 each 3   guaiFENesin (MUCINEX) 600 MG 12 hr tablet Take 1 tablet (600 mg total) by mouth 2 (two) times daily as needed. 60 tablet 2   Insulin Glargine (BASAGLAR KWIKPEN) 100 UNIT/ML Inject 25 Units into the skin daily. 15 mL 3   Insulin Pen Needle (NOVOFINE PEN  NEEDLE) 32G X 6 MM MISC Use as directed to inject insulin 100 each 1   metFORMIN (GLUCOPHAGE) 1000 MG tablet Take 1 tablet (1,000 mg total) by mouth 2 (two) times daily with a meal. 180 tablet 1   montelukast (SINGULAIR) 10 MG tablet Take 1 tablet (10 mg total) by mouth at bedtime. 90 tablet 3   rosuvastatin (CRESTOR) 5 MG tablet TAKE 1 TABLET(5 MG) BY MOUTH DAILY 90 tablet 2   sitaGLIPtin (JANUVIA) 100 MG tablet Take 1 tablet (100 mg total) by mouth daily. 90 tablet 1   arformoterol (BROVANA) 15 MCG/2ML NEBU Take 2 mLs (15 mcg total) by  nebulization in the morning and at bedtime. 360 mL 2   budesonide (PULMICORT) 0.5 MG/2ML nebulizer solution USE 1 VIAL VIA NEBULIZER TWICE DAILY 240 mL 10   ipratropium-albuterol (DUONEB) 0.5-2.5 (3) MG/3ML SOLN Take 3 mLs by nebulization every 6 (six) hours as needed. 360 mL 3   No facility-administered medications prior to visit.      Review of Systems  Review of Systems  Constitutional: Negative.   HENT: Negative.    Respiratory:  Negative for cough, shortness of breath and wheezing.   Cardiovascular: Negative.    Physical Exam  BP 122/70 (BP Location: Left Arm, Cuff Size: Normal)   Pulse 87   Temp 97.9 F (36.6 C) (Temporal)   Ht 5\' 1"  (1.549 m)   Wt 148 lb (67.1 kg)   SpO2 96%   BMI 27.96 kg/m  Physical Exam Constitutional:      Appearance: Normal appearance.  HENT:     Head: Normocephalic and atraumatic.     Mouth/Throat:     Mouth: Mucous membranes are moist.     Pharynx: Oropharynx is clear.  Cardiovascular:     Rate and Rhythm: Normal rate and regular rhythm.  Pulmonary:     Effort: Pulmonary effort is normal.     Breath sounds: Normal breath sounds. No wheezing, rhonchi or rales.  Musculoskeletal:        General: Normal range of motion.  Skin:    General: Skin is warm and dry.  Neurological:     General: No focal deficit present.     Mental Status: She is alert and oriented to person, place, and time. Mental status is at baseline.  Psychiatric:        Mood and Affect: Mood normal.        Behavior: Behavior normal.        Thought Content: Thought content normal.        Judgment: Judgment normal.      Lab Results:  CBC    Component Value Date/Time   WBC 12.5 (H) 12/31/2022 1642   WBC 14.1 (H) 06/20/2022 0734   RBC 5.50 (H) 12/31/2022 1642   RBC 5.15 (H) 06/20/2022 0734   HGB 10.4 (L) 12/31/2022 1642   HCT 37.4 12/31/2022 1642   PLT 353 12/31/2022 1642   MCV 68 (L) 12/31/2022 1642   MCV 79 (L) 12/25/2012 0154   MCH 18.9 (L) 12/31/2022 1642    MCH 22.1 (L) 06/20/2022 0734   MCHC 27.8 (L) 12/31/2022 1642   MCHC 29.5 (L) 06/20/2022 0734   RDW 18.8 (H) 12/31/2022 1642   RDW 13.4 12/25/2012 0154   LYMPHSABS 2.6 12/11/2021 1502   LYMPHSABS 0.7 (L) 12/25/2012 0154   MONOABS 1.1 (H) 08/17/2019 2125   MONOABS 0.1 (L) 12/25/2012 0154   EOSABS 0.3 12/11/2021 1502   EOSABS 0.0 12/25/2012 0154  BASOSABS 0.1 12/11/2021 1502   BASOSABS 0.0 12/25/2012 0154    BMET    Component Value Date/Time   NA 130 (L) 12/31/2022 1642   NA 133 (L) 12/26/2012 0545   K 5.4 (H) 12/31/2022 1642   K 4.7 12/26/2012 0545   CL 93 (L) 12/31/2022 1642   CL 105 12/26/2012 0545   CO2 22 12/31/2022 1642   CO2 16 (L) 12/26/2012 0545   GLUCOSE 230 (H) 12/31/2022 1642   GLUCOSE 287 (H) 06/20/2022 0734   GLUCOSE 389 (H) 12/26/2012 0545   BUN 18 12/31/2022 1642   BUN 24 (H) 12/26/2012 0545   CREATININE 1.09 (H) 12/31/2022 1642   CREATININE 1.16 12/26/2012 0545   CALCIUM 9.6 12/31/2022 1642   CALCIUM 6.9 (LL) 12/26/2012 0545   GFRNONAA >60 06/20/2022 0734   GFRNONAA 48 (L) 12/26/2012 0545   GFRAA 45 (L) 08/02/2020 1642   GFRAA 56 (L) 12/26/2012 0545    BNP    Component Value Date/Time   BNP 171.1 (H) 09/24/2020 0358    ProBNP No results found for: "PROBNP"  Imaging: No results found.   Assessment & Plan:   Physical deconditioning - Referring to physical therapy  Asthma - Stable interval; No acute respiratory symptoms or recent exacerbations. Prefers nebulizer to hand held inhaler. Patient is looking into getting portable nebulizer machine to use when travelling.  - Refill Budesonide (Pulmicort) and Arformoterol (Brovana) nebulizer twice daily  - Send in Zpack to have on hand for bronchitis when travelling      Glenford Bayley, NP 01/07/2023

## 2023-01-07 NOTE — Telephone Encounter (Signed)
Faxed

## 2023-01-07 NOTE — Assessment & Plan Note (Addendum)
-   Stable interval; No acute respiratory symptoms or recent exacerbations. Prefers nebulizer to hand held inhaler. Patient is looking into getting portable nebulizer machine to use when travelling.  - Refill Budesonide (Pulmicort) and Arformoterol (Brovana) nebulizer twice daily  - Send in Zpack to have on hand for bronchitis when travelling

## 2023-01-07 NOTE — Telephone Encounter (Signed)
Ok to send last diabetic foot exam - documented in quality metrics

## 2023-01-07 NOTE — Telephone Encounter (Signed)
Copied from CRM [id number]. Topic: General - Other >> Jan 07, 2023 10:01 AM Macon Large wrote: Reason for CRM: Clydie Braun with [facility] requests that the office notes with the diabetic foot exam included to be faxed to fax# [phone]. Cb# [phone]

## 2023-01-07 NOTE — Assessment & Plan Note (Signed)
Referring to physical therapy.

## 2023-01-07 NOTE — Assessment & Plan Note (Deleted)
-   Stable interval; No acute respiratory symptoms. Patient is looking into getting portable nebulizer machine to use when travelling. Prefers nebulizer to hand held inhaler.  - Refill budesonide and arformoterol nebulizer - Send in Zpack to have on hand for bronchitis when travelling

## 2023-01-07 NOTE — Patient Instructions (Addendum)
-   Go through primary care for walking cane  - You will need to get a portable nebulizer on something like amazon/ directed home medical or [email or web address]   - Refill budesonide and arformoterol   - Send in Zpack to have on hand while traveling to take only as needed for bronchitis    Follow-up: - 6 months with Dr. Belia Heman

## 2023-01-09 ENCOUNTER — Telehealth: Payer: Self-pay | Admitting: Primary Care

## 2023-01-09 DIAGNOSIS — J4550 Severe persistent asthma, uncomplicated: Secondary | ICD-10-CM

## 2023-01-09 MED ORDER — ARFORMOTEROL TARTRATE 15 MCG/2ML IN NEBU: 15.0000 ug | INHALATION_SOLUTION | Freq: Two times a day (BID) | RESPIRATORY_TRACT | 2 refills | Status: DC

## 2023-01-09 MED ORDER — BUDESONIDE 0.5 MG/2ML IN SUSP: 0.5000 mg | Freq: Two times a day (BID) | RESPIRATORY_TRACT | 3 refills | Status: DC

## 2023-01-09 NOTE — Telephone Encounter (Signed)
Is asking for the meds to be sent to [facility] [address] instead of [facility] and is asking for a 90 day supply of both medications

## 2023-01-09 NOTE — Telephone Encounter (Signed)
I spoke with the patient's daughter(DPR), she said [facility] was to expensive and she would like the medications that were sent in yesterday sent to [facility], except for the antibiotic.  I have sent the prescriptions to [facility]. The patient's daughter is aware.  Nothing further needed.

## 2023-01-11 ENCOUNTER — Other Ambulatory Visit: Payer: Self-pay

## 2023-01-11 MED ORDER — IPRATROPIUM-ALBUTEROL 0.5-2.5 (3) MG/3ML IN SOLN: 3.0000 mL | Freq: Four times a day (QID) | RESPIRATORY_TRACT | 3 refills | Status: DC | PRN

## 2023-01-14 ENCOUNTER — Telehealth: Payer: Self-pay | Admitting: Family Medicine

## 2023-01-14 ENCOUNTER — Telehealth: Payer: Self-pay | Admitting: Primary Care

## 2023-01-14 MED ORDER — METFORMIN HCL 1000 MG PO TABS: 1000.0000 mg | ORAL_TABLET | Freq: Two times a day (BID) | ORAL | 1 refills | Status: DC

## 2023-01-14 MED ORDER — EMPAGLIFLOZIN 25 MG PO TABS: 25.0000 mg | ORAL_TABLET | Freq: Every day | ORAL | 1 refills | Status: DC

## 2023-01-14 NOTE — Telephone Encounter (Signed)
[facility] is requesting prescription refill metFORMIN (GLUCOPHAGE) 1000 MG tablet   Please advise

## 2023-01-14 NOTE — Telephone Encounter (Signed)
[facility] pharmacy is requesting prescription refill empagliflozin (JARDIANCE) 25 MG TABS tablet  Please advise

## 2023-01-15 NOTE — Telephone Encounter (Signed)
Nebulizer solutions to be submitted under Medicare Part B.

## 2023-01-16 NOTE — Telephone Encounter (Signed)
I spoke with the patient's daughter (DPR), it is the [place] that insurance will not pay for.  I spoke with [facility] and they had not ran it under Medicare B. Once they ran it under Medicare B it was approved, but they would have to order it and it would not be there until tomorrow.  I have notified the patient's daughter.  Nothing further needed.

## 2023-01-23 ENCOUNTER — Ambulatory Visit: Payer: Medicare Other | Admitting: Internal Medicine

## 2023-01-24 ENCOUNTER — Ambulatory Visit: Payer: Medicare Other | Admitting: Internal Medicine

## 2023-02-04 ENCOUNTER — Other Ambulatory Visit: Payer: Medicare Other | Admitting: Pharmacist

## 2023-02-04 ENCOUNTER — Telehealth: Payer: Self-pay | Admitting: Pharmacist

## 2023-02-04 NOTE — Progress Notes (Signed)
   02/04/2023  Patient ID: Whitney Fleming, female   DOB: 1946-04-02, 77 y.o.   MRN: [id number]  Place call to reach patient/daughter today as scheduled. Reach daughter Troyce Meachem (listed on DPR in chart) who requests to cancel appointment for today as patient is currently out of [place] for a few months, not back until around December.   Offer to reschedule appointment, but daughter prefers to call back to reschedule when patient returns. Provide contact information for clinical pharmacist.  Estelle Grumbles, PharmD, [facility] [phone]

## 2023-03-11 ENCOUNTER — Other Ambulatory Visit: Payer: Self-pay | Admitting: Family Medicine

## 2023-03-23 ENCOUNTER — Other Ambulatory Visit: Payer: Self-pay | Admitting: Family Medicine

## 2023-03-25 NOTE — Telephone Encounter (Signed)
Called pt - left message on machine to return call and schedule OV.

## 2023-03-25 NOTE — Telephone Encounter (Signed)
Patient will need an office visit for further refills. Requested Prescriptions  Pending Prescriptions Disp Refills   sitaGLIPtin (JANUVIA) 100 MG tablet [Pharmacy Med Name: JANUVIA 100MG  TABLETS] 30 tablet 0    Sig: TAKE 1 TABLET(100 MG) BY MOUTH DAILY     Endocrinology:  Diabetes - DPP-4 Inhibitors Failed - 03/23/2023  3:42 AM      Failed - HBA1C is between 0 and 7.9 and within 180 days    Hemoglobin A1C  Date Value Ref Range Status  12/25/2012 7.9 (H) 4.2 - 6.3 % Final    Comment:    The American Diabetes Association recommends that a primary goal of therapy should be <7% and that physicians should reevaluate the treatment regimen in patients with HbA1c values consistently >8%.    Hgb A1c MFr Bld  Date Value Ref Range Status  12/31/2022 9.5 (H) 4.8 - 5.6 % Final    Comment:             Prediabetes: 5.7 - 6.4          Diabetes: >6.4          Glycemic control for adults with diabetes: <7.0          Failed - Cr in normal range and within 360 days    Creatinine  Date Value Ref Range Status  12/26/2012 1.16 0.60 - 1.30 mg/dL Final   Creatinine, Ser  Date Value Ref Range Status  12/31/2022 1.09 (H) 0.57 - 1.00 mg/dL Final         Failed - Valid encounter within last 6 months    Recent Outpatient Visits           2 months ago Welcome to [employer] preventive visit   [facility] [place], Marzella Schlein, MD   6 months ago Type 2 diabetes mellitus with other circulatory complication, without long-term current use of insulin [facility])   [place] [facility] [place], Marzella Schlein, MD   9 months ago Type 2 diabetes mellitus with other circulatory complication, without long-term current use of insulin [facility])   [place] [facility] [place], Marzella Schlein, MD   1 year ago Annual physical exam   [facility] Caro Laroche, DO   1 year ago Type 2 diabetes mellitus with other circulatory complication,  without long-term current use of insulin [facility])    [facility] [place], Marzella Schlein, MD

## 2023-04-18 ENCOUNTER — Other Ambulatory Visit: Payer: Self-pay

## 2023-04-18 DIAGNOSIS — E1165 Type 2 diabetes mellitus with hyperglycemia: Secondary | ICD-10-CM

## 2023-04-18 DIAGNOSIS — E1159 Type 2 diabetes mellitus with other circulatory complications: Secondary | ICD-10-CM

## 2023-04-18 MED ORDER — ACCU-CHEK GUIDE VI STRP: ORAL_STRIP | 3 refills | Status: DC

## 2023-05-06 ENCOUNTER — Telehealth: Payer: Self-pay | Admitting: Internal Medicine

## 2023-05-06 DIAGNOSIS — J4550 Severe persistent asthma, uncomplicated: Secondary | ICD-10-CM

## 2023-05-06 MED ORDER — ARFORMOTEROL TARTRATE 15 MCG/2ML IN NEBU: 15.0000 ug | INHALATION_SOLUTION | Freq: Two times a day (BID) | RESPIRATORY_TRACT | 0 refills | Status: DC

## 2023-05-06 NOTE — Telephone Encounter (Signed)
Refill sent to [facility]. Manisha Kimmey advised.  Nothing further needed.

## 2023-05-06 NOTE — Telephone Encounter (Signed)
Patients daughter requesting refill of arformoterol sent to [facility] in [place] to last until next appointment 06/24/2023 with Dr. Belia Heman. Please advise and call back

## 2023-05-28 ENCOUNTER — Ambulatory Visit (INDEPENDENT_AMBULATORY_CARE_PROVIDER_SITE_OTHER): Payer: Medicare Other | Admitting: Family Medicine

## 2023-05-28 ENCOUNTER — Encounter: Payer: Self-pay | Admitting: Family Medicine

## 2023-05-28 VITALS — BP 109/60 | HR 94 | Ht 61.0 in | Wt 150.5 lb

## 2023-05-28 DIAGNOSIS — I152 Hypertension secondary to endocrine disorders: Secondary | ICD-10-CM

## 2023-05-28 DIAGNOSIS — E1159 Type 2 diabetes mellitus with other circulatory complications: Secondary | ICD-10-CM | POA: Diagnosis not present

## 2023-05-28 DIAGNOSIS — W19XXXA Unspecified fall, initial encounter: Secondary | ICD-10-CM

## 2023-05-28 DIAGNOSIS — J411 Mucopurulent chronic bronchitis: Secondary | ICD-10-CM | POA: Diagnosis not present

## 2023-05-28 DIAGNOSIS — Z23 Encounter for immunization: Secondary | ICD-10-CM | POA: Diagnosis not present

## 2023-05-28 DIAGNOSIS — E785 Hyperlipidemia, unspecified: Secondary | ICD-10-CM

## 2023-05-28 DIAGNOSIS — E1169 Type 2 diabetes mellitus with other specified complication: Secondary | ICD-10-CM | POA: Diagnosis not present

## 2023-05-28 DIAGNOSIS — Z7984 Long term (current) use of oral hypoglycemic drugs: Secondary | ICD-10-CM

## 2023-05-28 DIAGNOSIS — E559 Vitamin D deficiency, unspecified: Secondary | ICD-10-CM

## 2023-05-28 DIAGNOSIS — Z794 Long term (current) use of insulin: Secondary | ICD-10-CM

## 2023-05-28 MED ORDER — BD PEN NEEDLE NANO U/F 32G X 4 MM MISC: 3 refills | Status: AC

## 2023-05-28 NOTE — Assessment & Plan Note (Signed)
Cholesterol levels were checked in July and were within normal limits. No need for recheck today. - Continue current medication: Crestor 5 mg daily

## 2023-05-28 NOTE — Assessment & Plan Note (Signed)
Lung sounds are improved. Current medications appear effective.  - Continue current medications: Brovana nebulizers, Pulmicort nebulizer, Symbicort

## 2023-05-28 NOTE — Assessment & Plan Note (Signed)
Blood pressure is well-controlled on current medications. - Continue current medications: amlodipine 5 mg daily, carvedilol 12.5 mg BID

## 2023-05-28 NOTE — Assessment & Plan Note (Signed)
Last A1c was 9.5% - uncontrolled, hyperglycemia Plan to recheck A1c today along with kidney and liver function tests to assess current control and adjust treatment if necessary. - Order A1c, kidney, and liver function tests - Continue current medications: Jardiance 25 mg daily, Basaglar insulin 25 units daily, metformin 1000 mg BID, Januvia 100 mg daily

## 2023-05-28 NOTE — Progress Notes (Signed)
Established patient visit   Patient: Whitney Fleming   DOB: 1945/12/14   77 y.o. Female  MRN: [id number] Visit Date: 05/28/2023  Today's healthcare provider: Shirlee Latch, MD   Chief Complaint  Patient presents with   Medical Management of Chronic Issues    Lorenda Peck is present and interpreting for visit today.    Diabetes   Hypertension   Subjective    Diabetes  Hypertension   HPI     Medical Management of Chronic Issues    Additional comments: Lorenda Peck is present and interpreting for visit today.         Diabetes   Current treatment includes oral agent (monotherapy).  Compliance with treatment is excellent.  Blurred vision: Absent.  Chest pain: Absent.  Fatigue: Present.  Foot Ulcerations: Absent.  Nausea: Absent.  Paresthesia of the feet: Absent.  Polydipsia: Absent.  Polyuria: Absent.  Visual changes: Absent.  Vomiting: Absent.  Home glucose fasting ranges: >200 but not fasting.  Insulin Regimen: After breakfast sometimes.  Eye exam is current.        Hypertension   Anxiety: Absent.  Blurred vision: Absent.  Chest pain: Absent.  Chest pressure/discomfort: Absent.  Dyspnea: Absent.  Headaches: Absent.  Lower extremity edema: Absent.  Orthopnea: Absent.  Palpitations: Absent.  Paroxysmal nocturnal dyspnea: Absent.  Syncope: Absent.        Comments   Dexa scan declined Eye Exam - aware to update Shingles - Aware to visit pharamcy      Last edited by Acey Lav, CMA on 05/28/2023  3:55 PM.       Discussed the use of AI scribe software for clinical note transcription with the patient, who gave verbal consent to proceed.  History of Present Illness   The patient, a 77 year old female with a history of hypertension, hyperlipidemia, type two diabetes, and chronic COPD, presents with her daughter for a routine follow-up. The patient's current medications include amlodipine, Brovana nebulizers, Pulmicort nebulizer, Symbicort, carvedilol, Jardiance,  Basaglar insulin, metformin, Singulair, Crestor, and Januvia.  The patient's chief complaint is pain in her buttocks, which she attributes to a fall two weeks ago. She reports that she fell while getting out of the car in her garage. The patient's daughter reports that the patient declined to go to the emergency room at the time of the fall. The patient reports that the pain is most noticeable when sitting down.  The patient's daughter also expresses concern about her mother's hearing. She reports that she had previously visited an audiologist, but the patient was resistant to the testing process. The daughter is considering purchasing an over-the-counter hearing aid.         Medications: Outpatient Medications Prior to Visit  Medication Sig   Accu-Chek FastClix Lancets MISC Use as directed to check blood glucose twice daily   AMBULATORY NON FORMULARY MEDICATION Medication Name: Aero chamber use as directed. DX:J45.909   amLODipine (NORVASC) 5 MG tablet Take 1 tablet (5 mg total) by mouth daily.   arformoterol (BROVANA) 15 MCG/2ML NEBU Take 2 mLs (15 mcg total) by nebulization in the morning and at bedtime.   aspirin EC 81 MG tablet Take 1 tablet (81 mg total) by mouth daily. Swallow whole.   budesonide (PULMICORT) 0.5 MG/2ML nebulizer solution Take 2 mLs (0.5 mg total) by nebulization in the morning and at bedtime.   budesonide-formoterol (SYMBICORT) 160-4.5 MCG/ACT inhaler Inhale 3 puffs into the lungs 2 (two) times daily.   carvedilol (COREG) 12.5 MG tablet  TAKE 1 TABLET(12.5 MG) BY MOUTH TWICE DAILY WITH A MEAL   empagliflozin (JARDIANCE) 25 MG TABS tablet Take 1 tablet (25 mg total) by mouth daily.   famotidine (PEPCID) 20 MG tablet Take 1 tablet (20 mg total) by mouth 2 (two) times daily.   glucose blood (ACCU-CHEK GUIDE) test strip USE  STRIP TO CHECK GLUCOSE TWICE DAILY AS DIRECTEDUSE  STRIP TO CHECK GLUCOSE TWICE DAILY AS DIRECTED   Insulin Glargine (BASAGLAR KWIKPEN) 100 UNIT/ML  Inject 25 Units into the skin daily.   ipratropium-albuterol (DUONEB) 0.5-2.5 (3) MG/3ML SOLN Take 3 mLs by nebulization every 6 (six) hours as needed (J45.50). Dx J45.50   metFORMIN (GLUCOPHAGE) 1000 MG tablet Take 1 tablet (1,000 mg total) by mouth 2 (two) times daily with a meal.   montelukast (SINGULAIR) 10 MG tablet Take 1 tablet (10 mg total) by mouth at bedtime.   rosuvastatin (CRESTOR) 5 MG tablet TAKE 1 TABLET(5 MG) BY MOUTH DAILY   sitaGLIPtin (JANUVIA) 100 MG tablet TAKE 1 TABLET(100 MG) BY MOUTH DAILY   [DISCONTINUED] Insulin Pen Needle (NOVOFINE PEN NEEDLE) 32G X 6 MM MISC Use as directed to inject insulin   [DISCONTINUED] azithromycin (ZITHROMAX Z-PAK) 250 MG tablet Zpack as directed   [DISCONTINUED] guaiFENesin (MUCINEX) 600 MG 12 hr tablet Take 1 tablet (600 mg total) by mouth 2 (two) times daily as needed.   No facility-administered medications prior to visit.    Review of Systems     Objective    BP 109/60 (BP Location: Right Arm, Patient Position: Sitting, Cuff Size: Large)   Pulse 94   Ht 5\' 1"  (1.549 m)   Wt 150 lb 8 oz (68.3 kg)   SpO2 100%   BMI 28.44 kg/m    Physical Exam Vitals reviewed.  Constitutional:      General: She is not in acute distress.    Appearance: Normal appearance. She is well-developed. She is not diaphoretic.  HENT:     Head: Normocephalic and atraumatic.  Eyes:     General: No scleral icterus.    Conjunctiva/sclera: Conjunctivae normal.  Neck:     Thyroid: No thyromegaly.  Cardiovascular:     Rate and Rhythm: Normal rate and regular rhythm.     Heart sounds: Normal heart sounds. No murmur heard. Pulmonary:     Effort: Pulmonary effort is normal. No respiratory distress.     Breath sounds: Normal breath sounds. No wheezing, rhonchi or rales.  Musculoskeletal:     Cervical back: Neck supple.     Right lower leg: No edema.     Left lower leg: No edema.     Comments: Mild TTP diffusely along low back  Lymphadenopathy:      Cervical: No cervical adenopathy.  Skin:    General: Skin is warm and dry.     Findings: No rash.  Neurological:     Mental Status: She is alert. Mental status is at baseline.      No results found for any visits on 05/28/23.  Assessment & Plan     Problem List Items Addressed This Visit       Cardiovascular and Mediastinum   Hypertension associated with diabetes (HCC) - Primary   Blood pressure is well-controlled on current medications. - Continue current medications: amlodipine 5 mg daily, carvedilol 12.5 mg BID      Relevant Orders   Comprehensive metabolic panel   Ambulatory referral to Home Health     Respiratory   COPD (chronic obstructive pulmonary  disease) (HCC)   Lung sounds are improved. Current medications appear effective.  - Continue current medications: Brovana nebulizers, Pulmicort nebulizer, Symbicort      Relevant Orders   Ambulatory referral to Home Health     Endocrine   Diabetes (HCC)   Last A1c was 9.5% - uncontrolled, hyperglycemia Plan to recheck A1c today along with kidney and liver function tests to assess current control and adjust treatment if necessary. - Order A1c, kidney, and liver function tests - Continue current medications: Jardiance 25 mg daily, Basaglar insulin 25 units daily, metformin 1000 mg BID, Januvia 100 mg daily      Relevant Orders   Hemoglobin A1c   Ambulatory referral to Home Health   Hyperlipidemia associated with type 2 diabetes mellitus (HCC)   Cholesterol levels were checked in July and were within normal limits. No need for recheck today. - Continue current medication: Crestor 5 mg daily      Relevant Orders   Comprehensive metabolic panel   Ambulatory referral to Home Health   Other Visit Diagnoses       Fall, initial encounter       Relevant Orders   Ambulatory referral to Home Health     Immunization due       Relevant Orders   Flu Vaccine Trivalent High Dose (Fluad) (Completed)     Avitaminosis D        Relevant Orders   VITAMIN D 25 Hydroxy (Vit-D Deficiency, Fractures)       Assessment and Plan    Fall with Buttock Pain Experienced a fall two weeks ago, landing on buttocks. Reports pain when sitting but no visible bruising. No numbness or tingling in legs. Able to walk and perform daily activities. Discussed use of acetaminophen or ibuprofen for pain management. Physical therapy recommended for strengthening and fall prevention. Home health aide for assistance with showering and self-care. Occupational therapy for self-care activities. - Recommend acetaminophen or ibuprofen for pain management - Order physical therapy for strengthening and fall prevention - Arrange home health aide for assistance with showering and self-care - Provide occupational therapy for self-care activities  Hearing Loss Requires a hearing aid but is reluctant to undergo audiology testing. Discussed the importance of specialized equipment for accurate fitting. Explained that over-the-counter options may not be effective without proper measurements. - Encourage follow-up with audiology for proper hearing aid fitting  General Health Maintenance Due for a flu shot and routine labs. Medicare wellness visit scheduled for summer. - Administer flu shot - Draw labs for A1c, kidney, and liver function - Schedule Medicare wellness visit in six months  Follow-up - Follow up in three months if A1c is not well-controlled - Home health to contact for scheduling physical therapy and home health aide services - Order pen needles from [facility].          Return in about 6 months (around 11/26/2023) for AWV.       Shirlee Latch, MD  [facility] [phone] (phone) [phone] (fax)  [facility]

## 2023-05-29 LAB — COMPREHENSIVE METABOLIC PANEL
ALT: 8 [IU]/L (ref 0–32)
AST: 10 [IU]/L (ref 0–40)
Albumin: 4 g/dL (ref 3.8–4.8)
Alkaline Phosphatase: 88 [IU]/L (ref 44–121)
BUN/Creatinine Ratio: 14 (ref 12–28)
BUN: 17 mg/dL (ref 8–27)
Bilirubin Total: 0.3 mg/dL (ref 0.0–1.2)
CO2: 22 mmol/L (ref 20–29)
Calcium: 9.1 mg/dL (ref 8.7–10.3)
Chloride: 96 mmol/L (ref 96–106)
Creatinine, Ser: 1.2 mg/dL — ABNORMAL HIGH (ref 0.57–1.00)
Globulin, Total: 2.6 g/dL (ref 1.5–4.5)
Glucose: 338 mg/dL — ABNORMAL HIGH (ref 70–99)
Potassium: 5.5 mmol/L — ABNORMAL HIGH (ref 3.5–5.2)
Sodium: 133 mmol/L — ABNORMAL LOW (ref 134–144)
Total Protein: 6.6 g/dL (ref 6.0–8.5)
eGFR: 46 mL/min/{1.73_m2} — ABNORMAL LOW (ref >59)

## 2023-05-29 LAB — VITAMIN D 25 HYDROXY (VIT D DEFICIENCY, FRACTURES): Vit D, 25-Hydroxy: 10.1 ng/mL — ABNORMAL LOW (ref 30.0–100.0)

## 2023-05-29 LAB — HEMOGLOBIN A1C
Est. average glucose Bld gHb Est-mCnc: 246 mg/dL
Hgb A1c MFr Bld: 10.2 % — ABNORMAL HIGH (ref 4.8–5.6)

## 2023-05-30 ENCOUNTER — Other Ambulatory Visit: Payer: Self-pay | Admitting: Family Medicine

## 2023-05-30 MED ORDER — BASAGLAR KWIKPEN 100 UNIT/ML ~~LOC~~ SOPN: 25.0000 [IU] | PEN_INJECTOR | Freq: Every day | SUBCUTANEOUS | 2 refills | Status: DC

## 2023-05-30 NOTE — Telephone Encounter (Signed)
Requested Prescriptions  Pending Prescriptions Disp Refills   Insulin Glargine (BASAGLAR KWIKPEN) 100 UNIT/ML 15 mL 2    Sig: Inject 25 Units into the skin daily.     Endocrinology:  Diabetes - Insulins Failed - 05/30/2023  3:43 PM      Failed - HBA1C is between 0 and 7.9 and within 180 days    Hemoglobin A1C  Date Value Ref Range Status  12/25/2012 7.9 (H) 4.2 - 6.3 % Final    Comment:    The American Diabetes Association recommends that a primary goal of therapy should be <7% and that physicians should reevaluate the treatment regimen in patients with HbA1c values consistently >8%.    Hgb A1c MFr Bld  Date Value Ref Range Status  05/28/2023 10.2 (H) 4.8 - 5.6 % Final    Comment:             Prediabetes: 5.7 - 6.4          Diabetes: >6.4          Glycemic control for adults with diabetes: <7.0          Passed - Valid encounter within last 6 months    Recent Outpatient Visits           2 days ago Hypertension associated with diabetes [facility])   [facility] [place], Marzella Schlein, MD   5 months ago Welcome to [employer] preventive visit   [facility] [place], Marzella Schlein, MD   8 months ago Type 2 diabetes mellitus with other circulatory complication, without long-term current use of insulin [facility])   [facility] [place], Marzella Schlein, MD   11 months ago Type 2 diabetes mellitus with other circulatory complication, without long-term current use of insulin [facility])    [facility] [place], Marzella Schlein, MD   1 year ago Annual physical exam   [facility] Caro Laroche, DO       Future Appointments             In 3 weeks Erin Fulling, MD [facility] [place]   In 7 months [place], Marzella Schlein, MD [facility], [facility]

## 2023-05-30 NOTE — Telephone Encounter (Signed)
Medication Refill -  Most Recent Primary Care Visit:  Provider: Erasmo Downer  Department: [facility]  Visit Type: OFFICE VISIT  Date: 05/28/2023  Medication: Insulin Glargine (BASAGLAR KWIKPEN) 100 UNIT/ML   Has the patient contacted their pharmacy? Yes (Agent: If no, request that the patient contact the pharmacy for the refill. If patient does not wish to contact the pharmacy document the reason why and proceed with request.) (Agent: If yes, when and what did the pharmacy advise?)  Is this the correct pharmacy for this prescription? Yes If no, delete pharmacy and type the correct one.  This is the patient's preferred pharmacy:    [id number] [place], [place] - [address] AT [facility] OF [address] [place] [address] Phone: [phone] Fax: [phone]    Has the prescription been filled recently? Yes  Is the patient out of the medication? Yes  Has the patient been seen for an appointment in the last year OR does the patient have an upcoming appointment? Yes  Can we respond through MyChart? Yes  Agent: Please be advised that Rx refills may take up to 3 business days. We ask that you follow-up with your pharmacy.

## 2023-05-31 ENCOUNTER — Telehealth: Payer: Self-pay | Admitting: Family Medicine

## 2023-05-31 NOTE — Telephone Encounter (Signed)
That's fine

## 2023-05-31 NOTE — Telephone Encounter (Signed)
Attempted to call Vernona Rieger. No Answer. It is not clear if her voicemail is secured, so I only left a message asking her to call back about a mutual patient.   PEC if she does return call, you can advise Dr. Sherrie Mustache approves their request for PT 1x8 plus OT & Social Work Evaluation.

## 2023-05-31 NOTE — Telephone Encounter (Signed)
Home Health Verbal Orders - Caller/Agency: Vernona Rieger with [facility] Number:  [phone] Service Requested: Lelon Mast work/PT and OT Frequency: pt 1x8/ OT and social work eval Any new concerns about the patient? no

## 2023-06-03 NOTE — Telephone Encounter (Signed)
Verbal orders given to Vernona Rieger with [facility].

## 2023-06-10 ENCOUNTER — Telehealth: Payer: Self-pay | Admitting: Family Medicine

## 2023-06-10 NOTE — Telephone Encounter (Signed)
LRF 12/11/22 #90 2RF 9 month supply. Refill not needed until end of March 2025 Rx not sent

## 2023-06-10 NOTE — Telephone Encounter (Signed)
Received fax from [facility] asking for refills on Rosuvastatin 5 mg. #90

## 2023-06-21 ENCOUNTER — Telehealth: Payer: Self-pay | Admitting: Family Medicine

## 2023-06-21 DIAGNOSIS — Z9181 History of falling: Secondary | ICD-10-CM

## 2023-06-21 DIAGNOSIS — J449 Chronic obstructive pulmonary disease, unspecified: Secondary | ICD-10-CM | POA: Diagnosis not present

## 2023-06-21 DIAGNOSIS — E1165 Type 2 diabetes mellitus with hyperglycemia: Secondary | ICD-10-CM | POA: Diagnosis not present

## 2023-06-21 DIAGNOSIS — Z794 Long term (current) use of insulin: Secondary | ICD-10-CM

## 2023-06-21 DIAGNOSIS — I152 Hypertension secondary to endocrine disorders: Secondary | ICD-10-CM | POA: Diagnosis not present

## 2023-06-21 DIAGNOSIS — H9190 Unspecified hearing loss, unspecified ear: Secondary | ICD-10-CM

## 2023-06-21 DIAGNOSIS — Z7951 Long term (current) use of inhaled steroids: Secondary | ICD-10-CM

## 2023-06-21 DIAGNOSIS — E1169 Type 2 diabetes mellitus with other specified complication: Secondary | ICD-10-CM

## 2023-06-21 DIAGNOSIS — E785 Hyperlipidemia, unspecified: Secondary | ICD-10-CM

## 2023-06-21 DIAGNOSIS — E1159 Type 2 diabetes mellitus with other circulatory complications: Secondary | ICD-10-CM | POA: Diagnosis not present

## 2023-06-21 DIAGNOSIS — Z7984 Long term (current) use of oral hypoglycemic drugs: Secondary | ICD-10-CM

## 2023-06-21 NOTE — Telephone Encounter (Signed)
 Paperwork returned back from from provider and faxed 06/21/23 back to home health agency [facility]

## 2023-06-21 NOTE — Telephone Encounter (Signed)
 Paperwork received via fax today for home health certification. Front office portion completed and placed in provider mailbox VM

## 2023-06-24 ENCOUNTER — Ambulatory Visit: Payer: Medicare Other | Admitting: Internal Medicine

## 2023-07-11 ENCOUNTER — Encounter: Payer: Self-pay | Admitting: Family Medicine

## 2023-07-11 ENCOUNTER — Ambulatory Visit: Payer: Medicare Other | Admitting: Family Medicine

## 2023-07-11 VITALS — BP 129/54 | HR 86 | Ht 61.0 in | Wt 154.1 lb

## 2023-07-11 DIAGNOSIS — E559 Vitamin D deficiency, unspecified: Secondary | ICD-10-CM | POA: Diagnosis not present

## 2023-07-11 DIAGNOSIS — E875 Hyperkalemia: Secondary | ICD-10-CM

## 2023-07-11 DIAGNOSIS — E1159 Type 2 diabetes mellitus with other circulatory complications: Secondary | ICD-10-CM

## 2023-07-11 DIAGNOSIS — Z7984 Long term (current) use of oral hypoglycemic drugs: Secondary | ICD-10-CM

## 2023-07-11 MED ORDER — IPRATROPIUM-ALBUTEROL 0.5-2.5 (3) MG/3ML IN SOLN: 3.0000 mL | Freq: Four times a day (QID) | RESPIRATORY_TRACT | 3 refills | Status: DC | PRN

## 2023-07-11 MED ORDER — BASAGLAR KWIKPEN 100 UNIT/ML ~~LOC~~ SOPN: 30.0000 [IU] | PEN_INJECTOR | Freq: Every day | SUBCUTANEOUS | 2 refills | Status: DC

## 2023-07-11 NOTE — Progress Notes (Signed)
Established patient visit   Patient: Whitney Fleming   DOB: November 26, 1945   78 y.o. Female  MRN: [id number] Visit Date: 07/11/2023  Today's healthcare provider: Shirlee Latch, MD   Chief Complaint  Patient presents with   Medical Management of Chronic Issues   Diabetes   Subjective    Diabetes   HPI     Diabetes   Chest pain: Present.  Fatigue: Present.  Paresthesia of the feet: Present (Left leg).  Home glucose fasting ranges  from 140-180 mg/dl.  Insulin regimen: pre-breakfast.  Eye exam is current.  Patient does not see a podiatrist.      Last edited by Acey Lav, CMA on 07/11/2023  4:10 PM.       Discussed the use of AI scribe software for clinical note transcription with the patient, who gave verbal consent to proceed.  History of Present Illness   The patient, with a history of diabetes, hypertension, and COPD, presents with improved blood sugar levels due to insulin therapy. The patient's fasting blood sugar levels have decreased to under 200, which is a significant improvement for her. She is currently on 30 units of insulin daily. However, the patient is experiencing issues with dehydration and leg swelling. The patient's family member reports that the patient often stands for long periods and consumes high-sodium foods, which may contribute to the leg swelling. The patient also has a history of hospitalization during a recent trip to [place], but the details of this hospitalization are not clear. The patient is currently undergoing physical therapy and is due to see a pulmonologist next week.         Medications: Outpatient Medications Prior to Visit  Medication Sig   Accu-Chek FastClix Lancets MISC Use as directed to check blood glucose twice daily   AMBULATORY NON FORMULARY MEDICATION Medication Name: Aero chamber use as directed. DX:J45.909   amLODipine (NORVASC) 5 MG tablet Take 1 tablet (5 mg total) by mouth daily.   arformoterol (BROVANA) 15  MCG/2ML NEBU Take 2 mLs (15 mcg total) by nebulization in the morning and at bedtime.   aspirin EC 81 MG tablet Take 1 tablet (81 mg total) by mouth daily. Swallow whole.   budesonide (PULMICORT) 0.5 MG/2ML nebulizer solution Take 2 mLs (0.5 mg total) by nebulization in the morning and at bedtime.   budesonide-formoterol (SYMBICORT) 160-4.5 MCG/ACT inhaler Inhale 3 puffs into the lungs 2 (two) times daily.   carvedilol (COREG) 12.5 MG tablet TAKE 1 TABLET(12.5 MG) BY MOUTH TWICE DAILY WITH A MEAL   empagliflozin (JARDIANCE) 25 MG TABS tablet Take 1 tablet (25 mg total) by mouth daily.   famotidine (PEPCID) 20 MG tablet Take 1 tablet (20 mg total) by mouth 2 (two) times daily.   glucose blood (ACCU-CHEK GUIDE) test strip USE  STRIP TO CHECK GLUCOSE TWICE DAILY AS DIRECTEDUSE  STRIP TO CHECK GLUCOSE TWICE DAILY AS DIRECTED   Insulin Pen Needle (BD PEN NEEDLE NANO U/F) 32G X 4 MM MISC Use as directed to inject insulin   metFORMIN (GLUCOPHAGE) 1000 MG tablet Take 1 tablet (1,000 mg total) by mouth 2 (two) times daily with a meal.   montelukast (SINGULAIR) 10 MG tablet Take 1 tablet (10 mg total) by mouth at bedtime.   rosuvastatin (CRESTOR) 5 MG tablet TAKE 1 TABLET(5 MG) BY MOUTH DAILY   sitaGLIPtin (JANUVIA) 100 MG tablet TAKE 1 TABLET(100 MG) BY MOUTH DAILY   [DISCONTINUED] Insulin Glargine (BASAGLAR KWIKPEN) 100 UNIT/ML Inject 25 Units  into the skin daily.   [DISCONTINUED] ipratropium-albuterol (DUONEB) 0.5-2.5 (3) MG/3ML SOLN Take 3 mLs by nebulization every 6 (six) hours as needed (J45.50). Dx J45.50   No facility-administered medications prior to visit.    Review of Systems     Objective    BP (!) 129/54 (Cuff Size: Large)   Pulse 86   Ht 5\' 1"  (1.549 m)   Wt 154 lb 1.6 oz (69.9 kg)   SpO2 100%   BMI 29.12 kg/m    Physical Exam Vitals reviewed.  Constitutional:      General: She is not in acute distress.    Appearance: Normal appearance. She is well-developed. She is not  diaphoretic.  HENT:     Head: Normocephalic and atraumatic.  Eyes:     General: No scleral icterus.    Conjunctiva/sclera: Conjunctivae normal.  Neck:     Thyroid: No thyromegaly.  Cardiovascular:     Rate and Rhythm: Normal rate and regular rhythm.     Heart sounds: Normal heart sounds. No murmur heard. Pulmonary:     Effort: Pulmonary effort is normal. No respiratory distress.     Breath sounds: Wheezing (mild) present. No rhonchi or rales.  Musculoskeletal:     Cervical back: Neck supple.     Right lower leg: Edema (1+) present.     Left lower leg: Edema (1+) present.  Lymphadenopathy:     Cervical: No cervical adenopathy.  Skin:    General: Skin is warm and dry.     Findings: No rash.  Neurological:     Mental Status: She is alert and oriented to person, place, and time. Mental status is at baseline.  Psychiatric:        Mood and Affect: Mood normal.        Behavior: Behavior normal.      No results found for any visits on 07/11/23.  Assessment & Plan     Problem List Items Addressed This Visit       Endocrine   Diabetes (HCC) - Primary   Relevant Medications   Insulin Glargine (BASAGLAR KWIKPEN) 100 UNIT/ML   Other Relevant Orders   Hemoglobin A1c   Other Visit Diagnoses       Avitaminosis D       Relevant Orders   VITAMIN D 25 Hydroxy (Vit-D Deficiency, Fractures)     Serum potassium elevated       Relevant Orders   Comprehensive metabolic panel           Diabetes Mellitus Diabetes management is improving with current medications. Blood sugar levels have decreased to 170-180 mg/dL, a significant improvement from previous levels. Currently on Basaglar 30 units daily, metformin 1000 mg twice daily, Jardiance 25 mg daily, and Januvia 100 mg daily. Last A1c was 10.2%; new A1c will be checked next week. Plan to adjust insulin dosage based on A1c results to avoid hypoglycemia. - Continue Basaglar 30 units daily - Continue metformin 1000 mg twice daily -  Continue Jardiance 25 mg daily - Continue Januvia 100 mg daily - Order A1c test next week (was unable to get blood drawn today as couldn't find a vein)  Chronic Obstructive Pulmonary Disease (COPD) Reports increased dyspnea but is scheduled to see pulmonology next week. Currently using Duoneb, Pulmicort, and Symbicort. Recent nebulizer treatment has been beneficial. Emphasized the importance of continuing current medications and follow-up with pulmonology for further management. - Continue Duoneb - Continue Pulmicort - Continue Symbicort - Send Duoneb prescription to [facility]  Hypertension Blood pressure is well-controlled with current medications. Currently on amlodipine 5 mg daily and carvedilol 12.5 mg twice daily. Discussed potential contribution of amlodipine to peripheral edema but decided to maintain current regimen due to good blood pressure control. - Continue amlodipine 5 mg daily - Continue carvedilol 12.5 mg twice daily  Peripheral Edema Leg swelling likely due to prolonged standing, venous insufficiency, and possibly amlodipine. Advised to elevate legs, wear compression socks during the day, and monitor sodium intake. Discussed the importance of lifestyle modifications to manage symptoms. - Advise leg elevation - Recommend wearing compression socks during the day - Monitor sodium intake  General Health Maintenance Encouraged to stay hydrated, especially before lab tests, to facilitate blood draws. Physical therapy is ongoing; encouraged to continue exercising. Discussed the importance of regular physical activity and hydration for overall health. - Encourage increased water intake - Continue physical therapy - Encourage regular exercise  Follow-up - Follow up with pulmonology next week - Return for lab tests next week.          Return in about 6 months (around 01/08/2024) for CPE, as scheduled.       Shirlee Latch, MD  [facility] [phone] (phone) [phone] (fax)  [facility]

## 2023-07-12 ENCOUNTER — Telehealth: Payer: Self-pay | Admitting: Family Medicine

## 2023-07-12 NOTE — Telephone Encounter (Signed)
[facility] pharmacy is requesting PA Key: [id number] Ipratropium -Albuterol 0.5-2.5 (3) MG/ML solution

## 2023-07-15 NOTE — Telephone Encounter (Signed)
Recieved a fax from covermymeds for Ipratropium-Albuterol 0.5-2.5 (3)MG/ Solution. Patient is waiting   key: Whitney Fleming

## 2023-07-16 ENCOUNTER — Telehealth: Payer: Self-pay | Admitting: Internal Medicine

## 2023-07-16 NOTE — Telephone Encounter (Signed)
Pt mychart messaged that her pharmacy is waiting on a prior auth for her albuterol

## 2023-07-16 NOTE — Telephone Encounter (Signed)
I spoke with [facility] and they will file it under her Medicare Part B.   I notified the patient daughter (DPR).  Nothing further needed.

## 2023-07-17 ENCOUNTER — Encounter: Payer: Self-pay | Admitting: Internal Medicine

## 2023-07-17 ENCOUNTER — Ambulatory Visit: Payer: Medicare Other | Admitting: Internal Medicine

## 2023-07-17 VITALS — BP 136/60 | HR 85 | Temp 97.6°F | Ht 61.0 in | Wt 152.4 lb

## 2023-07-17 DIAGNOSIS — R0689 Other abnormalities of breathing: Secondary | ICD-10-CM

## 2023-07-17 DIAGNOSIS — G4733 Obstructive sleep apnea (adult) (pediatric): Secondary | ICD-10-CM

## 2023-07-17 DIAGNOSIS — J45909 Unspecified asthma, uncomplicated: Secondary | ICD-10-CM

## 2023-07-17 DIAGNOSIS — J4550 Severe persistent asthma, uncomplicated: Secondary | ICD-10-CM | POA: Diagnosis not present

## 2023-07-17 MED ORDER — IPRATROPIUM-ALBUTEROL 0.5-2.5 (3) MG/3ML IN SOLN: 3.0000 mL | Freq: Four times a day (QID) | RESPIRATORY_TRACT | 3 refills | Status: DC | PRN

## 2023-07-17 MED ORDER — ARFORMOTEROL TARTRATE 15 MCG/2ML IN NEBU: 15.0000 ug | INHALATION_SOLUTION | Freq: Two times a day (BID) | RESPIRATORY_TRACT | 5 refills | Status: DC

## 2023-07-17 MED ORDER — BUDESONIDE 0.5 MG/2ML IN SUSP: 0.5000 mg | Freq: Two times a day (BID) | RESPIRATORY_TRACT | 3 refills | Status: DC

## 2023-07-17 NOTE — Progress Notes (Signed)
 [facility]  Date: 07/17/2023  MRN# [id number] Whitney Fleming [birth year]  Whitney Fleming is a 78 y.o. old female seen in follow up for chief complaint of dyspnea.   SYNOPSIS Whitney Fleming is a 78 y.o. female  with severe allergic asthma.  She speaks [place] her daughter translates.  She has had 3 admissions to the hospital in 2019 with asthma exacerbations including most recently in September 2019.  She had not had further hospital admissions thus far this year but required 2 courses of prednisone  in May 2020 which caused her blood sugar to spike.   She previously had been evaluated for an injectable biologic agent for asthma but declined because she did not want injections. She had started MAINTENANCE INHALED NEB THERAPY  We previously considered her for Nucala  but PATIENT declined anything that involve injections.    **IgE 12/24/17; IGe 5.  **CXR 11/05/17; changes of chronic bronchitis, right heart enlargement.  **CBC 02/11/18>> absolute eosinophil count 800. **CBC 09/25/17; Abs eos count 300.    CC Follow-up assessment for asthma ASSESSMENT OF EXCESSIVE DAYTIME SLEEPINESS   HPI Chronic shortness of breath and dyspnea on exertion It seems that there is significant improvement of her symptoms with maintenance of nebulized therapy Respiratory insufficiency-patient could no longer use traditional inhaler therapy  Most of her history is provided by the daughter Previous office visit assessment for overnight pulse oximetry was within normal limits Patient did not meet criteria for oxygen therapy   No exacerbation at this time No evidence of heart failure at this time No evidence or signs of infection at this time No respiratory distress No fevers, chills, nausea, vomiting, diarrhea No evidence of lower extremity edema No evidence hemoptysis    Patient is seen today for problems and issues with sleep related to excessive daytime sleepiness Patient  has been having  sleep problems for many years Patient has been having excessive daytime sleepiness for a long time Patient has been having extreme fatigue and tiredness, lack of energy   Discussed sleep data and reviewed with patient.  Encouraged proper weight management.  Discussed driving precautions and its relationship with hypersomnolence.  Discussed operating dangerous equipment and its relationship with hypersomnolence.  Discussed sleep hygiene, and benefits of a fixed sleep waked time.  The importance of getting eight or more hours of sleep discussed with patient.  Discussed limiting the use of the computer and television before bedtime.  Decrease naps during the day, so night time sleep will become enhanced.  Limit caffeine, and sleep deprivation.  HTN, stroke, and heart failure are potential risk factors.   Discussed risk of untreated sleep apnea including cardiac arrhthymias, stroke, DM, pulm HTN.    EPWORTH SLEEP SCORE 12   Patient recently traveled to [place] While in [place] patient did not use her nebulizer for few days and was admitted to the hospital Patient recovered with nebulizers and CPAP therapy    Medication:    Current Outpatient Medications:    Accu-Chek FastClix Lancets MISC, Use as directed to check blood glucose twice daily, Disp: 100 each, Rfl: 1   AMBULATORY NON FORMULARY MEDICATION, Medication Name: Aero chamber use as directed. DX:J45.909, Disp: 1 each, Rfl: 0   amLODipine  (NORVASC ) 5 MG tablet, Take 1 tablet (5 mg total) by mouth daily., Disp: 90 tablet, Rfl: 1   arformoterol  (BROVANA ) 15 MCG/2ML NEBU, Take 2 mLs (15 mcg total) by nebulization in the morning and at bedtime., Disp: 360 mL, Rfl: 0   aspirin   EC 81 MG tablet, Take 1 tablet (81 mg total) by mouth daily. Swallow whole., Disp: 90 tablet, Rfl: 3   budesonide  (PULMICORT ) 0.5 MG/2ML nebulizer solution, Take 2 mLs (0.5 mg total) by nebulization in the morning and at bedtime., Disp: 360 mL, Rfl: 3    budesonide -formoterol  (SYMBICORT ) 160-4.5 MCG/ACT inhaler, Inhale 3 puffs into the lungs 2 (two) times daily., Disp: 30.6 g, Rfl: 0   carvedilol  (COREG ) 12.5 MG tablet, TAKE 1 TABLET(12.5 MG) BY MOUTH TWICE DAILY WITH A MEAL, Disp: 180 tablet, Rfl: 1   empagliflozin  (JARDIANCE ) 25 MG TABS tablet, Take 1 tablet (25 mg total) by mouth daily., Disp: 90 tablet, Rfl: 1   famotidine  (PEPCID ) 20 MG tablet, Take 1 tablet (20 mg total) by mouth 2 (two) times daily., Disp: 180 tablet, Rfl: 1   glucose blood (ACCU-CHEK GUIDE) test strip, USE  STRIP TO CHECK GLUCOSE TWICE DAILY AS DIRECTEDUSE  STRIP TO CHECK GLUCOSE TWICE DAILY AS DIRECTED, Disp: 200 each, Rfl: 3   Insulin  Glargine (BASAGLAR  KWIKPEN) 100 UNIT/ML, Inject 30 Units into the skin daily., Disp: 15 mL, Rfl: 2   Insulin  Pen Needle (BD PEN NEEDLE NANO U/F) 32G X 4 MM MISC, Use as directed to inject insulin , Disp: 100 each, Rfl: 3   ipratropium-albuterol  (DUONEB) 0.5-2.5 (3) MG/3ML SOLN, Take 3 mLs by nebulization every 6 (six) hours as needed (J45.50). Dx J45.50, Disp: 360 mL, Rfl: 3   metFORMIN  (GLUCOPHAGE ) 1000 MG tablet, Take 1 tablet (1,000 mg total) by mouth 2 (two) times daily with a meal., Disp: 180 tablet, Rfl: 1   montelukast  (SINGULAIR ) 10 MG tablet, Take 1 tablet (10 mg total) by mouth at bedtime., Disp: 90 tablet, Rfl: 3   rosuvastatin  (CRESTOR ) 5 MG tablet, TAKE 1 TABLET(5 MG) BY MOUTH DAILY, Disp: 90 tablet, Rfl: 2   sitaGLIPtin  (JANUVIA ) 100 MG tablet, TAKE 1 TABLET(100 MG) BY MOUTH DAILY, Disp: 30 tablet, Rfl: 0   Allergies:  Patient has no known allergies.  BP 136/60 (BP Location: Left Arm, Patient Position: Sitting, Cuff Size: Normal)   Pulse 85   Temp 97.6 F (36.4 C) (Temporal)   Ht 5' 1 (1.549 m)   Wt 152 lb 6.4 oz (69.1 kg)   SpO2 97%   BMI 28.80 kg/m     Review of Systems: Gen:  Denies  fever, sweats, chills weight loss  HEENT: Denies blurred vision, double vision, ear pain, eye pain, hearing loss, nose bleeds,  sore throat Cardiac:  No dizziness, chest pain or heaviness, chest tightness,edema, No JVD Resp:   No cough, -sputum production, +shortness of breath,-wheezing, -hemoptysis,  Other:  All other systems negative   Physical Examination:   General Appearance: No distress  EYES PERRLA, EOM intact.   NECK Supple, No JVD Pulmonary: normal breath sounds, No wheezing.  CardiovascularNormal S1,S2.  No m/r/g.   Abdomen: Benign, Soft, non-tender. Neurology UE/LE 5/5 strength, no focal deficits Ext 2+ lower extremity edema ALL OTHER ROS ARE NEGATIVE   Assessment and Plan:  78 year old Indian female with chronic severe persistent asthma currently on Pulmicort  and Brovana  nebulizers  She has respiratory insufficiency and unable to take traditional inhaler therapy for chronic severe persistent asthma in the setting of coronary artery disease , patient has signs symptoms of obstructive sleep apnea recommend home sleep study to assess for sleep apnea  Excessive daytime sleepiness signs of OSA Recommend home sleep test Epworth sleep score was 12  Chronic severe persistent asthma  Continue nebulized therapy as prescribed  Pulmicort  twice daily and Brovana  twice daily DuoNebs as needed  shortness of breath Multifactorial etiologies which include obesity underlying lung disease and undiagnosed CAD with diastolic heart failure Follow-up with cardiology as needed It seems that the overall condition has somewhat improved with the initiation of bronchodilator therapy  No exacerbation at this time No evidence of heart failure at this time No evidence or signs of infection at this time No respiratory distress No fevers, chills, nausea, vomiting, diarrhea No evidence of lower extremity edema No evidence hemoptysis  Coronary artery disease with signs of diastolic heart failure Patient refuses to be seen by cardiology It was recommended that she obtain a stress test and further cardiac  evaluation Family is well aware of her decisions to not see cardiology Patient can go into cardiac arrest at any moment   MEDICATION ADJUSTMENTS/LABS AND TESTS ORDERED: Continue nebulized therapy as maintenance therapy Continue Pulmicort  nebs twice daily Continue Brovana  nebs twice daily DuoNebs as needed Home sleep test to assess for sleep apnea   CURRENT MEDICATIONS REVIEWED AT LENGTH WITH PATIENT TODAY   Patient  satisfied with Plan of action and management. All questions answered   Follow up  4 weeks    I spent a total of 47  minutes reviewing chart data, face-to-face evaluation with the patient, counseling and coordination of care as detailed above.      Nickolas Alm Cellar, M.D.  [facility]  Medical Director [facility] Medical Director [facility] Cardio-Pulmonary Department

## 2023-07-17 NOTE — Patient Instructions (Addendum)
 Pulmicort  nebulizer 2 times per day Brovana   2 times per day DOUNEBS NEBULIZER every 4 hrs as needed  Plan to test for sleep apnea with home sleep study   Avoid Allergens and Irritants Avoid secondhand smoke Avoid SICK contacts Recommend  Masking  when appropriate Recommend Keep up-to-date with vaccinations

## 2023-07-19 ENCOUNTER — Other Ambulatory Visit (HOSPITAL_COMMUNITY): Payer: Self-pay

## 2023-07-19 ENCOUNTER — Ambulatory Visit: Payer: Medicare Other | Admitting: Family Medicine

## 2023-07-19 ENCOUNTER — Telehealth: Payer: Self-pay | Admitting: Family Medicine

## 2023-07-19 NOTE — Telephone Encounter (Signed)
 Tereasa Shope calling from [facility] PT. Calling to report patient had a missed visit bc pt is having difficulty breathing. Daughter reporting pt is out of rescue inhaler and reporting that the patient is ok if she sits down. Daughter is reporting the [facility] on [address] is out of the medication. CB- [phone]

## 2023-07-24 ENCOUNTER — Telehealth: Payer: Self-pay

## 2023-07-24 ENCOUNTER — Other Ambulatory Visit (HOSPITAL_COMMUNITY): Payer: Self-pay

## 2023-07-24 NOTE — Telephone Encounter (Signed)
Pt daughter reports no longer needed and advised when needing more. Rx is being handled by pulmonolgy

## 2023-07-24 NOTE — Telephone Encounter (Signed)
Pharmacy Patient Advocate Encounter   Received notification from Pt Calls Messages that prior authorization for Ipratropium-Albuterol 0.5-2.5 (3)MG/3ML solution is required/requested.   Insurance verification completed.   The patient is insured through [facility] .   Per test claim: PA required; PA submitted to above mentioned insurance via CoverMyMeds Key/confirmation #/EOC [facility] Status is pending

## 2023-07-24 NOTE — Telephone Encounter (Signed)
Pharmacy Patient Advocate Encounter  Received notification from [facility] that Prior Authorization for Ipratropium-Albuterol 0.5-2.5 (3)MG/3ML solution has been DENIED.  Full denial letter will be uploaded to the media tab. See denial reason below.   PA #/Case ID/Reference #: [id number]  *has to be run through Part B, spoke with [facility]

## 2023-07-25 NOTE — Telephone Encounter (Signed)
So [facility] just needs to run through Part B, not D for patient to be able to get it.

## 2023-07-30 ENCOUNTER — Other Ambulatory Visit: Payer: Self-pay

## 2023-07-30 MED ORDER — IPRATROPIUM-ALBUTEROL 0.5-2.5 (3) MG/3ML IN SOLN: 3.0000 mL | Freq: Three times a day (TID) | RESPIRATORY_TRACT | 11 refills | Status: AC | PRN

## 2023-07-30 NOTE — Progress Notes (Signed)
We received a call from [facility] pharmacy, they need a script for the Duoneb written 3ml every 8 hours as needed. Quantity .  I have sent in the refill.  Nothing further needed.

## 2023-08-03 ENCOUNTER — Other Ambulatory Visit: Payer: Self-pay | Admitting: Family Medicine

## 2023-08-09 ENCOUNTER — Encounter

## 2023-08-09 DIAGNOSIS — G4733 Obstructive sleep apnea (adult) (pediatric): Secondary | ICD-10-CM | POA: Diagnosis not present

## 2023-08-09 DIAGNOSIS — R0689 Other abnormalities of breathing: Secondary | ICD-10-CM

## 2023-08-12 ENCOUNTER — Telehealth: Payer: Self-pay

## 2023-08-12 ENCOUNTER — Telehealth: Payer: Self-pay | Admitting: Family Medicine

## 2023-08-12 ENCOUNTER — Other Ambulatory Visit: Payer: Self-pay | Admitting: Family Medicine

## 2023-08-12 DIAGNOSIS — E1159 Type 2 diabetes mellitus with other circulatory complications: Secondary | ICD-10-CM

## 2023-08-12 MED ORDER — METFORMIN HCL 1000 MG PO TABS
1000.0000 mg | ORAL_TABLET | Freq: Two times a day (BID) | ORAL | 1 refills | Status: DC
Start: 2023-08-12 — End: 2024-01-29

## 2023-08-12 NOTE — Telephone Encounter (Signed)
 Copied from CRM [id number]. Topic: Clinical - Prescription Issue >> Aug 12, 2023 10:29 AM [name] wrote: Reason for CRM: glucose blood (ACCU-CHEK GUIDE) test strip- system shows new order needed, won't allow to submit refill

## 2023-08-12 NOTE — Telephone Encounter (Signed)
 Copied from CRM [id number]. Topic: Clinical - Medication Refill >> Aug 12, 2023 10:25 AM [name] wrote: Most Recent Primary Care Visit:  Provider: Erasmo Downer  Department: [id number]  Visit Type: OFFICE VISIT  Date: 07/11/2023  Medication: metFORMIN (GLUCOPHAGE) 1000 MG tablet glucose blood (ACCU-CHEK GUIDE) test strip sitaGLIPtin (JANUVIA) 100 MG tablet   Has the patient contacted their pharmacy? No (Agent: If no, request that the patient contact the pharmacy for the refill. If patient does not wish to contact the pharmacy document the reason why and proceed with request.) (Agent: If yes, when and what did the pharmacy advise?)  Is this the correct pharmacy for this prescription? Yes If no, delete pharmacy and type the correct one.  This is the patient's preferred pharmacy:  [facility] [address], [place] - [address] [place] [address] Phone: [phone] Fax: [phone]      Has the prescription been filled recently? Yes  Is the patient out of the medication? Yes  Has the patient been seen for an appointment in the last year OR does the patient have an upcoming appointment? Yes  Can we respond through MyChart? Yes  Agent: Please be advised that Rx refills may take up to 3 business days. We ask that you follow-up with your pharmacy.

## 2023-08-12 NOTE — Telephone Encounter (Signed)
 Duplicate encounter

## 2023-08-12 NOTE — Telephone Encounter (Signed)
  The medications or procedures for these orders are no longer available for ordering. Place new orders instead.  glucose blood (ACCU-CHEK GUIDE) test strip

## 2023-08-12 NOTE — Telephone Encounter (Signed)
[place] faxed refill request for the following medications:  glucose blood (ACCU-CHEK GUIDE) test strip   Please advise.

## 2023-08-12 NOTE — Telephone Encounter (Signed)
 Copied from CRM [id number]. Topic: Clinical - Prescription Issue >> Aug 12, 2023 10:21 AM [name] wrote: Reason for CRM: JARDIANCE 25 MG TABS tablet- need this sent to [facility], was sent to [facility].

## 2023-08-13 ENCOUNTER — Other Ambulatory Visit: Payer: Self-pay

## 2023-08-13 DIAGNOSIS — E1159 Type 2 diabetes mellitus with other circulatory complications: Secondary | ICD-10-CM

## 2023-08-13 MED ORDER — BLOOD GLUCOSE TEST STRIPS 333 VI STRP: ORAL_STRIP | 11 refills | Status: DC

## 2023-08-13 MED ORDER — EMPAGLIFLOZIN 25 MG PO TABS: 25.0000 mg | ORAL_TABLET | Freq: Every day | ORAL | 1 refills | Status: DC

## 2023-08-13 NOTE — Telephone Encounter (Signed)
 Rx sent

## 2023-08-14 ENCOUNTER — Ambulatory Visit: Payer: Medicare Other | Admitting: Internal Medicine

## 2023-08-15 ENCOUNTER — Other Ambulatory Visit: Payer: Self-pay | Admitting: Family Medicine

## 2023-08-15 ENCOUNTER — Ambulatory Visit: Payer: Medicare Other | Admitting: Internal Medicine

## 2023-08-15 ENCOUNTER — Encounter: Payer: Self-pay | Admitting: Internal Medicine

## 2023-08-15 VITALS — BP 140/60 | HR 90 | Temp 98.1°F | Ht 61.0 in | Wt 155.0 lb

## 2023-08-15 DIAGNOSIS — G4710 Hypersomnia, unspecified: Secondary | ICD-10-CM | POA: Diagnosis not present

## 2023-08-15 DIAGNOSIS — J4550 Severe persistent asthma, uncomplicated: Secondary | ICD-10-CM | POA: Diagnosis not present

## 2023-08-15 DIAGNOSIS — I5032 Chronic diastolic (congestive) heart failure: Secondary | ICD-10-CM

## 2023-08-15 DIAGNOSIS — J4540 Moderate persistent asthma, uncomplicated: Secondary | ICD-10-CM

## 2023-08-15 DIAGNOSIS — G4733 Obstructive sleep apnea (adult) (pediatric): Secondary | ICD-10-CM

## 2023-08-15 NOTE — Progress Notes (Signed)
 [facility] [place] [facility]  Date: 08/15/2023  MRN# [id number] Whitney Fleming 10/08/1945  Whitney Fleming is a 78 y.o. old female seen in follow up for chief complaint of dyspnea.   SYNOPSIS Whitney Fleming is a 78 y.o. female  with severe allergic asthma.  She speaks [place] her daughter translates.  She has had 3 admissions to the hospital in 2019 with asthma exacerbations including most recently in September 2019.  She had not had further hospital admissions thus far this year but required 2 courses of prednisone in May 2020 which caused her blood sugar to spike.   She previously had been evaluated for an injectable biologic agent for asthma but declined because she did not want injections. She had started MAINTENANCE INHALED NEB THERAPY  We previously considered her for Nucala but PATIENT declined anything that involve injections.    **IgE 12/24/17; IGe 5.  **CXR 11/05/17; changes of chronic bronchitis, right heart enlargement.  **CBC 02/11/18>> absolute eosinophil count 800. **CBC 09/25/17; Abs eos count 300.    CC Follow-up assessment for asthma ASSESSMENT OF EXCESSIVE DAYTIME SLEEPINESS   HPI Chronic shortness of breath and dyspnea on exertion It seems that there is significant improvement of her symptoms with maintenance of nebulized therapy Respiratory insufficiency-patient could no longer use traditional inhaler therapy  Most of her history is provided by the daughter Previous office visit assessment for overnight pulse oximetry was within normal limits Patient did not meet criteria for oxygen therapy  No exacerbation at this time No evidence of heart failure at this time No evidence or signs of infection at this time No respiratory distress No fevers, chills, nausea, vomiting, diarrhea No evidence of lower extremity edema No evidence hemoptysis  Patient recently traveled to [place] While in [place] patient did not use her nebulizer for few days and was admitted to the  hospital Patient recovered with nebulizers and CPAP therapy  Sleep study reviewed in detail with patient patient has severe sleep apnea with AHI of 39 HST confirms severe sleep apnea diagnosis I have explained the option of starting CPAP therapy with nasal mask   Discussed sleep data and reviewed with patient.  Encouraged proper weight management.  Discussed driving precautions and its relationship with hypersomnolence.  Discussed operating dangerous equipment and its relationship with hypersomnolence.  Discussed sleep hygiene, and benefits of a fixed sleep waked time.  The importance of getting eight or more hours of sleep discussed with patient.  Discussed limiting the use of the computer and television before bedtime.  Decrease naps during the day, so night time sleep will become enhanced.  Limit caffeine, and sleep deprivation.  HTN, stroke, and heart failure are potential risk factors.       Medication:    Current Outpatient Medications:    Accu-Chek FastClix Lancets MISC, Use as directed to check blood glucose twice daily, Disp: 100 each, Rfl: 1   AMBULATORY NON FORMULARY MEDICATION, Medication Name: Aero chamber use as directed. DX:J45.909, Disp: 1 each, Rfl: 0   amLODipine (NORVASC) 5 MG tablet, Take 1 tablet (5 mg total) by mouth daily., Disp: 90 tablet, Rfl: 1   arformoterol (BROVANA) 15 MCG/2ML NEBU, Take 2 mLs (15 mcg total) by nebulization in the morning and at bedtime., Disp: 360 mL, Rfl: 5   aspirin EC 81 MG tablet, Take 1 tablet (81 mg total) by mouth daily. Swallow whole., Disp: 90 tablet, Rfl: 3   budesonide (PULMICORT) 0.5 MG/2ML nebulizer solution, Take 2 mLs (0.5 mg total) by nebulization  in the morning and at bedtime., Disp: 360 mL, Rfl: 3   budesonide-formoterol (SYMBICORT) 160-4.5 MCG/ACT inhaler, Inhale 3 puffs into the lungs 2 (two) times daily., Disp: 30.6 g, Rfl: 0   carvedilol (COREG) 12.5 MG tablet, TAKE 1 TABLET(12.5 MG) BY MOUTH TWICE DAILY WITH A  MEAL, Disp: 180 tablet, Rfl: 1   empagliflozin (JARDIANCE) 25 MG TABS tablet, Take 1 tablet (25 mg total) by mouth daily. TAKE 1 TABLET(25 MG) BY MOUTH DAILY, Disp: 90 tablet, Rfl: 1   famotidine (PEPCID) 20 MG tablet, Take 1 tablet (20 mg total) by mouth 2 (two) times daily., Disp: 180 tablet, Rfl: 1   glucose blood (ACCU-CHEK GUIDE) test strip, USE  STRIP TO CHECK GLUCOSE TWICE DAILY AS DIRECTEDUSE  STRIP TO CHECK GLUCOSE TWICE DAILY AS DIRECTED, Disp: 200 each, Rfl: 3   Glucose Blood (BLOOD GLUCOSE TEST STRIPS 333) STRP, Use as directed to check blood glucose three to four times daily, Disp: 100 strip, Rfl: 11   Insulin Glargine (BASAGLAR KWIKPEN) 100 UNIT/ML, Inject 30 Units into the skin daily., Disp: 15 mL, Rfl: 2   Insulin Pen Needle (BD PEN NEEDLE NANO U/F) 32G X 4 MM MISC, Use as directed to inject insulin, Disp: 100 each, Rfl: 3   ipratropium-albuterol (DUONEB) 0.5-2.5 (3) MG/3ML SOLN, Take 3 mLs by nebulization every 8 (eight) hours as needed., Disp: 270 mL, Rfl: 11   metFORMIN (GLUCOPHAGE) 1000 MG tablet, Take 1 tablet (1,000 mg total) by mouth 2 (two) times daily with a meal., Disp: 180 tablet, Rfl: 1   montelukast (SINGULAIR) 10 MG tablet, Take 1 tablet (10 mg total) by mouth at bedtime., Disp: 90 tablet, Rfl: 3   rosuvastatin (CRESTOR) 5 MG tablet, TAKE 1 TABLET(5 MG) BY MOUTH DAILY, Disp: 90 tablet, Rfl: 2   sitaGLIPtin (JANUVIA) 100 MG tablet, TAKE 1 TABLET(100 MG) BY MOUTH DAILY, Disp: 30 tablet, Rfl: 0   Allergies:  Patient has no known allergies.   BP (!) 140/60 (BP Location: Left Arm, Patient Position: Sitting, Cuff Size: Normal)   Pulse 90   Temp 98.1 F (36.7 C) (Temporal)   Ht 5\' 1"  (1.549 m)   Wt 155 lb (70.3 kg)   SpO2 96%   BMI 29.29 kg/m       Review of Systems: Gen:  Denies  fever, sweats, chills weight loss  HEENT: Denies blurred vision, double vision, ear pain, eye pain, hearing loss, nose bleeds, sore throat Cardiac:  No dizziness, chest pain or  heaviness, chest tightness,edema, No JVD Resp:   No cough, -sputum production, -shortness of breath,-wheezing, -hemoptysis,  Other:  All other systems negative   Physical Examination:   General Appearance: No distress  EYES PERRLA, EOM intact.   NECK Supple, No JVD Pulmonary: normal breath sounds, No wheezing.  CardiovascularNormal S1,S2.  No m/r/g.   Abdomen: Benign, Soft, non-tender. Neurology UE/LE 5/5 strength, no focal deficits Ext pulses intact, cap refill intact ALL OTHER ROS ARE NEGATIVE    Assessment and Plan:  78 year old female with chronic severe persistent asthma currently on Pulmicort and Brovana nebulizers with respiratory insufficiency unable to take traditional inhaler therapy for chronic severe persistent asthma in the setting of coronary artery disease and diastolic heart failure with a new diagnosis of severe sleep apnea    Excessive daytime sleepiness signs of OSA Severe OSA with AHI of 40 Referral to DME company [employer] MASK Start AUTO CPAP 4-12 cm h20  Chronic severe persistent asthma  Continue nebulized therapy  as prescribed  Pulmicort twice daily and Brovana twice daily DuoNebs as needed  shortness of breath Multifactorial etiologies which include obesity underlying lung disease and undiagnosed CAD with diastolic heart failure  Follow-up with cardiology as needed It seems that the overall condition has somewhat improved with the initiation of bronchodilator therapy  No exacerbation at this time No evidence of heart failure at this time No evidence or signs of infection at this time No respiratory distress No fevers, chills, nausea, vomiting, diarrhea No evidence of lower extremity edema No evidence hemoptysis   Coronary artery disease with signs of diastolic heart failure Patient refuses to be seen by cardiology It was recommended that she obtain a stress test and further cardiac evaluation Family is well aware of her  decisions to not see cardiology Patient can go into cardiac arrest at any moment   MEDICATION ADJUSTMENTS/LABS AND TESTS ORDERED: Continue nebulized therapy as maintenance therapy Continue Pulmicort nebs twice daily Continue Brovana nebs twice daily DuoNebs as needed Referral to DME company [employer] MASK AUTO CPAP 4-12 cm h20   CURRENT MEDICATIONS REVIEWED AT LENGTH WITH PATIENT TODAY   Patient  satisfied with Plan of action and management. All questions answered   Follow up  4 weeks    I spent a total of 47  minutes reviewing chart data, face-to-face evaluation with the patient, counseling and coordination of care as detailed above.      Lucie Leather, M.D.  [facility]  Medical Director [facility] Medical Director [facility] Cardio-Pulmonary Department

## 2023-08-15 NOTE — Patient Instructions (Signed)
 Referral to DME company [employer] MASK AUTO CPAP 4-12 cm h20   Avoid Allergens and Irritants Avoid secondhand smoke Avoid SICK contacts Recommend  Masking  when appropriate Recommend Keep up-to-date with vaccinations

## 2023-08-15 NOTE — Telephone Encounter (Signed)
 Copied from CRM [id number]. Topic: Clinical - Medication Refill >> Aug 15, 2023  4:10 PM Alessandra Bevels wrote: Most Recent Primary Care Visit:  Provider: Erasmo Downer  Department: [facility]  Visit Type: OFFICE VISIT  Date: 07/11/2023  Medication: sitaGLIPtin (JANUVIA) 100 MG tablet [[id number]]  glucose blood (ACCU-CHEK GUIDE) test strip [[id number]]  Has the patient contacted their pharmacy? Yes (Agent: If no, request that the patient contact the pharmacy for the refill. If patient does not wish to contact the pharmacy document the reason why and proceed with request.) (Agent: If yes, when and what did the pharmacy advise?)  Is this the correct pharmacy for this prescription? Yes If no, delete pharmacy and type the correct one.  This is the patient's preferred pharmacy:  [facility] [address] [place] - [address] [place] [address] Phone: [phone] Fax: [phone]  [facility][id number] [place], [place] - [address] AT [facility] [address] [place] [address] Phone: [phone] Fax: [phone]  [id number] - [place], [place] - [address] AT [facility] [address] [facility] [address] [place] [address] Phone: [phone] Fax: [phone]   Has the prescription been filled recently? Yes  Is the patient out of the medication? Yes for the last 2 days  Has the patient been seen for an appointment in the last year OR does the patient have an upcoming appointment? Yes  Can we respond through MyChart? Yes  Agent: Please be advised that Rx refills may take up to 3 business days. We ask that you follow-up with your pharmacy.

## 2023-08-16 MED ORDER — SITAGLIPTIN PHOSPHATE 100 MG PO TABS: 100.0000 mg | ORAL_TABLET | Freq: Every day | ORAL | 0 refills | Status: DC

## 2023-08-16 NOTE — Telephone Encounter (Signed)
 Pt. Has an appointment. Requested Prescriptions  Pending Prescriptions Disp Refills   sitaGLIPtin (JANUVIA) 100 MG tablet 30 tablet 0    Sig: Take 1 tablet (100 mg total) by mouth daily.     Endocrinology:  Diabetes - DPP-4 Inhibitors Failed - 08/16/2023 11:26 AM      Failed - HBA1C is between 0 and 7.9 and within 180 days    Hemoglobin A1C  Date Value Ref Range Status  12/25/2012 7.9 (H) 4.2 - 6.3 % Final    Comment:    The American Diabetes Association recommends that a primary goal of therapy should be <7% and that physicians should reevaluate the treatment regimen in patients with HbA1c values consistently >8%.    Hgb A1c MFr Bld  Date Value Ref Range Status  05/28/2023 10.2 (H) 4.8 - 5.6 % Final    Comment:             Prediabetes: 5.7 - 6.4          Diabetes: >6.4          Glycemic control for adults with diabetes: <7.0          Failed - Cr in normal range and within 360 days    Creatinine  Date Value Ref Range Status  12/26/2012 1.16 0.60 - 1.30 mg/dL Final   Creatinine, Ser  Date Value Ref Range Status  05/28/2023 1.20 (H) 0.57 - 1.00 mg/dL Final         Passed - Valid encounter within last 6 months    Recent Outpatient Visits           1 month ago Type 2 diabetes mellitus with other circulatory complication, without long-term current use of insulin (HCC)   [place] [facility] [place], Marzella Schlein, MD   2 months ago Hypertension associated with diabetes [facility])   [facility] [place], Marzella Schlein, MD   7 months ago [employer] [facility] [employer] preventive visit   [place] [facility] [place], Marzella Schlein, MD   11 months ago Type 2 diabetes mellitus with other circulatory complication, without long-term current use of insulin [facility])   [place] [place], Marzella Schlein, MD   1 year ago Type 2 diabetes mellitus with other circulatory complication, without long-term current use of insulin  [facility])   [place] [facility] [place], Marzella Schlein, MD       Future Appointments             In 3 months Erin Fulling, MD [facility] [place]   In 4 months Bacigalupo, Marzella Schlein, MD [facility], [facility]

## 2023-08-26 ENCOUNTER — Telehealth: Payer: Self-pay | Admitting: Family Medicine

## 2023-08-26 NOTE — Telephone Encounter (Signed)
 [facility] pharmacy is requesting refill Insulin Glargine (BASAGLAR KWIKPEN) 100 UNIT/ML   Please advise

## 2023-08-26 NOTE — Telephone Encounter (Signed)
 Refill requested too soon.

## 2023-09-02 ENCOUNTER — Other Ambulatory Visit: Payer: Self-pay | Admitting: Internal Medicine

## 2023-09-02 ENCOUNTER — Telehealth: Payer: Self-pay | Admitting: Internal Medicine

## 2023-09-02 DIAGNOSIS — G4733 Obstructive sleep apnea (adult) (pediatric): Secondary | ICD-10-CM

## 2023-09-02 NOTE — Telephone Encounter (Signed)
 You order Cpap machine for this patient on 08/15/23. We received a note from [employer] stating they can't process the patients order with HST because of her insurance which is [place] [employer]. They will not approve of PAP device with home sleep study. Patient will need to have in lab sleep study

## 2023-09-02 NOTE — Telephone Encounter (Signed)
 I sent a message to [employer] since the patient has Medicare A & B and Medicaid I received a response from Skene with [employer] I do not show any notes saying we can not process this order. We have both insruances on file and patient is scheduled for setup on 09-18-23 @ 1030am at our [place] location.  We were awaiting a CNM for supplies but that has been received.   I have placed the notes in the account just in case. New, Tiajuana Amass, Rodney Langton; Kathe Becton; Randie Heinz, [place]; 1 other Hello Synetta Fail, Everything looks to be good to go on this account. I see no mention of anything needed at this time. Patient has passed review and Patient is schedule for cpap setup.

## 2023-09-04 ENCOUNTER — Other Ambulatory Visit: Payer: Self-pay | Admitting: Internal Medicine

## 2023-09-04 DIAGNOSIS — J4550 Severe persistent asthma, uncomplicated: Secondary | ICD-10-CM

## 2023-09-05 ENCOUNTER — Telehealth: Payer: Self-pay | Admitting: Family Medicine

## 2023-09-05 ENCOUNTER — Other Ambulatory Visit: Payer: Self-pay | Admitting: Physician Assistant

## 2023-09-05 MED ORDER — MONTELUKAST SODIUM 10 MG PO TABS: 10.0000 mg | ORAL_TABLET | Freq: Every day | ORAL | 3 refills | Status: AC

## 2023-09-05 NOTE — Telephone Encounter (Signed)
[facility] faxed refill request for the following medications:   montelukast (SINGULAIR) 10 MG tablet   Please advise

## 2023-09-06 NOTE — Telephone Encounter (Signed)
 Requested Prescriptions  Pending Prescriptions Disp Refills   carvedilol (COREG) 12.5 MG tablet [Pharmacy Med Name: CARVEDILOL 12.5MG  TABLETS] 180 tablet 0    Sig: TAKE 1 TABLET(12.5 MG) BY MOUTH TWICE DAILY WITH A MEAL     Cardiovascular: Beta Blockers 3 Failed - 09/06/2023  1:27 PM      Failed - Cr in normal range and within 360 days    Creatinine  Date Value Ref Range Status  12/26/2012 1.16 0.60 - 1.30 mg/dL Final   Creatinine, Ser  Date Value Ref Range Status  05/28/2023 1.20 (H) 0.57 - 1.00 mg/dL Final         Failed - Last BP in normal range    BP Readings from Last 1 Encounters:  08/15/23 (!) 140/60         Failed - Valid encounter within last 6 months    Recent Outpatient Visits   None     Future Appointments             In 3 months Bacigalupo, Marzella Schlein, MD [facility]            Passed - AST in normal range and within 360 days    AST  Date Value Ref Range Status  05/28/2023 10 0 - 40 IU/L Final         Passed - ALT in normal range and within 360 days    ALT  Date Value Ref Range Status  05/28/2023 8 0 - 32 IU/L Final         Passed - Last Heart Rate in normal range    Pulse Readings from Last 1 Encounters:  08/15/23 90

## 2023-09-19 ENCOUNTER — Other Ambulatory Visit: Payer: Self-pay | Admitting: Family Medicine

## 2023-09-19 NOTE — Telephone Encounter (Signed)
 Copied from CRM [id number]. Topic: Clinical - Medication Refill >> Sep 19, 2023 11:06 AM Deaijah H wrote: Most Recent Primary Care Visit:  Provider: Erasmo Downer  Department: [id number]  Visit Type: OFFICE VISIT  Date: 07/11/2023  Medication: sitaGLIPtin (JANUVIA) 100 MG tablet (90 day supply)  Has the patient contacted their pharmacy? No (Agent: If no, request that the patient contact the pharmacy for the refill. If patient does not wish to contact the pharmacy document the reason why and proceed with request.) (Agent: If yes, when and what did the pharmacy advise?)  Is this the correct pharmacy for this prescription? Yes If no, delete pharmacy and type the correct one.  This is the patient's preferred pharmacy:  [facility] #[id number] [place], [place] - [address] AT [facility] OF [address] [place] [address] Phone: [phone] Fax: [phone]   Has the prescription been filled recently? Yes  Is the patient out of the medication? Yes  Has the patient been seen for an appointment in the last year OR does the patient have an upcoming appointment? Yes  Can we respond through MyChart? Yes  Agent: Please be advised that Rx refills may take up to 3 business days. We ask that you follow-up with your pharmacy.

## 2023-09-20 MED ORDER — SITAGLIPTIN PHOSPHATE 100 MG PO TABS: 100.0000 mg | ORAL_TABLET | Freq: Every day | ORAL | 1 refills | Status: DC

## 2023-09-20 NOTE — Telephone Encounter (Signed)
 Last OV 07/01/23, advised to F/U for CPE in July. Future OV scheduled.  Requested Prescriptions  Pending Prescriptions Disp Refills   sitaGLIPtin (JANUVIA) 100 MG tablet 90 tablet 1    Sig: Take 1 tablet (100 mg total) by mouth daily.     Endocrinology:  Diabetes - DPP-4 Inhibitors Failed - 09/20/2023  9:44 AM      Failed - HBA1C is between 0 and 7.9 and within 180 days    Hemoglobin A1C  Date Value Ref Range Status  12/25/2012 7.9 (H) 4.2 - 6.3 % Final    Comment:    The American Diabetes Association recommends that a primary goal of therapy should be <7% and that physicians should reevaluate the treatment regimen in patients with HbA1c values consistently >8%.    Hgb A1c MFr Bld  Date Value Ref Range Status  05/28/2023 10.2 (H) 4.8 - 5.6 % Final    Comment:             Prediabetes: 5.7 - 6.4          Diabetes: >6.4          Glycemic control for adults with diabetes: <7.0          Failed - Cr in normal range and within 360 days    Creatinine  Date Value Ref Range Status  12/26/2012 1.16 0.60 - 1.30 mg/dL Final   Creatinine, Ser  Date Value Ref Range Status  05/28/2023 1.20 (H) 0.57 - 1.00 mg/dL Final         Failed - Valid encounter within last 6 months    Recent Outpatient Visits   None     Future Appointments             In 3 months Bacigalupo, Marzella Schlein, MD [facility], [facility]

## 2023-10-02 ENCOUNTER — Encounter: Payer: Self-pay | Admitting: Internal Medicine

## 2023-10-04 MED ORDER — PREDNISONE 10 MG PO TABS: 10.0000 mg | ORAL_TABLET | Freq: Every day | ORAL | 0 refills | Status: DC

## 2023-11-08 ENCOUNTER — Other Ambulatory Visit (HOSPITAL_COMMUNITY): Payer: Self-pay

## 2023-11-08 ENCOUNTER — Telehealth: Payer: Self-pay

## 2023-11-08 NOTE — Telephone Encounter (Signed)
 Pharmacy Patient Advocate Encounter  Received notification from [facility] that Prior Authorization for Ipratropium-Albuterol  0.5-2.5 (3)MG/3ML solution  has been DENIED.  Full denial letter will be uploaded to the media tab. See denial reason below.   PA #/Case ID/Reference #: [id number]   *This medication has to be filled under part B, I have reached out to pharmacy to process.Her copay will be $0.

## 2023-11-08 NOTE — Telephone Encounter (Signed)
 Pharmacy Patient Advocate Encounter   Received notification from Onbase that prior authorization for Ipratropium-Albuterol  0.5-2.5 (3)MG/3ML solution  is required/requested.   Insurance verification completed.   The patient is insured through [facility] [employer] [facility]   Per test claim: PA required; PA submitted to above mentioned insurance via CoverMyMeds Key/confirmation #/EOC [facility] Status is pending

## 2023-12-05 ENCOUNTER — Encounter: Payer: Self-pay | Admitting: Internal Medicine

## 2023-12-05 ENCOUNTER — Ambulatory Visit: Admitting: Internal Medicine

## 2023-12-05 VITALS — BP 120/80 | HR 94 | Temp 98.9°F | Ht 61.0 in | Wt 155.2 lb

## 2023-12-05 DIAGNOSIS — J4550 Severe persistent asthma, uncomplicated: Secondary | ICD-10-CM | POA: Diagnosis not present

## 2023-12-05 DIAGNOSIS — G4733 Obstructive sleep apnea (adult) (pediatric): Secondary | ICD-10-CM | POA: Diagnosis not present

## 2023-12-05 DIAGNOSIS — J4520 Mild intermittent asthma, uncomplicated: Secondary | ICD-10-CM

## 2023-12-05 MED ORDER — PREDNISONE 2.5 MG PO TABS
2.5000 mg | ORAL_TABLET | Freq: Every day | ORAL | 1 refills | Status: DC
Start: 2023-12-05 — End: 2024-01-02

## 2023-12-05 NOTE — Progress Notes (Signed)
 [facility]  Date: 12/05/2023  MRN# [id number] Whitney Fleming June 13, 1945  Whitney Fleming is a 78 y.o. old female seen in follow up for chief complaint of dyspnea.   SYNOPSIS Whitney Fleming is a 78 y.o. female  with severe allergic asthma.  She speaks [place] her daughter translates.  She has had 3 admissions to the hospital in 2019 with asthma exacerbations including most recently in September 2019.  She had not had further hospital admissions thus far this year but required 2 courses of prednisone  in May 2020 which caused her blood sugar to spike.   She previously had been evaluated for an injectable biologic agent for asthma but declined because she did not want injections. She had started MAINTENANCE INHALED NEB THERAPY  We previously considered her for Nucala  but PATIENT declined anything that involve injections.    **IgE 12/24/17; IGe 5.  **CXR 11/05/17; changes of chronic bronchitis, right heart enlargement.  **CBC 02/11/18>> absolute eosinophil count 800. **CBC 09/25/17; Abs eos count 300.    CC Follow-up assessment for asthma Assessment for OSA  HPI Chronic shortness of breath and dyspnea on exertion It seems that there is significant improvement of her symptoms with maintenance of nebulized therapy Respiratory insufficiency-patient could no longer use traditional inhaler therapy  Most of her history is provided by the daughter No exacerbation at this time No evidence of heart failure at this time No evidence or signs of infection at this time No respiratory distress No fevers, chills, nausea, vomiting, diarrhea No evidence of lower extremity edema No evidence hemoptysis'  Sleep study reviewed in detail with patient patient has severe sleep apnea with AHI of 39  Discussed sleep data and reviewed with patient.  Encouraged proper weight management.  Discussed driving precautions and its relationship with hypersomnolence.  Discussed sleep hygiene, and  benefits of a fixed sleep waked time.  The importance of getting eight or more hours of sleep discussed with patient.  Discussed limiting the use of the computer and television before bedtime.  Decrease naps during the day, so night time sleep will become enhanced.  Limit caffeine, and sleep deprivation.   Patient uses and benefits from therapy Using CPAP nightly and with naps Pressure setting is comfortable and is sleeping well. 100% percent compliance AHI reduced to 2.1 AUTO CPAP 4-12  Patient is having angina and exertional chest pain Strong suggestion to  be evaluated by cardiology as soon as possible Patient has refused in the past She has no acute respiratory issues at this time Most of her issues come with walking and she has left-sided chest pain  Medication:    Current Outpatient Medications:    Accu-Chek FastClix Lancets MISC, Use as directed to check blood glucose twice daily, Disp: 100 each, Rfl: 1   AMBULATORY NON FORMULARY MEDICATION, Medication Name: Aero chamber use as directed. DX:J45.909, Disp: 1 each, Rfl: 0   amLODipine  (NORVASC ) 5 MG tablet, Take 1 tablet (5 mg total) by mouth daily., Disp: 90 tablet, Rfl: 1   arformoterol  (BROVANA ) 15 MCG/2ML NEBU, USE 1 VIAL VIA NEBULIZER IN THE MORNING AND AT BEDTIME, Disp: 360 mL, Rfl: 5   aspirin  EC 81 MG tablet, Take 1 tablet (81 mg total) by mouth daily. Swallow whole., Disp: 90 tablet, Rfl: 3   budesonide  (PULMICORT ) 0.5 MG/2ML nebulizer solution, Take 2 mLs (0.5 mg total) by nebulization in the morning and at bedtime., Disp: 360 mL, Rfl: 3   budesonide -formoterol  (SYMBICORT ) 160-4.5 MCG/ACT inhaler, Inhale 3 puffs  into the lungs 2 (two) times daily., Disp: 30.6 g, Rfl: 0   carvedilol  (COREG ) 12.5 MG tablet, TAKE 1 TABLET(12.5 MG) BY MOUTH TWICE DAILY WITH A MEAL, Disp: 180 tablet, Rfl: 0   empagliflozin  (JARDIANCE ) 25 MG TABS tablet, Take 1 tablet (25 mg total) by mouth daily. TAKE 1 TABLET(25 MG) BY MOUTH DAILY, Disp: 90  tablet, Rfl: 1   famotidine  (PEPCID ) 20 MG tablet, Take 1 tablet (20 mg total) by mouth 2 (two) times daily., Disp: 180 tablet, Rfl: 1   glucose blood (ACCU-CHEK GUIDE) test strip, USE  STRIP TO CHECK GLUCOSE TWICE DAILY AS DIRECTEDUSE  STRIP TO CHECK GLUCOSE TWICE DAILY AS DIRECTED, Disp: 200 each, Rfl: 3   Glucose Blood (BLOOD GLUCOSE TEST STRIPS 333) STRP, Use as directed to check blood glucose three to four times daily, Disp: 100 strip, Rfl: 11   Insulin  Glargine (BASAGLAR  KWIKPEN) 100 UNIT/ML, Inject 30 Units into the skin daily., Disp: 15 mL, Rfl: 2   Insulin  Pen Needle (BD PEN NEEDLE NANO U/F) 32G X 4 MM MISC, Use as directed to inject insulin , Disp: 100 each, Rfl: 3   ipratropium-albuterol  (DUONEB) 0.5-2.5 (3) MG/3ML SOLN, Take 3 mLs by nebulization every 8 (eight) hours as needed., Disp: 270 mL, Rfl: 11   metFORMIN  (GLUCOPHAGE ) 1000 MG tablet, Take 1 tablet (1,000 mg total) by mouth 2 (two) times daily with a meal., Disp: 180 tablet, Rfl: 1   montelukast  (SINGULAIR ) 10 MG tablet, Take 1 tablet (10 mg total) by mouth at bedtime., Disp: 90 tablet, Rfl: 3   predniSONE  (DELTASONE ) 10 MG tablet, Take 1 tablet (10 mg total) by mouth daily with breakfast., Disp: 7 tablet, Rfl: 0   rosuvastatin  (CRESTOR ) 5 MG tablet, TAKE 1 TABLET(5 MG) BY MOUTH DAILY, Disp: 90 tablet, Rfl: 2   sitaGLIPtin  (JANUVIA ) 100 MG tablet, Take 1 tablet (100 mg total) by mouth daily., Disp: 90 tablet, Rfl: 1   Allergies:  Patient has no known allergies.  BP 120/80 (BP Location: Left Arm, Patient Position: Sitting, Cuff Size: Large)   Pulse 94   Temp 98.9 F (37.2 C) (Oral)   Ht 5' 1 (1.549 m)   Wt 155 lb 3.2 oz (70.4 kg)   SpO2 94%   BMI 29.32 kg/m      Review of Systems: Gen:  Denies  fever, sweats, chills weight loss  HEENT: Denies blurred vision, double vision, ear pain, eye pain, hearing loss, nose bleeds, sore throat Cardiac:  No dizziness, chest pain or heaviness, +chest tightness Resp:   No cough,  -sputum production, +shortness of breath,-wheezing, -hemoptysis,  Other:  All other systems negative   Physical Examination:   General Appearance: No distress  EYES PERRLA, EOM intact.   NECK Supple, No JVD Pulmonary: normal breath sounds, No wheezing.  CardiovascularNormal S1,S2.  No m/r/g.   Abdomen: Benign, Soft, non-tender. Neurology UE/LE 5/5 strength, no focal deficits Ext pulses intact, cap refill intact ALL OTHER ROS ARE NEGATIVE    Assessment and Plan:  78 year old female with chronic severe persistent asthma currently on Pulmicort  and Brovana  nebulizers with respiratory insufficiency unable to take traditional inhaler therapy for chronic severe persistent asthma in the setting of coronary artery disease and diastolic heart failure with a new diagnosis of severe sleep apnea    Excessive daytime sleepiness signs of OSA Severe OSA with AHI of 40  Assessment of OSA Previous AHI 40 Continue CPAP as prescribed  Excellent compliance report Reviewed compliance report in detail with patient Patient  definitely benefits the use of CPAP therapy as prescribed Using CPAP nightly and with naps Pressure setting is comfortable and is sleeping well. CPAP prescription 4-12 AHI reduced to 2  No evidence of acute heart failure at this time No respiratory distress No fevers, chills, nausea, vomiting, diarrhea No evidence hemoptysis  Patient Instructions Continue to use CPAP every night, minimum of 4-6 hours a night.  Change equipment every 30 days or as directed by DME.  Wash your tubing with warm soap and water daily, hang to dry. Wash humidifier portion weekly. Use bottled, distilled water and change daily   Be aware of reduced alertness and do not drive or operate heavy machinery if experiencing this or drowsiness.  Exercise encouraged, as tolerated. Encouraged proper weight management.  Important to get eight or more hours of sleep  Limiting the use of the computer and  television before bedtime.  Decrease naps during the day, so night time sleep will become enhanced.  Limit caffeine, and sleep deprivation.  HTN, stroke, uncontrolled diabetes and heart failure are potential risk factors.  Risk of untreated sleep apnea including cardiac arrhthymias, stroke, DM, pulm HTN.    Chronic severe persistent asthma -no significant respiratory compromise Continue nebulized therapy as prescribed  Pulmicort  twice daily and Brovana  twice daily DuoNebs as needed  shortness of breath Multifactorial etiologies which include obesity underlying lung disease and undiagnosed CAD with diastolic heart failure Patient with active chest pain recommend cardiology evaluation as soon as possible  Follow-up with cardiology as needed It seems that the overall condition has somewhat improved with the initiation of bronchodilator therapy  Coronary artery disease with signs of diastolic heart failure Patient refuses to be seen by cardiology It was recommended that she obtain a stress test and further cardiac evaluation Family is well aware of her decisions to not see cardiology Patient can go into cardiac arrest at any moment   MEDICATION ADJUSTMENTS/LABS AND TESTS ORDERED: Continue nebulized therapy as maintenance therapy Continue Pulmicort  nebs twice daily Continue Brovana  nebs twice daily DuoNebs as needed Continue CPAP as prescribed Recommend cardiology follow-up soon as possible   CURRENT MEDICATIONS REVIEWED AT LENGTH WITH PATIENT TODAY   Patient  satisfied with Plan of action and management. All questions answered   Follow up 6 months   I spent a total of 45  minutes reviewing chart data, face-to-face evaluation with the patient, counseling and coordination of care as detailed above.      Nickolas Alm Cellar, M.D.  [facility]  Medical Director [facility] Medical Director [facility] Cardio-Pulmonary Department

## 2023-12-05 NOTE — Patient Instructions (Signed)
 Continue nebulizers as prescribed  Recommend cardiology assessment for chest pain  Prednisone  2.5 mg daily as needed  Excellent Job A+ GOLD STAR!!  Continue CPAP as prescribed  Patient Instructions Continue to use CPAP every night, minimum of 4-6 hours a night.  Change equipment every 30 days or as directed by DME.  Wash your tubing with warm soap and water daily, hang to dry. Wash humidifier portion weekly. Use bottled, distilled water and change daily   Be aware of reduced alertness and do not drive or operate heavy machinery if experiencing this or drowsiness.  Exercise encouraged, as tolerated. Encouraged proper weight management.  Important to get eight or more hours of sleep  Limiting the use of the computer and television before bedtime.  Decrease naps during the day, so night time sleep will become enhanced.  Limit caffeine, and sleep deprivation.    Avoid Allergens and Irritants Avoid secondhand smoke Avoid SICK contacts Recommend  Masking  when appropriate Recommend Keep up-to-date with vaccinations

## 2023-12-08 ENCOUNTER — Other Ambulatory Visit: Payer: Self-pay | Admitting: Family Medicine

## 2023-12-10 ENCOUNTER — Other Ambulatory Visit: Payer: Self-pay

## 2023-12-10 DIAGNOSIS — J4550 Severe persistent asthma, uncomplicated: Secondary | ICD-10-CM

## 2023-12-10 MED ORDER — ARFORMOTEROL TARTRATE 15 MCG/2ML IN NEBU: 15.0000 ug | INHALATION_SOLUTION | Freq: Two times a day (BID) | RESPIRATORY_TRACT | 5 refills | Status: DC

## 2023-12-10 NOTE — Telephone Encounter (Signed)
 Requested Prescriptions  Pending Prescriptions Disp Refills   carvedilol  (COREG ) 12.5 MG tablet [Pharmacy Med Name: CARVEDILOL  12.5MG  TABLETS] 180 tablet 0    Sig: TAKE 1 TABLET(12.5 MG) BY MOUTH TWICE DAILY WITH A MEAL     Cardiovascular: Beta Blockers 3 Failed - 12/10/2023 12:42 PM      Failed - Cr in normal range and within 360 days    Creatinine  Date Value Ref Range Status  12/26/2012 1.16 0.60 - 1.30 mg/dL Final   Creatinine, Ser  Date Value Ref Range Status  05/28/2023 1.20 (H) 0.57 - 1.00 mg/dL Final         Failed - Valid encounter within last 6 months    Recent Outpatient Visits   None     Future Appointments             In 1 week Agbor-Etang, Redell, MD [facility] [place]   In 3 weeks Bacigalupo, Jon HERO, MD [facility]            Passed - AST in normal range and within 360 days    AST  Date Value Ref Range Status  05/28/2023 10 0 - 40 IU/L Final         Passed - ALT in normal range and within 360 days    ALT  Date Value Ref Range Status  05/28/2023 8 0 - 32 IU/L Final         Passed - Last BP in normal range    BP Readings from Last 1 Encounters:  12/05/23 120/80         Passed - Last Heart Rate in normal range    Pulse Readings from Last 1 Encounters:  12/05/23 94

## 2023-12-10 NOTE — Progress Notes (Signed)
 Per fax request from [facility].   Refill Brovana .  I have sent in the Brovana .  Nothing further needed.

## 2023-12-20 ENCOUNTER — Ambulatory Visit: Attending: Cardiology | Admitting: Cardiology

## 2023-12-20 ENCOUNTER — Encounter: Payer: Self-pay | Admitting: Cardiology

## 2023-12-20 VITALS — BP 142/62 | HR 85 | Ht 62.0 in | Wt 152.6 lb

## 2023-12-20 DIAGNOSIS — I10 Essential (primary) hypertension: Secondary | ICD-10-CM | POA: Diagnosis present

## 2023-12-20 DIAGNOSIS — I2510 Atherosclerotic heart disease of native coronary artery without angina pectoris: Secondary | ICD-10-CM | POA: Diagnosis present

## 2023-12-20 DIAGNOSIS — R072 Precordial pain: Secondary | ICD-10-CM | POA: Insufficient documentation

## 2023-12-20 DIAGNOSIS — E7800 Pure hypercholesterolemia, unspecified: Secondary | ICD-10-CM | POA: Insufficient documentation

## 2023-12-20 MED ORDER — LOSARTAN POTASSIUM 50 MG PO TABS: 50.0000 mg | ORAL_TABLET | Freq: Every day | ORAL | 3 refills | Status: DC

## 2023-12-20 MED ORDER — PANTOPRAZOLE SODIUM 40 MG PO TBEC: 40.0000 mg | DELAYED_RELEASE_TABLET | Freq: Every day | ORAL | 11 refills | Status: AC

## 2023-12-20 MED ORDER — PANTOPRAZOLE SODIUM 40 MG PO TBEC: 40.0000 mg | DELAYED_RELEASE_TABLET | Freq: Every day | ORAL | 11 refills | Status: DC

## 2023-12-20 NOTE — Patient Instructions (Signed)
 Medication Instructions:  Your physician recommends the following medication changes.  STOP TAKING: Amlodipine   START TAKING: Losartan  50 mg by mouth daily Protonix  40 mg by mouth daily    *If you need a refill on your cardiac medications before your next appointment, please call your pharmacy*  Lab Work: No labs ordered today    Testing/Procedures: Your physician has requested that you have an echocardiogram. Echocardiography is a painless test that uses sound waves to create images of your heart. It provides your doctor with information about the size and shape of your heart and how well your heart's chambers and valves are working.   You may receive an ultrasound enhancing agent through an IV if needed to better visualize your heart during the echo. This procedure takes approximately one hour.  There are no restrictions for this procedure.  This will take place at [address] [facility] [address]) [address], [place] [address]  Please note: We ask at that you not bring children with you during ultrasound (echo/ vascular) testing. Due to room size and safety concerns, children are not allowed in the ultrasound rooms during exams. Our front office staff cannot provide observation of children in our lobby area while testing is being conducted. An adult accompanying a patient to their appointment will only be allowed in the ultrasound room at the discretion of the ultrasound technician under special circumstances. We apologize for any inconvenience.   Follow-Up: At [facility], you and your health needs are our priority.  As part of our continuing mission to provide you with exceptional heart care, our providers are all part of one team.  This team includes your primary Cardiologist (physician) and Advanced Practice Providers or APPs (Physician Assistants and Nurse Practitioners) who all work together to provide you with the care you need, when you need it.  Your next  appointment:   6-8 week(s)  Provider:   Redell Cave, MD

## 2023-12-20 NOTE — Progress Notes (Signed)
 Cardiology Office Note:    Date:  12/20/2023   ID:  Whitney Fleming, DOB [birth year], MRN [id number]  PCP:  Myrla Jon HERO, MD   [facility] Providers Cardiologist:  None     Referring MD: Myrla Jon HERO, MD   No chief complaint on file.   History of Present Illness:    Whitney Fleming is a 78 y.o. female with a hx of CAD(LM, LAD calcifications on chest CT), hypertension, hyperlipidemia, diabetes, morbid obesity, asthma, OSA on CPAP who presents due to chest pain.  Last seen in the office in 2022 with symptoms of shortness of breath.  At the time, echocardiogram was normal, patient declined stress testing or left heart cath.  Had an episode of chest pain while walking to kitchen at home.  Denies any episode since last appointment in 2022.  Has been doing okay since.  Endorses leg edema almost daily, takes Lasix  as needed for this.  Has a history of cough with lisinopril.  BP at home is better controlled with systolics usually less than 130.  Endorses sedentary lifestyle.  Also endorses a burning sensation in the chest.  Feels like heartburn, takes Pepcid  currently.  Prior notes/testing Echo 12/2019 EF 55 to 60%, impaired relaxation.  Past Medical History:  Diagnosis Date   Anemia    normocytic anemia   Asthma    Cardiomegaly    COPD (chronic obstructive pulmonary disease) (HCC)    Diabetes mellitus without complication (HCC)    Hypertension    Hyponatremia     Past Surgical History:  Procedure Laterality Date   TUBAL LIGATION      Current Medications: Current Meds  Medication Sig   [DISCONTINUED] losartan  (COZAAR ) 50 MG tablet Take 1 tablet (50 mg total) by mouth daily.   [DISCONTINUED] pantoprazole  (PROTONIX ) 40 MG tablet Take 1 tablet (40 mg total) by mouth daily.     Allergies:   Patient has no known allergies.   Social History   Socioeconomic History   Marital status: Married    Spouse name: Not on file   Number of children: 4   Years of  education: Not on file   Highest education level: Not on file  Occupational History   Occupation: homemaker  Tobacco Use   Smoking status: Never   Smokeless tobacco: Never  Vaping Use   Vaping status: Never Used  Substance and Sexual Activity   Alcohol use: Never   Drug use: Never   Sexual activity: Not Currently  Other Topics Concern   Not on file  Social History Narrative   Not on file   Social Drivers of Health   Financial Resource Strain: Not on file  Food Insecurity: Not on file  Transportation Needs: Not on file  Physical Activity: Not on file  Stress: Not on file  Social Connections: Not on file     Family History: The patient's family history includes Asthma in her father; Healthy in her mother; Heart disease in her father; Tuberculosis in an other family member. There is no history of Cancer.  ROS:   Please see the history of present illness.     All other systems reviewed and are negative.  EKGs/Labs/Other Studies Reviewed:    The following studies were reviewed today:  EKG Interpretation Date/Time:  Friday December 20 2023 10:51:30 EDT Ventricular Rate:  85 PR Interval:  138 QRS Duration:  68 QT Interval:  366 QTC Calculation: 435 R Axis:   19  Text Interpretation: Sinus rhythm  with Fusion complexes and Premature atrial complexes with Abberant conduction Low voltage QRS Confirmed by Darliss Rogue ([id number]) on 12/20/2023 10:54:18 AM    Recent Labs: 12/31/2022: Hemoglobin 10.4; Platelets 353 05/28/2023: ALT 8; BUN 17; Creatinine, Ser 1.20; Potassium 5.5; Sodium 133  Recent Lipid Panel    Component Value Date/Time   CHOL 135 12/31/2022 1642   CHOL 91 12/25/2012 0154   TRIG 187 (H) 12/31/2022 1642   TRIG 143 12/25/2012 0154   HDL 51 12/31/2022 1642   HDL 26 (L) 12/25/2012 0154   CHOLHDL 2.6 12/31/2022 1642   VLDL 29 12/25/2012 0154   LDLCALC 53 12/31/2022 1642   LDLCALC 36 12/25/2012 0154     Risk Assessment/Calculations:     Physical Exam:     VS:  BP (!) 142/62 (BP Location: Left Arm, Patient Position: Sitting, Cuff Size: Normal)   Pulse 85   Ht 5' 2 (1.575 m)   Wt 152 lb 9.6 oz (69.2 kg)   SpO2 97%   BMI 27.91 kg/m     Wt Readings from Last 3 Encounters:  12/20/23 152 lb 9.6 oz (69.2 kg)  12/05/23 155 lb 3.2 oz (70.4 kg)  08/15/23 155 lb (70.3 kg)     GEN:  Well nourished, well developed, occasional coughing noted HEENT: Normal NECK: No JVD; No carotid bruits CARDIAC: RRR, no murmurs, rubs, gallops RESPIRATORY: Diminished breath sounds, no wheezing ABDOMEN: Soft, non-tender, non-distended MUSCULOSKELETAL:  No edema; chest wall tenderness on palpation. SKIN: Warm and dry NEUROLOGIC:  Alert and oriented x 3 PSYCHIATRIC:  Normal affect   ASSESSMENT:    1. Precordial pain   2. Coronary artery disease involving native heart, unspecified vessel or lesion type, unspecified whether angina present   3. Primary hypertension   4. Pure hypercholesterolemia    PLAN:    In order of problems listed above:  Chest pain, appears atypical, reproducible with palpation suggesting musculoskeletal etiology.  Patient also endorses heartburn sensation suggesting GI etiology.  Start Protonix  40 mg daily, repeat echocardiogram. CAD, (LM, LAD calcifications on chest CT).  Continue aspirin  81 mg daily, Crestor  5 mg daily.  Chest pain appears noncardiac as above.  If symptoms persist, will consider adding Imdur versus left heart cath. Hypertension, BP elevated, better control at home.  Patient is a diabetic, occasional leg edema, baseline cough noted even though not taking ACE inhibitor.  Stop Norvasc , start losartan  50 mg daily, continue Coreg  12.5 mg twice daily. Hyperlipidemia, cholesterol controlled, LDL at goal.  Continue Crestor  5 mg daily.  Follow-up in 6-8 weeks.      Medication Adjustments/Labs and Tests Ordered: Current medicines are reviewed at length with the patient today.  Concerns regarding medicines are outlined  above.  Orders Placed This Encounter  Procedures   EKG 12-Lead   ECHOCARDIOGRAM COMPLETE   Meds ordered this encounter  Medications   DISCONTD: losartan  (COZAAR ) 50 MG tablet    Sig: Take 1 tablet (50 mg total) by mouth daily.    Dispense:  90 tablet    Refill:  3   DISCONTD: pantoprazole  (PROTONIX ) 40 MG tablet    Sig: Take 1 tablet (40 mg total) by mouth daily.    Dispense:  30 tablet    Refill:  11   losartan  (COZAAR ) 50 MG tablet    Sig: Take 1 tablet (50 mg total) by mouth daily.    Dispense:  90 tablet    Refill:  3   pantoprazole  (PROTONIX ) 40 MG tablet  Sig: Take 1 tablet (40 mg total) by mouth daily.    Dispense:  30 tablet    Refill:  11    Patient Instructions  Medication Instructions:  Your physician recommends the following medication changes.  STOP TAKING: Amlodipine   START TAKING: Losartan  50 mg by mouth daily Protonix  40 mg by mouth daily    *If you need a refill on your cardiac medications before your next appointment, please call your pharmacy*  Lab Work: No labs ordered today    Testing/Procedures: Your physician has requested that you have an echocardiogram. Echocardiography is a painless test that uses sound waves to create images of your heart. It provides your doctor with information about the size and shape of your heart and how well your heart's chambers and valves are working.   You may receive an ultrasound enhancing agent through an IV if needed to better visualize your heart during the echo. This procedure takes approximately one hour.  There are no restrictions for this procedure.  This will take place at [address] [facility] [address]) [address], [place] [address]  Please note: We ask at that you not bring children with you during ultrasound (echo/ vascular) testing. Due to room size and safety concerns, children are not allowed in the ultrasound rooms during exams. Our front office staff cannot provide observation of  children in our lobby area while testing is being conducted. An adult accompanying a patient to their appointment will only be allowed in the ultrasound room at the discretion of the ultrasound technician under special circumstances. We apologize for any inconvenience.   Follow-Up: At [facility], you and your health needs are our priority.  As part of our continuing mission to provide you with exceptional heart care, our providers are all part of one team.  This team includes your primary Cardiologist (physician) and Advanced Practice Providers or APPs (Physician Assistants and Nurse Practitioners) who all work together to provide you with the care you need, when you need it.  Your next appointment:   6-8 week(s)  Provider:   Redell Cave, MD       Signed, Redell Cave, MD  12/20/2023 12:32 PM    [facility]

## 2023-12-23 ENCOUNTER — Telehealth: Payer: Self-pay | Admitting: Cardiology

## 2023-12-23 NOTE — Telephone Encounter (Signed)
 Patients daughter called stating that prescriptions were not sent to [facility]. Reviewed in chart that they were [facility] to [facility] on 7/11 and confirmed address with her. She was at pharmacy and checked with them and they do in fact have them. She was appreciative with no further  needs.

## 2023-12-23 NOTE — Telephone Encounter (Signed)
 Pt c/o medication issue:  1. Name of Medication:  famotidine  (PEPCID ) 20 MG tablet  losartan  (COZAAR ) 50 MG tablet pantoprazole  (PROTONIX ) 40 MG tablet   2. How are you currently taking this medication (dosage and times per day)?   3. Are you having a reaction (difficulty breathing--STAT)?   4. What is your medication issue?    Daughter [facility]) stated patient's [id number] [address], [place] - [address]

## 2023-12-24 ENCOUNTER — Other Ambulatory Visit: Payer: Self-pay | Admitting: Family Medicine

## 2023-12-24 NOTE — Telephone Encounter (Unsigned)
 Copied from CRM [id number]. Topic: Clinical - Medication Refill >> Dec 24, 2023  5:44 PM [name] wrote: Medication: sitaGLIPtin  (JANUVIA ) 100 MG tablet  Has the patient contacted their pharmacy? Yes (Agent: If no, request that the patient contact the pharmacy for the refill. If patient does not wish to contact the pharmacy document the reason why and proceed with request.) (Agent: If yes, when and what did the pharmacy advise?)  This is the patient's preferred pharmacy:   [facility] [address], [place] - [address] [place] [address] Phone: [phone] Fax: [phone]  Is this the correct pharmacy for this prescription? Yes If no, delete pharmacy and type the correct one.   Has the prescription been filled recently? Yes  Is the patient out of the medication? Yes  Has the patient been seen for an appointment in the last year OR does the patient have an upcoming appointment? Yes  Can we respond through MyChart? Yes  Agent: Please be advised that Rx refills may take up to 3 business days. We ask that you follow-up with your pharmacy.

## 2023-12-26 NOTE — Telephone Encounter (Signed)
 Requested Prescriptions  Pending Prescriptions Disp Refills   sitaGLIPtin  (JANUVIA ) 100 MG tablet 90 tablet 1    Sig: Take 1 tablet (100 mg total) by mouth daily.     Endocrinology:  Diabetes - DPP-4 Inhibitors Failed - 12/26/2023 11:25 AM      Failed - HBA1C is between 0 and 7.9 and within 180 days    Hemoglobin A1C  Date Value Ref Range Status  12/25/2012 7.9 (H) 4.2 - 6.3 % Final    Comment:    The American Diabetes Association recommends that a primary goal of therapy should be <7% and that physicians should reevaluate the treatment regimen in patients with HbA1c values consistently >8%.    Hgb A1c MFr Bld  Date Value Ref Range Status  05/28/2023 10.2 (H) 4.8 - 5.6 % Final    Comment:             Prediabetes: 5.7 - 6.4          Diabetes: >6.4          Glycemic control for adults with diabetes: <7.0          Failed - Cr in normal range and within 360 days    Creatinine  Date Value Ref Range Status  12/26/2012 1.16 0.60 - 1.30 mg/dL Final   Creatinine, Ser  Date Value Ref Range Status  05/28/2023 1.20 (H) 0.57 - 1.00 mg/dL Final         Failed - Valid encounter within last 6 months    Recent Outpatient Visits   None     Future Appointments             In 1 week Bacigalupo, Jon HERO, MD [facility]   In 1 month Darliss Rogue, MD [facility]

## 2024-01-02 ENCOUNTER — Encounter: Payer: Self-pay | Admitting: Family Medicine

## 2024-01-02 ENCOUNTER — Ambulatory Visit: Payer: Self-pay | Admitting: Family Medicine

## 2024-01-02 VITALS — BP 154/58 | HR 93 | Ht 60.0 in | Wt 155.6 lb

## 2024-01-02 DIAGNOSIS — M7989 Other specified soft tissue disorders: Secondary | ICD-10-CM

## 2024-01-02 DIAGNOSIS — Z0001 Encounter for general adult medical examination with abnormal findings: Secondary | ICD-10-CM

## 2024-01-02 DIAGNOSIS — Z7984 Long term (current) use of oral hypoglycemic drugs: Secondary | ICD-10-CM

## 2024-01-02 DIAGNOSIS — Z0000 Encounter for general adult medical examination without abnormal findings: Secondary | ICD-10-CM

## 2024-01-02 DIAGNOSIS — R5382 Chronic fatigue, unspecified: Secondary | ICD-10-CM

## 2024-01-02 DIAGNOSIS — E1159 Type 2 diabetes mellitus with other circulatory complications: Secondary | ICD-10-CM | POA: Diagnosis not present

## 2024-01-02 DIAGNOSIS — E785 Hyperlipidemia, unspecified: Secondary | ICD-10-CM

## 2024-01-02 DIAGNOSIS — I152 Hypertension secondary to endocrine disorders: Secondary | ICD-10-CM

## 2024-01-02 DIAGNOSIS — E875 Hyperkalemia: Secondary | ICD-10-CM | POA: Diagnosis not present

## 2024-01-02 DIAGNOSIS — E559 Vitamin D deficiency, unspecified: Secondary | ICD-10-CM

## 2024-01-02 DIAGNOSIS — E1169 Type 2 diabetes mellitus with other specified complication: Secondary | ICD-10-CM | POA: Diagnosis not present

## 2024-01-02 MED ORDER — HYDROCHLOROTHIAZIDE 12.5 MG PO TABS: 12.5000 mg | ORAL_TABLET | Freq: Every day | ORAL | 2 refills | Status: DC

## 2024-01-02 MED ORDER — SITAGLIPTIN PHOSPHATE 100 MG PO TABS: 100.0000 mg | ORAL_TABLET | Freq: Every day | ORAL | 1 refills | Status: DC

## 2024-01-02 MED ORDER — VITAMIN D (ERGOCALCIFEROL) 1.25 MG (50000 UNIT) PO CAPS: 50000.0000 [IU] | ORAL_CAPSULE | ORAL | 1 refills | Status: AC

## 2024-01-02 MED ORDER — HYDROCHLOROTHIAZIDE 12.5 MG PO TABS
12.5000 mg | ORAL_TABLET | Freq: Every day | ORAL | 2 refills | Status: AC
Start: 2024-01-02 — End: ?

## 2024-01-02 MED ORDER — VITAMIN D (ERGOCALCIFEROL) 1.25 MG (50000 UNIT) PO CAPS
50000.0000 [IU] | ORAL_CAPSULE | ORAL | 1 refills | Status: DC
Start: 2024-01-02 — End: 2024-01-02

## 2024-01-02 NOTE — Progress Notes (Unsigned)
 Annual Wellness Visit     Patient: Whitney Fleming, Female    DOB: 11-02-1945, 78 y.o.   MRN: [id number] Visit Date: 01/02/2024  Today's Provider: Jon Eva, MD   Chief Complaint  Patient presents with  . Annual Exam    Diet- low fat Exercise- walking  Sleep- Well  Overall- Well   Subjective    Whitney Fleming is a 78 y.o. female who presents today for her Annual Wellness Visit.   Discussed the use of AI scribe software for clinical note transcription with the patient, who gave verbal consent to proceed.  History of Present Illness            {VISON DENTAL STD PSA (Optional):27386}  {History (Optional):23778}   Medications: Outpatient Medications Prior to Visit  Medication Sig  . Accu-Chek FastClix Lancets MISC Use as directed to check blood glucose twice daily  . AMBULATORY NON FORMULARY MEDICATION Medication Name: Aero chamber use as directed. DX:J45.909  . arformoterol  (BROVANA ) 15 MCG/2ML NEBU Take 2 mLs (15 mcg total) by nebulization in the morning and at bedtime.  . aspirin  EC 81 MG tablet Take 1 tablet (81 mg total) by mouth daily. Swallow whole.  . budesonide  (PULMICORT ) 0.5 MG/2ML nebulizer solution Take 2 mLs (0.5 mg total) by nebulization in the morning and at bedtime.  . carvedilol  (COREG ) 12.5 MG tablet TAKE 1 TABLET(12.5 MG) BY MOUTH TWICE DAILY WITH A MEAL  . empagliflozin  (JARDIANCE ) 25 MG TABS tablet Take 1 tablet (25 mg total) by mouth daily. TAKE 1 TABLET(25 MG) BY MOUTH DAILY  . famotidine  (PEPCID ) 20 MG tablet Take 1 tablet (20 mg total) by mouth 2 (two) times daily.  . furosemide  (LASIX ) 20 MG tablet Take 20 mg by mouth daily.  SABRA glucose blood (ACCU-CHEK GUIDE) test strip USE  STRIP TO CHECK GLUCOSE TWICE DAILY AS DIRECTEDUSE  STRIP TO CHECK GLUCOSE TWICE DAILY AS DIRECTED  . Glucose Blood (BLOOD GLUCOSE TEST STRIPS 333) STRP Use as directed to check blood glucose three to four times daily  . Insulin  Glargine (BASAGLAR  KWIKPEN) 100  UNIT/ML Inject 30 Units into the skin daily.  . Insulin  Pen Needle (BD PEN NEEDLE NANO U/F) 32G X 4 MM MISC Use as directed to inject insulin   . ipratropium-albuterol  (DUONEB) 0.5-2.5 (3) MG/3ML SOLN Take 3 mLs by nebulization every 8 (eight) hours as needed.  . JANUVIA  100 MG tablet TAKE 1 TABLET BY MOUTH ONCE DAILY (NEEDS  OFFICE  VISIT  FOR  FURTHER  REFILLS)  . losartan  (COZAAR ) 50 MG tablet Take 1 tablet (50 mg total) by mouth daily.  . metFORMIN  (GLUCOPHAGE ) 1000 MG tablet Take 1 tablet (1,000 mg total) by mouth 2 (two) times daily with a meal.  . montelukast  (SINGULAIR ) 10 MG tablet Take 1 tablet (10 mg total) by mouth at bedtime.  . pantoprazole  (PROTONIX ) 40 MG tablet Take 1 tablet (40 mg total) by mouth daily.  . predniSONE  (DELTASONE ) 10 MG tablet Take 1 tablet (10 mg total) by mouth daily with breakfast.  . rosuvastatin  (CRESTOR ) 5 MG tablet TAKE 1 TABLET(5 MG) BY MOUTH DAILY  . budesonide -formoterol  (SYMBICORT ) 160-4.5 MCG/ACT inhaler Inhale 3 puffs into the lungs 2 (two) times daily. (Patient not taking: Reported on 01/02/2024)  . predniSONE  (DELTASONE ) 2.5 MG tablet Take 1 tablet (2.5 mg total) by mouth daily with breakfast. 10 days (Patient not taking: Reported on 01/02/2024)   No facility-administered medications prior to visit.    No Known Allergies  Patient Care  Team: Myrla Jon HERO, MD as PCP - General (Family Medicine) Alana, Sharyle LABOR, RPH-CPP as Pharmacist Pa, [facility] (Optometry)  Review of Systems  {Insert previous labs (optional):23779} {See past labs  Heme  Chem  Endocrine  Serology  Results Review (optional):1}    Objective    Vitals: BP (!) 154/58 (BP Location: Left Arm, Patient Position: Sitting, Cuff Size: Normal)   Pulse 93   Ht 5' (1.524 m)   Wt 155 lb 9.6 oz (70.6 kg)   SpO2 100%   BMI 30.39 kg/m  {Insert last BP/Wt (optional):23777}{See vitals history (optional):1}    Physical Exam Vitals reviewed.  Constitutional:       General: She is not in acute distress.    Appearance: Normal appearance. She is well-developed. She is not diaphoretic.  HENT:     Head: Normocephalic and atraumatic.     Right Ear: Tympanic membrane, ear canal and external ear normal.     Left Ear: Tympanic membrane, ear canal and external ear normal.     Nose: Nose normal.     Mouth/Throat:     Mouth: Mucous membranes are moist.     Pharynx: Oropharynx is clear. No oropharyngeal exudate.  Eyes:     General: No scleral icterus.    Conjunctiva/sclera: Conjunctivae normal.     Pupils: Pupils are equal, round, and reactive to light.  Neck:     Thyroid : No thyromegaly.  Cardiovascular:     Rate and Rhythm: Normal rate and regular rhythm.     Heart sounds: Normal heart sounds. No murmur heard. Pulmonary:     Effort: Pulmonary effort is normal. No respiratory distress.     Breath sounds: Normal breath sounds. No wheezing or rales.  Abdominal:     General: There is no distension.     Palpations: Abdomen is soft.     Tenderness: There is no abdominal tenderness.  Musculoskeletal:        General: No deformity.     Cervical back: Neck supple.     Right lower leg: No edema.     Left lower leg: No edema.  Lymphadenopathy:     Cervical: No cervical adenopathy.  Skin:    General: Skin is warm and dry.     Findings: No rash.  Neurological:     Mental Status: She is alert and oriented to person, place, and time. Mental status is at baseline.     Gait: Gait normal.  Psychiatric:        Mood and Affect: Mood normal.        Behavior: Behavior normal.        Thought Content: Thought content normal.     Most recent functional status assessment:    01/02/2024    3:56 PM  In your present state of health, do you have any difficulty performing the following activities:  Hearing? 1  Vision? 0  Difficulty concentrating or making decisions? 0  Walking or climbing stairs? 1  Dressing or bathing? 0  Doing errands, shopping? 0  Preparing  Food and eating ? N  Using the Toilet? N  In the past six months, have you accidently leaked urine? N  Do you have problems with loss of bowel control? N  Managing your Medications? N  Managing your Finances? Y  Housekeeping or managing your Housekeeping? Y   Most recent fall risk assessment:    01/02/2024    4:07 PM  Fall Risk   Falls in the past  year? 0  Number falls in past yr: 0  Injury with Fall? 0  Risk for fall due to : No Fall Risks  Follow up Falls evaluation completed    Most recent depression screenings:    01/02/2024    4:07 PM 12/31/2022    3:12 PM  PHQ 2/9 Scores  PHQ - 2 Score 0 0  PHQ- 9 Score 2    Most recent cognitive screening:     No data to display         Most recent Audit-C alcohol use screening    09/17/2022    1:44 PM  Alcohol Use Disorder Test (AUDIT)  1. How often do you have a drink containing alcohol? 0  2. How many drinks containing alcohol do you have on a typical day when you are drinking? 0  3. How often do you have six or more drinks on one occasion? 0  AUDIT-C Score 0   A score of 3 or more in women, and 4 or more in men indicates increased risk for alcohol abuse, EXCEPT if all of the points are from question 1   No results found.  No results found for any visits on 01/02/24.  Assessment & Plan     Annual wellness visit done today including the all of the following: Reviewed patient's Family Medical History Reviewed and updated list of patient's medical providers Assessment of cognitive impairment was done Assessed patient's functional ability Established a written schedule for health screening services Health Risk Assessent Completed and Reviewed  Exercise Activities and Dietary recommendations  Goals     . Pharmacy Goals     Our goal A1c is less than 7%. This corresponds with fasting sugars less than 130 and 2 hour after meal sugars less than 180. Please keep a log of your results when checking your blood sugar   Our  goal bad cholesterol, or LDL, is less than 70. This is why it is important to continue taking your rosuvastatin .  Please have your medications and blood sugar record with you for our next telephone call on 02/04/2023 at 1:45 PM    Thank you!  Sharyle Sia, PharmD, [facility] [phone]         Immunization History  Administered Date(s) Administered  . Fluad Quad(high Dose 65+) 04/27/2021, 05/11/2022  . Fluad Trivalent(High Dose 65+) 05/28/2023  . Influenza, High Dose Seasonal PF 02/25/2018  . PFIZER(Purple Top)SARS-COV-2 Vaccination 07/17/2019, 08/06/2019, 06/01/2020  . Pneumococcal Conjugate-13 04/28/2020  . Pneumococcal Polysaccharide-23 01/14/2018  . Tdap 12/11/2021  . Zoster Recombinant(Shingrix ) 12/11/2021    Health Maintenance  Topic Date Due  . DEXA SCAN  Never done  . Zoster Vaccines- Shingrix  (2 of 2) 02/05/2022  . OPHTHALMOLOGY EXAM  11/16/2022  . COVID-19 Vaccine (4 - 2024-25 season) 02/10/2023  . HEMOGLOBIN A1C  11/26/2023  . Diabetic kidney evaluation - Urine ACR  12/31/2023  . Medicare Annual Wellness (AWV)  12/31/2023  . FOOT EXAM  12/31/2023  . INFLUENZA VACCINE  01/10/2024  . Diabetic kidney evaluation - eGFR measurement  05/27/2024  . DTaP/Tdap/Td (2 - Td or Tdap) 12/12/2031  . Pneumococcal Vaccine: 50+ Years  Completed  . Hepatitis C Screening  Completed  . Hepatitis B Vaccines  Aged Out  . HPV VACCINES  Aged Out  . Meningococcal B Vaccine  Aged Out     Discussed health benefits of physical activity, and encouraged her to engage in regular exercise appropriate for her age and condition.  Problem List Items Addressed This Visit   None               No follow-ups on file.     Jon Eva, MD  [facility] [phone] (phone) [phone] (fax)  [facility]

## 2024-01-28 ENCOUNTER — Ambulatory Visit: Attending: Cardiology

## 2024-01-28 DIAGNOSIS — R072 Precordial pain: Secondary | ICD-10-CM | POA: Diagnosis not present

## 2024-01-28 LAB — ECHOCARDIOGRAM COMPLETE
AR max vel: 2.03 cm2
AV Area VTI: 2.24 cm2
AV Area mean vel: 1.99 cm2
AV Mean grad: 7 mmHg
AV Peak grad: 12.3 mmHg
Ao pk vel: 1.75 m/s
Area-P 1/2: 5.66 cm2
S' Lateral: 2.99 cm

## 2024-01-29 ENCOUNTER — Ambulatory Visit: Payer: Self-pay | Admitting: Cardiology

## 2024-01-29 ENCOUNTER — Other Ambulatory Visit: Payer: Self-pay | Admitting: Family Medicine

## 2024-01-29 DIAGNOSIS — E1165 Type 2 diabetes mellitus with hyperglycemia: Secondary | ICD-10-CM

## 2024-01-29 DIAGNOSIS — E1159 Type 2 diabetes mellitus with other circulatory complications: Secondary | ICD-10-CM

## 2024-02-18 ENCOUNTER — Ambulatory Visit: Attending: Cardiology | Admitting: Cardiology

## 2024-02-18 ENCOUNTER — Encounter: Payer: Self-pay | Admitting: Cardiology

## 2024-02-18 VITALS — BP 126/58 | HR 93 | Ht 61.0 in | Wt 151.6 lb

## 2024-02-18 DIAGNOSIS — I10 Essential (primary) hypertension: Secondary | ICD-10-CM | POA: Diagnosis not present

## 2024-02-18 DIAGNOSIS — R072 Precordial pain: Secondary | ICD-10-CM | POA: Insufficient documentation

## 2024-02-18 DIAGNOSIS — E7800 Pure hypercholesterolemia, unspecified: Secondary | ICD-10-CM | POA: Diagnosis present

## 2024-02-18 DIAGNOSIS — I2510 Atherosclerotic heart disease of native coronary artery without angina pectoris: Secondary | ICD-10-CM | POA: Insufficient documentation

## 2024-02-18 DIAGNOSIS — I351 Nonrheumatic aortic (valve) insufficiency: Secondary | ICD-10-CM | POA: Insufficient documentation

## 2024-02-18 NOTE — Patient Instructions (Signed)
 Medication Instructions:  Your physician recommends that you continue on your current medications as directed. Please refer to the Current Medication list given to you today.   *If you need a refill on your cardiac medications before your next appointment, please call your pharmacy*  Lab Work: No labs ordered today  If you have labs (blood work) drawn today and your tests are completely normal, you will receive your results only by: MyChart Message (if you have MyChart) OR A paper copy in the mail If you have any lab test that is abnormal or we need to change your treatment, we will call you to review the results.  Testing/Procedures: Your physician has requested that you have an echocardiogram in 1 year. Echocardiography is a painless test that uses sound waves to create images of your heart. It provides your doctor with information about the size and shape of your heart and how well your heart's chambers and valves are working.   You may receive an ultrasound enhancing agent through an IV if needed to better visualize your heart during the echo. This procedure takes approximately one hour.  There are no restrictions for this procedure.  This will take place at [address] [facility] [address], [place] [address]  Please note: We ask at that you not bring children with you during ultrasound (echo/ vascular) testing. Due to room size and safety concerns, children are not allowed in the ultrasound rooms during exams. Our front office staff cannot provide observation of children in our lobby area while testing is being conducted. An adult accompanying a patient to their appointment will only be allowed in the ultrasound room at the discretion of the ultrasound technician under special circumstances. We apologize for any inconvenience.   Follow-Up: At [facility], you and your health needs are our priority.  As part of our continuing mission to provide you with exceptional  heart care, our providers are all part of one team.  This team includes your primary Cardiologist (physician) and Advanced Practice Providers or APPs (Physician Assistants and Nurse Practitioners) who all work together to provide you with the care you need, when you need it.  Your next appointment:   1 year(s)  Provider:   You may see Dr. Darliss or one of the following Advanced Practice Providers on your designated Care Team:   Lonni Meager, NP Lesley Maffucci, PA-C Bernardino Bring, PA-C Cadence Wadsworth, PA-C Tylene Lunch, NP Barnie Hila, NP    We recommend signing up for the patient portal called MyChart.  Sign up information is provided on this After Visit Summary.  MyChart is used to connect with patients for Virtual Visits (Telemedicine).  Patients are able to view lab/test results, encounter notes, upcoming appointments, etc.  Non-urgent messages can be sent to your provider as well.   To learn more about what you can do with MyChart, go to [email or web address].

## 2024-02-18 NOTE — Progress Notes (Signed)
 Cardiology Office Note:    Date:  02/18/2024   ID:  Whitney Fleming, DOB [birth year], MRN [id number]  PCP:  Myrla Jon HERO, MD   [facility] Providers Cardiologist:  None     Referring MD: Myrla Jon HERO, MD   Chief Complaint  Patient presents with   Follow-up    follow up ECHO  pt has been doing well with no complaints of chest pain, chest pressure or SOB, medciation reviewed verbally with patient    History of Present Illness:    Whitney Fleming is a 78 y.o. female with a hx of CAD(LM, LAD calcifications on chest CT), hypertension, hyperlipidemia, diabetes, morbid obesity, asthma, OSA on CPAP who presents for follow-up.   Previously seen due to chest pain, likely from reflux.  Started on Protonix  with good effect, denies any further chest pain with starting Protonix .  Echocardiogram obtained to evaluate any significant structural abnormality.  Diagnosed with hyperkalemia and CKD, referral to nephrology placed by primary care physician.  Overall patient states doing well, BP adequately controlled on current medications, denies shortness of breath, palpitations, dizziness, chest pain.   Prior notes/testing Echo 12/2019 EF 55 to 60%, impaired relaxation. Seen in 2022 with shortness of breath, echo was normal, declined stress testing or left heart cath at that time  Past Medical History:  Diagnosis Date   Anemia    normocytic anemia   Asthma    Cardiomegaly    COPD (chronic obstructive pulmonary disease) (HCC)    Diabetes mellitus without complication (HCC)    Hypertension    Hyponatremia     Past Surgical History:  Procedure Laterality Date   TUBAL LIGATION      Current Medications: Current Meds  Medication Sig   Accu-Chek FastClix Lancets MISC Use as directed to check blood glucose twice daily   AMBULATORY NON FORMULARY MEDICATION Medication Name: Aero chamber use as directed. DX:J45.909   arformoterol  (BROVANA ) 15 MCG/2ML NEBU Take 2 mLs (15 mcg  total) by nebulization in the morning and at bedtime.   aspirin  EC 81 MG tablet Take 1 tablet (81 mg total) by mouth daily. Swallow whole.   budesonide  (PULMICORT ) 0.5 MG/2ML nebulizer solution Take 2 mLs (0.5 mg total) by nebulization in the morning and at bedtime.   carvedilol  (COREG ) 12.5 MG tablet TAKE 1 TABLET(12.5 MG) BY MOUTH TWICE DAILY WITH A MEAL   empagliflozin  (JARDIANCE ) 25 MG TABS tablet Take 1 tablet (25 mg total) by mouth daily. TAKE 1 TABLET(25 MG) BY MOUTH DAILY   famotidine  (PEPCID ) 20 MG tablet Take 1 tablet (20 mg total) by mouth 2 (two) times daily.   furosemide  (LASIX ) 20 MG tablet Take 20 mg by mouth daily.   glucose blood (ACCU-CHEK GUIDE) test strip USE  STRIP TO CHECK GLUCOSE TWICE DAILY AS DIRECTEDUSE  STRIP TO CHECK GLUCOSE TWICE DAILY AS DIRECTED   Glucose Blood (BLOOD GLUCOSE TEST STRIPS 333) STRP Use as directed to check blood glucose three to four times daily   hydrochlorothiazide  (HYDRODIURIL ) 12.5 MG tablet Take 1 tablet (12.5 mg total) by mouth daily.   Insulin  Glargine (BASAGLAR  KWIKPEN) 100 UNIT/ML Inject 30 Units into the skin daily.   Insulin  Pen Needle (BD PEN NEEDLE NANO U/F) 32G X 4 MM MISC Use as directed to inject insulin    ipratropium-albuterol  (DUONEB) 0.5-2.5 (3) MG/3ML SOLN Take 3 mLs by nebulization every 8 (eight) hours as needed.   metFORMIN  (GLUCOPHAGE ) 1000 MG tablet TAKE 1 TABLET BY MOUTH TWICE DAILY WITH A MEAL  montelukast  (SINGULAIR ) 10 MG tablet Take 1 tablet (10 mg total) by mouth at bedtime.   pantoprazole  (PROTONIX ) 40 MG tablet Take 1 tablet (40 mg total) by mouth daily.   predniSONE  (DELTASONE ) 10 MG tablet Take 1 tablet (10 mg total) by mouth daily with breakfast.   rosuvastatin  (CRESTOR ) 5 MG tablet TAKE 1 TABLET(5 MG) BY MOUTH DAILY   sitaGLIPtin  (JANUVIA ) 100 MG tablet Take 1 tablet (100 mg total) by mouth daily.   Vitamin D , Ergocalciferol , (DRISDOL ) 1.25 MG (50000 UNIT) CAPS capsule Take 1 capsule (50,000 Units total) by mouth  every 7 (seven) days.     Allergies:   Patient has no known allergies.   Social History   Socioeconomic History   Marital status: Married    Spouse name: Not on file   Number of children: 4   Years of education: Not on file   Highest education level: Not on file  Occupational History   Occupation: homemaker  Tobacco Use   Smoking status: Never   Smokeless tobacco: Never  Vaping Use   Vaping status: Never Used  Substance and Sexual Activity   Alcohol use: Never   Drug use: Never   Sexual activity: Not Currently  Other Topics Concern   Not on file  Social History Narrative   Not on file   Social Drivers of Health   Financial Resource Strain: Medium Risk (01/02/2024)   Overall Financial Resource Strain (CARDIA)    Difficulty of Paying Living Expenses: Somewhat hard  Food Insecurity: No Food Insecurity (01/02/2024)   Hunger Vital Sign    Worried About Running Out of Food in the Last Year: Never true    Ran Out of Food in the Last Year: Never true  Transportation Needs: No Transportation Needs (01/02/2024)   PRAPARE - Administrator, Civil Service (Medical): No    Lack of Transportation (Non-Medical): No  Physical Activity: Inactive (01/02/2024)   Exercise Vital Sign    Days of Exercise per Week: 0 days    Minutes of Exercise per Session: 0 min  Stress: Stress Concern Present (01/02/2024)   [employer] of Occupational Health - Occupational Stress Questionnaire    Feeling of Stress: To some extent  Social Connections: Moderately Isolated (01/02/2024)   Social Connection and Isolation Panel    Frequency of Communication with Friends and Family: More than three times a week    Frequency of Social Gatherings with Friends and Family: Once a week    Attends Religious Services: Never    Database administrator or Organizations: No    Attends Engineer, structural: Never    Marital Status: Married     Family History: The patient's family history  includes Asthma in her father; Healthy in her mother; Heart disease in her father; Tuberculosis in an other family member. There is no history of Cancer.  ROS:   Please see the history of present illness.     All other systems reviewed and are negative.  EKGs/Labs/Other Studies Reviewed:    The following studies were reviewed today:       Recent Labs: 05/28/2023: ALT 8; BUN 17; Creatinine, Ser 1.20; Potassium 5.5; Sodium 133  Recent Lipid Panel    Component Value Date/Time   CHOL 135 12/31/2022 1642   CHOL 91 12/25/2012 0154   TRIG 187 (H) 12/31/2022 1642   TRIG 143 12/25/2012 0154   HDL 51 12/31/2022 1642   HDL 26 (L) 12/25/2012 0154   CHOLHDL  2.6 12/31/2022 1642   VLDL 29 12/25/2012 0154   LDLCALC 53 12/31/2022 1642   LDLCALC 36 12/25/2012 0154     Risk Assessment/Calculations:     Physical Exam:    VS:  BP (!) 126/58 (BP Location: Left Arm, Patient Position: Sitting, Cuff Size: Normal)   Pulse 93   Ht 5' 1 (1.549 m)   Wt 151 lb 9.6 oz (68.8 kg)   SpO2 97%   BMI 28.64 kg/m     Wt Readings from Last 3 Encounters:  02/18/24 151 lb 9.6 oz (68.8 kg)  01/02/24 155 lb 9.6 oz (70.6 kg)  12/20/23 152 lb 9.6 oz (69.2 kg)     GEN:  Well nourished, well developed, occasional coughing noted HEENT: Normal NECK: No JVD; No carotid bruits CARDIAC: RRR, no murmurs, rubs, gallops RESPIRATORY: Diminished breath sounds, no wheezing ABDOMEN: Soft, non-tender, non-distended MUSCULOSKELETAL:  No edema; chest wall tenderness on palpation. SKIN: Warm and dry NEUROLOGIC:  Alert and oriented x 3 PSYCHIATRIC:  Normal affect   ASSESSMENT:    1. Precordial pain   2. Aortic valve insufficiency, etiology of cardiac valve disease unspecified   3. Coronary artery disease involving native heart, unspecified vessel or lesion type, unspecified whether angina present   4. Primary hypertension   5. Pure hypercholesterolemia     PLAN:    In order of problems listed above:  Chest  pain, appears atypical, likely from GERD, resolved with starting Protonix .  Echo 8/25 EF 60 to 65%.  Continue Protonix . Moderate aortic regurgitation.  No edema, no shortness of breath.  Repeat echo in 12 months. CAD, (LM, LAD calcifications on chest CT).  Denies chest pain.  Continue aspirin  81 mg daily, Crestor  5 mg daily.  Hypertension.  BP controlled.  Continue Coreg  12.5 mg twice daily, HCTZ 12.5 mg daily. Hyperlipidemia, cholesterol controlled, LDL at goal.  Continue Crestor  5 mg daily.  Follow-up in 1 year      Medication Adjustments/Labs and Tests Ordered: Current medicines are reviewed at length with the patient today.  Concerns regarding medicines are outlined above.  Orders Placed This Encounter  Procedures   ECHOCARDIOGRAM COMPLETE   No orders of the defined types were placed in this encounter.   Patient Instructions  Medication Instructions:  Your physician recommends that you continue on your current medications as directed. Please refer to the Current Medication list given to you today.   *If you need a refill on your cardiac medications before your next appointment, please call your pharmacy*  Lab Work: No labs ordered today  If you have labs (blood work) drawn today and your tests are completely normal, you will receive your results only by: MyChart Message (if you have MyChart) OR A paper copy in the mail If you have any lab test that is abnormal or we need to change your treatment, we will call you to review the results.  Testing/Procedures: Your physician has requested that you have an echocardiogram in 1 year. Echocardiography is a painless test that uses sound waves to create images of your heart. It provides your doctor with information about the size and shape of your heart and how well your heart's chambers and valves are working.   You may receive an ultrasound enhancing agent through an IV if needed to better visualize your heart during the echo. This  procedure takes approximately one hour.  There are no restrictions for this procedure.  This will take place at [address] [address], [place] [address]  Please note: We ask at that you not bring children with you during ultrasound (echo/ vascular) testing. Due to room size and safety concerns, children are not allowed in the ultrasound rooms during exams. Our front office staff cannot provide observation of children in our lobby area while testing is being conducted. An adult accompanying a patient to their appointment will only be allowed in the ultrasound room at the discretion of the ultrasound technician under special circumstances. We apologize for any inconvenience.   Follow-Up: At [facility], you and your health needs are our priority.  As part of our continuing mission to provide you with exceptional heart care, our providers are all part of one team.  This team includes your primary Cardiologist (physician) and Advanced Practice Providers or APPs (Physician Assistants and Nurse Practitioners) who all work together to provide you with the care you need, when you need it.  Your next appointment:   1 year(s)  Provider:   You may see Dr. Darliss or one of the following Advanced Practice Providers on your designated Care Team:   Lonni Meager, NP Lesley Maffucci, PA-C Bernardino Bring, PA-C Cadence [place], PA-C Tylene Lunch, NP Barnie Hila, NP    We recommend signing up for the patient portal called MyChart.  Sign up information is provided on this After Visit Summary.  MyChart is used to connect with patients for Virtual Visits (Telemedicine).  Patients are able to view lab/test results, encounter notes, upcoming appointments, etc.  Non-urgent messages can be sent to your provider as well.   To learn more about what you can do with MyChart, go to [email or web address].          Signed, Redell Darliss, MD  02/18/2024 10:23 AM    [facility]

## 2024-03-05 ENCOUNTER — Other Ambulatory Visit: Payer: Self-pay | Admitting: Family Medicine

## 2024-03-06 ENCOUNTER — Other Ambulatory Visit: Payer: Self-pay | Admitting: Family Medicine

## 2024-03-06 ENCOUNTER — Other Ambulatory Visit: Payer: Self-pay

## 2024-03-06 ENCOUNTER — Telehealth: Payer: Self-pay | Admitting: Family Medicine

## 2024-03-06 MED ORDER — ROSUVASTATIN CALCIUM 5 MG PO TABS: 5.0000 mg | ORAL_TABLET | Freq: Every day | ORAL | 0 refills | Status: DC

## 2024-03-06 NOTE — Telephone Encounter (Signed)
 Copied from CRM #[id number]. Topic: Clinical - Medication Refill >> Mar 06, 2024 11:05 AM Emylou G wrote: Medication: JANUVIA  100 MG tablet  Has the patient contacted their pharmacy? No (Agent: If no, request that the patient contact the pharmacy for the refill. If patient does not wish to contact the pharmacy document the reason why and proceed with request.) (Agent: If yes, when and what did the pharmacy advise?)  This is the patient's preferred pharmacy:  [id number] [place], [place] - [address] AT [facility] OF [address] [place] [address] Phone: [phone] Fax: [phone]    Is this the correct pharmacy for this prescription? Yes If no, delete pharmacy and type the correct one.   Has the prescription been filled recently? No  Is the patient out of the medication? Yes  Has the patient been seen for an appointment in the last year OR does the patient have an upcoming appointment? Yes  Can we respond through MyChart? Yes  Agent: Please be advised that Rx refills may take up to 3 business days. We ask that you follow-up with your pharmacy.

## 2024-03-06 NOTE — Telephone Encounter (Signed)
[facility] faxed refill request for the following medications: ? ?rosuvastatin (CRESTOR) 5 MG tablet  ? ?Please advise. ? ?

## 2024-03-07 ENCOUNTER — Other Ambulatory Visit: Payer: Self-pay | Admitting: Family Medicine

## 2024-03-09 NOTE — Telephone Encounter (Signed)
 Copied from CRM [id number]. Topic: Clinical - Medication Refill >> Mar 09, 2024  3:11 PM Olam RAMAN wrote: Medication: empagliflozin  (JARDIANCE ) 25 MG TABS tablet  Has the patient contacted their pharmacy? Yes (Agent: If no, request that the patient contact the pharmacy for the refill. If patient does not wish to contact the pharmacy document the reason why and proceed with request.) (Agent: If yes, when and what did the pharmacy advise?)  This is the patient's preferred pharmacy:  [id number] [place], [place] - [address] AT [facility] OF [address] [place] [address] Phone: [phone] Fax: [phone]  Is this the correct pharmacy for this prescription? Yes If no, delete pharmacy and type the correct one.   Has the prescription been filled recently? Yes  Is the patient out of the medication? No  Has the patient been seen for an appointment in the last year OR does the patient have an upcoming appointment? Yes  Can we respond through MyChart? Yes  Agent: Please be advised that Rx refills may take up to 3 business days. We ask that you follow-up with your pharmacy.

## 2024-03-09 NOTE — Telephone Encounter (Signed)
 Duplicate request, LRF 03/06/24 for 90 and 1 RF.E-Prescribing Status: Receipt confirmed by pharmacy (03/06/2024 10:49 AM EDT) .  Requested Prescriptions  Pending Prescriptions Disp Refills   sitaGLIPtin  (JANUVIA ) 100 MG tablet 90 tablet 1    Sig: Take 1 tablet (100 mg total) by mouth daily.     Endocrinology:  Diabetes - DPP-4 Inhibitors Failed - 03/09/2024 12:11 PM      Failed - HBA1C is between 0 and 7.9 and within 180 days    Hemoglobin A1C  Date Value Ref Range Status  12/25/2012 7.9 (H) 4.2 - 6.3 % Final    Comment:    The American Diabetes Association recommends that a primary goal of therapy should be <7% and that physicians should reevaluate the treatment regimen in patients with HbA1c values consistently >8%.    Hgb A1c MFr Bld  Date Value Ref Range Status  05/28/2023 10.2 (H) 4.8 - 5.6 % Final    Comment:             Prediabetes: 5.7 - 6.4          Diabetes: >6.4          Glycemic control for adults with diabetes: <7.0          Failed - Cr in normal range and within 360 days    Creatinine  Date Value Ref Range Status  12/26/2012 1.16 0.60 - 1.30 mg/dL Final   Creatinine, Ser  Date Value Ref Range Status  05/28/2023 1.20 (H) 0.57 - 1.00 mg/dL Final         Passed - Valid encounter within last 6 months    Recent Outpatient Visits           2 months ago Encounter for annual wellness visit (AWV) in Medicare patient   [facility] [place], Jon HERO, MD

## 2024-03-13 ENCOUNTER — Other Ambulatory Visit: Payer: Self-pay | Admitting: Family Medicine

## 2024-03-13 DIAGNOSIS — E1159 Type 2 diabetes mellitus with other circulatory complications: Secondary | ICD-10-CM

## 2024-03-13 NOTE — Telephone Encounter (Signed)
 Copied from CRM #[id number]. Topic: Clinical - Medication Refill >> Mar 13, 2024  2:42 PM Nathanel C wrote: Medication: empagliflozin  (JARDIANCE ) 25 MG TABS tablet JANUVIA  100 MG tablet (THIS WAS SENT [facility] TO [facility] AND IT NEEDS TO GO TO [facility]) PLEASE PULL BACK AND RESEND  Has the patient contacted their pharmacy? Yes   This is the patient's preferred pharmacy:  [id number] [place], [place] - [address] AT [facility] OF [address] [place] [address] Phone: [phone] Fax: [phone]  Is this the correct pharmacy for this prescription? Yes If no, delete pharmacy and type the correct one.   Has the prescription been filled recently? Yes  Is the patient out of the medication? Yes  Has the patient been seen for an appointment in the last year OR does the patient have an upcoming appointment? Yes  Can we respond through MyChart? No  Agent: Please be advised that Rx refills may take up to 3 business days. We ask that you follow-up with your pharmacy.

## 2024-03-16 MED ORDER — SITAGLIPTIN PHOSPHATE 100 MG PO TABS: 100.0000 mg | ORAL_TABLET | Freq: Every day | ORAL | 1 refills | Status: AC

## 2024-03-16 MED ORDER — EMPAGLIFLOZIN 25 MG PO TABS
25.0000 mg | ORAL_TABLET | Freq: Every day | ORAL | 1 refills | Status: AC
Start: 2024-03-16 — End: ?

## 2024-03-16 NOTE — Telephone Encounter (Signed)
 Requested medication (s) are due for refill today: yes  Requested medication (s) are on the active medication list: yes  Last refill:  08/13/23  Future visit scheduled: yes  Notes to clinic:  Unable to refill per protocol due to failed labs, no updated results.      Requested Prescriptions  Pending Prescriptions Disp Refills   empagliflozin  (JARDIANCE ) 25 MG TABS tablet 90 tablet 1    Sig: Take 1 tablet (25 mg total) by mouth daily. TAKE 1 TABLET(25 MG) BY MOUTH DAILY     Endocrinology:  Diabetes - SGLT2 Inhibitors Failed - 03/16/2024 11:01 AM      Failed - Cr in normal range and within 360 days    Creatinine  Date Value Ref Range Status  12/26/2012 1.16 0.60 - 1.30 mg/dL Final   Creatinine, Ser  Date Value Ref Range Status  05/28/2023 1.20 (H) 0.57 - 1.00 mg/dL Final         Failed - HBA1C is between 0 and 7.9 and within 180 days    Hemoglobin A1C  Date Value Ref Range Status  12/25/2012 7.9 (H) 4.2 - 6.3 % Final    Comment:    The American Diabetes Association recommends that a primary goal of therapy should be <7% and that physicians should reevaluate the treatment regimen in patients with HbA1c values consistently >8%.    Hgb A1c MFr Bld  Date Value Ref Range Status  05/28/2023 10.2 (H) 4.8 - 5.6 % Final    Comment:             Prediabetes: 5.7 - 6.4          Diabetes: >6.4          Glycemic control for adults with diabetes: <7.0          Failed - eGFR in normal range and within 360 days    EGFR (African American)  Date Value Ref Range Status  12/26/2012 56 (L)  Final   GFR calc Af Amer  Date Value Ref Range Status  08/02/2020 45 (L) >59 mL/min/1.73 Final    Comment:    **In accordance with recommendations from the NKF-ASN Task force,**   [facility] is in the process of updating its eGFR calculation to the   2021 CKD-EPI creatinine equation that estimates kidney function   without a race variable.    EGFR (Non-African Amer.)  Date Value Ref Range Status   12/26/2012 48 (L)  Final    Comment:    eGFR values <62mL/min/1.73 m2 may be an indication of chronic kidney disease (CKD). Calculated eGFR is useful in patients with stable renal function. The eGFR calculation will not be reliable in acutely ill patients when serum creatinine is changing rapidly. It is not useful in  patients on dialysis. The eGFR calculation may not be applicable to patients at the low and high extremes of body sizes, pregnant women, and vegetarians. CALCIUM  - RESULTS VERIFIED BY REPEAT TESTING.  - NOTIFIED OF CRITICAL VALUE  - C/BRANDY KELLY AT 0715 12/26/12-LAB  - READ-BACK PROCESS PERFORMED.    GFR, Estimated  Date Value Ref Range Status  06/20/2022 >60 >60 mL/min Final    Comment:    (NOTE) Calculated using the CKD-EPI Creatinine Equation (2021)    eGFR  Date Value Ref Range Status  05/28/2023 46 (L) >59 mL/min/1.73 Final         Passed - Valid encounter within last 6 months    Recent Outpatient Visits  2 months ago Encounter for annual wellness visit (AWV) in Medicare patient   [facility] [place], Jon HERO, MD               sitaGLIPtin  (JANUVIA ) 100 MG tablet 90 tablet 1    Sig: Take 1 tablet (100 mg total) by mouth daily.     Endocrinology:  Diabetes - DPP-4 Inhibitors Failed - 03/16/2024 11:01 AM      Failed - HBA1C is between 0 and 7.9 and within 180 days    Hemoglobin A1C  Date Value Ref Range Status  12/25/2012 7.9 (H) 4.2 - 6.3 % Final    Comment:    The American Diabetes Association recommends that a primary goal of therapy should be <7% and that physicians should reevaluate the treatment regimen in patients with HbA1c values consistently >8%.    Hgb A1c MFr Bld  Date Value Ref Range Status  05/28/2023 10.2 (H) 4.8 - 5.6 % Final    Comment:             Prediabetes: 5.7 - 6.4          Diabetes: >6.4          Glycemic control for adults with diabetes: <7.0          Failed - Cr in  normal range and within 360 days    Creatinine  Date Value Ref Range Status  12/26/2012 1.16 0.60 - 1.30 mg/dL Final   Creatinine, Ser  Date Value Ref Range Status  05/28/2023 1.20 (H) 0.57 - 1.00 mg/dL Final         Passed - Valid encounter within last 6 months    Recent Outpatient Visits           2 months ago Encounter for annual wellness visit (AWV) in Medicare patient   [facility] [place], Jon HERO, MD

## 2024-03-24 ENCOUNTER — Other Ambulatory Visit: Payer: Self-pay | Admitting: Cardiology

## 2024-03-24 ENCOUNTER — Telehealth: Payer: Self-pay | Admitting: Family Medicine

## 2024-03-24 ENCOUNTER — Other Ambulatory Visit: Payer: Self-pay

## 2024-03-24 NOTE — Telephone Encounter (Signed)
 [facility] faxed refill request for the following medications:  rosuvastatin  (CRESTOR ) 5 MG tablet (90 day supply)   Please advise.

## 2024-03-24 NOTE — Telephone Encounter (Signed)
 Converted

## 2024-03-25 ENCOUNTER — Telehealth: Payer: Self-pay | Admitting: Family Medicine

## 2024-03-25 ENCOUNTER — Other Ambulatory Visit: Payer: Self-pay | Admitting: Family Medicine

## 2024-03-25 DIAGNOSIS — E1165 Type 2 diabetes mellitus with hyperglycemia: Secondary | ICD-10-CM

## 2024-03-25 NOTE — Telephone Encounter (Signed)
 [facility] faxed refill request for the following medications:  Lantus  Solostar pen inj    Please advise.

## 2024-03-25 NOTE — Telephone Encounter (Signed)
 Copied from CRM [id number]. Topic: Clinical - Medication Refill >> Mar 25, 2024  3:01 PM Everette C wrote: Medication: Insulin  Glargine (BASAGLAR  KWIKPEN) 100 UNIT/ML  Accu-Chek FastClix Lancets MISC  Has the patient contacted their pharmacy? Yes (Agent: If no, request that the patient contact the pharmacy for the refill. If patient does not wish to contact the pharmacy document the reason why and proceed with request.) (Agent: If yes, when and what did the pharmacy advise?)  This is the patient's preferred pharmacy:  [id number] [place], [place] - [address] AT [facility] OF [address] [place] [address] Phone: [phone] Fax: [phone]  Is this the correct pharmacy for this prescription? Yes If no, delete pharmacy and type the correct one.   Has the prescription been filled recently? Yes  Is the patient out of the medication? Yes  Has the patient been seen for an appointment in the last year OR does the patient have an upcoming appointment? Yes  Can we respond through MyChart? No  Agent: Please be advised that Rx refills may take up to 3 business days. We ask that you follow-up with your pharmacy.

## 2024-03-26 MED ORDER — BASAGLAR KWIKPEN 100 UNIT/ML ~~LOC~~ SOPN: 30.0000 [IU] | PEN_INJECTOR | Freq: Every day | SUBCUTANEOUS | 2 refills | Status: AC

## 2024-03-26 MED ORDER — ACCU-CHEK FASTCLIX LANCETS MISC: 1 refills | Status: DC

## 2024-03-26 NOTE — Telephone Encounter (Signed)
 Her long acting insulin  is basaglar , not Lantus  now.  We can refill the basaglar  if she needs a refill of that

## 2024-03-26 NOTE — Telephone Encounter (Signed)
 Patient daughter made aware and verbalized understanding. Rx sent

## 2024-03-27 NOTE — Telephone Encounter (Signed)
 Requested Prescriptions  Pending Prescriptions Disp Refills   Accu-Chek FastClix Lancets MISC 100 each 1    Sig: Use as directed to check blood glucose twice daily     Endocrinology: Diabetes - Testing Supplies Passed - 03/27/2024 12:04 PM      Passed - Valid encounter within last 12 months    Recent Outpatient Visits           2 months ago Encounter for annual wellness visit (AWV) in Medicare patient   [facility] [place], Jon HERO, MD

## 2024-04-03 NOTE — Telephone Encounter (Unsigned)
 Copied from CRM [id number]. Topic: Clinical - Medication Refill >> Apr 03, 2024  1:05 PM Lonell PEDLAR wrote: Medication: Accu-Chek FastClix Lancets MISC  Has the patient contacted their pharmacy? Yes, need approval from office  This is the patient's preferred pharmacy:  [id number] [place], [place] - [address] AT [facility] [address] [place] [address] Phone: [phone] Fax: [phone]  Is this the correct pharmacy for this prescription? Yes If no, delete pharmacy and type the correct one.   Has the prescription been filled recently? Yes  Is the patient out of the medication? Yes  Has the patient been seen for an appointment in the last year OR does the patient have an upcoming appointment? Yes  Can we respond through MyChart? No  Agent: Please be advised that Rx refills may take up to 3 business days. We ask that you follow-up with your pharmacy.

## 2024-04-17 NOTE — Telephone Encounter (Signed)
 Pt's daughter Kip called to report that she has been out more than 3 weeks. Manisha says she just contacted the pharmacy and was told that the Rx is not there. Please advise   Best contact: [phone]

## 2024-04-20 ENCOUNTER — Other Ambulatory Visit: Payer: Self-pay

## 2024-04-20 DIAGNOSIS — E1165 Type 2 diabetes mellitus with hyperglycemia: Secondary | ICD-10-CM

## 2024-04-20 MED ORDER — ACCU-CHEK FASTCLIX LANCETS MISC: 1 refills | Status: DC

## 2024-04-20 NOTE — Telephone Encounter (Signed)
 Patients daughter reports it is lancets that they are out of. Advised rx was sent 03/26/24. She said pharmacy has not received. Rx resent

## 2024-04-30 ENCOUNTER — Encounter: Payer: Self-pay | Admitting: Internal Medicine

## 2024-04-30 ENCOUNTER — Ambulatory Visit: Admitting: Internal Medicine

## 2024-04-30 ENCOUNTER — Ambulatory Visit: Payer: Self-pay

## 2024-04-30 VITALS — BP 120/80 | HR 81 | Temp 98.2°F | Ht 61.0 in | Wt 150.0 lb

## 2024-04-30 DIAGNOSIS — J4550 Severe persistent asthma, uncomplicated: Secondary | ICD-10-CM

## 2024-04-30 DIAGNOSIS — G4733 Obstructive sleep apnea (adult) (pediatric): Secondary | ICD-10-CM | POA: Diagnosis not present

## 2024-04-30 DIAGNOSIS — E1159 Type 2 diabetes mellitus with other circulatory complications: Secondary | ICD-10-CM

## 2024-04-30 DIAGNOSIS — R0602 Shortness of breath: Secondary | ICD-10-CM

## 2024-04-30 DIAGNOSIS — J4540 Moderate persistent asthma, uncomplicated: Secondary | ICD-10-CM

## 2024-04-30 NOTE — Patient Instructions (Signed)
 Excellent Job A+ GOLD STAR!!  Continue CPAP as prescribed  Patient Instructions Continue to use CPAP every night, minimum of 4-6 hours a night.  Change equipment every 30 days or as directed by DME.  Wash your tubing with warm soap and water daily, hang to dry. Wash humidifier portion weekly. Use bottled, distilled water and change daily   Be aware of reduced alertness and do not drive or operate heavy machinery if experiencing this or drowsiness.  Exercise encouraged, as tolerated. Encouraged proper weight management.  Important to get eight or more hours of sleep  Limiting the use of the computer and television before bedtime.  Decrease naps during the day, so night time sleep will become enhanced.  Limit caffeine, and sleep deprivation.    Avoid Allergens and Irritants Avoid secondhand smoke Avoid SICK contacts Recommend  Masking  when appropriate Recommend Keep up-to-date with vaccinations  Continue nebulizers as prescribed

## 2024-04-30 NOTE — Progress Notes (Signed)
 [facility]  Date: 04/30/2024  MRN# [id number] Whitney Fleming Aug 05, 1945  SYNOPSIS Whitney Fleming is a 78 y.o. female  with severe allergic asthma.  She speaks [place] her daughter translates.  She has had 3 admissions to the hospital in 2019 with asthma exacerbations including most recently in September 2019.  She had not had further hospital admissions thus far this year but required 2 courses of prednisone  in May 2020 which caused her blood sugar to spike.   She previously had been evaluated for an injectable biologic agent for asthma but declined because she did not want injections. She had started MAINTENANCE INHALED NEB THERAPY  We previously considered her for Nucala  but PATIENT declined anything that involve injections.    **IgE 12/24/17; IGe 5.  **CXR 11/05/17; changes of chronic bronchitis, right heart enlargement.  **CBC 02/11/18>> absolute eosinophil count 800. **CBC 09/25/17; Abs eos count 300.    CC Assessment of asthma Assessment of OSA  HPI Chronic shortness of breath and dyspnea on exertion It seems that there is significant improvement of her symptoms with maintenance of nebulized therapy Respiratory insufficiency-patient could no longer use traditional inhaler therapy Patient tolerating current nebulizer treatment  Most of her history is provided by the daughter No exacerbation at this time No evidence of heart failure at this time No evidence or signs of infection at this time No respiratory distress No fevers, chills, nausea, vomiting, diarrhea No evidence of lower extremity edema No evidence hemoptysis   Sleep study reviewed in detail with patient patient has severe sleep apnea with AHI of 39  Discussed sleep data and reviewed with patient.  Patient uses and benefits from therapy Using CPAP nightly and with naps Pressure setting is comfortable and is sleeping well. 100% percent compliance AHI reduced to 1 AUTO CPAP 4-12 Seems that  pressure may be a little too much we will decrease auto CPAP 4-8, we will increase humidity setting as well   Medication:    Current Outpatient Medications:    Accu-Chek FastClix Lancets MISC, Use as directed to check blood glucose twice daily, Disp: 100 each, Rfl: 1   AMBULATORY NON FORMULARY MEDICATION, Medication Name: Aero chamber use as directed. DX:J45.909, Disp: 1 each, Rfl: 0   arformoterol  (BROVANA ) 15 MCG/2ML NEBU, Take 2 mLs (15 mcg total) by nebulization in the morning and at bedtime., Disp: 360 mL, Rfl: 5   aspirin  EC 81 MG tablet, Take 1 tablet (81 mg total) by mouth daily. Swallow whole., Disp: 90 tablet, Rfl: 3   budesonide  (PULMICORT ) 0.5 MG/2ML nebulizer solution, Take 2 mLs (0.5 mg total) by nebulization in the morning and at bedtime., Disp: 360 mL, Rfl: 3   carvedilol  (COREG ) 12.5 MG tablet, TAKE 1 TABLET(12.5 MG) BY MOUTH TWICE DAILY WITH A MEAL, Disp: 180 tablet, Rfl: 0   empagliflozin  (JARDIANCE ) 25 MG TABS tablet, Take 1 tablet (25 mg total) by mouth daily. TAKE 1 TABLET(25 MG) BY MOUTH DAILY, Disp: 90 tablet, Rfl: 1   famotidine  (PEPCID ) 20 MG tablet, Take 1 tablet (20 mg total) by mouth 2 (two) times daily., Disp: 180 tablet, Rfl: 1   furosemide  (LASIX ) 20 MG tablet, Take 20 mg by mouth daily., Disp: , Rfl:    glucose blood (ACCU-CHEK GUIDE) test strip, USE  STRIP TO CHECK GLUCOSE TWICE DAILY AS DIRECTEDUSE  STRIP TO CHECK GLUCOSE TWICE DAILY AS DIRECTED, Disp: 200 each, Rfl: 3   Glucose Blood (BLOOD GLUCOSE TEST STRIPS 333) STRP, Use as directed to check  blood glucose three to four times daily, Disp: 100 strip, Rfl: 11   hydrochlorothiazide  (HYDRODIURIL ) 12.5 MG tablet, Take 1 tablet (12.5 mg total) by mouth daily., Disp: 30 tablet, Rfl: 2   Insulin  Glargine (BASAGLAR  KWIKPEN) 100 UNIT/ML, Inject 30 Units into the skin daily., Disp: 15 mL, Rfl: 2   Insulin  Pen Needle (BD PEN NEEDLE NANO U/F) 32G X 4 MM MISC, Use as directed to inject insulin , Disp: 100 each, Rfl: 3    ipratropium-albuterol  (DUONEB) 0.5-2.5 (3) MG/3ML SOLN, Take 3 mLs by nebulization every 8 (eight) hours as needed., Disp: 270 mL, Rfl: 11   metFORMIN  (GLUCOPHAGE ) 1000 MG tablet, TAKE 1 TABLET BY MOUTH TWICE DAILY WITH A MEAL, Disp: 180 tablet, Rfl: 1   montelukast  (SINGULAIR ) 10 MG tablet, Take 1 tablet (10 mg total) by mouth at bedtime., Disp: 90 tablet, Rfl: 3   pantoprazole  (PROTONIX ) 40 MG tablet, Take 1 tablet (40 mg total) by mouth daily., Disp: 30 tablet, Rfl: 11   predniSONE  (DELTASONE ) 10 MG tablet, Take 1 tablet (10 mg total) by mouth daily with breakfast., Disp: 7 tablet, Rfl: 0   rosuvastatin  (CRESTOR ) 5 MG tablet, Take 1 tablet (5 mg total) by mouth daily., Disp: 30 tablet, Rfl: 0   sitaGLIPtin  (JANUVIA ) 100 MG tablet, Take 1 tablet (100 mg total) by mouth daily., Disp: 90 tablet, Rfl: 1   Vitamin D , Ergocalciferol , (DRISDOL ) 1.25 MG (50000 UNIT) CAPS capsule, Take 1 capsule (50,000 Units total) by mouth every 7 (seven) days., Disp: 12 capsule, Rfl: 1   Allergies:  Patient has no known allergies.  BP 120/80   Pulse 81   Temp 98.2 F (36.8 C)   Ht 5' 1 (1.549 m)   Wt 150 lb (68 kg)   SpO2 100%   BMI 28.34 kg/m    Review of Systems  Constitutional: Negative.   Respiratory:  Positive for shortness of breath.   Cardiovascular: Negative.   Neurological:  Positive for tingling.  Left hand likely due to spinal stenosis Physical Exam Constitutional:      Appearance: Normal appearance.  Cardiovascular:     Rate and Rhythm: Normal rate and regular rhythm.     Pulses: Normal pulses.     Heart sounds: Normal heart sounds.  Pulmonary:     Effort: Pulmonary effort is normal.     Breath sounds: Normal breath sounds.  Skin:    Capillary Refill: Capillary refill takes less than 2 seconds.  Neurological:     Mental Status: She is alert.       Assessment and Plan:  78 year old female with chronic severe persistent asthma currently on Pulmicort  and Brovana  nebulizers  with respiratory insufficiency unable to take traditional inhaler therapy for chronic severe persistent asthma in the setting of coronary artery disease and diastolic heart failure with a diagnosis of severe sleep apnea    Excessive daytime sleepiness signs of OSA Severe OSA with AHI of 40 Continue CPAP as prescribed  Excellent compliance report Reviewed compliance report in detail with patient Patient definitely benefits the use of CPAP therapy as prescribed Using CPAP nightly and with naps Pressure setting is comfortable and is sleeping well. CPAP prescription 4-12 AHI reduced to 1  No evidence of acute heart failure at this time No respiratory distress No fevers, chills, nausea, vomiting, diarrhea No evidence hemoptysis  Patient Instructions Continue to use CPAP every night, minimum of 4-6 hours a night.  Change equipment every 30 days or as directed by DME.  Wash your  tubing with warm soap and water daily, hang to dry.  Wash humidifier portion weekly. Use bottled, distilled water and change daily  Risk of untreated sleep apnea including cardiac arrhthymias, stroke, DM, pulm HTN.     Chronic severe persistent asthma - no significant respiratory compromise Continue nebulized therapy as prescribed  Pulmicort  twice daily and Brovana  twice daily DuoNebs as needed No indication for antibiotics or prednisone  at this time No exacerbation at this time  shortness of breath Multifactorial etiologies which include obesity underlying lung disease and undiagnosed CAD with diastolic heart failure Follow-up echocardiogram results with cardiology It seems that patient has more exercise capacity at this time  MEDICATION ADJUSTMENTS/LABS AND TESTS ORDERED: Continue nebulized therapy as maintenance therapy Continue Pulmicort  nebs twice daily Continue Brovana  nebs twice daily DuoNebs as needed Continue CPAP as prescribed   CURRENT MEDICATIONS REVIEWED AT LENGTH WITH PATIENT  TODAY   Patient  satisfied with Plan of action and management. All questions answered   Follow up 6 months   I spent a total of 45 minutes dedicated to the care of this patient on the date of this encounter to include pre-visit review of records, face-to-face time with the patient discussing conditions above, post visit ordering of testing, clinical documentation with the electronic health record, making appropriate referrals as documented, and communicating necessary information to the patient's healthcare team.    The Patient requires high complexity decision making for assessment and support, frequent evaluation and titration of therapies, application of advanced monitoring technologies and extensive interpretation of multiple databases.  Patient satisfied with Plan of action and management. All questions answered    Nickolas Alm Cellar, M.D.  [facility]  Medical Director [facility]

## 2024-04-30 NOTE — Telephone Encounter (Signed)
 FYI Only or Action Required?: Action required by provider: device replacement needed.  Patient was last seen in primary care on 01/02/2024 by Myrla Jon HERO, MD.  Called Nurse Triage reporting Blood Sugar Problem.  Symptoms began today.  Symptoms are: no symptoms reported.  Triage Disposition: Call PCP Now  Patient/caregiver understands and will follow disposition?: Yes     Copied from CRM [id number]. Topic: Clinical - Red Word Triage >> Apr 30, 2024  3:55 PM Charlet HERO wrote: Red Word that prompted transfer to Nurse Triage: the patient acc check pass click is not working. [place]      Reason for Disposition  Nursing judgment or information in reference  Answer Assessment - Initial Assessment Questions Patient's daughter states the patient doesn't have any symptoms at this time and needs a replacement for her Accu-check device. Please advise.     1. REASON FOR CALL: What is your main concern right now?     Accu-check device not working  2. ONSET: When did the device stop working?     Today but hasn't had lancets for 2-3 weeks  Protocols used: No Guideline Available-A-AH

## 2024-05-01 MED ORDER — BLOOD GLUCOSE MONITORING SUPPL DEVI: 1 refills | Status: DC

## 2024-05-01 MED ORDER — BLOOD GLUCOSE MONITORING SUPPL DEVI: 1 refills | Status: AC

## 2024-05-01 NOTE — Addendum Note (Signed)
 Addended by: CHERRY CHIQUITA HERO on: 05/01/2024 04:10 PM   Modules accepted: Orders

## 2024-05-28 ENCOUNTER — Other Ambulatory Visit: Payer: Self-pay

## 2024-05-28 ENCOUNTER — Telehealth: Payer: Self-pay | Admitting: Family Medicine

## 2024-05-28 DIAGNOSIS — J4550 Severe persistent asthma, uncomplicated: Secondary | ICD-10-CM

## 2024-05-28 MED ORDER — ARFORMOTEROL TARTRATE 15 MCG/2ML IN NEBU: 15.0000 ug | INHALATION_SOLUTION | Freq: Two times a day (BID) | RESPIRATORY_TRACT | 5 refills | Status: AC

## 2024-05-28 MED ORDER — ROSUVASTATIN CALCIUM 5 MG PO TABS: 5.0000 mg | ORAL_TABLET | Freq: Every day | ORAL | 0 refills | Status: AC

## 2024-05-28 NOTE — Telephone Encounter (Signed)
 Refill Request   Pharmacy: [facility]  Medication: Rosuvastatin  5mg   Quantity (if provided): 30   Please send in refill request.

## 2024-06-02 ENCOUNTER — Telehealth: Payer: Self-pay

## 2024-06-02 ENCOUNTER — Other Ambulatory Visit: Payer: Self-pay

## 2024-06-02 DIAGNOSIS — E1159 Type 2 diabetes mellitus with other circulatory complications: Secondary | ICD-10-CM

## 2024-06-02 DIAGNOSIS — J4550 Severe persistent asthma, uncomplicated: Secondary | ICD-10-CM

## 2024-06-02 DIAGNOSIS — E1165 Type 2 diabetes mellitus with hyperglycemia: Secondary | ICD-10-CM

## 2024-06-02 MED ORDER — ACCU-CHEK FASTCLIX LANCETS MISC: 3 refills | Status: AC

## 2024-06-02 MED ORDER — BUDESONIDE 0.5 MG/2ML IN SUSP: 0.5000 mg | Freq: Two times a day (BID) | RESPIRATORY_TRACT | 3 refills | Status: DC

## 2024-06-02 MED ORDER — BLOOD GLUCOSE TEST STRIPS 333 VI STRP: ORAL_STRIP | 11 refills | Status: AC

## 2024-06-02 NOTE — Telephone Encounter (Signed)
 Copied from CRM [id number]. Topic: Clinical - Prescription Issue >> Jun 02, 2024  3:14 PM Shanda MATSU wrote: Reason for CRM: Patient's daughter, Maresha Anastos, called in stating that wrong blood glucose monitoring machine was sent to pharmacy, caller wants to know if machine that does not require pricking of the patient's finger can be prescribed, is req a call back in regards to this.

## 2024-06-15 ENCOUNTER — Other Ambulatory Visit: Payer: Self-pay

## 2024-06-15 DIAGNOSIS — R0609 Other forms of dyspnea: Secondary | ICD-10-CM

## 2024-06-15 DIAGNOSIS — J411 Mucopurulent chronic bronchitis: Secondary | ICD-10-CM

## 2024-06-15 MED ORDER — BUDESONIDE 0.5 MG/2ML IN SUSP: 0.5000 mg | Freq: Two times a day (BID) | RESPIRATORY_TRACT | 3 refills | Status: AC

## 2024-10-06 ENCOUNTER — Ambulatory Visit: Admitting: Internal Medicine
